# Patient Record
Sex: Male | Born: 1960 | Race: White | Hispanic: No | Marital: Married | Smoking: Former smoker
Health system: Southern US, Community
[De-identification: ages and names within clinical notes are randomized; demographics above are authoritative.]

## PROBLEM LIST (undated history)

## (undated) DIAGNOSIS — E781 Pure hyperglyceridemia: Secondary | ICD-10-CM

## (undated) DIAGNOSIS — N2889 Other specified disorders of kidney and ureter: Secondary | ICD-10-CM

## (undated) DIAGNOSIS — K219 Gastro-esophageal reflux disease without esophagitis: Secondary | ICD-10-CM

## (undated) DIAGNOSIS — R7303 Prediabetes: Secondary | ICD-10-CM

## (undated) DIAGNOSIS — R002 Palpitations: Secondary | ICD-10-CM

## (undated) DIAGNOSIS — I10 Essential (primary) hypertension: Secondary | ICD-10-CM

## (undated) DIAGNOSIS — I70213 Atherosclerosis of native arteries of extremities with intermittent claudication, bilateral legs: Secondary | ICD-10-CM

## (undated) DIAGNOSIS — E118 Type 2 diabetes mellitus with unspecified complications: Secondary | ICD-10-CM

## (undated) DIAGNOSIS — N200 Calculus of kidney: Secondary | ICD-10-CM

## (undated) DIAGNOSIS — E041 Nontoxic single thyroid nodule: Secondary | ICD-10-CM

## (undated) DIAGNOSIS — D649 Anemia, unspecified: Secondary | ICD-10-CM

## (undated) DIAGNOSIS — N189 Chronic kidney disease, unspecified: Secondary | ICD-10-CM

## (undated) DIAGNOSIS — C801 Malignant (primary) neoplasm, unspecified: Secondary | ICD-10-CM

## (undated) DIAGNOSIS — I739 Peripheral vascular disease, unspecified: Secondary | ICD-10-CM

## (undated) DIAGNOSIS — Z9889 Other specified postprocedural states: Secondary | ICD-10-CM

## (undated) DIAGNOSIS — N183 Chronic kidney disease, stage 3 unspecified: Secondary | ICD-10-CM

## (undated) DIAGNOSIS — J439 Emphysema, unspecified: Secondary | ICD-10-CM

## (undated) DIAGNOSIS — Z87442 Personal history of urinary calculi: Secondary | ICD-10-CM

## (undated) DIAGNOSIS — J449 Chronic obstructive pulmonary disease, unspecified: Secondary | ICD-10-CM

## (undated) DIAGNOSIS — N289 Disorder of kidney and ureter, unspecified: Secondary | ICD-10-CM

## (undated) DIAGNOSIS — I6523 Occlusion and stenosis of bilateral carotid arteries: Secondary | ICD-10-CM

## (undated) DIAGNOSIS — I499 Cardiac arrhythmia, unspecified: Secondary | ICD-10-CM

## (undated) DIAGNOSIS — I2510 Atherosclerotic heart disease of native coronary artery without angina pectoris: Secondary | ICD-10-CM

## (undated) HISTORY — PX: OTHER SURGICAL HISTORY: SHX169

## (undated) HISTORY — PX: HERNIA REPAIR: SHX51

## (undated) HISTORY — DX: Pure hyperglyceridemia: E78.1

## (undated) HISTORY — DX: Palpitations: R00.2

---

## 2006-10-14 ENCOUNTER — Emergency Department: Payer: Self-pay

## 2006-10-14 ENCOUNTER — Other Ambulatory Visit: Payer: Self-pay

## 2006-10-21 ENCOUNTER — Ambulatory Visit: Payer: Self-pay | Admitting: Cardiology

## 2006-10-27 ENCOUNTER — Ambulatory Visit: Payer: Self-pay

## 2006-11-09 ENCOUNTER — Ambulatory Visit: Payer: Self-pay

## 2006-11-26 ENCOUNTER — Ambulatory Visit: Payer: Self-pay | Admitting: Cardiology

## 2008-09-13 DIAGNOSIS — R079 Chest pain, unspecified: Secondary | ICD-10-CM | POA: Insufficient documentation

## 2008-09-13 DIAGNOSIS — E781 Pure hyperglyceridemia: Secondary | ICD-10-CM | POA: Insufficient documentation

## 2010-07-24 ENCOUNTER — Ambulatory Visit: Payer: Self-pay | Admitting: Gastroenterology

## 2010-07-27 LAB — PATHOLOGY REPORT

## 2010-08-19 NOTE — Assessment & Plan Note (Signed)
[facility] OFFICE NOTE   [name], [name]                    MRN:          [id number]  DATE:10/21/2006                            DOB:          01/29/1961    REFERRING PHYSICIAN:  Noelle McLaurin   REASON FOR CONSULTATION:  Evaluate a patient with chest discomfort and  multiple cardiovascular risk factors.   HISTORY OF PRESENT ILLNESS:  The patient is a pleasant 50 year old  gentleman with no prior cardiac history.  He said he had chest pain some  years ago and had a negative exercise treadmill test.  However, he has  been getting chest discomfort now over the last 2-3 weeks.  He describes  this as a discomfort that can be in his left chest, left shoulder or  left arm.  It happens frequently throughout the day.  It is about every  half hour or sometimes less frequently.  He describes it as a mild to  moderate discomfort.  It is somewhat of a heaviness.  It may last for a  few to several seconds.  It seems to be worse with physical activity  such as yard work.  He does have it at rest.  He does not have any  associated symptoms such as nausea, vomiting, or diaphoresis.  He has  not noticed any palpitations.  He did present to the [facility] Emergency  Room where cardiac enzymes were negative.  An EKG was nonacute.  Chest x-  ray was unremarkable.  He was referred here for further evaluation.  The  patient does not exercise.  He has been a long-standing smoker.  He does  do some yard work and has the symptoms as above.  He has not noticed any  change in this.  He has no new shortness of breath, though he has some  chronic dyspnea with moderate exertion.  He has no PND or orthopnea.   PAST MEDICAL HISTORY:  Borderline hypertension.  He has not been told he  has diabetes and he has no known dyslipidemia.   PAST SURGICAL HISTORY:  None.   ALLERGIES:  None.   MEDICATIONS:  Aspirin 325 mg daily.   SOCIAL  HISTORY:  He is from [place] former [employer] (medical).  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  He has been a cigarette smoker, a pack per day  for 30 years.  He does drink 3-4 alcoholic beverages per day and 5-6  caffeinated drinks.   FAMILY HISTORY:  Noncontributory for early coronary disease.  His mother  possibly had heart failure dying at age 68.  He does not know what his  father died from.  He has no brothers or sisters.   REVIEW OF SYSTEMS:  As stated in the HPI and otherwise negative for all  other systems.   PHYSICAL EXAMINATION:  GENERAL:  The patient is in no distress.  VITAL SIGNS:  Blood pressure 138/84, heart rate 68 and regular, weight  181 pounds.  HEENT:  Eyelids unremarkable.  Pupils are equal,  round, and reactive to  light.  Fundi not visualized.  Oral mucosa unremarkable.  NECK:  No jugular venous distention.  Wave form within normal limits.  Carotid upstroke brisk and symmetric.  No bruits.  No thyromegaly.  LYMPHATICS:  No cervical, axillary, or inguinal adenopathy.  LUNGS:  Clear to auscultation bilaterally.  BACK:  No costovertebral angle tenderness.  CHEST:  Unremarkable.  HEART:  PMI not displaced or sustained.  S1 and S2 within normal limits.  No S3, no S4, no clicks, no rubs, no murmurs.  ABDOMEN:  Flat.  Positive bowel sounds, normal in frequency and pitch.  No bruits.  No rebound.  No guarding.  No midline pulsatile mass.  No  hepatomegaly.  No splenomegaly.  SKIN:  No rashes.  No nodules.  EXTREMITIES:  Pulses 2+ throughout.  No edema.  No cyanosis.  No  clubbing.  NEUROLOGIC:  Oriented to person, place and time.  Cranial nerves II-XII  grossly intact.  Motor grossly intact.   EKG (done in the emergency room) sinus rhythm, rate 74, axis within  normal limits, intervals within normal limits, minimal voltage criteria  for left ventricular hypertrophy, no acute ST-T wave changes.    ASSESSMENT/PLAN:  1. Chest discomfort.  The patient's chest discomfort is somewhat      atypical.  However, it does get worse with exertion.  He has      significant cardiovascular risk factor with his smoking history and      sedentary lifestyle.  Given this, the pretest probability of      obstructive coronary artery disease is moderate at least.  We will      go ahead and do an exercise Cardiolite.  I had a long discussion      with him about the fact that he almost definitely has      nonobstructive plaque and would be at increased risk from the      Framingham score.  We are going to address this as below.  2. Risk reduction.  I will check a lipid profile, C reactive protein,      and a homocystine level.  We will treat this according to      guidelines and perhaps more aggressively based on a mutual      agreement.  3. Tobacco.  I discussed with him therapies to quit smoking.  He is      going to try nicotine gum.  We will give him the telephone number      for [facility].  4. Sedentary lifestyle.  I will be able to prescribe an exercise      regimen for him at future appointments and based on his results on      the stress perfusion study.  5. Followup.  I would like to see him back in about 6 months to see if      he has made any progress with risk reduction.    Rollene Rotunda, MD, [facility]  Electronically Signed   JH/MedQ  DD: 10/21/2006  DT: 10/21/2006  Job #: [id number]   cc:   Maurilio Lovely, Dr.

## 2010-08-19 NOTE — Assessment & Plan Note (Signed)
[facility]                            CARDIOLOGY OFFICE NOTE   [name] [name], [name] [name]                    MRN:          [id number]  DATE:11/26/2006                            DOB:          1960-10-01    PRIMARY CARE PHYSICIAN:  None.   REASON FOR PRESENTATION:  Evaluate patient with chest discomfort.   HISTORY OF PRESENT ILLNESS:  The patient is a 50 year old gentleman who  came in with chest discomfort as described in the July 17 note.  Following that, he did have a stress perfusion study which demonstrated  no evidence of ischemia or infarct.  However, his ejection fraction was  32%.  Subsequently we ordered an echocardiogram which suggested his EF  was 55% with no wall motion abnormalities and no significant valvular  abnormalities.  Since that time, the patient has had a resolution of his  chest discomfort.  He may get some fleeting episodes now and again, but  it is not as severe as it was at the time of his ER presentation.  He  has been mowing the lawn and doing yard work and not getting any of  these symptoms.  He did have his labs drawn and has significant  hypertriglyceridemia with an HDL of 45 and triglycerides of 431.  Total  cholesterol was 238.  His homocysteine level was upper limits of normal.  C-reactive protein was not elevated.   PAST MEDICAL HISTORY:  Borderline hypertension.   ALLERGIES:  None.   MEDICATIONS:  Aspirin.   REVIEW OF SYSTEMS:  As stated in the HPI, otherwise negative for other  systems.   PHYSICAL EXAMINATION:  GENERAL:  The patient is in no distress.  VITAL SIGNS:  Blood pressure 138/68, heart rate 68 and regular.  Weight  178 pounds.  NECK:  No jugular venous distention at 45 degrees. Carotid upstroke  brisk and symmetric.  No bruits, no thyromegaly.  LYMPHATICS:  No nodules.  LUNGS: Clear to auscultation bilaterally.  BACK:  No costovertebral angle tenderness.  CHEST:  Unremarkable.  HEART:  PMI not  displaced or sustained.  S1 and S2 within normal limits.  No S3, no S4, click, rubs, murmurs.  ABDOMEN:  Obese.  Positive bowel sounds, normal in frequency and pitch.  No bruits, rebound, guarding, midline pulsatile mass, or organomegaly.  SKIN:  No rashes.  EXTREMITIES:  2+ pulses, no edema.   ASSESSMENT AND PLAN:  1. Chest pain.  The patient had a negative stress perfusion study.      The symptoms are gone.  At this point, no further cardiovascular      testing is warranted.  He will continue with primary risk      reduction.  2. Tobacco.  We again talked about the need to stop smoking, and      hopefully he can comply with this.  3. Hypertriglyceridemia.  He is going to try diet first.  I have      advised him to get this checked in 6 more months if he can diet and      exercise.  4. Followup.  He can  come back to this clinic as needed.  He is      advised to get a primary care doctor.     Rollene Rotunda, MD, [facility]  Electronically Signed    JH/MedQ  DD: 11/26/2006  DT: 11/27/2006  Job #: [id number]

## 2010-09-22 ENCOUNTER — Encounter: Payer: Self-pay | Admitting: Cardiovascular Disease

## 2011-07-07 ENCOUNTER — Ambulatory Visit (INDEPENDENT_AMBULATORY_CARE_PROVIDER_SITE_OTHER): Payer: 59 | Admitting: Cardiovascular Disease

## 2011-07-07 ENCOUNTER — Encounter: Payer: Self-pay | Admitting: Cardiovascular Disease

## 2011-07-07 VITALS — BP 152/80 | HR 84 | Ht 72.0 in | Wt 185.4 lb

## 2011-07-07 DIAGNOSIS — Z79899 Other long term (current) drug therapy: Secondary | ICD-10-CM

## 2011-07-07 DIAGNOSIS — E781 Pure hyperglyceridemia: Secondary | ICD-10-CM

## 2011-07-07 DIAGNOSIS — R002 Palpitations: Secondary | ICD-10-CM

## 2011-07-07 DIAGNOSIS — I499 Cardiac arrhythmia, unspecified: Secondary | ICD-10-CM

## 2011-07-07 DIAGNOSIS — R0600 Dyspnea, unspecified: Secondary | ICD-10-CM

## 2011-07-07 DIAGNOSIS — R079 Chest pain, unspecified: Secondary | ICD-10-CM

## 2011-07-07 NOTE — Patient Instructions (Addendum)
Need to wear a 48 hour holter monitor today. Need to have CBC, CMP,TSH, HGA1C, Fasting Lipid profile. He will have drawn at [facility].  Treadmill stress test. Do not eat or drink anything 4 hours prior to your treadmill test.  Echo for dyspnea.  Follow up with Dr. Kirke Corin after above test.

## 2011-07-13 ENCOUNTER — Telehealth: Payer: Self-pay | Admitting: Cardiovascular Disease

## 2011-07-13 NOTE — Telephone Encounter (Signed)
Pt wants to know if he needs to have a BNP done. Also calling for monitor results.

## 2011-07-13 NOTE — Telephone Encounter (Signed)
Patient called no answer.LMTC. 

## 2011-07-14 ENCOUNTER — Encounter: Payer: Self-pay | Admitting: Cardiovascular Disease

## 2011-07-14 DIAGNOSIS — R002 Palpitations: Secondary | ICD-10-CM | POA: Insufficient documentation

## 2011-07-14 NOTE — Assessment & Plan Note (Signed)
A followup fasting lipid profile will be requested. I will also request a hemoglobin A1c level due to his history of high triglycerides.

## 2011-07-14 NOTE — Assessment & Plan Note (Signed)
The patient is having palpitations and mild tachycardia. His symptoms are suggestive of premature beats. That's also noted in his resting ECG. I discussed with him the importance of cutting down on caffeine and alcohol intake. I also advised him to quit smoking. I will obtain a 48-hour Holter monitor to evaluate for other arrhythmia.  I would also request routine labs that include CBC, CMP, and TSH. His blood pressure has been running high. I will consider treatment with a calcium channel blocker or beta blocker based on the results of the monitor.

## 2011-07-14 NOTE — Telephone Encounter (Signed)
Patient called no answer.LMTC. 

## 2011-07-14 NOTE — Progress Notes (Signed)
HPI  This is a 51 year old male who is here for evaluation of palpitations and chest pain. He actually holds an M.D. degree from [place] and currently works as a Technical sales engineer at [employer] he was evaluated in 2008 by Dr. wall for atypical chest pain. He underwent a nuclear stress test which showed no evidence of ischemia but his ejection fraction was noted to be 32%. He subsequently underwent an echocardiogram which showed normal ejection fraction. He was noted to have significant hyperlipidemia with mostly elevated triglyceride. He was treated with Lovaza. He is here today for evaluation of palpitations. He feels that his heart is skipping frequently with occasional mild tachycardia. He gets slightly dizzy with this but he has not had any syncope or presyncope. He gets soreness in his chest when his heart is skipping which last for a few minutes. He did notice increased exertional dyspnea. He consumes large amount of coffee on a daily basis as well as alcohol and continues to smoke.  No Known Allergies   Current Outpatient Prescriptions on File Prior to Visit  Medication Sig Dispense Refill  . lisinopril (PRINIVIL,ZESTRIL) 40 MG tablet Take 40 mg by mouth daily.      Marland Kitchen omega-3 acid ethyl esters (LOVAZA) 1 G capsule Take 2 g by mouth 2 (two) times daily.      Marland Kitchen omeprazole (PRILOSEC) 20 MG capsule Take 20 mg by mouth daily.         Past Medical History  Diagnosis Date  . Hypertriglyceridemia   . Chest pain     Unspecified     History reviewed. No pertinent past surgical history.   Family History  Problem Relation Age of Onset  . Heart failure       History   Social History  . Marital Status: Married    Spouse Name: N/A    Number of Children: N/A  . Years of Education: N/A   Occupational History  . Full Time    Social History Main Topics  . Smoking status: Smoker, Current Status Unknown  . Smokeless tobacco: Not on file  . Alcohol Use: Yes  . Drug Use: No  .  Sexually Active: Not on file   Other Topics Concern  . Not on file   Social History Narrative   Regular exercise: No     ROS Constitutional: Negative for fever, chills, diaphoresis, activity change, appetite change and fatigue.  HENT: Negative for hearing loss, nosebleeds, congestion, sore throat, facial swelling, drooling, trouble swallowing, neck pain, voice change, sinus pressure and tinnitus.  Eyes: Negative for photophobia, pain, discharge and visual disturbance.  Respiratory: Negative for apnea, cough  and wheezing.  Cardiovascular: Negative for  leg swelling.  Gastrointestinal: Negative for nausea, vomiting, abdominal pain, diarrhea, constipation, blood in stool and abdominal distention.  Genitourinary: Negative for dysuria, urgency, frequency, hematuria and decreased urine volume.  Musculoskeletal: Negative for myalgias, back pain, joint swelling, arthralgias and gait problem.  Skin: Negative for color change, pallor, rash and wound.  Neurological: Negative for dizziness, tremors, seizures, syncope, speech difficulty, weakness, , numbness and headaches.  Psychiatric/Behavioral: Negative for suicidal ideas, hallucinations, behavioral problems and agitation. The patient is not nervous/anxious.     PHYSICAL EXAM   BP 152/80  Pulse 84  Ht 6' (1.829 m)  Wt 185 lb 6.4 oz (84.097 kg)  BMI 25.14 kg/m2  Constitutional: He is oriented to person, place, and time. He appears well-developed and well-nourished. No distress.  HENT: No nasal discharge.  Head: Normocephalic and atraumatic.  Eyes: Pupils are equal and round. Right eye exhibits no discharge. Left eye exhibits no discharge.  Neck: Normal range of motion. Neck supple. No JVD present. No thyromegaly present.  Cardiovascular: Normal rate, regular rhythm, normal heart sounds. Exam reveals no gallop and no friction rub. No murmur heard.  Pulmonary/Chest: Effort normal and breath sounds normal. No stridor. No respiratory  distress. He has no wheezes. He has no rales. He exhibits no tenderness.  Abdominal: Soft. Bowel sounds are normal. He exhibits no distension. There is no tenderness. There is no rebound and no guarding.  Musculoskeletal: Normal range of motion. He exhibits no edema and no tenderness.  Neurological: He is alert and oriented to person, place, and time. Coordination normal.  Skin: Skin is warm and dry. No rash noted. He is not diaphoretic. No erythema. No pallor.  Psychiatric: He has a normal mood and affect. His behavior is normal. Judgment and thought content normal.      EKG: Normal sinus rhythm with PACs. No significant ST or T wave changes.   ASSESSMENT AND PLAN

## 2011-07-14 NOTE — Telephone Encounter (Signed)
Patient called back was told monitor results not ready.Patient stated always call him at his work # [phone].

## 2011-07-14 NOTE — Telephone Encounter (Signed)
Pt rtn call to cheryl, [phone] until 500p, and after 530p [phone]

## 2011-07-14 NOTE — Assessment & Plan Note (Signed)
The patient's chest pain is overall atypical and mostly associated with his palpitations. I recommend evaluation with a treadmill stress test. Also given his symptoms of dyspnea and palpitations, I recommend an echocardiogram.

## 2011-07-15 ENCOUNTER — Telehealth: Payer: Self-pay

## 2011-07-15 MED ORDER — METOPROLOL TARTRATE 25 MG PO TABS: 25.0000 mg | ORAL_TABLET | Freq: Two times a day (BID) | ORAL | Status: DC

## 2011-07-15 NOTE — Telephone Encounter (Signed)
Notified patient of holter monitor results. Told the patient per Dr. Kirke Corin monitor showed normal sinus rhythm, sinus tachycardia with very frequent PVC's. Told the patient we will send in a new Rx for metoprolol tart 25 mg take one tablet bid to help control this rhythm.

## 2011-07-22 ENCOUNTER — Encounter: Payer: Self-pay | Admitting: *Deleted

## 2011-07-22 ENCOUNTER — Other Ambulatory Visit: Payer: Self-pay | Admitting: Cardiovascular Disease

## 2011-07-22 LAB — CBC WITH DIFFERENTIAL/PLATELET
Basos: 0 % (ref 0–3)
Eos: 2 % (ref 0–7)
HCT: 39.6 % (ref 37.5–51.0)
Hemoglobin: 13.4 g/dL (ref 12.6–17.7)
Immature Granulocytes: 0 % (ref 0–2)
Lymphocytes Absolute: 2.4 10*3/uL (ref 0.7–4.5)
Monocytes: 11 % (ref 4–13)
Neutrophils Absolute: 4.2 10*3/uL (ref 1.8–7.8)
RBC: 3.96 x10E6/uL — ABNORMAL LOW (ref 4.14–5.80)
RDW: 13 % (ref 12.3–15.4)
WBC: 7.6 10*3/uL (ref 4.0–10.5)

## 2011-07-22 LAB — LIPID PANEL
Chol/HDL Ratio: 5.7 ratio units — ABNORMAL HIGH (ref 0.0–5.0)
HDL: 42 mg/dL (ref >39)
Triglycerides: 761 mg/dL (ref 0–149)

## 2011-07-22 LAB — COMPREHENSIVE METABOLIC PANEL
Albumin: 4.3 g/dL (ref 3.5–5.5)
Alkaline Phosphatase: 67 IU/L (ref 25–150)
BUN/Creatinine Ratio: 25 — ABNORMAL HIGH (ref 9–20)
BUN: 18 mg/dL (ref 6–24)
CO2: 22 mmol/L (ref 20–32)
Creatinine, Ser: 0.73 mg/dL — ABNORMAL LOW (ref 0.76–1.27)
GFR calc non Af Amer: 107 mL/min/{1.73_m2} (ref >59)
Globulin, Total: 2.5 g/dL (ref 1.5–4.5)

## 2011-07-23 ENCOUNTER — Ambulatory Visit (INDEPENDENT_AMBULATORY_CARE_PROVIDER_SITE_OTHER): Payer: 59 | Admitting: Cardiovascular Disease

## 2011-07-23 VITALS — Ht 72.0 in | Wt 170.0 lb

## 2011-07-23 DIAGNOSIS — I499 Cardiac arrhythmia, unspecified: Secondary | ICD-10-CM

## 2011-07-23 DIAGNOSIS — R0600 Dyspnea, unspecified: Secondary | ICD-10-CM

## 2011-07-23 NOTE — Progress Notes (Signed)
    Treadmill Stress test  Indication: Atypical chest pain.  Baseline Data:  Resting EKG shows NSR with rate of 75 bpm, no significant ST or T wave changes. Resting blood pressure of 120/60 mm Hg Stand bruce protocal was used.  Exercise Data:  Patient exercised for 7 min 0 sec,  Peak heart rate of 139 bpm.  This was a 83 % of the maximum predicted heart rate. No symptoms of chest pain or lightheadedness were reported at peak stress or in recovery. The patient complained of dyspnea. Peak Blood pressure recorded was 146/80 Maximal work level: 10.1 METs.  Heart rate at 3 minutes in recovery was 80 bpm. BP response: Normal HR response: Slightly blunted due to being on a beta blocker.  EKG with Exercise: Sinus tachycardia with no significant ST changes. Few PACs with exercise.  FINAL IMPRESSION: Normal exercise stress test. No significant EKG changes concerning for ischemia. Good exercise tolerance. Duke treadmill score of 7 which is low risk.

## 2011-08-04 ENCOUNTER — Other Ambulatory Visit (INDEPENDENT_AMBULATORY_CARE_PROVIDER_SITE_OTHER): Payer: 59

## 2011-08-04 DIAGNOSIS — R0600 Dyspnea, unspecified: Secondary | ICD-10-CM

## 2011-08-04 DIAGNOSIS — R0602 Shortness of breath: Secondary | ICD-10-CM

## 2011-08-06 ENCOUNTER — Encounter: Payer: Self-pay | Admitting: Cardiovascular Disease

## 2011-08-06 ENCOUNTER — Ambulatory Visit (INDEPENDENT_AMBULATORY_CARE_PROVIDER_SITE_OTHER): Payer: 59 | Admitting: Cardiovascular Disease

## 2011-08-06 VITALS — BP 125/85 | HR 99 | Ht 72.0 in | Wt 183.0 lb

## 2011-08-06 DIAGNOSIS — R002 Palpitations: Secondary | ICD-10-CM

## 2011-08-06 DIAGNOSIS — E785 Hyperlipidemia, unspecified: Secondary | ICD-10-CM

## 2011-08-06 DIAGNOSIS — E781 Pure hyperglyceridemia: Secondary | ICD-10-CM

## 2011-08-06 DIAGNOSIS — R079 Chest pain, unspecified: Secondary | ICD-10-CM

## 2011-08-06 MED ORDER — METOPROLOL SUCCINATE ER 100 MG PO TB24: 100.0000 mg | ORAL_TABLET | Freq: Every day | ORAL | Status: DC

## 2011-08-06 NOTE — Patient Instructions (Addendum)
Stop Metoprolol Tartrate Start Toprol XL 100 mg once daily.  Follow up in 3 months. Labs in 3 months.

## 2011-08-06 NOTE — Assessment & Plan Note (Signed)
His chest pain happens only when he is having palpitations. Treadmill stress test showed no evidence of ischemia. Duke treadmill score was low ( 7) .

## 2011-08-06 NOTE — Assessment & Plan Note (Signed)
His lipid profile showed a triglyceride of 750 with a total cholesterol of 240. I strongly advised him to start a statin. However, he is very hesitant about possible side effects. He did not tolerate Fibrates in the past due to abdominal discomfort. He wants to try lifestyle changes and exercise. His labs also showed a fasting glucose of 107 with a hemoglobin A1c of 5.8. He likely has glucose intolerance and metabolic syndrome. I will request a followup fasting lipid profile in 3 months from now.

## 2011-08-06 NOTE — Progress Notes (Signed)
HPI  This is a 51 year old male who is here for a followup visit regarding palpitations and chest pain. He was evaluated in 2008 by Dr. wall for atypical chest pain. He underwent a nuclear stress test which showed no evidence of ischemia but his ejection fraction was noted to be 32%. He subsequently underwent an echocardiogram which showed normal ejection fraction. He was noted to have significant hyperlipidemia with mostly elevated triglyceride. He was treated with Lovaza. He is here today for evaluation of palpitations. He feels that his heart is skipping frequently with occasional mild tachycardia. He gets slightly dizzy with this but he has not had any syncope or presyncope. He gets soreness in his chest when his heart is skipping which last for a few minutes. He did notice increased exertional dyspnea. He consumes large amount of coffee on a daily basis as well as alcohol and continues to smoke. During his last visit, I requested a 48-hour Holter monitor which showed very frequent PACs and sinus tachycardia without other significant arrhythmia. He was started on metoprolol 25 mg twice daily. His symptoms have improved although he still feels that his heart is going fast. He underwent a treadmill stress test which showed no evidence of ischemia. Echocardiogram showed normal LV systolic function with mild left ventricular hypertrophy.   No Known Allergies   Current Outpatient Prescriptions on File Prior to Visit  Medication Sig Dispense Refill  . lisinopril (PRINIVIL,ZESTRIL) 40 MG tablet Take 40 mg by mouth daily.      Marland Kitchen omega-3 acid ethyl esters (LOVAZA) 1 G capsule Take 4 g by mouth daily.       Marland Kitchen omeprazole (PRILOSEC) 20 MG capsule Take 20 mg by mouth daily.      . metoprolol succinate (TOPROL XL) 100 MG 24 hr tablet Take 1 tablet (100 mg total) by mouth daily. Take with or immediately following a meal.  30 tablet  6     Past Medical History  Diagnosis Date  . Hypertriglyceridemia     . Chest pain     Unspecified  . Palpitations      History reviewed. No pertinent past surgical history.   Family History  Problem Relation Age of Onset  . Heart failure       History   Social History  . Marital Status: Married    Spouse Name: N/A    Number of Children: N/A  . Years of Education: N/A   Occupational History  . Full Time    Social History Main Topics  . Smoking status: Current Everyday Smoker  . Smokeless tobacco: Not on file  . Alcohol Use: Yes  . Drug Use: No  . Sexually Active: Not on file   Other Topics Concern  . Not on file   Social History Narrative   Regular exercise: No      PHYSICAL EXAM   BP 125/85  Pulse 99  Ht 6' (1.829 m)  Wt 183 lb (83.008 kg)  BMI 24.82 kg/m2 Constitutional: He is oriented to person, place, and time. He appears well-developed and well-nourished. No distress.  HENT: No nasal discharge.  Head: Normocephalic and atraumatic.  Eyes: Pupils are equal and round. Right eye exhibits no discharge. Left eye exhibits no discharge.  Neck: Normal range of motion. Neck supple. No JVD present. No thyromegaly present.  Cardiovascular: Normal rate, regular rhythm, normal heart sounds and. Exam reveals no gallop and no friction rub. No murmur heard.  Pulmonary/Chest: Effort normal and breath sounds  normal. No stridor. No respiratory distress. He has no wheezes. He has no rales. He exhibits no tenderness.  Abdominal: Soft. Bowel sounds are normal. He exhibits no distension. There is no tenderness. There is no rebound and no guarding.  Musculoskeletal: Normal range of motion. He exhibits no edema and no tenderness.  Neurological: He is alert and oriented to person, place, and time. Coordination normal.  Skin: Skin is warm and dry. No rash noted. He is not diaphoretic. No erythema. No pallor.  Psychiatric: He has a normal mood and affect. His behavior is normal. Judgment and thought content normal.      ASSESSMENT AND  PLAN

## 2011-08-06 NOTE — Assessment & Plan Note (Signed)
Echo showed normal LV systolic function and was overall unremarkable. We'll to monitor showed very frequent PACs. His symptoms improved with metoprolol but still has not resolved.  He wants to change to once a day medication and thus we'll switch him to Toprol-XL 100 mg once daily. I again advised him to cut down on alcohol and caffeine intake. Smoking cessation is also advised.

## 2011-09-22 ENCOUNTER — Other Ambulatory Visit: Payer: Self-pay | Admitting: *Deleted

## 2011-09-22 MED ORDER — METOPROLOL SUCCINATE ER 100 MG PO TB24: 100.0000 mg | ORAL_TABLET | Freq: Every day | ORAL | Status: DC

## 2011-11-06 ENCOUNTER — Ambulatory Visit: Payer: 59 | Admitting: Cardiovascular Disease

## 2012-08-04 ENCOUNTER — Emergency Department: Payer: Self-pay | Admitting: Emergency Medicine

## 2012-08-07 ENCOUNTER — Emergency Department: Payer: Self-pay | Admitting: Emergency Medicine

## 2012-08-11 ENCOUNTER — Emergency Department: Payer: Self-pay | Admitting: Unknown Physician Specialty

## 2012-08-18 ENCOUNTER — Emergency Department: Payer: Self-pay | Admitting: Emergency Medicine

## 2013-03-16 ENCOUNTER — Ambulatory Visit: Payer: Self-pay | Admitting: Vascular Surgery

## 2013-03-16 LAB — CREATININE, SERUM
Creatinine: 0.99 mg/dL (ref 0.60–1.30)
EGFR (African American): >60

## 2013-09-03 DIAGNOSIS — N183 Chronic kidney disease, stage 3 unspecified: Secondary | ICD-10-CM | POA: Insufficient documentation

## 2013-09-03 DIAGNOSIS — I739 Peripheral vascular disease, unspecified: Secondary | ICD-10-CM | POA: Insufficient documentation

## 2013-09-03 DIAGNOSIS — I10 Essential (primary) hypertension: Secondary | ICD-10-CM | POA: Insufficient documentation

## 2013-09-03 DIAGNOSIS — I872 Venous insufficiency (chronic) (peripheral): Secondary | ICD-10-CM | POA: Insufficient documentation

## 2013-10-12 ENCOUNTER — Ambulatory Visit: Payer: Self-pay | Admitting: Vascular Surgery

## 2013-10-12 LAB — CREATININE, SERUM
CREATININE: 1.3 mg/dL (ref 0.60–1.30)
EGFR (African American): >60

## 2013-10-12 LAB — BUN: BUN: 26 mg/dL — AB (ref 7–18)

## 2014-01-13 DIAGNOSIS — Z87891 Personal history of nicotine dependence: Secondary | ICD-10-CM | POA: Insufficient documentation

## 2014-02-22 ENCOUNTER — Ambulatory Visit: Payer: Self-pay | Admitting: Gastroenterology

## 2014-04-06 HISTORY — PX: EXTRACORPOREAL SHOCK WAVE LITHOTRIPSY: SHX1557

## 2014-04-23 ENCOUNTER — Emergency Department: Payer: Self-pay | Admitting: Emergency Medicine

## 2014-04-23 LAB — URINALYSIS, COMPLETE
BILIRUBIN, UR: NEGATIVE
Bacteria: NONE SEEN
Glucose,UR: NEGATIVE mg/dL (ref 0–75)
Ketone: NEGATIVE
LEUKOCYTE ESTERASE: NEGATIVE
Nitrite: NEGATIVE
Ph: 5 (ref 4.5–8.0)
Protein: NEGATIVE
RBC,UR: 1725 /HPF (ref 0–5)
Specific Gravity: 1.014 (ref 1.003–1.030)
WBC UR: 5 /HPF (ref 0–5)

## 2014-04-23 LAB — COMPREHENSIVE METABOLIC PANEL
ALT: 67 U/L — AB
AST: 59 U/L — AB (ref 15–37)
Albumin: 3.5 g/dL (ref 3.4–5.0)
Alkaline Phosphatase: 88 U/L
Anion Gap: 9 (ref 7–16)
BUN: 30 mg/dL — AB (ref 7–18)
Bilirubin,Total: 0.4 mg/dL (ref 0.2–1.0)
Calcium, Total: 7.9 mg/dL — ABNORMAL LOW (ref 8.5–10.1)
Chloride: 106 mmol/L (ref 98–107)
Co2: 24 mmol/L (ref 21–32)
Creatinine: 1.41 mg/dL — ABNORMAL HIGH (ref 0.60–1.30)
EGFR (African American): >60
EGFR (Non-African Amer.): 55 — ABNORMAL LOW
Glucose: 138 mg/dL — ABNORMAL HIGH (ref 65–99)
Osmolality: 286 (ref 275–301)
POTASSIUM: 4.3 mmol/L (ref 3.5–5.1)
Sodium: 139 mmol/L (ref 136–145)
TOTAL PROTEIN: 7.3 g/dL (ref 6.4–8.2)

## 2014-04-23 LAB — CBC
HCT: 38.3 % — AB (ref 40.0–52.0)
HGB: 12.7 g/dL — ABNORMAL LOW (ref 13.0–18.0)
MCH: 34.9 pg — ABNORMAL HIGH (ref 26.0–34.0)
MCHC: 33.2 g/dL (ref 32.0–36.0)
MCV: 105 fL — ABNORMAL HIGH (ref 80–100)
Platelet: 167 10*3/uL (ref 150–440)
RBC: 3.64 10*6/uL — ABNORMAL LOW (ref 4.40–5.90)
RDW: 14.4 % (ref 11.5–14.5)
WBC: 6.3 10*3/uL (ref 3.8–10.6)

## 2014-04-23 LAB — CK: CK, Total: 381 U/L — ABNORMAL HIGH (ref 39–308)

## 2014-04-24 LAB — URINE CULTURE

## 2014-05-30 ENCOUNTER — Ambulatory Visit: Payer: Self-pay | Admitting: Urology

## 2014-07-27 NOTE — Op Note (Signed)
PATIENT NAME:  [name] [name], [name] [name] MR#:  [id number] DATE OF BIRTH:  1960-12-21  DATE OF PROCEDURE:  03/16/2013  PREOPERATIVE DIAGNOSES:  1.  Peripheral arterial disease with claudication, right lower extremity.  2.  Hypertension.   POSTOPERATIVE DIAGNOSES: 1.  Peripheral arterial disease with claudication, right lower extremity.  2.  Hypertension.   PROCEDURE: 1.  Ultrasound guidance for vascular access, left femoral artery.  2.  Catheter placement to right popliteal artery from left femoral approach.  3.  Aortogram and selective right lower extremity angiogram.  4.  Percutaneous transluminal angioplasty of mid to distal superficial femoral artery and above-knee popliteal artery with 5 mm diameter angioplasty balloon.  5.  Self-expanding stent placement for greater than 50% residual stenosis to distal left superficial femoral artery and above-knee popliteal artery with 6 mm diameter self-expanding stent.  6.  StarClose closure device, left femoral artery.   SURGEON: Algernon Huxley, M.D.   ANESTHESIA: Local with moderate conscious sedation.   ESTIMATED BLOOD LOSS: 25 mL.   INDICATION FOR PROCEDURE: This is a gentleman, who was referred to the office for claudication symptoms predominantly in the right lower extremity. His noninvasive study showed significant reduction in ABIs bilaterally, worse on the right with a right SFA occlusion on duplex. He is brought in for angiography for further evaluation and potential treatment. Risks and benefits were discussed. Informed consent was obtained.   DESCRIPTION OF PROCEDURE: The patient is brought to the vascular interventional radiology suite. Groins were shaved and prepped and a sterile surgical field was created. The left femoral head was localized with fluoroscopy. The left femoral artery was visualized with ultrasound and found to be patent. It was then accessed under direct ultrasound guidance without difficulty with a Seldinger needle and a J-wire  and 5-French sheath were then placed. Pigtail catheter was placed in the aorta at the L1 level and AP aortogram was performed. This showed 2 left renal arteries and a single right renal artery with no significant renal artery stenosis in any vessel. The aorta and iliac segments were widely patent. I crossed the aortic bifurcation and advanced to the right femoral head. Selective right lower extremity angiogram was then performed. This demonstrated an occlusion of the mid superficial femoral artery with reconstitution of the artery and above-knee popliteal artery. The patient was then systemically heparinized. A 6-French Ansell sheath was placed over a Terumo advantage wire. With the Terumo advantage wire and a Kumpe catheter, I was able to cross the occlusion without difficulty and confirm intraluminal flow in the popliteal artery below the knee.  The wire was then replaced. A 5 mm diameter angioplasty balloon was inflated from just above the knee in the popliteal artery to the mid superficial femoral artery, encompassing the occlusion. Waist was taken in multiple areas, which resolved with angioplasty. Completion angiogram following this showed dissection and greater than 50% residual stenosis, particularly in the superficial femoral portion of the lesion. A 6 mm diameter x 20 cm in length self-expanding stent was then placed encompassing the residual disease and post dilated with a 5 mm balloon. Completion angiogram following this showed the area to now be widely patent without significant residual stenosis and I elected to terminate the procedure. The sheath was pulled back to the ipsilateral external iliac artery and oblique arteriogram was performed. StarClose closure device was deployed in the usual fashion with excellent hemostatic result. The patient tolerated the procedure well and was taken to the recovery room in stable condition.   ____________________________  Algernon Huxley, MD jsd:aw D: 03/16/2013  12:40:45 ET T: 03/16/2013 13:31:41 ET JOB#: [id number]  cc: Algernon Huxley, MD, <Dictator> Algernon Huxley MD ELECTRONICALLY SIGNED 03/27/2013 10:13

## 2014-07-28 NOTE — Op Note (Signed)
PATIENT NAME:  [name] [name], MENDEZ MR#:  [id number] DATE OF BIRTH:  04-13-1960  DATE OF PROCEDURE:  10/12/2013  PREOPERATIVE DIAGNOSES:  1.  Peripheral arterial disease with claudication, which is lifestyle-limiting, right lower extremity.  2.  Previous right lower extremity intervention.  3.  Diabetes. 4.  Hypertension.   POSTOPERATIVE DIAGNOSES:  1.  Peripheral arterial disease with claudication, which is lifestyle-limiting, right lower extremity.  2.  Previous right lower extremity intervention.  3.  Diabetes. 4.  Hypertension.   PROCEDURES: 1.  Ultrasound guidance for vascular access, left femoral artery.  2.  Catheter placement to right popliteal artery from left femoral approach.  3.  Aortogram and selective right lower extremity angiogram.  4.  Percutaneous transluminal angioplasty with Lutonix drug-coated angioplasty balloon for in-stent stenosis of the right proximal above-knee popliteal artery and distal to mid SFA using 5-mm diameter x 10-cm Lutonix drug-coated angioplasty balloon.  5.  Percutaneous transluminal angioplasty of proximal to mid SFA with Lutonix drug-coated angioplasty balloon for stenosis above the previously-placed stent.  6.  Percutaneous transluminal angioplasty with Lutonix 6-mm diameter drug-coated angioplasty balloon to the proximal SFA and common femoral artery for origin stenosis of superficial femoral artery.  7.  Self-expanding stent placement using a 6-mm diameter x 10-cm length self-expanding stent in the proximal to mid SFA.  8.  StarClose closure device, left femoral artery.   SURGEON: Algernon Huxley, M.D.   ANESTHESIA: Local with moderate conscious sedation.   ESTIMATED BLOOD LOSS: 25 mL   CONTRAST USED: 75 mL Visipaque.   FLUOROSCOPY TIME: Approximately 5 minutes.   INDICATION FOR PROCEDURE: This is a gentleman who we have previously treated for peripheral vascular disease. He has recurrent claudication symptoms of the right lower extremity  with a drop in his ABI.  His noninvasive study showed multiple areas of stenosis both above and within his previously-placed stent and he is brought back for intervention treatment of these areas. Risks, benefits were discussed. Informed consent was obtained.   DESCRIPTION OF PROCEDURE: The patient is brought to the vascular suite. Groins were shaved and prepped and a sterile surgical field was created. The left femoral head was localized with fluoroscopy. Ultrasound was used to visualize a patent left femoral artery, it was accessed under direct ultrasound guidance without difficulty with a Seldinger needle. A J-wire and a 5 French sheath were placed. A pigtail catheter was placed in the aorta at the L1-L2 level and AP aortogram was performed. This showed no flow-limiting stenosis within the aortoiliac segments and the renal arteries were widely patent. I then crossed the aortic bifurcation and advanced to the right femoral head and selective right lower extremity angiogram was then performed. This showed about an 80% stenosis at the origin of the SFA, as well as the profunda femoris artery.   The vessel normalized over several centimeters and then there was a tapered stenosis in the proximal to mid SFA above the previously-placed stents that was in the 70-75% range. Within the stent there was diffuse intimal hyperplasia with near-occlusive stenosis in several areas. Below the stent there was about a 30-40% popliteal artery stenosis. There was then 2-vessel runoff distally.   The patient was systemically heparinized with 4000 units of intravenous heparin and a 6 [place] Ansell sheath was placed over a Terumo Advantage wire. Using the Advantage wire and Kumpe catheter I was able to easily cross all the lesions in the SFA and popliteal artery.  I then took a 5-mm diameter Lutonix drug-coated angioplasty  balloon from the above-knee popliteal artery and distal SFA stent in its distal portion. A second 5-mm  diameter x 10-cm length Lutonix drug-coated angioplasty balloon was inflated in the proximal portion was previously-placed stent up into the native proximal to mid SFA where there were areas of stenosis.   A 6-mm diameter Lutonix drug-coated angioplasty balloon was inflated for the separate and distinct stenosis at the origin of the SFA and a tight narrowing was seen there that resolved at about 8-10 atmospheres.  Completion angiogram showed markedly improved flow at the origin of the SFA with only about a 20% residual stenosis. The proximal to mid SFA above the previously-placed stent had a dissection and a stenosis in the 60% range but still appear flow-limiting. The in-stent stenosis that was treated with the Lutonix drug-coated angioplasty balloons appeared markedly improved and less than 20% residual stenosis was seen.   To treat the dissection and stenosis above the previously-placed an additional 6-mm diameter x  10-cm in length LifeStent was placed, post dilated with a 6-mm balloon with an excellent angiographic completion result and no significant residual stenosis in this location.   At this point, I elected to terminate the procedure. The sheath was pulled back to the ipsilateral external iliac artery and oblique arteriogram was performed. StarClose closure device was deployed in the usual fashion with excellent hemostatic result.   The patient tolerated the procedure well and was taken to the recovery room in stable condition   ____________________________ Algernon Huxley, MD jsd:lt D: 10/12/2013 10:30:10 ET T: 10/12/2013 11:08:07 ET JOB#: [id number]  cc: Algernon Huxley, MD, <Dictator> Algernon Huxley MD ELECTRONICALLY SIGNED 11/09/2013 12:39

## 2014-08-20 ENCOUNTER — Ambulatory Visit
Admission: RE | Admit: 2014-08-20 | Discharge: 2014-08-20 | Disposition: A | Discharge status: Home / Self Care | Payer: 59 | Source: Ambulatory Visit | Attending: Vascular Surgery | Admitting: Vascular Surgery

## 2014-08-20 ENCOUNTER — Encounter
Admission: RE | Disposition: A | Discharge status: Home / Self Care | Payer: 59 | Source: Ambulatory Visit | Attending: Vascular Surgery

## 2014-08-20 ENCOUNTER — Encounter: Payer: Self-pay | Admitting: *Deleted

## 2014-08-20 DIAGNOSIS — N189 Chronic kidney disease, unspecified: Secondary | ICD-10-CM | POA: Diagnosis not present

## 2014-08-20 DIAGNOSIS — E785 Hyperlipidemia, unspecified: Secondary | ICD-10-CM | POA: Diagnosis not present

## 2014-08-20 DIAGNOSIS — I6529 Occlusion and stenosis of unspecified carotid artery: Secondary | ICD-10-CM | POA: Insufficient documentation

## 2014-08-20 DIAGNOSIS — Z7982 Long term (current) use of aspirin: Secondary | ICD-10-CM | POA: Insufficient documentation

## 2014-08-20 DIAGNOSIS — I70213 Atherosclerosis of native arteries of extremities with intermittent claudication, bilateral legs: Secondary | ICD-10-CM | POA: Diagnosis not present

## 2014-08-20 DIAGNOSIS — R7309 Other abnormal glucose: Secondary | ICD-10-CM | POA: Insufficient documentation

## 2014-08-20 DIAGNOSIS — F17210 Nicotine dependence, cigarettes, uncomplicated: Secondary | ICD-10-CM | POA: Diagnosis not present

## 2014-08-20 DIAGNOSIS — K219 Gastro-esophageal reflux disease without esophagitis: Secondary | ICD-10-CM | POA: Diagnosis not present

## 2014-08-20 DIAGNOSIS — E119 Type 2 diabetes mellitus without complications: Secondary | ICD-10-CM | POA: Insufficient documentation

## 2014-08-20 DIAGNOSIS — I10 Essential (primary) hypertension: Secondary | ICD-10-CM | POA: Diagnosis not present

## 2014-08-20 DIAGNOSIS — Z79899 Other long term (current) drug therapy: Secondary | ICD-10-CM | POA: Insufficient documentation

## 2014-08-20 HISTORY — PX: PERIPHERAL VASCULAR CATHETERIZATION: SHX172C

## 2014-08-20 HISTORY — DX: Calculus of kidney: N20.0

## 2014-08-20 HISTORY — DX: Peripheral vascular disease, unspecified: I73.9

## 2014-08-20 HISTORY — DX: Chronic kidney disease, unspecified: N18.9

## 2014-08-20 HISTORY — DX: Essential (primary) hypertension: I10

## 2014-08-20 LAB — BUN: BUN: 17 mg/dL (ref 6–20)

## 2014-08-20 LAB — CREATININE, SERUM
Creatinine, Ser: 1.14 mg/dL (ref 0.61–1.24)
GFR calc Af Amer: >60 mL/min (ref >60)

## 2014-08-20 SURGERY — LOWER EXTREMITY ANGIOGRAPHY
Anesthesia: Moderate Sedation | Laterality: Right

## 2014-08-20 MED ORDER — MIDAZOLAM HCL 2 MG/2ML IJ SOLN
INTRAMUSCULAR | Status: DC | PRN
  Administered 2014-08-20: 3 mg via INTRAVENOUS
  Administered 2014-08-20 (×2): 2 mg via INTRAVENOUS

## 2014-08-20 MED ORDER — SODIUM CHLORIDE 0.9 % IV SOLN: INTRAVENOUS | Status: DC

## 2014-08-20 MED ORDER — IOHEXOL 300 MG/ML  SOLN
INTRAMUSCULAR | Status: DC | PRN
  Administered 2014-08-20: 100 mL via INTRAVENOUS

## 2014-08-20 MED ORDER — FENTANYL CITRATE (PF) 100 MCG/2ML IJ SOLN
INTRAMUSCULAR | Status: DC | PRN
  Administered 2014-08-20 (×3): 100 ug via INTRAVENOUS

## 2014-08-20 MED ORDER — CEFAZOLIN SODIUM 1-5 GM-% IV SOLN
1.0000 g | Freq: Once | INTRAVENOUS | Status: AC
  Administered 2014-08-20: 1 g via INTRAVENOUS

## 2014-08-20 MED ORDER — HEPARIN (PORCINE) IN NACL 2-0.9 UNIT/ML-% IJ SOLN
INTRAMUSCULAR | Status: AC
  Filled 2014-08-20: qty 1000

## 2014-08-20 MED ORDER — LIDOCAINE-EPINEPHRINE (PF) 1 %-1:200000 IJ SOLN
INTRAMUSCULAR | Status: AC
  Filled 2014-08-20: qty 30

## 2014-08-20 MED ORDER — HEPARIN SODIUM (PORCINE) 1000 UNIT/ML IJ SOLN
INTRAMUSCULAR | Status: DC | PRN
  Administered 2014-08-20: 4000 [IU] via INTRAVENOUS

## 2014-08-20 SURGICAL SUPPLY — 14 items
BALLN LUTONIX 6X150X130 (BALLOONS) ×6
BALLN LUTONIX DCB 5X60X130 (BALLOONS) ×3
BALLN LUTONIX DCB 6X100X130 (BALLOONS) ×3
CATH ROYAL FLUSH PIG 5F 70CM (CATHETERS) ×3
DEVICE PRESTO INFLATION (MISCELLANEOUS) ×3
DEVICE STARCLOSE SE CLOSURE (Vascular Products) ×3 IMPLANT
GLIDEWIRE ADV .035X260CM (WIRE) ×3
PACK ANGIOGRAPHY (CUSTOM PROCEDURE TRAY) ×3
SHEATH ANL2 6FRX45 HC (SHEATH) ×3
SHEATH BRITE TIP 5FRX11 (SHEATH) ×3
SHEATH HIGHFLEX ANSEL 6FRX55 (SHEATH) ×3
SYR MEDRAD MARK V 150ML (SYRINGE) ×3
TUBING CONTRAST HIGH PRESS 72 (TUBING) ×3
WIRE J 3MM .035X145CM (WIRE) ×3

## 2014-08-20 NOTE — H&P (Signed)
[facility] History & Physical Update  The patient was interviewed and re-examined.  The patient's previous History and Physical has been reviewed and is unchanged.  There is no change in the plan of care.  Jheri Mitter, MD  08/20/2014, 9:54 AM

## 2014-08-20 NOTE — Discharge Instructions (Signed)

## 2014-08-20 NOTE — Op Note (Signed)
[facility]  Percutaneous Study/Intervention Procedural Note   Date of Surgery: 08/20/2014 Surgeon(s):DEW,JASON  Assistants:none Pre-operative Diagnosis: PAD with claudication bilateral lower extremities right greater than left Post-operative diagnosis:  Same  Procedure(s) Performed:  1.  Ultrasound guidance for vascular access left femoral artery  2.  Catheter placement into the right SFA and right profunda femoris artery from left femoral approach  3.  Aortogram and selective right lower extremity angiogram  4.  Percutaneous transluminal angioplasty of right profunda femoris artery with 5 mm diameter Lutonix drug-coated angioplasty balloon  5.  Percutaneous transluminal angioplasty of right SFA both above and within the stent for recurrent stenosis with three 6 mm diameter Lutonix drug-coated angioplasty balloons  6.  StarClose closure device left femoral artery    Indications:  Patient is a 54 year old male with severe claudication symptoms which are become disabling on the right. He has undergone multiple previous interventions on the right. He has noninvasive study showing a significant drop in his perfusion and nearly occlusive stenosis within his stents. He is brought in for angiography for further evaluation and potential treatment.  Procedure:  The patient was identified and appropriate procedural time out was performed.  The patient was then placed supine on the table and prepped and draped in the usual sterile fashion.  Ultrasound was used to evaluate the left common femoral artery.  It was patent .  A digital ultrasound image was acquired.  A Seldinger needle was used to access the left common femoral artery under direct ultrasound guidance and a permanent image was performed.  A 0.035 J wire was advanced without resistance and a 5Fr sheath was placed.  Pigtail catheter was placed into the aorta and an AP aortogram was performed. This demonstrated normal renal  arteries and normal aorta and iliac segments without significant stenosis. I then cross the aortic bifurcation and advanced to the right femoral head. Selective right lower extremity angiogram was then performed. This demonstrated an occlusion of the right profunda femoris artery at its origin. The pigtail catheter was advanced into the proximal right SFA, and the right SFA was imaged and had multiple areas of stenosis of greater than 60% as well as a short segment occlusion within his previously placed stent in the distal SFA. He then had two-vessel runoff distally. The patient was systemically heparinized and a 6 [place] Ansell sheath was then placed. I then used a Kumpe catheter and the advantage wire to cross the profunda femoris artery occlusion without difficulty and confirm intraluminal flow. The wire was then replaced in the profunda femoris artery was treated with a 5 mm diameter by 6 cm length Lutonix drug-coated angioplasty balloon. There was about a 40% residual stenosis which I elected not to treat after angioplasty. I then turned my attention and recannulated the right superficial femoral artery with a Kumpe catheter.  I was able to cross the stenoses and occlusion within the right superficial femoral and popliteal artery and confirm intraluminal flow in the popliteal artery below the knee. I then replaced the wire. A 6 mm diameter by 15 cm length Lutonix drug-coated angioplasty balloon was inflated in the distal stent in the above-knee popliteal artery and distal SFA. A second 6 mm diameter by 15 cm length Lutonix drug-coated angioplasty balloon was inflated in the mid proximal SFA encompassing the stent.  For the stenosis proximal to the previously placed stent a third inflation with a 6 mm diameter by 10 cm length Lutonix drug-coated angioplasty balloon was  performed. There was a nonflow limiting dissection in the proximal SFA with only about a 30% residual stenosis. The high-grade stenosis in the  mid stent had about a 10% residual stenosis. The occlusion in the distal SFA and above-knee popliteal artery stent had about a 20% residual stenosis. None of these appeared flow limiting and the flow was quite brisk so I elected to terminate the procedure. The sheath was removed and StarClose closure device was deployed in the left femoral artery with excellent hemostatic result. The patient was taken to the recovery room in stable condition having tolerated the procedure well.  Findings:   Aortogram:  Normal aorta and iliac segments. The renal arteries.  Right Lower Extremity:  Occlusion of the profunda femoris artery. Diffuse stenosis of greater than 60% in the proximal SFA, 3 areas of stenosis within the previously placed stent at about 65-70% proximally, 90% mid, and short segment occlusion in the distal stent in the right SFA and popliteal artery. Two-vessel runoff distally.     Disposition: Patient was taken to the recovery room in stable condition having tolerated the procedure well.  DEW,JASON, MD 08/20/2014 12:24 PM

## 2014-08-22 ENCOUNTER — Encounter: Payer: Self-pay | Admitting: Vascular Surgery

## 2014-08-29 ENCOUNTER — Other Ambulatory Visit: Payer: Self-pay | Admitting: *Deleted

## 2014-08-29 ENCOUNTER — Encounter: Payer: Self-pay | Admitting: *Deleted

## 2014-08-29 DIAGNOSIS — K219 Gastro-esophageal reflux disease without esophagitis: Secondary | ICD-10-CM

## 2014-08-29 DIAGNOSIS — K759 Inflammatory liver disease, unspecified: Secondary | ICD-10-CM

## 2014-08-29 DIAGNOSIS — R310 Gross hematuria: Secondary | ICD-10-CM | POA: Insufficient documentation

## 2014-08-29 DIAGNOSIS — R972 Elevated prostate specific antigen [PSA]: Secondary | ICD-10-CM | POA: Insufficient documentation

## 2014-08-30 ENCOUNTER — Ambulatory Visit
Admission: RE | Admit: 2014-08-30 | Discharge: 2014-08-30 | Disposition: A | Discharge status: Home / Self Care | Payer: 59 | Source: Ambulatory Visit | Attending: Urology | Admitting: Urology

## 2014-08-30 ENCOUNTER — Other Ambulatory Visit: Payer: Self-pay | Admitting: Urology

## 2014-08-30 DIAGNOSIS — N200 Calculus of kidney: Secondary | ICD-10-CM

## 2014-09-07 ENCOUNTER — Encounter
Admission: RE | Admit: 2014-09-07 | Discharge: 2014-09-07 | Disposition: A | Discharge status: Home / Self Care | Payer: 59 | Source: Ambulatory Visit | Attending: Urology | Admitting: Urology

## 2014-09-07 DIAGNOSIS — Z01812 Encounter for preprocedural laboratory examination: Secondary | ICD-10-CM | POA: Insufficient documentation

## 2014-09-07 DIAGNOSIS — N200 Calculus of kidney: Secondary | ICD-10-CM | POA: Diagnosis not present

## 2014-09-13 ENCOUNTER — Encounter
Admission: RE | Disposition: A | Discharge status: Home / Self Care | Payer: Self-pay | Source: Ambulatory Visit | Attending: Urology

## 2014-09-13 ENCOUNTER — Ambulatory Visit
Admission: RE | Admit: 2014-09-13 | Discharge: 2014-09-13 | Disposition: A | Discharge status: Home / Self Care | Payer: 59 | Source: Ambulatory Visit | Attending: Urology | Admitting: Urology

## 2014-09-13 ENCOUNTER — Encounter: Payer: Self-pay | Admitting: *Deleted

## 2014-09-13 DIAGNOSIS — I739 Peripheral vascular disease, unspecified: Secondary | ICD-10-CM | POA: Diagnosis not present

## 2014-09-13 DIAGNOSIS — Z79899 Other long term (current) drug therapy: Secondary | ICD-10-CM | POA: Diagnosis not present

## 2014-09-13 DIAGNOSIS — I499 Cardiac arrhythmia, unspecified: Secondary | ICD-10-CM | POA: Insufficient documentation

## 2014-09-13 DIAGNOSIS — K219 Gastro-esophageal reflux disease without esophagitis: Secondary | ICD-10-CM | POA: Insufficient documentation

## 2014-09-13 DIAGNOSIS — N200 Calculus of kidney: Secondary | ICD-10-CM | POA: Diagnosis not present

## 2014-09-13 DIAGNOSIS — I10 Essential (primary) hypertension: Secondary | ICD-10-CM | POA: Diagnosis not present

## 2014-09-13 DIAGNOSIS — K759 Inflammatory liver disease, unspecified: Secondary | ICD-10-CM | POA: Diagnosis not present

## 2014-09-13 HISTORY — PX: EXTRACORPOREAL SHOCK WAVE LITHOTRIPSY: SHX1557

## 2014-09-13 SURGERY — LITHOTRIPSY, ESWL
Anesthesia: Moderate Sedation | Laterality: Left

## 2014-09-13 MED ORDER — ONDANSETRON HCL 4 MG/2ML IJ SOLN
4.0000 mg | Freq: Once | INTRAMUSCULAR | Status: AC
  Administered 2014-09-13: 4 mg via INTRAVENOUS

## 2014-09-13 MED ORDER — DIAZEPAM 5 MG PO TABS
10.0000 mg | ORAL_TABLET | ORAL | Status: AC
  Administered 2014-09-13: 10 mg via ORAL
  Filled 2014-09-13: qty 1

## 2014-09-13 MED ORDER — CIPROFLOXACIN HCL 500 MG PO TABS
500.0000 mg | ORAL_TABLET | ORAL | Status: AC
  Administered 2014-09-13: 500 mg via ORAL

## 2014-09-13 MED ORDER — ONDANSETRON HCL 4 MG/2ML IJ SOLN
INTRAMUSCULAR | Status: AC
  Administered 2014-09-13: 4 mg via INTRAVENOUS
  Filled 2014-09-13: qty 2

## 2014-09-13 MED ORDER — DIPHENHYDRAMINE HCL 25 MG PO CAPS
25.0000 mg | ORAL_CAPSULE | ORAL | Status: AC
  Administered 2014-09-13: 25 mg via ORAL

## 2014-09-13 MED ORDER — DIPHENHYDRAMINE HCL 25 MG PO CAPS
ORAL_CAPSULE | ORAL | Status: AC
  Administered 2014-09-13: 25 mg via ORAL
  Filled 2014-09-13: qty 1

## 2014-09-13 MED ORDER — DIAZEPAM 5 MG PO TABS
ORAL_TABLET | ORAL | Status: AC
  Administered 2014-09-13: 10 mg via ORAL
  Filled 2014-09-13: qty 1

## 2014-09-13 MED ORDER — HYDROCODONE-ACETAMINOPHEN 5-325 MG PO TABS: 1.0000 | ORAL_TABLET | Freq: Four times a day (QID) | ORAL | Status: DC | PRN

## 2014-09-13 MED ORDER — DEXTROSE-NACL 5-0.45 % IV SOLN
INTRAVENOUS | Status: DC
  Administered 2014-09-13: 08:00:00 via INTRAVENOUS

## 2014-09-13 MED ORDER — CIPROFLOXACIN HCL 500 MG PO TABS
ORAL_TABLET | ORAL | Status: AC
  Administered 2014-09-13: 500 mg via ORAL
  Filled 2014-09-13: qty 1

## 2014-09-13 MED ORDER — TAMSULOSIN HCL 0.4 MG PO CAPS: 0.4000 mg | ORAL_CAPSULE | Freq: Every day | ORAL | Status: DC

## 2014-09-13 NOTE — H&P (Signed)
See paper H&P 

## 2014-09-13 NOTE — Discharge Instructions (Signed)
See [facility] discharge instructions in chart.  You may restart aspirin and plavix tomorrow.

## 2014-10-12 ENCOUNTER — Ambulatory Visit (INDEPENDENT_AMBULATORY_CARE_PROVIDER_SITE_OTHER): Payer: 59 | Admitting: Urology

## 2014-10-12 ENCOUNTER — Ambulatory Visit
Admission: RE | Admit: 2014-10-12 | Discharge: 2014-10-12 | Disposition: A | Discharge status: Home / Self Care | Payer: 59 | Source: Ambulatory Visit | Attending: Urology | Admitting: Urology

## 2014-10-12 ENCOUNTER — Encounter: Payer: Self-pay | Admitting: Urology

## 2014-10-12 VITALS — BP 122/76 | HR 74 | Ht 72.0 in | Wt 202.5 lb

## 2014-10-12 DIAGNOSIS — Z9889 Other specified postprocedural states: Secondary | ICD-10-CM | POA: Diagnosis not present

## 2014-10-12 DIAGNOSIS — N200 Calculus of kidney: Secondary | ICD-10-CM | POA: Diagnosis not present

## 2014-10-12 DIAGNOSIS — R310 Gross hematuria: Secondary | ICD-10-CM | POA: Insufficient documentation

## 2014-10-12 NOTE — Progress Notes (Signed)
10/12/2014 3:06 PM   [name] [name] 16-Jan-1961 [id number]  Referring provider: Kirk Ruths, MD [place], [place] [id number]  Chief Complaint  Patient presents with  . Routine Post Op    lithotripsy x q mth ago, KUB results     HPI: Patient here for post-shockwave checked. No calculi seen on KUB. Pleased with this result. Follow-up is post shock wave litho-link urinalysis.     PMH: Past Medical History  Diagnosis Date  . Hypertriglyceridemia   . Chest pain     Unspecified  . Palpitations   . Hypertension   . Peripheral vascular disease   . Chronic kidney disease   . Kidney stone on left side     Surgical History: Past Surgical History  Procedure Laterality Date  . Lower extremity interventions x2 right leg Right   . Peripheral vascular catheterization Right 08/20/2014    Procedure: Lower Extremity Angiography;  Surgeon: Algernon Huxley, MD;  Location: [facility];  Service: Cardiovascular;  Laterality: Right;  . Peripheral vascular catheterization Right 08/20/2014    Procedure: Lower Extremity Intervention;  Surgeon: Algernon Huxley, MD;  Location: [facility];  Service: Cardiovascular;  Laterality: Right;  . Extracorporeal shock wave lithotripsy      Home Medications:    Medication List       This list is accurate as of: 10/12/14  3:06 PM.  Always use your most recent med list.               aspirin EC 81 MG tablet  Take 81 mg by mouth daily.     atorvastatin 40 MG tablet  Commonly known as:  LIPITOR  Take 40 mg by mouth daily.     clopidogrel 75 MG tablet  Commonly known as:  PLAVIX  Take 75 mg by mouth daily.     diltiazem 120 MG 24 hr capsule  Commonly known as:  CARDIZEM CD     HYDROcodone-acetaminophen 5-325 MG per tablet  Commonly known as:  NORCO/VICODIN  Take 1-2 tablets by mouth every 6 (six) hours as needed for moderate pain.     lisinopril-hydrochlorothiazide 20-12.5 MG per tablet  Commonly known  as:  PRINZIDE,ZESTORETIC  Take 1 tablet by mouth daily.     metoprolol 200 MG 24 hr tablet  Commonly known as:  TOPROL-XL  Take by mouth.     omega-3 acid ethyl esters 1 G capsule  Commonly known as:  LOVAZA  Take 4 g by mouth daily.     pantoprazole 40 MG tablet  Commonly known as:  PROTONIX  Take 40 mg by mouth daily.     tamsulosin 0.4 MG Caps capsule  Commonly known as:  FLOMAX  Take 1 capsule (0.4 mg total) by mouth daily.        Allergies:  Allergies  Allergen Reactions  . Fenofibrate Other (See Comments)    Family History: Family History  Problem Relation Age of Onset  . Heart failure      Social History:  reports that he quit smoking about 17 months ago. He does not have any smokeless tobacco history on file. He reports that he drinks about 8.4 oz of alcohol per week. He reports that he does not use illicit drugs.  ROS: Urological Symptom Review      Review of Systems UROLOGY Frequent Urination?: No Hard to postpone urination?: No Burning/pain with urination?: No Get up at night to urinate?: No Leakage of  urine?: No Urine stream starts and stops?: No Trouble starting stream?: No Do you have to strain to urinate?: No Blood in urine?: Yes Urinary tract infection?: No Sexually transmitted disease?: No Injury to kidneys or bladder?: No Painful intercourse?: No Weak stream?: No Erection problems?: No Penile pain?: No Gastrointestinal Nausea?: No Vomiting?: No Indigestion/heartburn?: No Diarrhea?: No Constipation?: No Constitutional Fever: No Night sweats?: No Weight loss?: No Fatigue?: No Skin Skin rash/lesions?: Yes Itching?: No Eyes Blurred vision?: No Double vision?: No Ears/Nose/Throat Sore throat?: No Sinus problems?: No Hematologic/Lymphatic Swollen glands?: No Easy bruising?: No Cardiovascular Leg swelling?: No Chest pain?: No Respiratory Cough?: No Shortness of breath?: No Endocrine Excessive thirst?:  No Musculoskeletal Back pain?: No Joint pain?: No Neurological Headaches?: No Dizziness?: No Psychologic Depression?: No Anxiety?: No   Physical Exam: BP 122/76 mmHg  Pulse 74  Ht 6' (1.829 m)  Wt 202 lb 8 oz (91.853 kg)  BMI 27.46 kg/m2  Constitutional:  Alert and oriented, No acute distress. HEENT: [place] AT, moist mucus membranes.  Trachea midline, no masses. Cardiovascular: No clubbing, cyanosis, or edema. Respiratory: Normal respiratory effort, no increased work of breathing. GI: Abdomen is soft, nontender, nondistended, no abdominal masses GU: No CVA tenderness.  Skin: No rashes, bruises or suspicious lesions. Lymph: No cervical or inguinal adenopathy. Neurologic: Grossly intact, no focal deficits, moving all 4 extremities. Psychiatric: Normal mood and affect.  Laboratory Data: Lab Results  Component Value Date   WBC 6.3 04/23/2014   HGB 12.7* 04/23/2014   HCT 38.3* 04/23/2014   MCV 105* 04/23/2014   PLT 167 04/23/2014    Lab Results  Component Value Date   CREATININE 1.14 08/20/2014    No results found for: PSA  No results found for: TESTOSTERONE  Lab Results  Component Value Date   HGBA1C 5.8* 07/22/2011    Urinalysis No results found for: COLORURINE, APPEARANCEUR, LABSPEC, [place], GLUCOSEU, HGBUR, BILIRUBINUR, KETONESUR, PROTEINUR, UROBILINOGEN, NITRITE, LEUKOCYTESUR  Pertinent Imaging: KUB was reviewed with the patient  Assessment & Plan:  Patient has no pain so I'm going to assume past most of his calculi and there are no obstructive calculi present. KUB was reviewed with the patient is negative for calculi. The patient will get a 24-hour litho-link calculus analysis. Follow-up is one month after 24-hour litho-link is done.  1. Renal calculi  - Abdomen 1 view (KUB); Future - Stone analysis   No Follow-up on file.  Collier Flowers, [facility] [address] [place], [place] [address] [phone]

## 2014-11-05 ENCOUNTER — Other Ambulatory Visit: Payer: Self-pay | Admitting: Urology

## 2014-12-14 ENCOUNTER — Encounter: Payer: Self-pay | Admitting: Urology

## 2015-01-10 DIAGNOSIS — E042 Nontoxic multinodular goiter: Secondary | ICD-10-CM | POA: Insufficient documentation

## 2015-05-23 DIAGNOSIS — D510 Vitamin B12 deficiency anemia due to intrinsic factor deficiency: Secondary | ICD-10-CM | POA: Insufficient documentation

## 2015-05-23 DIAGNOSIS — Z0000 Encounter for general adult medical examination without abnormal findings: Secondary | ICD-10-CM | POA: Insufficient documentation

## 2015-10-19 ENCOUNTER — Encounter: Payer: Self-pay | Admitting: Emergency Medicine

## 2015-10-19 ENCOUNTER — Inpatient Hospital Stay
Admission: EM | Admit: 2015-10-19 | Discharge: 2015-10-30 | DRG: 682 | Disposition: A | Discharge status: Home / Self Care | Payer: 59 | Attending: Internal Medicine | Admitting: Internal Medicine

## 2015-10-19 ENCOUNTER — Emergency Department: Payer: 59

## 2015-10-19 DIAGNOSIS — K3189 Other diseases of stomach and duodenum: Secondary | ICD-10-CM | POA: Diagnosis present

## 2015-10-19 DIAGNOSIS — E872 Acidosis: Secondary | ICD-10-CM | POA: Diagnosis present

## 2015-10-19 DIAGNOSIS — M25421 Effusion, right elbow: Secondary | ICD-10-CM

## 2015-10-19 DIAGNOSIS — R34 Anuria and oliguria: Secondary | ICD-10-CM | POA: Diagnosis present

## 2015-10-19 DIAGNOSIS — M79671 Pain in right foot: Secondary | ICD-10-CM

## 2015-10-19 DIAGNOSIS — M10021 Idiopathic gout, right elbow: Secondary | ICD-10-CM | POA: Diagnosis present

## 2015-10-19 DIAGNOSIS — I129 Hypertensive chronic kidney disease with stage 1 through stage 4 chronic kidney disease, or unspecified chronic kidney disease: Secondary | ICD-10-CM | POA: Diagnosis present

## 2015-10-19 DIAGNOSIS — E7800 Pure hypercholesterolemia, unspecified: Secondary | ICD-10-CM | POA: Diagnosis present

## 2015-10-19 DIAGNOSIS — Z87891 Personal history of nicotine dependence: Secondary | ICD-10-CM

## 2015-10-19 DIAGNOSIS — K209 Esophagitis, unspecified without bleeding: Secondary | ICD-10-CM | POA: Insufficient documentation

## 2015-10-19 DIAGNOSIS — N170 Acute kidney failure with tubular necrosis: Principal | ICD-10-CM | POA: Diagnosis present

## 2015-10-19 DIAGNOSIS — N179 Acute kidney failure, unspecified: Secondary | ICD-10-CM | POA: Diagnosis present

## 2015-10-19 DIAGNOSIS — L02415 Cutaneous abscess of right lower limb: Secondary | ICD-10-CM

## 2015-10-19 DIAGNOSIS — I959 Hypotension, unspecified: Secondary | ICD-10-CM | POA: Diagnosis present

## 2015-10-19 DIAGNOSIS — L03115 Cellulitis of right lower limb: Secondary | ICD-10-CM | POA: Diagnosis present

## 2015-10-19 DIAGNOSIS — N200 Calculus of kidney: Secondary | ICD-10-CM | POA: Diagnosis present

## 2015-10-19 DIAGNOSIS — K858 Other acute pancreatitis without necrosis or infection: Secondary | ICD-10-CM | POA: Insufficient documentation

## 2015-10-19 DIAGNOSIS — R0602 Shortness of breath: Secondary | ICD-10-CM

## 2015-10-19 DIAGNOSIS — E781 Pure hyperglyceridemia: Secondary | ICD-10-CM | POA: Diagnosis present

## 2015-10-19 DIAGNOSIS — R52 Pain, unspecified: Secondary | ICD-10-CM

## 2015-10-19 DIAGNOSIS — E785 Hyperlipidemia, unspecified: Secondary | ICD-10-CM | POA: Diagnosis present

## 2015-10-19 DIAGNOSIS — D649 Anemia, unspecified: Secondary | ICD-10-CM | POA: Diagnosis not present

## 2015-10-19 DIAGNOSIS — K859 Acute pancreatitis without necrosis or infection, unspecified: Secondary | ICD-10-CM | POA: Diagnosis present

## 2015-10-19 DIAGNOSIS — E876 Hypokalemia: Secondary | ICD-10-CM | POA: Diagnosis not present

## 2015-10-19 DIAGNOSIS — N400 Enlarged prostate without lower urinary tract symptoms: Secondary | ICD-10-CM | POA: Diagnosis present

## 2015-10-19 DIAGNOSIS — E875 Hyperkalemia: Secondary | ICD-10-CM | POA: Diagnosis present

## 2015-10-19 DIAGNOSIS — N189 Chronic kidney disease, unspecified: Secondary | ICD-10-CM | POA: Diagnosis present

## 2015-10-19 DIAGNOSIS — F1010 Alcohol abuse, uncomplicated: Secondary | ICD-10-CM | POA: Diagnosis present

## 2015-10-19 DIAGNOSIS — K766 Portal hypertension: Secondary | ICD-10-CM | POA: Insufficient documentation

## 2015-10-19 DIAGNOSIS — I739 Peripheral vascular disease, unspecified: Secondary | ICD-10-CM | POA: Diagnosis present

## 2015-10-19 DIAGNOSIS — D62 Acute posthemorrhagic anemia: Secondary | ICD-10-CM | POA: Diagnosis present

## 2015-10-19 DIAGNOSIS — M109 Gout, unspecified: Secondary | ICD-10-CM | POA: Diagnosis present

## 2015-10-19 DIAGNOSIS — K922 Gastrointestinal hemorrhage, unspecified: Secondary | ICD-10-CM | POA: Diagnosis present

## 2015-10-19 DIAGNOSIS — Z7902 Long term (current) use of antithrombotics/antiplatelets: Secondary | ICD-10-CM

## 2015-10-19 DIAGNOSIS — Z8249 Family history of ischemic heart disease and other diseases of the circulatory system: Secondary | ICD-10-CM

## 2015-10-19 DIAGNOSIS — Z7982 Long term (current) use of aspirin: Secondary | ICD-10-CM

## 2015-10-19 LAB — TROPONIN I: Troponin I: <0.03 ng/mL (ref <0.03)

## 2015-10-19 LAB — URINALYSIS COMPLETE WITH MICROSCOPIC (ARMC ONLY)
Bilirubin Urine: NEGATIVE
GLUCOSE, UA: 50 mg/dL — AB
Ketones, ur: NEGATIVE mg/dL
LEUKOCYTES UA: NEGATIVE
Nitrite: NEGATIVE
PH: 5 (ref 5.0–8.0)
Protein, ur: 30 mg/dL — AB
Specific Gravity, Urine: 1.008 (ref 1.005–1.030)

## 2015-10-19 LAB — CK: Total CK: 728 U/L — ABNORMAL HIGH (ref 49–397)

## 2015-10-19 LAB — COMPREHENSIVE METABOLIC PANEL
ALT: 51 U/L (ref 17–63)
ANION GAP: 20 — AB (ref 5–15)
AST: 13 U/L — AB (ref 15–41)
Albumin: 3.5 g/dL (ref 3.5–5.0)
Alkaline Phosphatase: 102 U/L (ref 38–126)
BILIRUBIN TOTAL: 0.7 mg/dL (ref 0.3–1.2)
BUN: 135 mg/dL — AB (ref 6–20)
CO2: 12 mmol/L — AB (ref 22–32)
Calcium: 7.5 mg/dL — ABNORMAL LOW (ref 8.9–10.3)
Chloride: 105 mmol/L (ref 101–111)
Creatinine, Ser: 18.38 mg/dL — ABNORMAL HIGH (ref 0.61–1.24)
GFR calc Af Amer: 3 mL/min — ABNORMAL LOW (ref >60)
GFR calc non Af Amer: 2 mL/min — ABNORMAL LOW (ref >60)
GLUCOSE: 96 mg/dL (ref 65–99)
POTASSIUM: 5.9 mmol/L — AB (ref 3.5–5.1)
SODIUM: 137 mmol/L (ref 135–145)
TOTAL PROTEIN: 7 g/dL (ref 6.5–8.1)

## 2015-10-19 LAB — BASIC METABOLIC PANEL
ANION GAP: 19 — AB (ref 5–15)
BUN: 132 mg/dL — ABNORMAL HIGH (ref 6–20)
CHLORIDE: 107 mmol/L (ref 101–111)
CO2: 12 mmol/L — ABNORMAL LOW (ref 22–32)
Calcium: 7.3 mg/dL — ABNORMAL LOW (ref 8.9–10.3)
Creatinine, Ser: 18.79 mg/dL — ABNORMAL HIGH (ref 0.61–1.24)
GFR, EST AFRICAN AMERICAN: 3 mL/min — AB (ref >60)
GFR, EST NON AFRICAN AMERICAN: 2 mL/min — AB (ref >60)
Glucose, Bld: 88 mg/dL (ref 65–99)
POTASSIUM: 6.8 mmol/L — AB (ref 3.5–5.1)
SODIUM: 138 mmol/L (ref 135–145)

## 2015-10-19 LAB — CBC WITH DIFFERENTIAL/PLATELET
BASOS ABS: 0 10*3/uL (ref 0–0.1)
Basophils Relative: 0 %
EOS ABS: 0.2 10*3/uL (ref 0–0.7)
Eosinophils Relative: 3 %
HEMATOCRIT: 22.3 % — AB (ref 40.0–52.0)
HEMOGLOBIN: 7.5 g/dL — AB (ref 13.0–18.0)
Lymphocytes Relative: 15 %
Lymphs Abs: 1 10*3/uL (ref 1.0–3.6)
MCH: 34.1 pg — ABNORMAL HIGH (ref 26.0–34.0)
MCHC: 33.7 g/dL (ref 32.0–36.0)
MCV: 101.2 fL — ABNORMAL HIGH (ref 80.0–100.0)
Monocytes Absolute: 0.8 10*3/uL (ref 0.2–1.0)
Monocytes Relative: 12 %
NEUTROS PCT: 70 %
Neutro Abs: 4.6 10*3/uL (ref 1.4–6.5)
Platelets: 181 10*3/uL (ref 150–440)
RBC: 2.21 MIL/uL — ABNORMAL LOW (ref 4.40–5.90)
RDW: 14.6 % — AB (ref 11.5–14.5)
WBC: 6.6 10*3/uL (ref 3.8–10.6)

## 2015-10-19 LAB — ABO/RH: ABO/RH(D): O POS

## 2015-10-19 LAB — PROTIME-INR
INR: 1.14
Prothrombin Time: 14.8 seconds (ref 11.4–15.0)

## 2015-10-19 LAB — PREPARE RBC (CROSSMATCH)

## 2015-10-19 LAB — HEMOGLOBIN A1C: Hgb A1c MFr Bld: 5.8 % (ref 4.0–6.0)

## 2015-10-19 LAB — LIPASE, BLOOD: LIPASE: 395 U/L — AB (ref 11–51)

## 2015-10-19 LAB — TSH: TSH: 1.51 u[IU]/mL (ref 0.350–4.500)

## 2015-10-19 MED ORDER — ACETAMINOPHEN 325 MG PO TABS
650.0000 mg | ORAL_TABLET | Freq: Four times a day (QID) | ORAL | Status: DC | PRN
Start: 2015-10-19 — End: 2015-10-30
  Administered 2015-10-25 (×2): 650 mg via ORAL
  Administered 2015-10-26: 325 mg via ORAL
  Administered 2015-10-28 – 2015-10-29 (×2): 650 mg via ORAL
  Filled 2015-10-19 (×5): qty 2
  Filled 2015-10-19: qty 1

## 2015-10-19 MED ORDER — SODIUM CHLORIDE 0.9 % IV SOLN
1.0000 g | Freq: Once | INTRAVENOUS | Status: AC
  Administered 2015-10-19: 1 g via INTRAVENOUS
  Filled 2015-10-19: qty 10

## 2015-10-19 MED ORDER — HYDROCHLOROTHIAZIDE 12.5 MG PO CAPS: 12.5000 mg | ORAL_CAPSULE | Freq: Every day | ORAL | Status: DC

## 2015-10-19 MED ORDER — OMEGA-3-ACID ETHYL ESTERS 1 G PO CAPS
4.0000 g | ORAL_CAPSULE | Freq: Every day | ORAL | Status: DC
  Administered 2015-10-19 – 2015-10-30 (×11): 4 g via ORAL
  Filled 2015-10-19 (×13): qty 4

## 2015-10-19 MED ORDER — ROSUVASTATIN CALCIUM 10 MG PO TABS
40.0000 mg | ORAL_TABLET | Freq: Every evening | ORAL | Status: DC
  Administered 2015-10-19 – 2015-10-29 (×11): 40 mg via ORAL
  Filled 2015-10-19 (×11): qty 4

## 2015-10-19 MED ORDER — OXYCODONE HCL 5 MG PO TABS: 5.0000 mg | ORAL_TABLET | ORAL | Status: DC | PRN

## 2015-10-19 MED ORDER — MORPHINE SULFATE (PF) 2 MG/ML IV SOLN
2.0000 mg | INTRAVENOUS | Status: DC | PRN
  Filled 2015-10-19: qty 1

## 2015-10-19 MED ORDER — ADULT MULTIVITAMIN W/MINERALS CH
1.0000 | ORAL_TABLET | Freq: Every day | ORAL | Status: DC
  Administered 2015-10-19 – 2015-10-30 (×10): 1 via ORAL
  Filled 2015-10-19 (×13): qty 1

## 2015-10-19 MED ORDER — DILTIAZEM HCL ER COATED BEADS 120 MG PO CP24
120.0000 mg | ORAL_CAPSULE | Freq: Every day | ORAL | Status: DC
  Administered 2015-10-19 – 2015-10-30 (×9): 120 mg via ORAL
  Filled 2015-10-19 (×11): qty 1

## 2015-10-19 MED ORDER — SODIUM POLYSTYRENE SULFONATE 15 GM/60ML PO SUSP
30.0000 g | Freq: Once | ORAL | Status: AC
  Administered 2015-10-19: 30 g via ORAL
  Filled 2015-10-19: qty 120

## 2015-10-19 MED ORDER — SODIUM BICARBONATE 650 MG PO TABS
1300.0000 mg | ORAL_TABLET | Freq: Two times a day (BID) | ORAL | Status: DC
  Administered 2015-10-19 – 2015-10-30 (×22): 1300 mg via ORAL
  Filled 2015-10-19 (×23): qty 2

## 2015-10-19 MED ORDER — VITAMIN B-1 100 MG PO TABS
100.0000 mg | ORAL_TABLET | Freq: Every day | ORAL | Status: DC
  Administered 2015-10-19 – 2015-10-30 (×10): 100 mg via ORAL
  Filled 2015-10-19 (×12): qty 1

## 2015-10-19 MED ORDER — VITAMIN B-12 1000 MCG PO TABS
2000.0000 ug | ORAL_TABLET | Freq: Every day | ORAL | Status: DC
  Administered 2015-10-19 – 2015-10-30 (×11): 2000 ug via ORAL
  Filled 2015-10-19 (×12): qty 2

## 2015-10-19 MED ORDER — DOCUSATE SODIUM 100 MG PO CAPS
100.0000 mg | ORAL_CAPSULE | Freq: Two times a day (BID) | ORAL | Status: DC
  Administered 2015-10-19 – 2015-10-28 (×9): 100 mg via ORAL
  Filled 2015-10-19 (×18): qty 1

## 2015-10-19 MED ORDER — FOLIC ACID 1 MG PO TABS
1.0000 mg | ORAL_TABLET | Freq: Every day | ORAL | Status: DC
  Administered 2015-10-19 – 2015-10-30 (×10): 1 mg via ORAL
  Filled 2015-10-19 (×11): qty 1

## 2015-10-19 MED ORDER — LISINOPRIL 20 MG PO TABS: 20.0000 mg | ORAL_TABLET | Freq: Every day | ORAL | Status: DC

## 2015-10-19 MED ORDER — PANTOPRAZOLE SODIUM 40 MG IV SOLR
40.0000 mg | Freq: Two times a day (BID) | INTRAVENOUS | Status: DC
  Administered 2015-10-19 – 2015-10-30 (×21): 40 mg via INTRAVENOUS
  Filled 2015-10-19 (×22): qty 40

## 2015-10-19 MED ORDER — ONDANSETRON HCL 4 MG/2ML IJ SOLN: 4.0000 mg | Freq: Four times a day (QID) | INTRAMUSCULAR | Status: DC | PRN

## 2015-10-19 MED ORDER — LORAZEPAM 1 MG PO TABS: 1.0000 mg | ORAL_TABLET | Freq: Four times a day (QID) | ORAL | Status: AC | PRN

## 2015-10-19 MED ORDER — METOPROLOL SUCCINATE ER 50 MG PO TB24
200.0000 mg | ORAL_TABLET | Freq: Every day | ORAL | Status: DC
  Administered 2015-10-19 – 2015-10-29 (×9): 200 mg via ORAL
  Filled 2015-10-19 (×11): qty 4

## 2015-10-19 MED ORDER — LISINOPRIL-HYDROCHLOROTHIAZIDE 20-12.5 MG PO TABS: 1.0000 | ORAL_TABLET | Freq: Every day | ORAL | Status: DC

## 2015-10-19 MED ORDER — LORAZEPAM 2 MG/ML IJ SOLN: 1.0000 mg | Freq: Four times a day (QID) | INTRAMUSCULAR | Status: AC | PRN

## 2015-10-19 MED ORDER — FAMOTIDINE IN NACL 20-0.9 MG/50ML-% IV SOLN: 20.0000 mg | Freq: Two times a day (BID) | INTRAVENOUS | Status: DC

## 2015-10-19 MED ORDER — TAMSULOSIN HCL 0.4 MG PO CAPS
0.4000 mg | ORAL_CAPSULE | Freq: Every day | ORAL | Status: DC
  Filled 2015-10-19 (×5): qty 1

## 2015-10-19 MED ORDER — TUBERCULIN PPD 5 UNIT/0.1ML ID SOLN
5.0000 [IU] | Freq: Once | INTRADERMAL | Status: AC
  Administered 2015-10-19: 5 [IU] via INTRADERMAL
  Filled 2015-10-19: qty 0.1

## 2015-10-19 MED ORDER — SODIUM CHLORIDE 0.9 % IV BOLUS (SEPSIS)
1000.0000 mL | Freq: Once | INTRAVENOUS | Status: AC
  Administered 2015-10-19: 1000 mL via INTRAVENOUS

## 2015-10-19 MED ORDER — ACETAMINOPHEN 650 MG RE SUPP
650.0000 mg | Freq: Four times a day (QID) | RECTAL | Status: DC | PRN
  Administered 2015-10-28: 650 mg via RECTAL
  Filled 2015-10-19: qty 1

## 2015-10-19 MED ORDER — CLOPIDOGREL BISULFATE 75 MG PO TABS
75.0000 mg | ORAL_TABLET | Freq: Every day | ORAL | Status: DC
  Administered 2015-10-19: 75 mg via ORAL
  Filled 2015-10-19: qty 1

## 2015-10-19 MED ORDER — SODIUM CHLORIDE 0.9 % IV SOLN
INTRAVENOUS | Status: DC
  Administered 2015-10-19 – 2015-10-26 (×12): via INTRAVENOUS

## 2015-10-19 MED ORDER — ONDANSETRON HCL 4 MG PO TABS: 4.0000 mg | ORAL_TABLET | Freq: Four times a day (QID) | ORAL | Status: DC | PRN

## 2015-10-19 MED ORDER — SODIUM CHLORIDE 0.9% FLUSH
3.0000 mL | Freq: Two times a day (BID) | INTRAVENOUS | Status: DC
  Administered 2015-10-19 – 2015-10-25 (×12): 3 mL via INTRAVENOUS
  Administered 2015-10-26: 10 mL via INTRAVENOUS
  Administered 2015-10-27 – 2015-10-30 (×6): 3 mL via INTRAVENOUS

## 2015-10-19 MED ORDER — THIAMINE HCL 100 MG/ML IJ SOLN: 100.0000 mg | Freq: Every day | INTRAMUSCULAR | Status: DC

## 2015-10-19 MED ORDER — ASPIRIN EC 81 MG PO TBEC
81.0000 mg | DELAYED_RELEASE_TABLET | Freq: Every day | ORAL | Status: DC
  Administered 2015-10-19: 81 mg via ORAL
  Filled 2015-10-19: qty 1

## 2015-10-19 MED ORDER — HEPARIN 1000 UNIT/ML FOR PERITONEAL DIALYSIS
5000.0000 [IU] | Freq: Once | INTRAMUSCULAR | Status: DC
  Filled 2015-10-19: qty 5

## 2015-10-19 MED ORDER — SODIUM CHLORIDE 0.9 % IV SOLN: 10.0000 mL/h | Freq: Once | INTRAVENOUS | Status: DC

## 2015-10-19 NOTE — Procedures (Signed)
Temporary Hemodialysis Catheter Insertion Procedure Note [name] [name] [id number] 1961-03-26  Procedure: Insertion of Temporary hemodialysis Catheter Indications: Acute renal failure  Procedure Details Consent: Risks of procedure as well as the alternatives and risks of each were explained to the (patient/caregiver).  Consent for procedure obtained. Time Out: Verified patient identification, verified procedure, site/side was marked, verified correct patient position, special equipment/implants available, medications/allergies/relevent history reviewed, required imaging and test results available.  Performed  Maximum sterile technique was used including antiseptics, cap, gloves, gown, hand hygiene, mask and sheet. Skin prep: Chlorhexidine; local anesthetic administered A antimicrobial bonded/coated double lumen 30cm hemodialysis catheter was placed in the left femoral vein using the Seldinger technique.  Evaluation Blood flow good Complications: No apparent complications Patient did tolerate procedure well. Lorenso Courier, Mie[place]ia A 10/19/2015, 8:15 PM

## 2015-10-19 NOTE — Consult Note (Signed)
[facility]  CONSULT NOTE    Date: 10/19/2015                  Patient Name:  [name] [name]  MRN: [id number]  DOB: 04-Mar-1961  Age / Sex: 55 y.o., male         PCP: Kirk Ruths., MD                 Service Requesting Consult: Dr. Manuella Ghazi                 Reason for Consult: Acute Renal Failure            History of Present Illness: [name] [name] is a 55 y.o. white male with EtOH abuse, pancreatitis, peripheral vascular disease, hypertension, hyperlipidemia, BPH, who was admitted to [facility] on 10/19/2015 for Pure hyperglyceridemia [E78.1] Hyperkalemia [E87.5] Peripheral blood vessel disorder ([place]) [I73.9] Acute renal failure, unspecified acute renal failure type ([place]) [N17.9] Anemia, unspecified anemia type [D64.9]  Patient's creatinine was 1 on 06/07/15. He was admitted with mailase, fatigue and weakness. He has been short of breath and having nausea. Creatinine on admission of 18 with hyperkalemia, metabolic acidosis and hypocalcemia.   Patient states that he thinks he has vasculitis.   Medications: Outpatient medications: Prescriptions prior to admission  Medication Sig Dispense Refill Last Dose  . aspirin EC 81 MG tablet Take 81 mg by mouth daily.   10/18/2015 at Unknown time  . clopidogrel (PLAVIX) 75 MG tablet Take 75 mg by mouth daily.   10/18/2015 at Unknown time  . diltiazem (CARDIZEM CD) 120 MG 24 hr capsule Take 120 mg by mouth daily.    10/18/2015 at Unknown time  . lisinopril-hydrochlorothiazide (PRINZIDE,ZESTORETIC) 20-12.5 MG per tablet Take 1 tablet by mouth daily.   10/18/2015 at Unknown time  . metoprolol (TOPROL-XL) 200 MG 24 hr tablet Take 200 mg by mouth daily.    10/18/2015 at Unknown time  . omega-3 acid ethyl esters (LOVAZA) 1 G capsule Take 4 g by mouth daily.    10/18/2015 at Unknown time  . pantoprazole (PROTONIX) 40 MG tablet Take 40 mg by mouth daily.   10/18/2015 at Unknown time  . rosuvastatin (CRESTOR) 40 MG tablet  Take 40 mg by mouth every evening.     . vitamin B-12 (CYANOCOBALAMIN) 1000 MCG tablet Take 2,000 mcg by mouth daily.   10/18/2015 at Unknown time  . HYDROcodone-acetaminophen (NORCO/VICODIN) 5-325 MG per tablet Take 1-2 tablets by mouth every 6 (six) hours as needed for moderate pain. (Patient not taking: Reported on 10/12/2014) 10 tablet 0 Not Taking at Unknown time  . tamsulosin (FLOMAX) 0.4 MG CAPS capsule Take 1 capsule (0.4 mg total) by mouth daily. (Patient not taking: Reported on 10/19/2015) 30 capsule 0 Not Taking at Unknown time    Current medications: Current Facility-Administered Medications  Medication Dose Route Frequency Provider Last Rate Last Dose  . 0.9 %  sodium chloride infusion  10 mL/hr Intravenous Once Paulette Blanch, MD      . 0.9 %  sodium chloride infusion   Intravenous Continuous Harrie Foreman, MD 125 mL/hr at 10/19/15 [phone]    . acetaminophen (TYLENOL) tablet 650 mg  650 mg Oral Q6H PRN Harrie Foreman, MD       Or  . acetaminophen (TYLENOL) suppository 650 mg  650 mg Rectal Q6H PRN Harrie Foreman, MD      . aspirin EC tablet 81 mg  81 mg  Oral Daily Harrie Foreman, MD   81 mg at 10/19/15 1052  . calcium gluconate 1 g in sodium chloride 0.9 % 100 mL IVPB  1 g Intravenous Once Harrie Foreman, MD   1 g at 10/19/15 1033  . clopidogrel (PLAVIX) tablet 75 mg  75 mg Oral Daily Harrie Foreman, MD   75 mg at 10/19/15 1053  . diltiazem (CARDIZEM CD) 24 hr capsule 120 mg  120 mg Oral Daily Harrie Foreman, MD   120 mg at 10/19/15 1051  . docusate sodium (COLACE) capsule 100 mg  100 mg Oral BID Harrie Foreman, MD   100 mg at 10/19/15 1000  . folic acid (FOLVITE) tablet 1 mg  1 mg Oral Daily Harrie Foreman, MD   1 mg at 10/19/15 1052  . LORazepam (ATIVAN) tablet 1 mg  1 mg Oral Q6H PRN Harrie Foreman, MD       Or  . LORazepam (ATIVAN) injection 1 mg  1 mg Intravenous Q6H PRN Harrie Foreman, MD      . metoprolol succinate (TOPROL-XL) 24 hr tablet 200 mg   200 mg Oral Daily Harrie Foreman, MD   200 mg at 10/19/15 1051  . morphine 2 MG/ML injection 2 mg  2 mg Intravenous Q3H PRN Harrie Foreman, MD      . multivitamin with minerals tablet 1 tablet  1 tablet Oral Daily Harrie Foreman, MD   1 tablet at 10/19/15 1051  . omega-3 acid ethyl esters (LOVAZA) capsule 4 g  4 g Oral Daily Harrie Foreman, MD   4 g at 10/19/15 1058  . ondansetron (ZOFRAN) tablet 4 mg  4 mg Oral Q6H PRN Harrie Foreman, MD       Or  . ondansetron [facility]) injection 4 mg  4 mg Intravenous Q6H PRN Harrie Foreman, MD      . oxyCODONE (Oxy IR/ROXICODONE) immediate release tablet 5 mg  5 mg Oral Q4H PRN Harrie Foreman, MD      . pantoprazole (PROTONIX) injection 40 mg  40 mg Intravenous Q12H Gladstone Lighter, MD   40 mg at 10/19/15 1055  . rosuvastatin (CRESTOR) tablet 40 mg  40 mg Oral QPM Harrie Foreman, MD      . sodium chloride flush (NS) 0.9 % injection 3 mL  3 mL Intravenous Q12H Harrie Foreman, MD   3 mL at 10/19/15 1054  . tamsulosin (FLOMAX) capsule 0.4 mg  0.4 mg Oral Daily Harrie Foreman, MD   0.4 mg at 10/19/15 1000  . thiamine (VITAMIN B-1) tablet 100 mg  100 mg Oral Daily Harrie Foreman, MD   100 mg at 10/19/15 1054   Or  . thiamine (B-1) injection 100 mg  100 mg Intravenous Daily Harrie Foreman, MD      . vitamin B-12 (CYANOCOBALAMIN) tablet 2,000 mcg  2,000 mcg Oral Daily Harrie Foreman, MD   2,000 mcg at 10/19/15 1050      Allergies: Allergies  Allergen Reactions  . Fenofibrate Other (See Comments)      Past Medical History: Past Medical History  Diagnosis Date  . Hypertriglyceridemia   . Chest pain     Unspecified  . Palpitations   . Hypertension   . Peripheral vascular disease ([place])   . Chronic kidney disease   . Kidney stone on left side      Past Surgical History: Past Surgical History  Procedure  Laterality Date  . Lower extremity interventions x2 right leg Right   . Peripheral vascular catheterization  Right 08/20/2014    Procedure: Lower Extremity Angiography;  Surgeon: Algernon Huxley, MD;  Location: [facility];  Service: Cardiovascular;  Laterality: Right;  . Peripheral vascular catheterization Right 08/20/2014    Procedure: Lower Extremity Intervention;  Surgeon: Algernon Huxley, MD;  Location: [facility];  Service: Cardiovascular;  Laterality: Right;  . Extracorporeal shock wave lithotripsy    . Extracorporeal shock wave lithotripsy Left 09/13/2014    Procedure: EXTRACORPOREAL SHOCK WAVE LITHOTRIPSY (ESWL);  Surgeon: Hollice Espy, MD;  Location: [facility];  Service: Urology;  Laterality: Left;     Family History: Family History  Problem Relation Age of Onset  . Heart failure       Social History: Social History   Social History  . Marital Status: Married    Spouse Name: N/A  . Number of Children: N/A  . Years of Education: N/A   Occupational History  . Full Time    Social History Main Topics  . Smoking status: Former Smoker -- 1.00 packs/day for 37 years    Quit date: 04/21/2013  . Smokeless tobacco: Not on file  . Alcohol Use: 8.4 oz/week    7 Glasses of wine, 7 Cans of beer per week  . Drug Use: No  . Sexual Activity: Not on file   Other Topics Concern  . Not on file   Social History Narrative   Regular exercise: No     Review of Systems: Review of Systems  Constitutional: Positive for weight loss and malaise/fatigue. Negative for fever, chills and diaphoresis.  HENT: Negative.  Negative for congestion, ear discharge, ear pain, hearing loss, nosebleeds, sore throat and tinnitus.   Eyes: Negative.  Negative for blurred vision, double vision, photophobia, pain, discharge and redness.  Respiratory: Positive for shortness of breath and wheezing. Negative for cough, hemoptysis, sputum production and stridor.   Cardiovascular: Negative.  Negative for chest pain, palpitations, orthopnea, claudication, leg swelling and PND.  Gastrointestinal: Positive  for abdominal pain. Negative for heartburn, nausea, vomiting, diarrhea, constipation, blood in stool and melena.  Genitourinary: Negative.  Negative for dysuria, urgency, frequency, hematuria and flank pain.  Musculoskeletal: Negative.  Negative for myalgias, back pain, joint pain, falls and neck pain.  Skin: Negative.  Negative for itching and rash.  Neurological: Positive for weakness. Negative for dizziness, tingling, tremors, sensory change, speech change, focal weakness, seizures, loss of consciousness and headaches.  Endo/Heme/Allergies: Negative for environmental allergies and polydipsia. Does not bruise/bleed easily.  Psychiatric/Behavioral: Negative.  Negative for depression, suicidal ideas, hallucinations, memory loss and substance abuse. The patient is not nervous/anxious and does not have insomnia.     Vital Signs: Blood pressure 133/73, pulse 81, temperature 97.5 F (36.4 C), temperature source Oral, resp. rate 30, height 6' (1.829 m), weight 85.503 kg (188 lb 8 oz), SpO2 100 %.  Weight trends: Filed Weights   10/19/15 0346 10/19/15 0758  Weight: 86.183 kg (190 lb) 85.503 kg (188 lb 8 oz)    Physical Exam: General: NAD, laying in bed.   Head: Normocephalic, atraumatic. Moist oral mucosal membranes  Eyes: Anicteric, PERRL  Neck: Supple, trachea midline  Lungs:  Clear to auscultation  Heart: Regular rate and rhythm  Abdomen:  Soft, nontender,   Extremities: No peripheral edema.  Neurologic: Nonfocal, moving all four extremities  Skin: No lesions        Lab results: Basic  Metabolic Panel:  Recent Labs Lab 10/19/15 0428  NA 137  K 5.9*  CL 105  CO2 12*  GLUCOSE 96  BUN 135*  CREATININE 18.38*  CALCIUM 7.5*    Liver Function Tests:  Recent Labs Lab 10/19/15 0428  AST 13*  ALT 51  ALKPHOS 102  BILITOT 0.7  PROT 7.0  ALBUMIN 3.5    Recent Labs Lab 10/19/15 0428  LIPASE 395*   No results for input(s): AMMONIA in the last 168  hours.  CBC:  Recent Labs Lab 10/19/15 0428  WBC 6.6  NEUTROABS 4.6  HGB 7.5*  HCT 22.3*  MCV 101.2*  PLT 181    Cardiac Enzymes:  Recent Labs Lab 10/19/15 0428  CKTOTAL 728*  TROPONINI <0.03    BNP: Invalid input(s): POCBNP  CBG: No results for input(s): GLUCAP in the last 168 hours.  Microbiology: Results for orders placed or performed in visit on 04/23/14  Urine culture     Status: None   Collection Time: 04/23/14  9:35 AM  Result Value Ref Range Status   Micro Text Report   Final       SOURCE: CLEAN CATCH    COMMENT                   NO GROWTH IN 18-24 HOURS   ANTIBIOTIC                                                        Coagulation Studies:  Recent Labs  10/19/15 0428  LABPROT 14.8  INR 1.14    Urinalysis:  Recent Labs  10/19/15 0735  COLORURINE STRAW*  LABSPEC 1.008  PHURINE 5.0  GLUCOSEU 50*  HGBUR 1+*  BILIRUBINUR NEGATIVE  KETONESUR NEGATIVE  PROTEINUR 30*  NITRITE NEGATIVE  LEUKOCYTESUR NEGATIVE      Imaging: Ct Renal Stone Study  10/19/2015  CLINICAL DATA:  Elevated creatinine and potassium. Decreased urine output last Friday. History of kidney stones. Bilateral flank pain. EXAM: CT ABDOMEN AND PELVIS WITHOUT CONTRAST TECHNIQUE: Multidetector CT imaging of the abdomen and pelvis was performed following the standard protocol without IV contrast. COMPARISON:  05/30/2014 FINDINGS: Lung bases are clear. Multiple stones demonstrated throughout the left kidney. Mild dilatation of left renal calices without significant renal pelvic dilatation. Stranding around the left kidney and renal sinus. No hydroureter or ureteral stone. Changes may represent sequela of recently passed stone or inflammatory process such as pyelonephritis. Mild stranding of the right kidney may also be inflammatory. No hydronephrosis or stone on the right. Exophytic lesions off of the right kidney probably represent cysts. Unenhanced appearance of the liver,  spleen, gallbladder, pancreas, adrenal glands, inferior vena cava, and retroperitoneal lymph nodes is unremarkable. Stomach, small bowel, and colon are not abnormally distended. No free air or free fluid in the abdomen. Pelvis: Mild bladder wall thickening may indicate cystitis. Prostate gland is enlarged. No free or loculated pelvic fluid collections. No pelvic mass or lymphadenopathy. Stranding along the left pericolic gutter extending down from the kidney. Diverticulosis of sigmoid colon without evidence of diverticulitis. Appendix is normal. Degenerative changes in the spine and hips. No destructive bone lesions. IMPRESSION: Multiple stones in the left kidney with mild Kayla seal dilatation. No ureteral stone and no hydroureter. Stranding around the kidneys and extending along the pericolic gutters bilaterally  but greater on the left. This may indicate pyelonephritis or sequela of recently passed stones. Mild bladder wall thickening suggesting cystitis. Prostate gland is enlarged. Electronically Signed   By: Lucienne Capers M.D.   On: 10/19/2015 04:51      Assessment & Plan: [name] [name] is a 55 y.o. white male with EtOH abuse, pancreatitis, peripheral vascular disease, hypertension, hyperlipidemia, BPH, who was admitted to [facility] on 10/19/2015   1. Acute Renal Failure: baseline creatinine of 1 on 06/07/15 2. Hyperkalemia 3. Metabolic acidosis 4. Anemia with renal failure: 2 units PRBC ordered  5. Hypocalcemia  Plan Acute renal failure with no apparent obstructive uropathy. Patient thinks he has vasculitis. Urine studies do show hematuria in 04/2014 however history of renal stones.  - Start sodium bicarbonate for metabolic acidosis - Continue IV fluids: NS - Check serologic work up: ANA, ANCA, anti-GBM, serum complements, hepatitis panel.  - Strict I&os.  - Discussed dialysis with patient in detail.    LOS: 0 Nyra Anspaugh 7/15/201711:23 AM

## 2015-10-19 NOTE — Progress Notes (Signed)
PRE DIALYSIS ASSESSMENT 

## 2015-10-19 NOTE — H&P (Signed)
[name] [name] is an 55 y.o. male.   Chief Complaint: Abnormal labs HPI: The patient with past medical history of peripheral artery disease and hepatitis presents emergency department on the request of his primary care doctor after reviewing labs drawn as an outpatient. The patient was noted to have a creatinine of 18 and advised to come to the emergency department for evaluation. He states that he has been feeling generalized malaise for at least 2 weeks. In the emergency Department laboratory evaluation also revealed significant anemia with a hemoglobin of 7. The patient denies chest pain or shortness of breath but admits to abdominal pain and occasional nausea. 2 units of packed red blood cells were ordered for transfusion in the hospitalist service was called for admission.  Past Medical History  Diagnosis Date  . Hypertriglyceridemia   . Chest pain     Unspecified  . Palpitations   . Hypertension   . Peripheral vascular disease ([place])   . Chronic kidney disease   . Kidney stone on left side     Past Surgical History  Procedure Laterality Date  . Lower extremity interventions x2 right leg Right   . Peripheral vascular catheterization Right 08/20/2014    Procedure: Lower Extremity Angiography;  Surgeon: Algernon Huxley, MD;  Location: [place];  Service: Cardiovascular;  Laterality: Right;  . Peripheral vascular catheterization Right 08/20/2014    Procedure: Lower Extremity Intervention;  Surgeon: Algernon Huxley, MD;  Location: [place] [facility];  Service: Cardiovascular;  Laterality: Right;  . Extracorporeal shock wave lithotripsy    . Extracorporeal shock wave lithotripsy Left 09/13/2014    Procedure: EXTRACORPOREAL SHOCK WAVE LITHOTRIPSY (ESWL);  Surgeon: Hollice Espy, MD;  Location: [facility];  Service: Urology;  Laterality: Left;    Family History  Problem Relation Age of Onset  . Heart failure     Social History:  reports that he quit smoking about 2 years ago. He  does not have any smokeless tobacco history on file. He reports that he drinks about 8.4 oz of alcohol per week. He reports that he does not use illicit drugs.  Allergies:  Allergies  Allergen Reactions  . Fenofibrate Other (See Comments)     (Not in a hospital admission)  Results for orders placed or performed during the hospital encounter of 10/19/15 (from the past 48 hour(s))  CBC with Differential     Status: Abnormal   Collection Time: 10/19/15  4:28 AM  Result Value Ref Range   WBC 6.6 3.8 - 10.6 K/uL   RBC 2.21 (L) 4.40 - 5.90 MIL/uL   Hemoglobin 7.5 (L) 13.0 - 18.0 g/dL   HCT 22.3 (L) 40.0 - 52.0 %   MCV 101.2 (H) 80.0 - 100.0 fL   MCH 34.1 (H) 26.0 - 34.0 pg   MCHC 33.7 32.0 - 36.0 g/dL   RDW 14.6 (H) 11.5 - 14.5 %   Platelets 181 150 - 440 K/uL   Neutrophils Relative % 70 %   Neutro Abs 4.6 1.4 - 6.5 K/uL   Lymphocytes Relative 15 %   Lymphs Abs 1.0 1.0 - 3.6 K/uL   Monocytes Relative 12 %   Monocytes Absolute 0.8 0.2 - 1.0 K/uL   Eosinophils Relative 3 %   Eosinophils Absolute 0.2 0 - 0.7 K/uL   Basophils Relative 0 %   Basophils Absolute 0.0 0 - 0.1 K/uL  Comprehensive metabolic panel     Status: Abnormal   Collection Time: 10/19/15  4:28  AM  Result Value Ref Range   Sodium 137 135 - 145 mmol/L   Potassium 5.9 (H) 3.5 - 5.1 mmol/L   Chloride 105 101 - 111 mmol/L   CO2 12 (L) 22 - 32 mmol/L   Glucose, Bld 96 65 - 99 mg/dL   BUN 135 (H) 6 - 20 mg/dL    Comment: RESULT CONFIRMED BY MANUAL DILUTION   Creatinine, Ser 18.38 (H) 0.61 - 1.24 mg/dL   Calcium 7.5 (L) 8.9 - 10.3 mg/dL   Total Protein 7.0 6.5 - 8.1 g/dL   Albumin 3.5 3.5 - 5.0 g/dL   AST 13 (L) 15 - 41 U/L   ALT 51 17 - 63 U/L   Alkaline Phosphatase 102 38 - 126 U/L   Total Bilirubin 0.7 0.3 - 1.2 mg/dL   GFR calc non Af Amer 2 (L) >60 mL/min   GFR calc Af Amer 3 (L) >60 mL/min    Comment: (NOTE) The eGFR has been calculated using the CKD EPI equation. This calculation has not been validated in  all clinical situations. eGFR's persistently <60 mL/min signify possible Chronic Kidney Disease.    Anion gap 20 (H) 5 - 15  Troponin I     Status: None   Collection Time: 10/19/15  4:28 AM  Result Value Ref Range   Troponin I <0.03 <0.03 ng/mL  Lipase, blood     Status: Abnormal   Collection Time: 10/19/15  4:28 AM  Result Value Ref Range   Lipase 395 (H) 11 - 51 U/L    Comment: RESULT CONFIRMED BY MANUAL DILUTION  CK     Status: Abnormal   Collection Time: 10/19/15  4:28 AM  Result Value Ref Range   Total CK 728 (H) 49 - 397 U/L  Protime-INR     Status: None   Collection Time: 10/19/15  4:28 AM  Result Value Ref Range   Prothrombin Time 14.8 11.4 - 15.0 seconds   INR 1.14   Type and screen [facility]     Status: None (Preliminary result)   Collection Time: 10/19/15  5:03 AM  Result Value Ref Range   ABO/RH(D) O POS    Antibody Screen NEG    Sample Expiration 10/22/2015    Unit Number [id number]    Blood Component Type RBC LR PHER1    Unit division 00    Status of Unit ALLOCATED    Transfusion Status OK TO TRANSFUSE    Crossmatch Result Compatible    Unit Number [id number]    Blood Component Type RBC LR PHER1    Unit division 00    Status of Unit ALLOCATED    Transfusion Status OK TO TRANSFUSE    Crossmatch Result Compatible   Prepare RBC     Status: None   Collection Time: 10/19/15  5:03 AM  Result Value Ref Range   Order Confirmation ORDER PROCESSED BY BLOOD BANK   ABO/Rh     Status: None   Collection Time: 10/19/15  5:40 AM  Result Value Ref Range   ABO/RH(D) O POS    Ct Renal Stone Study  10/19/2015  CLINICAL DATA:  Elevated creatinine and potassium. Decreased urine output last Friday. History of kidney stones. Bilateral flank pain. EXAM: CT ABDOMEN AND PELVIS WITHOUT CONTRAST TECHNIQUE: Multidetector CT imaging of the abdomen and pelvis was performed following the standard protocol without IV contrast. COMPARISON:  05/30/2014  FINDINGS: Lung bases are clear. Multiple stones demonstrated throughout the left kidney. Mild dilatation  of left renal calices without significant renal pelvic dilatation. Stranding around the left kidney and renal sinus. No hydroureter or ureteral stone. Changes may represent sequela of recently passed stone or inflammatory process such as pyelonephritis. Mild stranding of the right kidney may also be inflammatory. No hydronephrosis or stone on the right. Exophytic lesions off of the right kidney probably represent cysts. Unenhanced appearance of the liver, spleen, gallbladder, pancreas, adrenal glands, inferior vena cava, and retroperitoneal lymph nodes is unremarkable. Stomach, small bowel, and colon are not abnormally distended. No free air or free fluid in the abdomen. Pelvis: Mild bladder wall thickening may indicate cystitis. Prostate gland is enlarged. No free or loculated pelvic fluid collections. No pelvic mass or lymphadenopathy. Stranding along the left pericolic gutter extending down from the kidney. Diverticulosis of sigmoid colon without evidence of diverticulitis. Appendix is normal. Degenerative changes in the spine and hips. No destructive bone lesions. IMPRESSION: Multiple stones in the left kidney with mild Kayla seal dilatation. No ureteral stone and no hydroureter. Stranding around the kidneys and extending along the pericolic gutters bilaterally but greater on the left. This may indicate pyelonephritis or sequela of recently passed stones. Mild bladder wall thickening suggesting cystitis. Prostate gland is enlarged. Electronically Signed   By: Lucienne Capers M.D.   On: 10/19/2015 04:51    Review of Systems  Constitutional: Negative for fever and chills.  HENT: Negative for sore throat and tinnitus.   Eyes: Negative for blurred vision and redness.  Respiratory: Negative for cough and shortness of breath.   Cardiovascular: Negative for chest pain, palpitations, orthopnea and PND.   Gastrointestinal: Positive for abdominal pain. Negative for nausea, vomiting and diarrhea.  Genitourinary: Negative for dysuria, urgency and frequency.  Musculoskeletal: Negative for myalgias and joint pain.  Skin: Negative for rash.       No lesions  Neurological: Negative for speech change, focal weakness and weakness.  Endo/Heme/Allergies: Does not bruise/bleed easily.       No temperature intolerance  Psychiatric/Behavioral: Negative for depression and suicidal ideas.    Blood pressure 129/79, pulse 88, temperature 98.2 F (36.8 C), temperature source Oral, resp. rate 30, height 6' (1.829 m), weight 86.183 kg (190 lb), SpO2 96 %. Physical Exam  Constitutional: He is oriented to person, place, and time. He appears well-developed and well-nourished. No distress.  HENT:  Head: Normocephalic and atraumatic.  Mouth/Throat: Oropharynx is clear and moist.  Eyes: Conjunctivae and EOM are normal. Pupils are equal, round, and reactive to light.  Neck: Normal range of motion. Neck supple. No JVD present. No tracheal deviation present. No thyromegaly present.  Cardiovascular: Normal rate, regular rhythm and normal heart sounds.  Exam reveals no gallop and no friction rub.   No murmur heard. Respiratory: Effort normal and breath sounds normal. No respiratory distress.  GI: Soft. Bowel sounds are normal. He exhibits no distension and no abdominal bruit. There is no hepatosplenomegaly. There is no tenderness.  Genitourinary:  Deferred  Musculoskeletal: Normal range of motion. He exhibits no edema.  Lymphadenopathy:    He has no cervical adenopathy.  Neurological: He is alert and oriented to person, place, and time. No cranial nerve deficit.  Skin: Skin is warm and dry. No rash noted. No erythema.  Psychiatric: He has a normal mood and affect. His behavior is normal. Judgment and thought content normal.     Assessment/Plan This is a 55 year old male admitted for acute renal failure and  pancreatitis. 1. Acute renal failure: Prerenal; secondary to anemia  due to chronic GI losses and likely poor bone marrow response secondary to alcohol. The patient admits that he barely produce any urine 2 days ago but is now making small amounts of completely clear urine. Potassium is mildly elevated but the patient has no EKG changes. I started the patient on aggressive IV hydration and consult nephrology. No parenchymal kidney disease seen on CT of the abdomen. May need dialysis. 2. Pancreatitis: Likely secondary to chronic alcohol abuse. The patient is nothing by mouth for now. We'll manage his pain and continue to hydrate with intravenous fluid. BUN is very high but calcium is low. White blood cell count normal. The patient is afebrile. 3. GI bleed: Chronic; Hemoccult weakly positive. I have consulted gastroenterology for possible upper endoscopy. The patient reports normal colonoscopies which are up-to-date. IV pantoprazole. 4. Peripheral artery disease: Status post stent placement in right femoral artery. Continue aspirin and Plavix. 5. Essential hypertension: Continue lisinopril with hydrochlorothiazide as well as metoprolol and diltiazem 6. Hypercholesterolemia: Continue rest or 7. BPH: Continue Flomax 8. DVT prophylaxis: None as the patient has an active GI bleed 9. GI prophylaxis: As above The patient is a full code. Time spent on admission orders and patient care approximately 45 minutes  Harrie Foreman, MD 10/19/2015, 7:10 AM

## 2015-10-19 NOTE — Progress Notes (Signed)
POST DIALYSIS ASSESSMENT 

## 2015-10-19 NOTE — ED Notes (Signed)
Patient transported to CT 

## 2015-10-19 NOTE — Progress Notes (Signed)
Lab called and reported critical potassium of 6.8.  Notified Dr. Abigail Butts. No new orders recevied.

## 2015-10-19 NOTE — Consult Note (Signed)
Reason for Consult:Acute renal failure Referring Physician: Dr. Marva Panda is an 55 y.o. male.  HPI:  Patient with history of hepatitis, PVD presented with acute renal failure- Cr 18. The patient was found to have a K of 6.8 and required emergent HD and placement of a temporary hemodialysis catheter.  Past Medical History  Diagnosis Date  . Hypertriglyceridemia   . Chest pain     Unspecified  . Palpitations   . Hypertension   . Peripheral vascular disease ([place])   . Chronic kidney disease   . Kidney stone on left side     Past Surgical History  Procedure Laterality Date  . Lower extremity interventions x2 right leg Right   . Peripheral vascular catheterization Right 08/20/2014    Procedure: Lower Extremity Angiography;  Surgeon: Algernon Huxley, MD;  Location: [place] [facility];  Service: Cardiovascular;  Laterality: Right;  . Peripheral vascular catheterization Right 08/20/2014    Procedure: Lower Extremity Intervention;  Surgeon: Algernon Huxley, MD;  Location: [place] [facility];  Service: Cardiovascular;  Laterality: Right;  . Extracorporeal shock wave lithotripsy    . Extracorporeal shock wave lithotripsy Left 09/13/2014    Procedure: EXTRACORPOREAL SHOCK WAVE LITHOTRIPSY (ESWL);  Surgeon: Hollice Espy, MD;  Location: [facility];  Service: Urology;  Laterality: Left;    Family History  Problem Relation Age of Onset  . Heart failure      Social History:  reports that he quit smoking about 2 years ago. He does not have any smokeless tobacco history on file. He reports that he drinks about 8.4 oz of alcohol per week. He reports that he does not use illicit drugs.  Allergies:  Allergies  Allergen Reactions  . Fenofibrate Other (See Comments)    Medications: I have reviewed the patient's current medications.  Results for orders placed or performed during the hospital encounter of 10/19/15 (from the past 48 hour(s))  CBC with Differential     Status: Abnormal   Collection Time: 10/19/15  4:28 AM  Result Value Ref Range   WBC 6.6 3.8 - 10.6 K/uL   RBC 2.21 (L) 4.40 - 5.90 MIL/uL   Hemoglobin 7.5 (L) 13.0 - 18.0 g/dL   HCT 22.3 (L) 40.0 - 52.0 %   MCV 101.2 (H) 80.0 - 100.0 fL   MCH 34.1 (H) 26.0 - 34.0 pg   MCHC 33.7 32.0 - 36.0 g/dL   RDW 14.6 (H) 11.5 - 14.5 %   Platelets 181 150 - 440 K/uL   Neutrophils Relative % 70 %   Neutro Abs 4.6 1.4 - 6.5 K/uL   Lymphocytes Relative 15 %   Lymphs Abs 1.0 1.0 - 3.6 K/uL   Monocytes Relative 12 %   Monocytes Absolute 0.8 0.2 - 1.0 K/uL   Eosinophils Relative 3 %   Eosinophils Absolute 0.2 0 - 0.7 K/uL   Basophils Relative 0 %   Basophils Absolute 0.0 0 - 0.1 K/uL  Comprehensive metabolic panel     Status: Abnormal   Collection Time: 10/19/15  4:28 AM  Result Value Ref Range   Sodium 137 135 - 145 mmol/L   Potassium 5.9 (H) 3.5 - 5.1 mmol/L   Chloride 105 101 - 111 mmol/L   CO2 12 (L) 22 - 32 mmol/L   Glucose, Bld 96 65 - 99 mg/dL   BUN 135 (H) 6 - 20 mg/dL    Comment: RESULT CONFIRMED BY MANUAL DILUTION   Creatinine, Ser 18.38 (H)  0.61 - 1.24 mg/dL   Calcium 7.5 (L) 8.9 - 10.3 mg/dL   Total Protein 7.0 6.5 - 8.1 g/dL   Albumin 3.5 3.5 - 5.0 g/dL   AST 13 (L) 15 - 41 U/L   ALT 51 17 - 63 U/L   Alkaline Phosphatase 102 38 - 126 U/L   Total Bilirubin 0.7 0.3 - 1.2 mg/dL   GFR calc non Af Amer 2 (L) >60 mL/min   GFR calc Af Amer 3 (L) >60 mL/min    Comment: (NOTE) The eGFR has been calculated using the CKD EPI equation. This calculation has not been validated in all clinical situations. eGFR's persistently <60 mL/min signify possible Chronic Kidney Disease.    Anion gap 20 (H) 5 - 15  Troponin I     Status: None   Collection Time: 10/19/15  4:28 AM  Result Value Ref Range   Troponin I <0.03 <0.03 ng/mL  Lipase, blood     Status: Abnormal   Collection Time: 10/19/15  4:28 AM  Result Value Ref Range   Lipase 395 (H) 11 - 51 U/L    Comment: RESULT CONFIRMED BY MANUAL DILUTION  CK      Status: Abnormal   Collection Time: 10/19/15  4:28 AM  Result Value Ref Range   Total CK 728 (H) 49 - 397 U/L  Protime-INR     Status: None   Collection Time: 10/19/15  4:28 AM  Result Value Ref Range   Prothrombin Time 14.8 11.4 - 15.0 seconds   INR 1.14   TSH     Status: None   Collection Time: 10/19/15  4:28 AM  Result Value Ref Range   TSH 1.510 0.350 - 4.500 uIU/mL  Hemoglobin A1c     Status: None   Collection Time: 10/19/15  4:28 AM  Result Value Ref Range   Hgb A1c MFr Bld 5.8 4.0 - 6.0 %  Type and screen [facility]     Status: None (Preliminary result)   Collection Time: 10/19/15  5:03 AM  Result Value Ref Range   ABO/RH(D) O POS    Antibody Screen NEG    Sample Expiration 10/22/2015    Unit Number [id number]    Blood Component Type RBC LR PHER1    Unit division 00    Status of Unit ISSUED    Transfusion Status OK TO TRANSFUSE    Crossmatch Result Compatible    Unit Number [id number]    Blood Component Type RBC LR PHER1    Unit division 00    Status of Unit ISSUED    Transfusion Status OK TO TRANSFUSE    Crossmatch Result Compatible   Prepare RBC     Status: None   Collection Time: 10/19/15  5:03 AM  Result Value Ref Range   Order Confirmation ORDER PROCESSED BY BLOOD BANK   ABO/Rh     Status: None   Collection Time: 10/19/15  5:40 AM  Result Value Ref Range   ABO/RH(D) O POS   Urinalysis complete, with microscopic ([facility] only)     Status: Abnormal   Collection Time: 10/19/15  7:35 AM  Result Value Ref Range   Color, Urine STRAW (A) YELLOW   APPearance CLEAR (A) CLEAR   Glucose, UA 50 (A) NEGATIVE mg/dL   Bilirubin Urine NEGATIVE NEGATIVE   Ketones, ur NEGATIVE NEGATIVE mg/dL   Specific Gravity, Urine 1.008 1.005 - 1.030   Hgb urine dipstick 1+ (A) NEGATIVE   pH 5.0 5.0 -  8.0   Protein, ur 30 (A) NEGATIVE mg/dL   Nitrite NEGATIVE NEGATIVE   Leukocytes, UA NEGATIVE NEGATIVE   RBC / HPF 0-5 0 - 5 RBC/hpf   WBC, UA 0-5 0 - 5  WBC/hpf   Bacteria, UA RARE (A) NONE SEEN   Squamous Epithelial / LPF 0-5 (A) NONE SEEN   Amorphous Crystal PRESENT   Basic metabolic panel     Status: Abnormal   Collection Time: 10/19/15  3:34 PM  Result Value Ref Range   Sodium 138 135 - 145 mmol/L   Potassium 6.8 (HH) 3.5 - 5.1 mmol/L    Comment: CRITICAL RESULT CALLED TO, READ BACK BY AND VERIFIED WITH VERNE BOOKER ON 10/19/15 AT 1605 BY [facility]    Chloride 107 101 - 111 mmol/L   CO2 12 (L) 22 - 32 mmol/L   Glucose, Bld 88 65 - 99 mg/dL   BUN 132 (H) 6 - 20 mg/dL    Comment: RESULT CONFIRMED BY MANUAL DILUTION   Creatinine, Ser 18.79 (H) 0.61 - 1.24 mg/dL   Calcium 7.3 (L) 8.9 - 10.3 mg/dL   GFR calc non Af Amer 2 (L) >60 mL/min   GFR calc Af Amer 3 (L) >60 mL/min    Comment: (NOTE) The eGFR has been calculated using the CKD EPI equation. This calculation has not been validated in all clinical situations. eGFR's persistently <60 mL/min signify possible Chronic Kidney Disease.    Anion gap 19 (H) 5 - 15    Ct Renal Stone Study  10/19/2015  CLINICAL DATA:  Elevated creatinine and potassium. Decreased urine output last Friday. History of kidney stones. Bilateral flank pain. EXAM: CT ABDOMEN AND PELVIS WITHOUT CONTRAST TECHNIQUE: Multidetector CT imaging of the abdomen and pelvis was performed following the standard protocol without IV contrast. COMPARISON:  05/30/2014 FINDINGS: Lung bases are clear. Multiple stones demonstrated throughout the left kidney. Mild dilatation of left renal calices without significant renal pelvic dilatation. Stranding around the left kidney and renal sinus. No hydroureter or ureteral stone. Changes may represent sequela of recently passed stone or inflammatory process such as pyelonephritis. Mild stranding of the right kidney may also be inflammatory. No hydronephrosis or stone on the right. Exophytic lesions off of the right kidney probably represent cysts. Unenhanced appearance of the liver, spleen,  gallbladder, pancreas, adrenal glands, inferior vena cava, and retroperitoneal lymph nodes is unremarkable. Stomach, small bowel, and colon are not abnormally distended. No free air or free fluid in the abdomen. Pelvis: Mild bladder wall thickening may indicate cystitis. Prostate gland is enlarged. No free or loculated pelvic fluid collections. No pelvic mass or lymphadenopathy. Stranding along the left pericolic gutter extending down from the kidney. Diverticulosis of sigmoid colon without evidence of diverticulitis. Appendix is normal. Degenerative changes in the spine and hips. No destructive bone lesions. IMPRESSION: Multiple stones in the left kidney with mild Kayla seal dilatation. No ureteral stone and no hydroureter. Stranding around the kidneys and extending along the pericolic gutters bilaterally but greater on the left. This may indicate pyelonephritis or sequela of recently passed stones. Mild bladder wall thickening suggesting cystitis. Prostate gland is enlarged. Electronically Signed   By: Lucienne Capers M.D.   On: 10/19/2015 04:51    Review of Systems  Constitutional: Positive for malaise/fatigue. Negative for fever, chills and diaphoresis.  HENT: Negative.   Eyes: Negative.   Respiratory: Negative.   Cardiovascular: Negative for chest pain and orthopnea.  Gastrointestinal: Negative.   Genitourinary:  Decreased urine output  Musculoskeletal: Negative.   Skin: Negative.   Neurological: Positive for weakness.  Endo/Heme/Allergies: Negative.   Psychiatric/Behavioral: Negative.    Blood pressure 121/69, pulse 71, temperature 97.8 F (36.6 C), temperature source Oral, resp. rate 20, height 6' (1.829 m), weight 85.503 kg (188 lb 8 oz), SpO2 100 %. Physical Exam  Constitutional: He is oriented to person, place, and time. He appears well-developed and well-nourished.  HENT:  Head: Normocephalic.  Eyes: Pupils are equal, round, and reactive to light.  Neck: Normal range of  motion. Neck supple.  Cardiovascular: Normal rate and regular rhythm.   Respiratory: Effort normal.  GI: Soft. He exhibits no distension. There is no tenderness. There is no guarding.  Musculoskeletal: Normal range of motion.  Neurological: He is alert and oriented to person, place, and time.  Skin: Skin is warm.    Assessment/Plan:  Acute Renal failure  Will place a femoral temporary hemodialysis catheter.  Risk/benefits explained. Consent obtained.  Esco, Miechia A 10/19/2015, 8:04 PM      [place]

## 2015-10-19 NOTE — ED Provider Notes (Signed)
[facility] Emergency Department Provider Note   ____________________________________________  Time seen: Approximately 4:11 AM  I have reviewed the triage vital signs and the nursing notes.   HISTORY  Chief Complaint Abnormal Lab    HPI [name] [name] is a 55 y.o. male who presents to the ED from home with a chief complaint of abnormal labs. 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 Thinks he had an episode of pancreatitis approximately 2 weeks ago surrounding the July 4 holiday. States he had a several day history of epigastric discomfort associated with nausea/vomiting. Those symptoms have resolved but he was having generalized weakness and malaise and therefore pursued lab work. He was called approximately 30 minutes prior to arrival by his PCP who informed him that his creatinine is 17 and potassium 6.9 on his outpatient labs. Patient does note he had decreased urine output last Friday which has since improved. Also notes "muscle pain" to bilateral flanks which is different from kidney stone pain. Denies associated fever, chills, chest pain, shortness of breath, abdominal pain, diarrhea.Denies recent travel or trauma. Nothing makes his symptoms better or worse.   Past Medical History  Diagnosis Date  . Hypertriglyceridemia   . Chest pain     Unspecified  . Palpitations   . Hypertension   . Peripheral vascular disease ([place])   . Chronic kidney disease   . Kidney stone on left side     Patient Active Problem List   Diagnosis Date Noted  . Hepatitis 08/29/2014  . Elevated PSA 08/29/2014  . GERD (gastroesophageal reflux disease) 08/29/2014  . Gross hematuria 08/29/2014  . History of tobacco use 01/13/2014  . Benign essential HTN 09/03/2013  . Peripheral blood vessel disorder ([place]) 09/03/2013  . Palpitations 07/14/2011  . HYPERTRIGLYCERIDEMIA 09/13/2008  . CHEST PAIN-UNSPECIFIED 09/13/2008    Past  Surgical History  Procedure Laterality Date  . Lower extremity interventions x2 right leg Right   . Peripheral vascular catheterization Right 08/20/2014    Procedure: Lower Extremity Angiography;  Surgeon: Algernon Huxley, MD;  Location: [facility];  Service: Cardiovascular;  Laterality: Right;  . Peripheral vascular catheterization Right 08/20/2014    Procedure: Lower Extremity Intervention;  Surgeon: Algernon Huxley, MD;  Location: [facility];  Service: Cardiovascular;  Laterality: Right;  . Extracorporeal shock wave lithotripsy    . Extracorporeal shock wave lithotripsy Left 09/13/2014    Procedure: EXTRACORPOREAL SHOCK WAVE LITHOTRIPSY (ESWL);  Surgeon: Hollice Espy, MD;  Location: [facility];  Service: Urology;  Laterality: Left;    Current Outpatient Rx  Name  Route  Sig  Dispense  Refill  . aspirin EC 81 MG tablet   Oral   Take 81 mg by mouth daily.         . clopidogrel (PLAVIX) 75 MG tablet   Oral   Take 75 mg by mouth daily.         Marland Kitchen diltiazem (CARDIZEM CD) 120 MG 24 hr capsule   Oral   Take 120 mg by mouth daily.          Marland Kitchen lisinopril-hydrochlorothiazide (PRINZIDE,ZESTORETIC) 20-12.5 MG per tablet   Oral   Take 1 tablet by mouth daily.         . metoprolol (TOPROL-XL) 200 MG 24 hr tablet   Oral   Take 200 mg by mouth daily.          Marland Kitchen omega-3 acid ethyl esters (  LOVAZA) 1 G capsule   Oral   Take 4 g by mouth daily.          . pantoprazole (PROTONIX) 40 MG tablet   Oral   Take 40 mg by mouth daily.         . rosuvastatin (CRESTOR) 40 MG tablet   Oral   Take 40 mg by mouth every evening.         . vitamin B-12 (CYANOCOBALAMIN) 1000 MCG tablet   Oral   Take 2,000 mcg by mouth daily.         Marland Kitchen HYDROcodone-acetaminophen (NORCO/VICODIN) 5-325 MG per tablet   Oral   Take 1-2 tablets by mouth every 6 (six) hours as needed for moderate pain. Patient not taking: Reported on 10/12/2014   10 tablet   0   . tamsulosin (FLOMAX) 0.4 MG CAPS  capsule   Oral   Take 1 capsule (0.4 mg total) by mouth daily. Patient not taking: Reported on 10/19/2015   30 capsule   0     Allergies Fenofibrate  Family History  Problem Relation Age of Onset  . Heart failure      Social History Social History  Substance Use Topics  . Smoking status: Former Smoker -- 1.00 packs/day for 37 years    Quit date: 04/21/2013  . Smokeless tobacco: None  . Alcohol Use: 8.4 oz/week    7 Glasses of wine, 7 Cans of beer per week    Review of Systems  Constitutional: Positive for generalized malaise. No fever/chills. Eyes: No visual changes. ENT: No sore throat. Cardiovascular: Denies chest pain. Respiratory: Denies shortness of breath. Gastrointestinal: No abdominal pain.  No nausea, no vomiting.  No diarrhea.  No constipation. Genitourinary: Positive for decreased urine output last week. Negative for dysuria. Musculoskeletal: Negative for back pain. Skin: Negative for rash. Neurological: Negative for headaches, focal weakness or numbness.  10-point ROS otherwise negative.  ____________________________________________   PHYSICAL EXAM:  VITAL SIGNS: ED Triage Vitals  Enc Vitals Group     BP 10/19/15 0346 143/70 mmHg     Pulse Rate 10/19/15 0346 95     Resp 10/19/15 0346 18     Temp 10/19/15 0346 98.2 F (36.8 C)     Temp Source 10/19/15 0346 Oral     SpO2 10/19/15 0346 99 %     Weight 10/19/15 0346 190 lb (86.183 kg)     Height 10/19/15 0346 6' (1.829 m)     Head Cir --      Peak Flow --      Pain Score 10/19/15 0347 1     Pain Loc --      Pain Edu? --      Excl. in [place]? --     Constitutional: Alert and oriented. Well appearing and in no acute distress. Eyes: Conjunctivae are normal. PERRL. EOMI. Head: Atraumatic. Nose: No congestion/rhinnorhea. Mouth/Throat: Mucous membranes are moist.  Oropharynx non-erythematous. Neck: No stridor.   Cardiovascular: Normal rate, regular rhythm. Grossly normal heart sounds.  Good  peripheral circulation. Respiratory: Normal respiratory effort.  No retractions. Lungs CTAB. Gastrointestinal: Soft and nontender. No distention. No abdominal bruits. No CVA tenderness. Musculoskeletal: No lower extremity tenderness nor edema.  No joint effusions. Neurologic:  Normal speech and language. No gross focal neurologic deficits are appreciated. No gait instability. Skin:  Skin is warm, dry and intact. No rash noted. No vesicles noted to trunk. Psychiatric: Mood and affect are normal. Speech and behavior are normal.  ____________________________________________   LABS (all labs ordered are listed, but only abnormal results are displayed)  Labs Reviewed  CBC WITH DIFFERENTIAL/PLATELET - Abnormal; Notable for the following:    RBC 2.21 (*)    Hemoglobin 7.5 (*)    HCT 22.3 (*)    MCV 101.2 (*)    MCH 34.1 (*)    RDW 14.6 (*)    All other components within normal limits  COMPREHENSIVE METABOLIC PANEL - Abnormal; Notable for the following:    Potassium 5.9 (*)    CO2 12 (*)    BUN 135 (*)    Creatinine, Ser 18.38 (*)    Calcium 7.5 (*)    AST 13 (*)    GFR calc non Af Amer 2 (*)    GFR calc Af Amer 3 (*)    Anion gap 20 (*)    All other components within normal limits  CK - Abnormal; Notable for the following:    Total CK 728 (*)    All other components within normal limits  TROPONIN I  URINALYSIS COMPLETEWITH MICROSCOPIC ([facility] ONLY)  LIPASE, BLOOD  PROTIME-INR  TYPE AND SCREEN  PREPARE RBC (CROSSMATCH)   ____________________________________________  EKG  ED ECG REPORT I, SUNG,JADE J, the attending physician, personally viewed and interpreted this ECG.   Date: 10/19/2015  EKG Time: 0427  Rate: 88  Rhythm: normal EKG, normal sinus rhythm  Axis: Normal  Intervals:none  ST&T Change: Nonspecific  ____________________________________________  RADIOLOGY  CT renal colic study interpreted per Dr. Gerilyn Nestle: Multiple stones in the left kidney with mild Kayla  seal dilatation. No ureteral stone and no hydroureter. Stranding around the kidneys and extending along the pericolic gutters bilaterally but greater on the left. This may indicate pyelonephritis or sequela of recently passed stones. Mild bladder wall thickening suggesting cystitis. Prostate gland is enlarged. ____________________________________________   PROCEDURES  Procedure(s) performed:   Rectal exam: 2 small external nonthrombosed hemorrhoids. DRE reveals tan stool which is slowly heme positive.  Procedures  Critical Care performed:   CRITICAL CARE Performed by: Paulette Blanch   Total critical care time: 30 minutes  Critical care time was exclusive of separately billable procedures and treating other patients.  Critical care was necessary to treat or prevent imminent or life-threatening deterioration.  Critical care was time spent personally by me on the following activities: development of treatment plan with patient and/or surrogate as well as nursing, discussions with consultants, evaluation of patient's response to treatment, examination of patient, obtaining history from patient or surrogate, ordering and performing treatments and interventions, ordering and review of laboratory studies, ordering and review of radiographic studies, pulse oximetry and re-evaluation of patient's condition. ____________________________________________   INITIAL IMPRESSION / ASSESSMENT AND PLAN / ED COURSE  Pertinent labs & imaging results that were available during my care of the patient were reviewed by me and considered in my medical decision making (see chart for details).  55 year old pleasant male with hypertriglyceridemia and hypertension who was sent to the emergency department by his PCP for abnormal lab work. Will obtain screening lab work including lipase and CK; initiate IV fluid resuscitation. Although it is unlikely for bilateral obstructing stones to cause acute renal failure,  will obtain CT renal colic study to evaluate.  ----------------------------------------- 5:41 AM on 10/19/2015 -----------------------------------------  Laboratory results notable for low hemoglobin and hematocrit, extremely elevated BUN and creatinine as well as mildly elevated CK. Elevated anion gap likely secondary to metabolic acidosis. Will initiate hyperkalemia treatment, transfused 2 units of  PRBCs. Discussed with hospitalist evaluate patient in the emergency department for admission. ____________________________________________   FINAL CLINICAL IMPRESSION(S) / ED DIAGNOSES  Final diagnoses:  Acute renal failure, unspecified acute renal failure type (HCC)  Anemia, unspecified anemia type  Hyperkalemia  Peripheral blood vessel disorder (HCC)  HYPERTRIGLYCERIDEMIA      NEW MEDICATIONS STARTED DURING THIS VISIT:  New Prescriptions   No medications on file     Note:  This document was prepared using Dragon voice recognition software and may include unintentional dictation errors.    Paulette Blanch, MD 10/19/15 [phone]

## 2015-10-19 NOTE — Progress Notes (Signed)
TB skin test placed left forearm on 10/19/15 at [place], RN 10/18/16 at [phone]

## 2015-10-19 NOTE — Progress Notes (Signed)
[facility] [place] NAME: Imran Bralley    MR#:  [id number]  DATE OF BIRTH:  [birth year]  SUBJECTIVE:  CHIEF COMPLAINT:   Chief Complaint  Patient presents with  . Abnormal Lab  feels ok. Denies any symptoms at this time other than feeling weak.  Wife at bedside  REVIEW OF SYSTEMS:  Review of Systems  Constitutional: Positive for malaise/fatigue. Negative for fever, weight loss and diaphoresis.  HENT: Negative for ear discharge, ear pain, hearing loss, nosebleeds, sore throat and tinnitus.   Eyes: Negative for blurred vision and pain.  Respiratory: Negative for cough, hemoptysis, shortness of breath and wheezing.   Cardiovascular: Negative for chest pain, palpitations, orthopnea and leg swelling.  Gastrointestinal: Negative for heartburn, nausea, vomiting, abdominal pain, diarrhea, constipation and blood in stool.  Genitourinary: Negative for dysuria, urgency and frequency.  Musculoskeletal: Negative for myalgias and back pain.  Skin: Negative for itching and rash.  Neurological: Positive for weakness. Negative for dizziness, tingling, tremors, focal weakness, seizures and headaches.  Psychiatric/Behavioral: Negative for depression. The patient is not nervous/anxious.     DRUG ALLERGIES:   Allergies  Allergen Reactions  . Fenofibrate Other (See Comments)   VITALS:  Blood pressure 120/84, pulse 71, temperature 97.8 F (36.6 C), temperature source Oral, resp. rate 16, height 6' (1.829 m), weight 85.503 kg (188 lb 8 oz), SpO2 100 %. PHYSICAL EXAMINATION:  Physical Exam  Constitutional: He is oriented to person, place, and time and well-developed, well-nourished, and in no distress.  HENT:  Head: Normocephalic and atraumatic.  Eyes: Conjunctivae and EOM are normal. Pupils are equal, round, and reactive to light.  Neck: Normal range of motion. Neck supple. No tracheal deviation present. No thyromegaly present.  Cardiovascular:  Normal rate, regular rhythm and normal heart sounds.   Pulmonary/Chest: Effort normal and breath sounds normal. No respiratory distress. He has no wheezes. He exhibits no tenderness.  Abdominal: Soft. Bowel sounds are normal. He exhibits no distension. There is no tenderness.  Musculoskeletal: Normal range of motion.  Neurological: He is alert and oriented to person, place, and time. No cranial nerve deficit.  Skin: Skin is warm and dry. No rash noted.  Psychiatric: Mood and affect normal.   LABORATORY PANEL:   CBC  Recent Labs Lab 10/19/15 0428  WBC 6.6  HGB 7.5*  HCT 22.3*  PLT 181   ------------------------------------------------------------------------------------------------------------------ Chemistries   Recent Labs Lab 10/19/15 0428  NA 137  K 5.9*  CL 105  CO2 12*  GLUCOSE 96  BUN 135*  CREATININE 18.38*  CALCIUM 7.5*  AST 13*  ALT 51  ALKPHOS 102  BILITOT 0.7   RADIOLOGY:  Ct Renal Stone Study  10/19/2015  CLINICAL DATA:  Elevated creatinine and potassium. Decreased urine output last Friday. History of kidney stones. Bilateral flank pain. EXAM: CT ABDOMEN AND PELVIS WITHOUT CONTRAST TECHNIQUE: Multidetector CT imaging of the abdomen and pelvis was performed following the standard protocol without IV contrast. COMPARISON:  05/30/2014 FINDINGS: Lung bases are clear. Multiple stones demonstrated throughout the left kidney. Mild dilatation of left renal calices without significant renal pelvic dilatation. Stranding around the left kidney and renal sinus. No hydroureter or ureteral stone. Changes may represent sequela of recently passed stone or inflammatory process such as pyelonephritis. Mild stranding of the right kidney may also be inflammatory. No hydronephrosis or stone on the right. Exophytic lesions off of the right kidney probably represent cysts. Unenhanced appearance of the liver, spleen, gallbladder,  pancreas, adrenal glands, inferior vena cava, and  retroperitoneal lymph nodes is unremarkable. Stomach, small bowel, and colon are not abnormally distended. No free air or free fluid in the abdomen. Pelvis: Mild bladder wall thickening may indicate cystitis. Prostate gland is enlarged. No free or loculated pelvic fluid collections. No pelvic mass or lymphadenopathy. Stranding along the left pericolic gutter extending down from the kidney. Diverticulosis of sigmoid colon without evidence of diverticulitis. Appendix is normal. Degenerative changes in the spine and hips. No destructive bone lesions. IMPRESSION: Multiple stones in the left kidney with mild Kayla seal dilatation. No ureteral stone and no hydroureter. Stranding around the kidneys and extending along the pericolic gutters bilaterally but greater on the left. This may indicate pyelonephritis or sequela of recently passed stones. Mild bladder wall thickening suggesting cystitis. Prostate gland is enlarged. Electronically Signed   By: Lucienne Capers M.D.   On: 10/19/2015 04:51   ASSESSMENT AND PLAN:  This is a 55 year old male admitted for acute renal failure and pancreatitis.  1. Acute renal failure: Prerenal; secondary to anemia due to chronic GI losses and likely poor bone marrow response secondary to alcohol. The patient admits that he barely produce any urine 2 days ago but is now making small amounts of completely clear urine. Potassium is mildly elevated but the patient has no EKG changes. continue aggressive IV hydration and consult nephrology. No parenchymal kidney disease seen on CT of the abdomen. May need dialysis if no better - can't r/o vasculitis (pt worried about it) - Nephro ordered further w/up  2. Pancreatitis: Likely secondary to chronic alcohol abuse. continue pain meds and continue to hydrate with intravenous fluid. BUN is very high but calcium is low. White blood cell count normal. The patient is afebrile. Will start CLD and monitor  3. GI bleed: Chronic; Hemoccult weakly  positive. Gastroenterology c/s for possible upper endoscopy. The patient reports normal colonoscopies in past. continue IV pantoprazole.   4. Anemia: possibility of slow GI bleed, hold Asa, Plavix. 2 PRBC ordered. GI c/s  5. Essential hypertension: Stop lisinopril - hydrochlorothiazide. Continue metoprolol and diltiazem  6. Hypercholesterolemia: Continue crestor 7. BPH: Continue Flomax 8. DVT prophylaxis: None as the patient has an active GI bleed 9. Hyperkalemia: recheck, should resolve with IVF GI prophylaxis: As above     All the records are reviewed and case discussed with Care Management/Social Worker. Management plans discussed with the patient, family and they are in agreement.  CODE STATUS: FULL CODE  TOTAL TIME TAKING CARE OF THIS PATIENT: 35 minutes.   More than 50% of the time was spent in counseling/coordination of care: YES  POSSIBLE D/C IN 4-5 DAYS, DEPENDING ON CLINICAL CONDITION. AND KIDNEY WORK [name], Burrell Hodapp M.D on 10/19/2015 at 12:10 PM  Between 7am to 6pm - Pager - [phone]  After 6pm go to [email or web address] - Proofreader  [facility]  Office  [phone]  CC: Primary care physician; Kirk Ruths., MD  Note: This dictation was prepared with Dragon dictation along with smaller phrase technology. Any transcriptional errors that result from this process are unintentional.

## 2015-10-19 NOTE — ED Notes (Signed)
Pt placed on O2 2L/min/[place]

## 2015-10-19 NOTE — ED Notes (Signed)
Patient states that his dr called him about 30 minutes ago and told him to come to the emergency room that his creatine is 17 and potassium 6.9. Patient states that he has decreased urine output last Friday.

## 2015-10-19 NOTE — Progress Notes (Signed)
Patient's potassium returned at 6.8. He is getting his second transfusion of PRBC. He is not producing much urine (~200 since beginning of the day).  Will consult vascular surgery for dialysis catheter. Patient is willing to have emergent hemodialysis. Orders prepared. Risks and benefits discussed.   Juleen China, Demani Mcbrien 5:45 PM 10/19/2015

## 2015-10-19 NOTE — Progress Notes (Signed)
HD COMPLETED  

## 2015-10-19 NOTE — Progress Notes (Signed)
HD STARTED  

## 2015-10-20 LAB — HEPATITIS C ANTIBODY

## 2015-10-20 LAB — C4 COMPLEMENT: Complement C4, Body Fluid: 38 mg/dL (ref 14–44)

## 2015-10-20 LAB — MPO/PR-3 (ANCA) ANTIBODIES: ANCA Proteinase 3: <3.5 U/mL (ref 0.0–3.5)

## 2015-10-20 LAB — TYPE AND SCREEN
ABO/RH(D): O POS
ANTIBODY SCREEN: NEGATIVE
UNIT DIVISION: 0
UNIT DIVISION: 0

## 2015-10-20 LAB — PROTIME-INR
INR: 1.21
PROTHROMBIN TIME: 15.5 s — AB (ref 11.4–15.0)

## 2015-10-20 LAB — CBC
HEMATOCRIT: 24 % — AB (ref 40.0–52.0)
Hemoglobin: 8.3 g/dL — ABNORMAL LOW (ref 13.0–18.0)
MCH: 33 pg (ref 26.0–34.0)
MCHC: 34.6 g/dL (ref 32.0–36.0)
MCV: 95.1 fL (ref 80.0–100.0)
Platelets: 151 10*3/uL (ref 150–440)
RBC: 2.52 MIL/uL — ABNORMAL LOW (ref 4.40–5.90)
RDW: 16.4 % — AB (ref 11.5–14.5)
WBC: 5 10*3/uL (ref 3.8–10.6)

## 2015-10-20 LAB — HEPATITIS B SURFACE ANTIBODY,QUALITATIVE: HEP B S AB: REACTIVE

## 2015-10-20 LAB — RENAL FUNCTION PANEL
ALBUMIN: 2.8 g/dL — AB (ref 3.5–5.0)
ANION GAP: 15 (ref 5–15)
BUN: 89 mg/dL — ABNORMAL HIGH (ref 6–20)
CALCIUM: 7.1 mg/dL — AB (ref 8.9–10.3)
CO2: 20 mmol/L — ABNORMAL LOW (ref 22–32)
Chloride: 105 mmol/L (ref 101–111)
Creatinine, Ser: 13.1 mg/dL — ABNORMAL HIGH (ref 0.61–1.24)
GFR, EST AFRICAN AMERICAN: 4 mL/min — AB (ref >60)
GFR, EST NON AFRICAN AMERICAN: 4 mL/min — AB (ref >60)
Glucose, Bld: 88 mg/dL (ref 65–99)
PHOSPHORUS: 8.9 mg/dL — AB (ref 2.5–4.6)
POTASSIUM: 4.2 mmol/L (ref 3.5–5.1)
Sodium: 140 mmol/L (ref 135–145)

## 2015-10-20 LAB — APTT: aPTT: 33 seconds (ref 24–36)

## 2015-10-20 LAB — HEPATITIS B SURFACE ANTIGEN: Hepatitis B Surface Ag: NEGATIVE

## 2015-10-20 LAB — HEPATITIS B CORE ANTIBODY, IGM: Hep B C IgM: NEGATIVE

## 2015-10-20 LAB — AMYLASE: Amylase: 208 U/L — ABNORMAL HIGH (ref 28–100)

## 2015-10-20 LAB — LIPASE, BLOOD: LIPASE: 156 U/L — AB (ref 11–51)

## 2015-10-20 LAB — C3 COMPLEMENT: C3 Complement: 159 mg/dL (ref 82–167)

## 2015-10-20 NOTE — Progress Notes (Addendum)
[facility]  ROUNDING NOTE   Subjective:   Emergent dialysis yesterday due to hyperkalemia and acute renal failure. Potassium today at goal.  Hemodialysis treatment complicated by clotting. Heparin administered.   Objective:  Vital signs in last 24 hours:  Temp:  [97.4 F (36.3 C)-98.2 F (36.8 C)] 97.9 F (36.6 C) (07/16 0615) Pulse Rate:  [71-101] 84 (07/16 1007) Resp:  [16-20] 18 (07/16 0615) BP: (96-133)/(53-84) 129/76 mmHg (07/16 1007) SpO2:  [97 %-100 %] 100 % (07/16 1007) Weight:  [89.2 kg (196 lb 10.4 oz)-91.5 kg (201 lb 11.5 oz)] 89.2 kg (196 lb 10.4 oz) (07/16 0500)  Weight change: -0.68 kg (-1 lb 8 oz) Filed Weights   10/19/15 2015 10/19/15 2220 10/20/15 0500  Weight: 91.5 kg (201 lb 11.5 oz) 91.4 kg (201 lb 8 oz) 89.2 kg (196 lb 10.4 oz)    Intake/Output: I/O last 3 completed shifts: In: 1166.9 [I.V.:497; Blood:669.9] Out: 425 [Urine:425]   Intake/Output this shift:     Physical Exam: General: NAD, laying in bed  Head: Normocephalic, atraumatic. Moist oral mucosal membranes  Eyes: Anicteric, PERRL  Neck: Supple, trachea midline  Lungs:  Clear to auscultation  Heart: Regular rate and rhythm  Abdomen:  Soft, nontender,   Extremities: no peripheral edema.  Neurologic: Nonfocal, moving all four extremities  Skin: No lesions  Access: Left femoral temp HD catheter 7/15 Dr. Lorenso Courier    Basic Metabolic Panel:  Recent Labs Lab 10/19/15 0428 10/19/15 1534 10/20/15 0544  NA 137 138 140  K 5.9* 6.8* 4.2  CL 105 107 105  CO2 12* 12* 20*  GLUCOSE 96 88 88  BUN 135* 132* 89*  CREATININE 18.38* 18.79* 13.10*  CALCIUM 7.5* 7.3* 7.1*  PHOS  --   --  8.9*    Liver Function Tests:  Recent Labs Lab 10/19/15 0428 10/20/15 0544  AST 13*  --   ALT 51  --   ALKPHOS 102  --   BILITOT 0.7  --   PROT 7.0  --   ALBUMIN 3.5 2.8*    Recent Labs Lab 10/19/15 0428  LIPASE 395*   No results for input(s): AMMONIA in the last 168  hours.  CBC:  Recent Labs Lab 10/19/15 0428 10/20/15 0544  WBC 6.6 5.0  NEUTROABS 4.6  --   HGB 7.5* 8.3*  HCT 22.3* 24.0*  MCV 101.2* 95.1  PLT 181 151    Cardiac Enzymes:  Recent Labs Lab 10/19/15 0428  CKTOTAL 728*  TROPONINI <0.03    BNP: Invalid input(s): POCBNP  CBG: No results for input(s): GLUCAP in the last 168 hours.  Microbiology: Results for orders placed or performed in visit on 04/23/14  Urine culture     Status: None   Collection Time: 04/23/14  9:35 AM  Result Value Ref Range Status   Micro Text Report   Final       SOURCE: CLEAN CATCH    COMMENT                   NO GROWTH IN 18-24 HOURS   ANTIBIOTIC                                                        Coagulation Studies:  Recent Labs  10/19/15 0428  LABPROT 14.8  INR 1.14  Urinalysis:  Recent Labs  10/19/15 0735  COLORURINE STRAW*  LABSPEC 1.008  PHURINE 5.0  GLUCOSEU 50*  HGBUR 1+*  BILIRUBINUR NEGATIVE  KETONESUR NEGATIVE  PROTEINUR 30*  NITRITE NEGATIVE  LEUKOCYTESUR NEGATIVE      Imaging: Ct Renal Stone Study  10/19/2015  CLINICAL DATA:  Elevated creatinine and potassium. Decreased urine output last Friday. History of kidney stones. Bilateral flank pain. EXAM: CT ABDOMEN AND PELVIS WITHOUT CONTRAST TECHNIQUE: Multidetector CT imaging of the abdomen and pelvis was performed following the standard protocol without IV contrast. COMPARISON:  05/30/2014 FINDINGS: Lung bases are clear. Multiple stones demonstrated throughout the left kidney. Mild dilatation of left renal calices without significant renal pelvic dilatation. Stranding around the left kidney and renal sinus. No hydroureter or ureteral stone. Changes may represent sequela of recently passed stone or inflammatory process such as pyelonephritis. Mild stranding of the right kidney may also be inflammatory. No hydronephrosis or stone on the right. Exophytic lesions off of the right kidney probably represent  cysts. Unenhanced appearance of the liver, spleen, gallbladder, pancreas, adrenal glands, inferior vena cava, and retroperitoneal lymph nodes is unremarkable. Stomach, small bowel, and colon are not abnormally distended. No free air or free fluid in the abdomen. Pelvis: Mild bladder wall thickening may indicate cystitis. Prostate gland is enlarged. No free or loculated pelvic fluid collections. No pelvic mass or lymphadenopathy. Stranding along the left pericolic gutter extending down from the kidney. Diverticulosis of sigmoid colon without evidence of diverticulitis. Appendix is normal. Degenerative changes in the spine and hips. No destructive bone lesions. IMPRESSION: Multiple stones in the left kidney with mild [name] [name] dilatation. No ureteral stone and no hydroureter. Stranding around the kidneys and extending along the pericolic gutters bilaterally but greater on the left. This may indicate pyelonephritis or sequela of recently passed stones. Mild bladder wall thickening suggesting cystitis. Prostate gland is enlarged. Electronically Signed   By: Lucienne Capers M.D.   On: 10/19/2015 04:51     Medications:   . sodium chloride 125 mL/hr at 10/20/15 0525   . sodium chloride  10 mL/hr Intravenous Once  . diltiazem  120 mg Oral Daily  . docusate sodium  100 mg Oral BID  . folic acid  1 mg Oral Daily  . heparin  5,000 Units Intravenous Once  . metoprolol  200 mg Oral Daily  . multivitamin with minerals  1 tablet Oral Daily  . omega-3 acid ethyl esters  4 g Oral Daily  . pantoprazole (PROTONIX) IV  40 mg Intravenous Q12H  . rosuvastatin  40 mg Oral QPM  . sodium bicarbonate  1,300 mg Oral BID  . sodium chloride flush  3 mL Intravenous Q12H  . tamsulosin  0.4 mg Oral Daily  . thiamine  100 mg Oral Daily   Or  . thiamine  100 mg Intravenous Daily  . tuberculin  5 Units Intradermal Once  . vitamin B-12  2,000 mcg Oral Daily   acetaminophen **OR** acetaminophen, LORazepam **OR** LORazepam,  morphine injection, ondansetron **OR** ondansetron (ZOFRAN) IV, oxyCODONE  Assessment/ Plan:  [name] [name] is a 55 y.o. European-American ([place]) male with EtOH abuse, pancreatitis, peripheral vascular disease, hypertension, hyperlipidemia, BPH, who was admitted to [facility] on 10/19/2015 for acute renal failure  1. Acute Renal Failure: baseline creatinine of 1 on 06/07/15 2. Hyperkalemia 3. Metabolic acidosis 4. Anemia with renal failure: 2 units PRBC on 7/15  5. Hypocalcemia: corrected calcium of 8.1  Plan Acute renal failure with no apparent  obstructive uropathy.  Urine microscopy performed today showing many muddy brown casts.  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 Urine studies do show hematuria in 04/2014 however history of renal stones.  Emergent hemodialysis on 7/15. Continue to monitor daily for dialysis need.  PPD placed 7/15 - Check PT/INR and aPTT due to increased clotting.  - Will do a urinalysis and urine microscopy today looking for red cells/casts. - Sodium bicarbonate for metabolic acidosis - Continue IV fluids: NS - Check serologic work up: ANA, ANCA, anti-GBM, serum complements, SPEP/UPEP. Viral Hepatis screen negative. Discussed renal biopsy with patient.   - Strict I&os.    LOS: [place], [place] 7/16/201710:23 AM

## 2015-10-20 NOTE — Progress Notes (Signed)
[place] at [place] NAME: [name] [name]    MR#:  [id number]  DATE OF BIRTH:  Mar 27, 1961  SUBJECTIVE:  CHIEF COMPLAINT:   Chief Complaint  Patient presents with  . Abnormal Lab  Require dialysis emergently last night due to persistent hyperkalemia and oliguria, post dialysis now.  Potassium normalized.  He is feeling somewhat better, asking wanting to go back to work, Also questioning Aspirin and Plavix to be resumed REVIEW OF SYSTEMS:  Review of Systems  Constitutional: Positive for malaise/fatigue. Negative for fever, weight loss and diaphoresis.  HENT: Negative for ear discharge, ear pain, hearing loss, nosebleeds, sore throat and tinnitus.   Eyes: Negative for blurred vision and pain.  Respiratory: Negative for cough, hemoptysis, shortness of breath and wheezing.   Cardiovascular: Negative for chest pain, palpitations, orthopnea and leg swelling.  Gastrointestinal: Negative for heartburn, nausea, vomiting, abdominal pain, diarrhea, constipation and blood in stool.  Genitourinary: Negative for dysuria, urgency and frequency.  Musculoskeletal: Negative for myalgias and back pain.  Skin: Negative for itching and rash.  Neurological: Positive for weakness. Negative for dizziness, tingling, tremors, focal weakness, seizures and headaches.  Psychiatric/Behavioral: Negative for depression. The patient is not nervous/anxious.    DRUG ALLERGIES:   Allergies  Allergen Reactions  . Fenofibrate Other (See Comments)   VITALS:  Blood pressure 129/76, pulse 84, temperature 97.9 F (36.6 C), temperature source Oral, resp. rate 18, height 6' (1.829 m), weight 89.2 kg (196 lb 10.4 oz), SpO2 100 %. PHYSICAL EXAMINATION:  Physical Exam  Constitutional: He is oriented to person, place, and time and well-developed, well-nourished, and in no distress.  HENT:  Head: Normocephalic and atraumatic.  Eyes: Conjunctivae and EOM are normal. Pupils are  equal, round, and reactive to light.  Neck: Normal range of motion. Neck supple. No tracheal deviation present. No thyromegaly present.  Cardiovascular: Normal rate, regular rhythm and normal heart sounds.   Pulmonary/Chest: Effort normal and breath sounds normal. No respiratory distress. He has no wheezes. He exhibits no tenderness.  Abdominal: Soft. Bowel sounds are normal. He exhibits no distension. There is no tenderness.  Musculoskeletal: Normal range of motion.  Neurological: He is alert and oriented to person, place, and time. No cranial nerve deficit.  Skin: Skin is warm and dry. No rash noted.  Psychiatric: Mood and affect normal.   LABORATORY PANEL:   CBC  Recent Labs Lab 10/20/15 0544  WBC 5.0  HGB 8.3*  HCT 24.0*  PLT 151   ------------------------------------------------------------------------------------------------------------------ Chemistries   Recent Labs Lab 10/19/15 0428  10/20/15 0544  NA 137  < > 140  K 5.9*  < > 4.2  CL 105  < > 105  CO2 12*  < > 20*  GLUCOSE 96  < > 88  BUN 135*  < > 89*  CREATININE 18.38*  < > 13.10*  CALCIUM 7.5*  < > 7.1*  AST 13*  --   --   ALT 51  --   --   ALKPHOS 102  --   --   BILITOT 0.7  --   --   < > = values in this interval not displayed. RADIOLOGY:  No results found. ASSESSMENT AND PLAN:  This is a 55 year old male admitted for acute renal failure and pancreatitis.  1. Acute renal failure: Of unknown etiology at this time - Requiring emergent hemodialysis last evening due to persistent hyperkalemia and metabolic acidosis  - Hyperkalemia, resolved.  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  If positive, may need transfer to tertiary care center for vasculitis and  need for plasmapheresis   2. Pancreatitis: Likely secondary to chronic alcohol abuse.  Slowly improving, amylase and lipase coming down,   3. GI bleed: Chronic; Hemoccult weakly positive. Gastroenterology c/s for possible upper endoscopy (no GI coverage over the weekend). The patient reports normal colonoscopies in past. continue IV pantoprazole.  4. Anemia: possibility of slow GI bleed, hold Asa, Plavix. S/p 2 PRBC transfusion - hemoglobin did not respond appropriately - up to 8.3 (7.5)  5. Essential hypertension: Stop lisinopril - hydrochlorothiazide. Continue metoprolol and diltiazem  6. Hypercholesterolemia: Continue crestor 7. BPH: Continue Flomax 8. DVT prophylaxis: None as the patient has an active GI bleed 9. Hyperkalemia: recheck, should resolve with IVF GI prophylaxis: As above     All the records are reviewed and case discussed with Care Management/Social Worker. Management plans discussed with the patient, family (wife at bedside) and they are in agreement. Discussed with nephrology - checking his urine analysis for any RBC cast.  If positive, may need transfer to tertiary care center for vasculitis and need for plasmapheresis   CODE STATUS: FULL CODE  TOTAL TIME TAKING CARE OF THIS PATIENT: 35 minutes.   More than 50% of the time was spent in counseling/coordination of care: YES  POSSIBLE D/C IN 4-5 DAYS, DEPENDING ON CLINICAL CONDITION. AND KIDNEY WORK [name], Evona Westra M.D on 10/20/2015 at 12:58 PM  Between 7am to 6pm - Pager - [phone]  After 6pm go to [email or web address] - Proofreader  [facility]  Office  [phone]  CC: Primary care physician; Kirk Ruths., MD  Note: This dictation was prepared with Dragon dictation along with smaller phrase technology. Any transcriptional errors that result from this process are unintentional.

## 2015-10-21 DIAGNOSIS — D62 Acute posthemorrhagic anemia: Secondary | ICD-10-CM | POA: Insufficient documentation

## 2015-10-21 DIAGNOSIS — K858 Other acute pancreatitis without necrosis or infection: Secondary | ICD-10-CM

## 2015-10-21 DIAGNOSIS — D649 Anemia, unspecified: Secondary | ICD-10-CM

## 2015-10-21 LAB — KAPPA/LAMBDA LIGHT CHAINS
KAPPA, LAMDA LIGHT CHAIN RATIO: 1.51 (ref 0.26–1.65)
Kappa free light chain: 135.2 mg/L — ABNORMAL HIGH (ref 3.3–19.4)
LAMDA FREE LIGHT CHAINS: 89.8 mg/L — AB (ref 5.7–26.3)

## 2015-10-21 LAB — RENAL FUNCTION PANEL
Albumin: 2.9 g/dL — ABNORMAL LOW (ref 3.5–5.0)
Anion gap: 15 (ref 5–15)
BUN: 88 mg/dL — AB (ref 6–20)
CHLORIDE: 104 mmol/L (ref 101–111)
CO2: 19 mmol/L — ABNORMAL LOW (ref 22–32)
CREATININE: 12.82 mg/dL — AB (ref 0.61–1.24)
Calcium: 6.3 mg/dL — CL (ref 8.9–10.3)
GFR calc Af Amer: 4 mL/min — ABNORMAL LOW (ref >60)
GFR, EST NON AFRICAN AMERICAN: 4 mL/min — AB (ref >60)
Glucose, Bld: 120 mg/dL — ABNORMAL HIGH (ref 65–99)
Phosphorus: 8.4 mg/dL — ABNORMAL HIGH (ref 2.5–4.6)
Potassium: 3.8 mmol/L (ref 3.5–5.1)
Sodium: 138 mmol/L (ref 135–145)

## 2015-10-21 LAB — CBC
HEMATOCRIT: 23.9 % — AB (ref 40.0–52.0)
Hemoglobin: 8.2 g/dL — ABNORMAL LOW (ref 13.0–18.0)
MCH: 32.3 pg (ref 26.0–34.0)
MCHC: 34.4 g/dL (ref 32.0–36.0)
MCV: 93.9 fL (ref 80.0–100.0)
Platelets: 154 10*3/uL (ref 150–440)
RBC: 2.54 MIL/uL — ABNORMAL LOW (ref 4.40–5.90)
RDW: 16.2 % — ABNORMAL HIGH (ref 11.5–14.5)
WBC: 6.5 10*3/uL (ref 3.8–10.6)

## 2015-10-21 LAB — GLOMERULAR BASEMENT MEMBRANE ANTIBODIES: GBM AB: 3 U (ref 0–20)

## 2015-10-21 LAB — PARATHYROID HORMONE, INTACT (NO CA): PTH: 74 pg/mL — ABNORMAL HIGH (ref 15–65)

## 2015-10-21 LAB — ANA W/REFLEX: ANA: NEGATIVE

## 2015-10-21 NOTE — Progress Notes (Signed)
Pre Dialysis 

## 2015-10-21 NOTE — Progress Notes (Signed)
Post dialysis 

## 2015-10-21 NOTE — Care Management (Signed)
Acute HD.  Notified dialysis liaison Iran Sizer

## 2015-10-21 NOTE — Progress Notes (Signed)
Tx complete  

## 2015-10-21 NOTE — Progress Notes (Signed)
Dr. Manuella Ghazi and Dr. Candiss Norse notified via text message of a critical value for Ca+ at 6.3

## 2015-10-21 NOTE — Progress Notes (Signed)
[place] at [place] NAME: [name] [name]    MR#:  [id number]  DATE OF BIRTH:  1961/03/02  SUBJECTIVE:  CHIEF COMPLAINT:   Chief Complaint  Patient presents with  . Abnormal Lab  Feeling better, wanted to see CT scan results which were reviewed at bedside with him REVIEW OF SYSTEMS:  Review of Systems  Constitutional: Positive for malaise/fatigue. Negative for fever, weight loss and diaphoresis.  HENT: Negative for ear discharge, ear pain, hearing loss, nosebleeds, sore throat and tinnitus.   Eyes: Negative for blurred vision and pain.  Respiratory: Negative for cough, hemoptysis, shortness of breath and wheezing.   Cardiovascular: Negative for chest pain, palpitations, orthopnea and leg swelling.  Gastrointestinal: Negative for heartburn, nausea, vomiting, abdominal pain, diarrhea, constipation and blood in stool.  Genitourinary: Negative for dysuria, urgency and frequency.  Musculoskeletal: Negative for myalgias and back pain.  Skin: Negative for itching and rash.  Neurological: Positive for weakness. Negative for dizziness, tingling, tremors, focal weakness, seizures and headaches.  Psychiatric/Behavioral: Negative for depression. The patient is not nervous/anxious.    DRUG ALLERGIES:   Allergies  Allergen Reactions  . Fenofibrate Other (See Comments)   VITALS:  Blood pressure 156/71, pulse 90, temperature 98.3 F (36.8 C), temperature source Oral, resp. rate 18, height 6' (1.829 m), weight 87.6 kg (193 lb 2 oz), SpO2 97 %. PHYSICAL EXAMINATION:  Physical Exam  Constitutional: He is oriented to person, place, and time and well-developed, well-nourished, and in no distress.  HENT:  Head: Normocephalic and atraumatic.  Eyes: Conjunctivae and EOM are normal. Pupils are equal, round, and reactive to light.  Neck: Normal range of motion. Neck supple. No tracheal deviation present. No thyromegaly present.  Cardiovascular: Normal  rate, regular rhythm and normal heart sounds.   Pulmonary/Chest: Effort normal and breath sounds normal. No respiratory distress. He has no wheezes. He exhibits no tenderness.  Abdominal: Soft. Bowel sounds are normal. He exhibits no distension. There is no tenderness.  Musculoskeletal: Normal range of motion.  Neurological: He is alert and oriented to person, place, and time. No cranial nerve deficit.  Skin: Skin is warm and dry. No rash noted.  Psychiatric: Mood and affect normal.   LABORATORY PANEL:   CBC  Recent Labs Lab 10/21/15 0636  WBC 6.5  HGB 8.2*  HCT 23.9*  PLT 154   ------------------------------------------------------------------------------------------------------------------ Chemistries   Recent Labs Lab 10/19/15 0428  10/21/15 0636  NA 137  < > 138  K 5.9*  < > 3.8  CL 105  < > 104  CO2 12*  < > 19*  GLUCOSE 96  < > 120*  BUN 135*  < > 88*  CREATININE 18.38*  < > 12.82*  CALCIUM 7.5*  < > 6.3*  AST 13*  --   --   ALT 51  --   --   ALKPHOS 102  --   --   BILITOT 0.7  --   --   < > = values in this interval not displayed. RADIOLOGY:  No results found. ASSESSMENT AND PLAN:  This is a 55 year old male admitted for acute renal failure and pancreatitis.  1. Acute renal failure: Of unknown etiology at this time - Most of the lab work so far has been negative - Urine microscopic exam showed muddy brown casts suggestive of ATN - Dialysis per nephrology  2. Pancreatitis: amylase and lipase coming down, now resolved.  Could be due to hypertriglyceridemia  3.  GI bleed: Chronic; Hemoccult weakly positive. Gastroenterology c/s for possible upper endoscopy (no GI coverage over the weekend). The patient reports normal colonoscopies in past. continue IV pantoprazole.  4. Anemia: possibility of slow GI bleed, hold Asa, Plavix. S/p 2 PRBC transfusion - hemoglobin did not respond appropriately - up to 8.3 (7.5)  5. Essential hypertension: Stop lisinopril -  hydrochlorothiazide. Continue metoprolol and diltiazem  6. Hypercholesterolemia: Continue crestor 7. BPH: Continue Flomax 8.  Multiple kidney stones: Nonobstructive, can follow up with urology as an outpatient 9. Hyperkalemia: Resolved with dialysis GI prophylaxis: As above DVT prophylaxis: None as the patient has an active GI bleed     All the records are reviewed and case discussed with Care Management/Social Worker. Management plans discussed with the patient and nursing  CODE STATUS: FULL CODE  TOTAL TIME TAKING CARE OF THIS PATIENT: 35 minutes.   More than 50% of the time was spent in counseling/coordination of care: YES  POSSIBLE D/C IN 4-5 DAYS, DEPENDING ON CLINICAL CONDITION. AND KIDNEY WORK [name], [name] M.D on 10/21/2015 at 4:37 PM  Between 7am to 6pm - Pager - [phone]  After 6pm go to [email or web address] - Proofreader  [facility]  Office  [phone]  CC: Primary care physician; Kirk Ruths., MD  Note: This dictation was prepared with Dragon dictation along with smaller phrase technology. Any transcriptional errors that result from this process are unintentional.

## 2015-10-21 NOTE — Progress Notes (Signed)
Dialysis started 

## 2015-10-21 NOTE — Progress Notes (Signed)
Subjective:  Patient feels well today No acute complaints No shortness of breath or lower extremity edema Patient reports that he is voiding more frequently in larger amounts Urine output reported at 800 cc last 24 hours  Objective:  Vital signs in last 24 hours:  Temp:  [98.2 F (36.8 C)-99.2 F (37.3 C)] 99.2 F (37.3 C) (07/17 1213) Pulse Rate:  [64-109] 74 (07/17 1430) Resp:  [17-22] 20 (07/17 1430) BP: (130-163)/(64-101) 153/76 mmHg (07/17 1430) SpO2:  [91 %-99 %] 96 % (07/17 1430) Weight:  [87.6 kg (193 lb 2 oz)] 87.6 kg (193 lb 2 oz) (07/17 1213)  Weight change: 2.097 kg (4 lb 10 oz) Filed Weights   10/20/15 0500 10/21/15 0500 10/21/15 1213  Weight: 89.2 kg (196 lb 10.4 oz) 87.6 kg (193 lb 2 oz) 87.6 kg (193 lb 2 oz)    Intake/Output:    Intake/Output Summary (Last 24 hours) at 10/21/15 1442 Last data filed at 10/21/15 0900  Gross per 24 hour  Intake   1341 ml  Output   1100 ml  Net    241 ml     Physical Exam: General: No acute distress  HEENT anicteric, moist mucous membranes  Neck supple  Pulm/lungs Normal breathing effort, clear to auscultation bilaterally  CVS/Heart Regular, no rub or gallop  Abdomen:  Soft, nontender  Extremities:  no peripheral edema  Neurologic: Alert, oriented  Skin: no acute rashes  Access: leftfemoral temporary cancer       Basic Metabolic Panel:   Recent Labs Lab 10/19/15 0428 10/19/15 1534 10/20/15 0544 10/21/15 0636  NA 137 138 140 138  K 5.9* 6.8* 4.2 3.8  CL 105 107 105 104  CO2 12* 12* 20* 19*  GLUCOSE 96 88 88 120*  BUN 135* 132* 89* 88*  CREATININE 18.38* 18.79* 13.10* 12.82*  CALCIUM 7.5* 7.3* 7.1* 6.3*  PHOS  --   --  8.9* 8.4*     CBC:  Recent Labs Lab 10/19/15 0428 10/20/15 0544 10/21/15 0636  WBC 6.6 5.0 6.5  NEUTROABS 4.6  --   --   HGB 7.5* 8.3* 8.2*  HCT 22.3* 24.0* 23.9*  MCV 101.2* 95.1 93.9  PLT 181 151 154      Microbiology:  No results found for this or any previous  visit (from the past 720 hour(s)).  Coagulation Studies:  Recent Labs  10/19/15 0428 10/20/15 1204  LABPROT 14.8 15.5*  INR 1.14 1.21    Urinalysis:  Recent Labs  10/19/15 0735  COLORURINE STRAW*  LABSPEC 1.008  PHURINE 5.0  GLUCOSEU 50*  HGBUR 1+*  BILIRUBINUR NEGATIVE  KETONESUR NEGATIVE  PROTEINUR 30*  NITRITE NEGATIVE  LEUKOCYTESUR NEGATIVE      Imaging: No results found.   Medications:   . sodium chloride 75 mL/hr at 10/21/15 0242   . diltiazem  120 mg Oral Daily  . docusate sodium  100 mg Oral BID  . folic acid  1 mg Oral Daily  . heparin  5,000 Units Intravenous Once  . metoprolol  200 mg Oral Daily  . multivitamin with minerals  1 tablet Oral Daily  . omega-3 acid ethyl esters  4 g Oral Daily  . pantoprazole (PROTONIX) IV  40 mg Intravenous Q12H  . rosuvastatin  40 mg Oral QPM  . sodium bicarbonate  1,300 mg Oral BID  . sodium chloride flush  3 mL Intravenous Q12H  . tamsulosin  0.4 mg Oral Daily  . thiamine  100 mg Oral Daily  .  tuberculin  5 Units Intradermal Once  . vitamin B-12  2,000 mcg Oral Daily   acetaminophen **OR** acetaminophen, LORazepam **OR** LORazepam, morphine injection, ondansetron **OR** ondansetron (ZOFRAN) IV, oxyCODONE  Assessment/ Plan:  55 y.o. male originally from [place], history of alcohol abuse, hepatitis, peripheral vascular disease, hypertension, hyperlipidemia, BPH, admitted to [facility] on July 15 for acute renal failure   1. Acute Renal Failure: baseline creatinine of 1 on 06/07/15 2. Hyperkalemia 3. Metabolic acidosis 4. Anemia with renal failure: 2 units PRBC on 7/15  5. Hypocalcemia: corrected calcium of 8.1 6.  Acute hepatitis 7.  Hyperlipidemia, severe  Serological studies have been negative so far.  Urine microscopic exam performed by Dr. Juleen China showed muddy brown cast suggesting ATN. Urine output is improving. Will continue to monitor renal function on a daily basis.  Suggest another  dialysis treatment today to correct uremia.  Continue to monitor urine output closely. avoid oral calcium supplements due to acute pancreatitis   LOS: 2 Dessirae Scarola 7/17/20172:42 PM

## 2015-10-21 NOTE — Consult Note (Signed)
Lucilla Lame, MD [address] [place], [place] [address] Phone: [phone] Fax : [phone]  Consultation  Referring Provider:     No ref. provider found Primary Care Physician:  Kirk Ruths., MD Primary Gastroenterologist:  Dr. Allen Norris         Reason for Consultation:     Chronic GI bleed  Date of Admission:  10/19/2015 Date of Consultation:  10/21/2015         HPI:   [name] [name] is a 55 y.o. male who had a colonoscopy by me back in November 2015. The patient now comes in with weakly positive Hemoccult stools. The patient was also found to have acute renal failure requiring dialysis because of hyperkalemia. The patient was found to have a hemoglobin of 7.5 on admission with a MCV of 101. His hemoglobin in back on January 2016 was 12.7. The patient received blood transfusion of 2 units yesterday and is now up to 8.2. The patient reports that he has had diarrhea with fatty and greasy stools. There is no report of any black stools. The patient was started on IV pantoprazole. The patient's lipase on admission was 395 and it has come down to 156 as of yesterday. He reports that he suffers from hypertriglyceridemia. Patient does report drinking proximally 4 bottles of wine a week and also finishes off a case of beer per week.  Past Medical History  Diagnosis Date  . Hypertriglyceridemia   . Chest pain     Unspecified  . Palpitations   . Hypertension   . Peripheral vascular disease ([place])   . Chronic kidney disease   . Kidney stone on left side     Past Surgical History  Procedure Laterality Date  . Lower extremity interventions x2 right leg Right   . Peripheral vascular catheterization Right 08/20/2014    Procedure: Lower Extremity Angiography;  Surgeon: Algernon Huxley, MD;  Location: [facility];  Service: Cardiovascular;  Laterality: Right;  . Peripheral vascular catheterization Right 08/20/2014    Procedure: Lower Extremity Intervention;  Surgeon:  Algernon Huxley, MD;  Location: [facility];  Service: Cardiovascular;  Laterality: Right;  . Extracorporeal shock wave lithotripsy    . Extracorporeal shock wave lithotripsy Left 09/13/2014    Procedure: EXTRACORPOREAL SHOCK WAVE LITHOTRIPSY (ESWL);  Surgeon: Hollice Espy, MD;  Location: [facility];  Service: Urology;  Laterality: Left;    Prior to Admission medications   Medication Sig Start Date End Date Taking? Authorizing Provider  aspirin EC 81 MG tablet Take 81 mg by mouth daily.   Yes Historical Provider, MD  clopidogrel (PLAVIX) 75 MG tablet Take 75 mg by mouth daily.   Yes Historical Provider, MD  diltiazem (CARDIZEM CD) 120 MG 24 hr capsule Take 120 mg by mouth daily.    Yes Historical Provider, MD  lisinopril-hydrochlorothiazide (PRINZIDE,ZESTORETIC) 20-12.5 MG per tablet Take 1 tablet by mouth daily.   Yes Historical Provider, MD  metoprolol (TOPROL-XL) 200 MG 24 hr tablet Take 200 mg by mouth daily.    Yes Historical Provider, MD  omega-3 acid ethyl esters (LOVAZA) 1 G capsule Take 4 g by mouth daily.    Yes Historical Provider, MD  pantoprazole (PROTONIX) 40 MG tablet Take 40 mg by mouth daily.   Yes Historical Provider, MD  rosuvastatin (CRESTOR) 40 MG tablet Take 40 mg by mouth every evening.   Yes Historical Provider, MD  vitamin B-12 (CYANOCOBALAMIN) 1000 MCG tablet Take 2,000 mcg by  mouth daily.   Yes Historical Provider, MD  HYDROcodone-acetaminophen (NORCO/VICODIN) 5-325 MG per tablet Take 1-2 tablets by mouth every 6 (six) hours as needed for moderate pain. Patient not taking: Reported on 10/12/2014 09/13/14   Hollice Espy, MD  tamsulosin (FLOMAX) 0.4 MG CAPS capsule Take 1 capsule (0.4 mg total) by mouth daily. Patient not taking: Reported on 10/19/2015 09/13/14   Hollice Espy, MD    Family History  Problem Relation Age of Onset  . Heart failure       Social History  Substance Use Topics  . Smoking status: Former Smoker -- 1.00 packs/day for 37 years    Quit  date: 04/21/2013  . Smokeless tobacco: None  . Alcohol Use: 8.4 oz/week    7 Glasses of wine, 7 Cans of beer per week    Allergies as of 10/19/2015 - Review Complete 10/19/2015  Allergen Reaction Noted  . Fenofibrate Other (See Comments) 08/29/2014    Review of Systems:    All systems reviewed and negative except where noted in HPI.   Physical Exam:  Vital signs in last 24 hours: Temp:  [98.2 F (36.8 C)-99.2 F (37.3 C)] 98.3 F (36.8 C) (07/17 1529) Pulse Rate:  [64-109] 90 (07/17 1529) Resp:  [17-22] 18 (07/17 1529) BP: (130-166)/(64-101) 156/71 mmHg (07/17 1529) SpO2:  [91 %-99 %] 97 % (07/17 1529) Weight:  [193 lb 2 oz (87.6 kg)] 193 lb 2 oz (87.6 kg) (07/17 1451) Last BM Date: 10/19/15 General:   Pleasant, cooperative in NAD Head:  Normocephalic and atraumatic. Eyes:   No icterus.   Conjunctiva pink. PERRLA. Ears:  Normal auditory acuity. Neck:  Supple; no masses or thyroidomegaly Lungs: Respirations even and unlabored. Lungs clear to auscultation bilaterally.   No wheezes, crackles, or rhonchi.  Heart:  Regular rate and rhythm;  Without murmur, clicks, rubs or gallops Abdomen:  Soft, nondistended, nontender. Normal bowel sounds. No appreciable masses or hepatomegaly.  No rebound or guarding.  Rectal:  Not performed. Msk:  Symmetrical without gross deformities.    Extremities:  Without edema, cyanosis or clubbing. Neurologic:  Alert and oriented x3;  grossly normal neurologically. Skin:  Intact without significant lesions or rashes. Cervical Nodes:  No significant cervical adenopathy. Psych:  Alert and cooperative. Normal affect.  LAB RESULTS:  Recent Labs  10/19/15 0428 10/20/15 0544 10/21/15 0636  WBC 6.6 5.0 6.5  HGB 7.5* 8.3* 8.2*  HCT 22.3* 24.0* 23.9*  PLT 181 151 154   BMET  Recent Labs  10/19/15 1534 10/20/15 0544 10/21/15 0636  NA 138 140 138  K 6.8* 4.2 3.8  CL 107 105 104  CO2 12* 20* 19*  GLUCOSE 88 88 120*  BUN 132* 89* 88*    CREATININE 18.79* 13.10* 12.82*  CALCIUM 7.3* 7.1* 6.3*   LFT  Recent Labs  10/19/15 0428  10/21/15 0636  PROT 7.0  --   --   ALBUMIN 3.5  < > 2.9*  AST 13*  --   --   ALT 51  --   --   ALKPHOS 102  --   --   BILITOT 0.7  --   --   < > = values in this interval not displayed. PT/INR  Recent Labs  10/19/15 0428 10/20/15 1204  LABPROT 14.8 15.5*  INR 1.14 1.21    STUDIES: No results found.    Impression / Plan:   [name] [name] is a 55 y.o. y/o male with mild pancreatitis that has improved since  admission. The patient also has had anemia with transfusion of blood. The patient has had normal colonoscopies in the past and will be set up for an EGD for tomorrow. The patient also has been encouraged to stop his alcohol use. His stools have been ordered for fecal leukocytes and fecal fat. He has also had pathogen sent off in his stool because of the report of diarrhea. The patient has been explained the plan and agrees with it.  Thank you for involving me in the care of this patient.      LOS: 2 days   Lucilla Lame, MD  10/21/2015, 4:45 PM   Note: This dictation was prepared with Dragon dictation along with smaller phrase technology. Any transcriptional errors that result from this process are unintentional.

## 2015-10-21 NOTE — Progress Notes (Signed)
PPD Skin Test read on LEFT Forearm with a result of NEGATIVE.

## 2015-10-22 ENCOUNTER — Encounter
Admission: EM | Disposition: A | Discharge status: Home / Self Care | Payer: Self-pay | Source: Home / Self Care | Attending: Internal Medicine

## 2015-10-22 ENCOUNTER — Inpatient Hospital Stay: Payer: 59 | Admitting: Certified Registered Nurse Anesthetist

## 2015-10-22 DIAGNOSIS — K766 Portal hypertension: Secondary | ICD-10-CM

## 2015-10-22 DIAGNOSIS — K209 Esophagitis, unspecified without bleeding: Secondary | ICD-10-CM | POA: Insufficient documentation

## 2015-10-22 HISTORY — PX: ESOPHAGOGASTRODUODENOSCOPY (EGD) WITH PROPOFOL: SHX5813

## 2015-10-22 LAB — CBC
HEMATOCRIT: 22.3 % — AB (ref 40.0–52.0)
HEMOGLOBIN: 7.8 g/dL — AB (ref 13.0–18.0)
MCH: 32.8 pg (ref 26.0–34.0)
MCHC: 34.9 g/dL (ref 32.0–36.0)
MCV: 94 fL (ref 80.0–100.0)
PLATELETS: 143 10*3/uL — AB (ref 150–440)
RBC: 2.37 MIL/uL — AB (ref 4.40–5.90)
RDW: 15.1 % — ABNORMAL HIGH (ref 11.5–14.5)
WBC: 6.1 10*3/uL (ref 3.8–10.6)

## 2015-10-22 LAB — BASIC METABOLIC PANEL
ANION GAP: 10 (ref 5–15)
BUN: 42 mg/dL — AB (ref 6–20)
CHLORIDE: 104 mmol/L (ref 101–111)
CO2: 27 mmol/L (ref 22–32)
Calcium: 7.5 mg/dL — ABNORMAL LOW (ref 8.9–10.3)
Creatinine, Ser: 8.39 mg/dL — ABNORMAL HIGH (ref 0.61–1.24)
GFR calc Af Amer: 7 mL/min — ABNORMAL LOW (ref >60)
GFR, EST NON AFRICAN AMERICAN: 6 mL/min — AB (ref >60)
GLUCOSE: 107 mg/dL — AB (ref 65–99)
POTASSIUM: 3.5 mmol/L (ref 3.5–5.1)
Sodium: 141 mmol/L (ref 135–145)

## 2015-10-22 LAB — PROTEIN ELECTROPHORESIS, SERUM
A/G RATIO SPE: 0.9 (ref 0.7–1.7)
Albumin ELP: 3.1 g/dL (ref 2.9–4.4)
Alpha-1-Globulin: 0.3 g/dL (ref 0.0–0.4)
Alpha-2-Globulin: 1.2 g/dL — ABNORMAL HIGH (ref 0.4–1.0)
Beta Globulin: 1 g/dL (ref 0.7–1.3)
GLOBULIN, TOTAL: 3.3 g/dL (ref 2.2–3.9)
Gamma Globulin: 0.9 g/dL (ref 0.4–1.8)
TOTAL PROTEIN ELP: 6.4 g/dL (ref 6.0–8.5)

## 2015-10-22 SURGERY — ESOPHAGOGASTRODUODENOSCOPY (EGD) WITH PROPOFOL
Anesthesia: General

## 2015-10-22 MED ORDER — PROPOFOL 500 MG/50ML IV EMUL
INTRAVENOUS | Status: DC | PRN
  Administered 2015-10-22: 150 ug/kg/min via INTRAVENOUS

## 2015-10-22 MED ORDER — FENTANYL CITRATE (PF) 100 MCG/2ML IJ SOLN
INTRAMUSCULAR | Status: DC | PRN
  Administered 2015-10-22: 50 ug via INTRAVENOUS

## 2015-10-22 MED ORDER — PROPOFOL 10 MG/ML IV BOLUS
INTRAVENOUS | Status: DC | PRN
  Administered 2015-10-22: 60 mg via INTRAVENOUS

## 2015-10-22 MED ORDER — METOPROLOL TARTRATE 25 MG PO TABS
25.0000 mg | ORAL_TABLET | Freq: Once | ORAL | Status: AC
  Administered 2015-10-22: 25 mg via ORAL
  Filled 2015-10-22: qty 1

## 2015-10-22 MED ORDER — LIDOCAINE HCL (CARDIAC) 20 MG/ML IV SOLN
INTRAVENOUS | Status: DC | PRN
  Administered 2015-10-22: 100 mg via INTRAVENOUS

## 2015-10-22 NOTE — Transfer of Care (Signed)
Immediate Anesthesia Transfer of Care Note  Patient: [name] [name]  Procedure(s) Performed: Procedure(s): ESOPHAGOGASTRODUODENOSCOPY (EGD) WITH PROPOFOL (N/A)  Patient Location: PACU  Anesthesia Type:General  Level of Consciousness: awake, alert  and oriented  Airway & Oxygen Therapy: Patient Spontanous Breathing and Patient connected to nasal cannula oxygen  Post-op Assessment: Report given to RN and Post -op Vital signs reviewed and stable  Post vital signs: Reviewed and stable  Last Vitals:  Filed Vitals:   10/22/15 0449 10/22/15 0952  BP: 136/75 130/75  Pulse: 76 75  Temp: 36.8 C 37.5 C  Resp: 20 20    Last Pain:  Filed Vitals:   10/22/15 0953  PainSc: 0-No pain      Patients Stated Pain Goal: 0 [phone]  Complications: No apparent anesthesia complications

## 2015-10-22 NOTE — Anesthesia Preprocedure Evaluation (Signed)
Anesthesia Evaluation  Patient identified by MRN, date of birth, ID band Patient awake    Reviewed: Allergy & Precautions, H&P , NPO status , Patient's Chart, lab work & pertinent test results, reviewed documented beta blocker date and time   Airway Mallampati: II   Neck ROM: full    Dental  (+) Teeth Intact   Pulmonary neg pulmonary ROS, former smoker,    Pulmonary exam normal        Cardiovascular hypertension, + Peripheral Vascular Disease  negative cardio ROS Normal cardiovascular exam Rhythm:regular Rate:Normal     Neuro/Psych negative neurological ROS  negative psych ROS   GI/Hepatic negative GI ROS, Neg liver ROS, GERD  Medicated,(+) Hepatitis -  Endo/Other  negative endocrine ROS  Renal/GU Dialysis and ESRFRenal diseasenegative Renal ROS  negative genitourinary   Musculoskeletal   Abdominal   Peds  Hematology negative hematology ROS (+) anemia ,   Anesthesia Other Findings Past Medical History:   Hypertriglyceridemia                                         Chest pain                                                     Comment:Unspecified   Palpitations                                                 Hypertension                                                 Peripheral vascular disease ([facility])                            Chronic kidney disease                                       Kidney stone on left side                                  Past Surgical History:   lower extremity interventions x2 right leg      Right              PERIPHERAL VASCULAR CATHETERIZATION             Right 08/20/2014      Comment:Procedure: Lower Extremity Angiography;                Surgeon: Algernon Huxley, MD;  Location: [facility];  Service: Cardiovascular;                Laterality: Right;   PERIPHERAL VASCULAR CATHETERIZATION  Right 08/20/2014      Comment:Procedure: Lower Extremity  Intervention;                Surgeon: Algernon Huxley, MD;  Location: [facility];  Service: Cardiovascular;                Laterality: Right;   EXTRACORPOREAL SHOCK WAVE LITHOTRIPSY                         EXTRACORPOREAL SHOCK WAVE LITHOTRIPSY           Left 09/13/2014       Comment:Procedure: EXTRACORPOREAL SHOCK WAVE               LITHOTRIPSY (ESWL);  Surgeon: Hollice Espy,               MD;  Location: [facility];  Service: Urology;                Laterality: Left; BMI    Body Mass Index   26.02 kg/m 2     Reproductive/Obstetrics                             Anesthesia Physical Anesthesia Plan  ASA: III  Anesthesia Plan: General   Post-op Pain Management:    Induction:   Airway Management Planned:   Additional Equipment:   Intra-op Plan:   Post-operative Plan:   Informed Consent: I have reviewed the patients History and Physical, chart, labs and discussed the procedure including the risks, benefits and alternatives for the proposed anesthesia with the patient or authorized representative who has indicated his/her understanding and acceptance.   Dental Advisory Given  Plan Discussed with: CRNA  Anesthesia Plan Comments:         Anesthesia Quick Evaluation

## 2015-10-22 NOTE — Progress Notes (Signed)
Dr. Marcille Blanco notified of hgb of 7.8 this am; Barbaraann Faster, RN; [phone]; 10/22/2015

## 2015-10-22 NOTE — Progress Notes (Signed)
[facility] NAME: [name] [name]    MR#:  [id number]  DATE OF BIRTH:  10/31/1960  SUBJECTIVE:  CHIEF COMPLAINT:   Chief Complaint  Patient presents with  . Abnormal Lab  Had EGD earlier which showed portal hypertensive gastropathy and some gastritis, hemoglobin 7.8, has good urine output so dialysis on hold REVIEW OF SYSTEMS:  Review of Systems  Constitutional: Positive for malaise/fatigue. Negative for fever, weight loss and diaphoresis.  HENT: Negative for ear discharge, ear pain, hearing loss, nosebleeds, sore throat and tinnitus.   Eyes: Negative for blurred vision and pain.  Respiratory: Negative for cough, hemoptysis, shortness of breath and wheezing.   Cardiovascular: Negative for chest pain, palpitations, orthopnea and leg swelling.  Gastrointestinal: Negative for heartburn, nausea, vomiting, abdominal pain, diarrhea, constipation and blood in stool.  Genitourinary: Negative for dysuria, urgency and frequency.  Musculoskeletal: Negative for myalgias and back pain.  Skin: Negative for itching and rash.  Neurological: Positive for weakness. Negative for dizziness, tingling, tremors, focal weakness, seizures and headaches.  Psychiatric/Behavioral: Negative for depression. The patient is not nervous/anxious.    DRUG ALLERGIES:   Allergies  Allergen Reactions  . Fenofibrate Other (See Comments)   VITALS:  Blood pressure 115/79, pulse 92, temperature 99.3 F (37.4 C), temperature source Oral, resp. rate 17, height 6' (1.829 m), weight 87.045 kg (191 lb 14.4 oz), SpO2 95 %. PHYSICAL EXAMINATION:  Physical Exam  Constitutional: He is oriented to person, place, and time and well-developed, well-nourished, and in no distress.  HENT:  Head: Normocephalic and atraumatic.  Eyes: Conjunctivae and EOM are normal. Pupils are equal, round, and reactive to light.  Neck: Normal range of motion. Neck supple. No tracheal deviation  present. No thyromegaly present.  Cardiovascular: Normal rate, regular rhythm and normal heart sounds.   Pulmonary/Chest: Effort normal and breath sounds normal. No respiratory distress. He has no wheezes. He exhibits no tenderness.  Abdominal: Soft. Bowel sounds are normal. He exhibits no distension. There is no tenderness.  Musculoskeletal: Normal range of motion.  Neurological: He is alert and oriented to person, place, and time. No cranial nerve deficit.  Skin: Skin is warm and dry. No rash noted.  Psychiatric: Mood and affect normal.   LABORATORY PANEL:   CBC  Recent Labs Lab 10/22/15 0607  WBC 6.1  HGB 7.8*  HCT 22.3*  PLT 143*   ------------------------------------------------------------------------------------------------------------------ Chemistries   Recent Labs Lab 10/19/15 0428  10/22/15 0607  NA 137  < > 141  K 5.9*  < > 3.5  CL 105  < > 104  CO2 12*  < > 27  GLUCOSE 96  < > 107*  BUN 135*  < > 42*  CREATININE 18.38*  < > 8.39*  CALCIUM 7.5*  < > 7.5*  AST 13*  --   --   ALT 51  --   --   ALKPHOS 102  --   --   BILITOT 0.7  --   --   < > = values in this interval not displayed. RADIOLOGY:  No results found. ASSESSMENT AND PLAN:  This is a 55 year old male admitted for acute renal failure and pancreatitis.  1. Acute renal failure: Of unknown etiology at this time - Most of the lab work so far has been negative - Urine microscopic exam showed muddy brown casts suggestive of ATN - Dialysis per nephrology, has good urine output so dialysis on hold for now.  -  May not need kidney biopsy either.  If creatinine continues to improve  2. Pancreatitis: amylase and lipase coming down, now resolved.  Could be due to hypertriglyceridemia  3. GI bleed: EGD today showed gastritis and mild portal hypertensive gastropathy   4. Anemia: possibility of slow GI bleed, hold Asa, Plavix. S/p 2 PRBC transfusion - hemoglobin did not respond appropriately - up to  7.8  5. Essential hypertension: Stop lisinopril - hydrochlorothiazide. Continue metoprolol and diltiazem  6. Hypercholesterolemia: Continue crestor 7. BPH: Continue Flomax 8.  Multiple kidney stones: Nonobstructive, can follow up with urology as an outpatient 9. Hyperkalemia: Resolved with dialysis GI prophylaxis: As above DVT prophylaxis: None as the patient has an active GI bleed   Discontinue telemetry   All the records are reviewed and case discussed with Care Management/Social Worker. Management plans discussed with the patient and nursing.  Discussed with Dr. Candiss Norse  CODE STATUS: FULL [place] THIS PATIENT: 35 minutes.   More than 50% of the time was spent in counseling/coordination of care: YES  POSSIBLE D/C IN AM, DEPENDING ON CLINICAL CONDITION. AND KIDNEY FUNCTION   Manuella Ghazi, Harsh Trulock M.D on 10/22/2015 at 4:39 PM  Between 7am to 6pm - Pager - [phone]  After 6pm go to [email or web address] - Proofreader  [facility]  Office  [phone]  CC: Primary care physician; Kirk Ruths., MD  Note: This dictation was prepared with Dragon dictation along with smaller phrase technology. Any transcriptional errors that result from this process are unintentional.

## 2015-10-22 NOTE — Op Note (Signed)
[facility] Gastroenterology Patient Name: [name] [name] Procedure Date: 10/22/2015 10:42 AM MRN: [id number] Account #: [id number] Date of Birth: [birth year] Admit Type: Outpatient Age: 55 Room: [facility] ENDO ROOM 4 Gender: Male Note Status: Finalized Procedure:            Upper GI endoscopy Indications:          Acute post hemorrhagic anemia Providers:            Lucilla Lame MD, MD Referring MD:         Ocie Cornfield. Ouida Sills MD, MD (Referring MD) Medicines:            Propofol per Anesthesia Complications:        No immediate complications. Procedure:            Pre-Anesthesia Assessment:                       - Prior to the procedure, a History and Physical was                        performed, and patient medications and allergies were                        reviewed. The patient's tolerance of previous                        anesthesia was also reviewed. The risks and benefits of                        the procedure and the sedation options and risks were                        discussed with the patient. All questions were                        answered, and informed consent was obtained. Prior                        Anticoagulants: The patient has taken no previous                        anticoagulant or antiplatelet agents. ASA Grade                        Assessment: II - A patient with mild systemic disease.                        After reviewing the risks and benefits, the patient was                        deemed in satisfactory condition to undergo the                        procedure.                       After obtaining informed consent, the endoscope was                        passed under direct vision. Throughout the procedure,  the patient's blood pressure, pulse, and oxygen                        saturations were monitored continuously. The Endoscope                        was introduced through the mouth, and advanced to  the                        second part of duodenum. The upper GI endoscopy was                        accomplished without difficulty. The patient tolerated                        the procedure well. Findings:      LA Grade A (one or more mucosal breaks less than 5 mm, not extending       between tops of 2 mucosal folds) esophagitis with no bleeding was found       in the distal esophagus.      Mild portal hypertensive gastropathy was found in the entire examined       stomach.      The examined duodenum was normal. Impression:           - LA Grade A esophagitis.                       - Portal hypertensive gastropathy.                       - Normal examined duodenum.                       - No specimens collected. Recommendation:       - Return patient to hospital ward for ongoing care. Procedure Code(s):    --- Professional ---                       [phone], Esophagogastroduodenoscopy, flexible, transoral;                        diagnostic, including collection of specimen(s) by                        brushing or washing, when performed (separate procedure) Diagnosis Code(s):    --- Professional ---                       D62, Acute posthemorrhagic anemia                       K20.9, Esophagitis, unspecified                       K76.6, Portal hypertension                       K31.89, Other diseases of stomach and duodenum CPT copyright 2016 American Medical Association. All rights reserved. The codes documented in this report are preliminary and upon coder review may  be revised to meet current compliance requirements. Lucilla Lame MD, MD 10/22/2015 10:53:23 AM This report has been signed electronically. Number of Addenda: 0 Note  Initiated On: 10/22/2015 10:42 AM      [facility]

## 2015-10-22 NOTE — Progress Notes (Signed)
Subjective:  Patient feels well today No acute complaints No shortness of breath or lower extremity edema Urine output reported  1950cc last 24 hours Underwent dialysis successfully yesterday. No adverse events.  Upper endoscopy completed today. It shows portal hypertensive gastropathy   Objective:  Vital signs in last 24 hours:  Temp:  [97.8 F (36.6 C)-99.5 F (37.5 C)] 98.8 F (37.1 C) (07/18 1155) Pulse Rate:  [64-92] 82 (07/18 1155) Resp:  [14-22] 16 (07/18 1155) BP: (119-166)/(70-97) 139/74 mmHg (07/18 1155) SpO2:  [91 %-98 %] 96 % (07/18 1155) Weight:  [87.045 kg (191 lb 14.4 oz)-87.6 kg (193 lb 2 oz)] 87.045 kg (191 lb 14.4 oz) (07/18 0448)  Weight change: 0 kg (0 lb) Filed Weights   10/21/15 1213 10/21/15 1451 10/22/15 0448  Weight: 87.6 kg (193 lb 2 oz) 87.6 kg (193 lb 2 oz) 87.045 kg (191 lb 14.4 oz)    Intake/Output:    Intake/Output Summary (Last 24 hours) at 10/22/15 1231 Last data filed at 10/22/15 1053  Gross per 24 hour  Intake 2292.25 ml  Output   1550 ml  Net 742.25 ml     Physical Exam: General: No acute distress  HEENT anicteric, moist mucous membranes  Neck supple  Pulm/lungs Normal breathing effort, clear to auscultation bilaterally  CVS/Heart Regular, no rub or gallop  Abdomen:  Soft, nontender  Extremities:  no peripheral edema  Neurologic: Alert, oriented  Skin: no acute rashes  Access: leftfemoral temporary catheter       Basic Metabolic Panel:   Recent Labs Lab 10/19/15 0428 10/19/15 1534 10/20/15 0544 10/21/15 0636 10/22/15 0607  NA 137 138 140 138 141  K 5.9* 6.8* 4.2 3.8 3.5  CL 105 107 105 104 104  CO2 12* 12* 20* 19* 27  GLUCOSE 96 88 88 120* 107*  BUN 135* 132* 89* 88* 42*  CREATININE 18.38* 18.79* 13.10* 12.82* 8.39*  CALCIUM 7.5* 7.3* 7.1* 6.3* 7.5*  PHOS  --   --  8.9* 8.4*  --      CBC:  Recent Labs Lab 10/19/15 0428 10/20/15 0544 10/21/15 0636 10/22/15 0607  WBC 6.6 5.0 6.5 6.1  NEUTROABS 4.6   --   --   --   HGB 7.5* 8.3* 8.2* 7.8*  HCT 22.3* 24.0* 23.9* 22.3*  MCV 101.2* 95.1 93.9 94.0  PLT 181 151 154 143*      Microbiology:  No results found for this or any previous visit (from the past 720 hour(s)).  Coagulation Studies:  Recent Labs  10/20/15 1204  LABPROT 15.5*  INR 1.21    Urinalysis: No results for input(s): COLORURINE, LABSPEC, PHURINE, GLUCOSEU, HGBUR, BILIRUBINUR, KETONESUR, PROTEINUR, UROBILINOGEN, NITRITE, LEUKOCYTESUR in the last 72 hours.  Invalid input(s): APPERANCEUR    Imaging: No results found.   Medications:   . sodium chloride 75 mL/hr at 10/22/15 0448   . diltiazem  120 mg Oral Daily  . docusate sodium  100 mg Oral BID  . folic acid  1 mg Oral Daily  . heparin  5,000 Units Intravenous Once  . metoprolol  200 mg Oral Daily  . multivitamin with minerals  1 tablet Oral Daily  . omega-3 acid ethyl esters  4 g Oral Daily  . pantoprazole (PROTONIX) IV  40 mg Intravenous Q12H  . rosuvastatin  40 mg Oral QPM  . sodium bicarbonate  1,300 mg Oral BID  . sodium chloride flush  3 mL Intravenous Q12H  . tamsulosin  0.4 mg Oral Daily  .  thiamine  100 mg Oral Daily  . vitamin B-12  2,000 mcg Oral Daily   acetaminophen **OR** acetaminophen, morphine injection, ondansetron **OR** ondansetron (ZOFRAN) IV, oxyCODONE  Assessment/ Plan:  55 y.o. male originally from [place], history of alcohol abuse, hepatitis, peripheral vascular disease, hypertension, hyperlipidemia, BPH, admitted to [facility] on July 15 for acute renal failure   1. Acute Renal Failure: baseline creatinine of 1 on 06/07/15 2. Hyperkalemia 3. Metabolic acidosis 4. Anemia with renal failure: 2 units PRBC on 7/15  5. Hypocalcemia: corrected calcium of 7.5 6.  Acute pancreatitis 7.  Hyperlipidemia, severe  Serological studies have been negative so far.  Urine microscopic exam performed by Dr. Juleen China showed muddy brown cast suggesting ATN. Urine output is improving.  Will continue to monitor renal function on a daily basis.   - hold further HD  - Continue to monitor urine output and electrolytes closely. avoid oral calcium supplements due to acute pancreatitis   LOS: 3 [name] [name] 7/18/201712:31 PM

## 2015-10-23 ENCOUNTER — Encounter: Payer: Self-pay | Admitting: Gastroenterology

## 2015-10-23 LAB — RENAL FUNCTION PANEL
ANION GAP: 8 (ref 5–15)
Albumin: 2.7 g/dL — ABNORMAL LOW (ref 3.5–5.0)
BUN: 47 mg/dL — ABNORMAL HIGH (ref 6–20)
CHLORIDE: 107 mmol/L (ref 101–111)
CO2: 28 mmol/L (ref 22–32)
Calcium: 6.8 mg/dL — ABNORMAL LOW (ref 8.9–10.3)
Creatinine, Ser: 9.15 mg/dL — ABNORMAL HIGH (ref 0.61–1.24)
GFR, EST AFRICAN AMERICAN: 7 mL/min — AB (ref >60)
GFR, EST NON AFRICAN AMERICAN: 6 mL/min — AB (ref >60)
Glucose, Bld: 117 mg/dL — ABNORMAL HIGH (ref 65–99)
POTASSIUM: 3 mmol/L — AB (ref 3.5–5.1)
Phosphorus: 5.3 mg/dL — ABNORMAL HIGH (ref 2.5–4.6)
Sodium: 143 mmol/L (ref 135–145)

## 2015-10-23 LAB — BASIC METABOLIC PANEL
ANION GAP: 12 (ref 5–15)
BUN: 48 mg/dL — ABNORMAL HIGH (ref 6–20)
CALCIUM: 6.8 mg/dL — AB (ref 8.9–10.3)
CO2: 26 mmol/L (ref 22–32)
Chloride: 105 mmol/L (ref 101–111)
Creatinine, Ser: 9.09 mg/dL — ABNORMAL HIGH (ref 0.61–1.24)
GFR calc Af Amer: 7 mL/min — ABNORMAL LOW (ref >60)
GFR, EST NON AFRICAN AMERICAN: 6 mL/min — AB (ref >60)
GLUCOSE: 137 mg/dL — AB (ref 65–99)
POTASSIUM: 3 mmol/L — AB (ref 3.5–5.1)
SODIUM: 143 mmol/L (ref 135–145)

## 2015-10-23 LAB — IRON AND TIBC
IRON: 43 ug/dL — AB (ref 45–182)
SATURATION RATIOS: 18 % (ref 17.9–39.5)
TIBC: 237 ug/dL — ABNORMAL LOW (ref 250–450)
UIBC: 194 ug/dL

## 2015-10-23 LAB — FECAL FAT, QUALITATIVE
Fat Qual Neutral, Stl: INCREASED
Fat Qual Total, Stl: INCREASED

## 2015-10-23 LAB — PROTEIN / CREATININE RATIO, URINE
CREATININE, URINE: 43 mg/dL
Protein Creatinine Ratio: 0.63 mg/mg{Cre} — ABNORMAL HIGH (ref 0.00–0.15)
TOTAL PROTEIN, URINE: 27 mg/dL

## 2015-10-23 LAB — CBC
HCT: 21.9 % — ABNORMAL LOW (ref 40.0–52.0)
Hemoglobin: 7.5 g/dL — ABNORMAL LOW (ref 13.0–18.0)
MCH: 33 pg (ref 26.0–34.0)
MCHC: 34.4 g/dL (ref 32.0–36.0)
MCV: 95.9 fL (ref 80.0–100.0)
PLATELETS: 150 10*3/uL (ref 150–440)
RBC: 2.28 MIL/uL — AB (ref 4.40–5.90)
RDW: 15.2 % — AB (ref 11.5–14.5)
WBC: 8.7 10*3/uL (ref 3.8–10.6)

## 2015-10-23 LAB — TRANSFERRIN: Transferrin: 175 mg/dL — ABNORMAL LOW (ref 180–329)

## 2015-10-23 LAB — FERRITIN: Ferritin: 1194 ng/mL — ABNORMAL HIGH (ref 24–336)

## 2015-10-23 MED ORDER — AMLODIPINE BESYLATE 5 MG PO TABS
5.0000 mg | ORAL_TABLET | Freq: Every day | ORAL | Status: DC
  Administered 2015-10-23 – 2015-10-24 (×2): 5 mg via ORAL
  Filled 2015-10-23 (×2): qty 1

## 2015-10-23 MED ORDER — POTASSIUM CHLORIDE CRYS ER 20 MEQ PO TBCR
40.0000 meq | EXTENDED_RELEASE_TABLET | Freq: Once | ORAL | Status: AC
  Administered 2015-10-23: 40 meq via ORAL
  Filled 2015-10-23: qty 2

## 2015-10-23 NOTE — Progress Notes (Signed)
[facility] [place] NAME: [name] [name]    MR#:  [id number]  DATE OF BIRTH:  20-Apr-1960  SUBJECTIVE:  CHIEF COMPLAINT:   Chief Complaint  Patient presents with  . Abnormal Lab  Creatinine went up this morning, potassium low, he is feeling better REVIEW OF SYSTEMS:  Review of Systems  Constitutional: Negative for fever, weight loss and diaphoresis.  HENT: Negative for ear discharge, ear pain, hearing loss, nosebleeds, sore throat and tinnitus.   Eyes: Negative for blurred vision and pain.  Respiratory: Negative for cough, hemoptysis, shortness of breath and wheezing.   Cardiovascular: Negative for chest pain, palpitations, orthopnea and leg swelling.  Gastrointestinal: Negative for heartburn, nausea, vomiting, abdominal pain, diarrhea, constipation and blood in stool.  Genitourinary: Negative for dysuria, urgency and frequency.  Musculoskeletal: Negative for myalgias and back pain.  Skin: Negative for itching and rash.  Neurological: Negative for dizziness, tingling, tremors, focal weakness, seizures and headaches.  Psychiatric/Behavioral: Negative for depression. The patient is not nervous/anxious.    DRUG ALLERGIES:   Allergies  Allergen Reactions  . Fenofibrate Other (See Comments)   VITALS:  Blood pressure 143/73, pulse 86, temperature 99.4 F (37.4 C), temperature source Oral, resp. rate 18, height 6' (1.829 m), weight 85.821 kg (189 lb 3.2 oz), SpO2 99 %. PHYSICAL EXAMINATION:  Physical Exam  Constitutional: He is oriented to person, place, and time and well-developed, well-nourished, and in no distress.  HENT:  Head: Normocephalic and atraumatic.  Eyes: Conjunctivae and EOM are normal. Pupils are equal, round, and reactive to light.  Neck: Normal range of motion. Neck supple. No tracheal deviation present. No thyromegaly present.  Cardiovascular: Normal rate, regular rhythm and normal heart sounds.   Pulmonary/Chest:  Effort normal and breath sounds normal. No respiratory distress. He has no wheezes. He exhibits no tenderness.  Abdominal: Soft. Bowel sounds are normal. He exhibits no distension. There is no tenderness.  Musculoskeletal: Normal range of motion.  Neurological: He is alert and oriented to person, place, and time. No cranial nerve deficit.  Skin: Skin is warm and dry. No rash noted.  Psychiatric: Mood and affect normal.   LABORATORY PANEL:   CBC  Recent Labs Lab 10/23/15 0420  WBC 8.7  HGB 7.5*  HCT 21.9*  PLT 150   ------------------------------------------------------------------------------------------------------------------ Chemistries   Recent Labs Lab 10/19/15 0428  10/23/15 0420  NA 137  < > 143  K 5.9*  < > 3.0*  CL 105  < > 107  CO2 12*  < > 28  GLUCOSE 96  < > 117*  BUN 135*  < > 47*  CREATININE 18.38*  < > 9.15*  CALCIUM 7.5*  < > 6.8*  AST 13*  --   --   ALT 51  --   --   ALKPHOS 102  --   --   BILITOT 0.7  --   --   < > = values in this interval not displayed. RADIOLOGY:  No results found. ASSESSMENT AND PLAN:  This is a 55 year old male admitted for acute renal failure and pancreatitis.  1. Acute renal failure: Of unknown etiology at this time, Most of the lab work so far has been negative - Urine microscopic exam showed muddy brown casts suggestive of ATN - Dialysis per nephrology, has good urine output so dialysis on hold for now.  - May not need kidney biopsy either. - Creatinine went up this morning, so hold off discharge and  monitor his kidney function  2. Pancreatitis: now resolved.  Could be due to hypertriglyceridemia  3. GI bleed: EGD 7/18 showed gastritis and mild portal hypertensive gastropathy   4. Anemia: possibility of slow GI bleed, hold Asa, Plavix. S/p 2 PRBC transfusion - hemoglobin did not respond appropriately - up to 7.5  5. Essential hypertension: Stop lisinopril - hydrochlorothiazide. Continue metoprolol and diltiazem  6.  Hypercholesterolemia: Continue crestor 7. BPH: Continue Flomax 8.  Multiple kidney stones: Nonobstructive, can follow up with urology as an outpatient 9. Hyperkalemia: Resolved with dialysis GI prophylaxis: As above DVT prophylaxis: None as the patient has an active GI bleed    All the records are reviewed and case discussed with Care Management/Social Worker. Management plans discussed with the patient and nursing.  Discussed with Dr. Candiss Norse  CODE STATUS: FULL [place] THIS PATIENT: 35 minutes.   More than 50% of the time was spent in counseling/coordination of care: YES  POSSIBLE D/C IN 1-2 days, DEPENDING ON CLINICAL CONDITION. AND KIDNEY FUNCTION   Manuella Ghazi, Masaru Chamberlin M.D on 10/23/2015 at 2:52 PM  Between 7am to 6pm - Pager - [phone]  After 6pm go to [email or web address] - Proofreader  [facility]  Office  [phone]  CC: Primary care physician; Kirk Ruths., MD  Note: This dictation was prepared with Dragon dictation along with smaller phrase technology. Any transcriptional errors that result from this process are unintentional.

## 2015-10-23 NOTE — Progress Notes (Signed)
Subjective:  Patient feels well today. Urine output 1600cc.  Unfortunately creatinine is higher today. Potassium is low. No nausea or vomiting. No shortness of breath or lower extremity edema.     Objective:  Vital signs in last 24 hours:  Temp:  [97.8 F (36.6 C)-99.3 F (37.4 C)] 98.4 F (36.9 C) (07/19 0434) Pulse Rate:  [82-94] 94 (07/19 0935) Resp:  [14-20] 18 (07/19 0935) BP: (115-146)/(74-97) 141/77 mmHg (07/19 0935) SpO2:  [94 %-96 %] 96 % (07/19 0935) Weight:  [85.821 kg (189 lb 3.2 oz)] 85.821 kg (189 lb 3.2 oz) (07/19 0500)  Weight change: -1.78 kg (-3 lb 14.8 oz) Filed Weights   10/21/15 1451 10/22/15 0448 10/23/15 0500  Weight: 87.6 kg (193 lb 2 oz) 87.045 kg (191 lb 14.4 oz) 85.821 kg (189 lb 3.2 oz)    Intake/Output:    Intake/Output Summary (Last 24 hours) at 10/23/15 1008 Last data filed at 10/23/15 0800  Gross per 24 hour  Intake 2354.6 ml  Output   1600 ml  Net  754.6 ml     Physical Exam: General: No acute distress  HEENT anicteric, moist mucous membranes  Neck supple  Pulm/lungs Normal breathing effort, clear to auscultation bilaterally  CVS/Heart Regular, no rub or gallop  Abdomen:  Soft, nontender  Extremities:  no peripheral edema  Neurologic: Alert, oriented  Skin: no acute rashes  Access: leftfemoral temporary catheter       Basic Metabolic Panel:   Recent Labs Lab 10/19/15 1534 10/20/15 0544 10/21/15 0636 10/22/15 0607 10/23/15 0420  NA 138 140 138 141 143  K 6.8* 4.2 3.8 3.5 3.0*  CL 107 105 104 104 107  CO2 12* 20* 19* 27 28  GLUCOSE 88 88 120* 107* 117*  BUN 132* 89* 88* 42* 47*  CREATININE 18.79* 13.10* 12.82* 8.39* 9.15*  CALCIUM 7.3* 7.1* 6.3* 7.5* 6.8*  PHOS  --  8.9* 8.4*  --  5.3*     CBC:  Recent Labs Lab 10/19/15 0428 10/20/15 0544 10/21/15 0636 10/22/15 0607 10/23/15 0420  WBC 6.6 5.0 6.5 6.1 8.7  NEUTROABS 4.6  --   --   --   --   HGB 7.5* 8.3* 8.2* 7.8* 7.5*  HCT 22.3* 24.0* 23.9* 22.3*  21.9*  MCV 101.2* 95.1 93.9 94.0 95.9  PLT 181 151 154 143* 150      Microbiology:  No results found for this or any previous visit (from the past 720 hour(s)).  Coagulation Studies:  Recent Labs  10/20/15 1204  LABPROT 15.5*  INR 1.21    Urinalysis: No results for input(s): COLORURINE, LABSPEC, PHURINE, GLUCOSEU, HGBUR, BILIRUBINUR, KETONESUR, PROTEINUR, UROBILINOGEN, NITRITE, LEUKOCYTESUR in the last 72 hours.  Invalid input(s): APPERANCEUR    Imaging: No results found.   Medications:   . sodium chloride 75 mL/hr at 10/23/15 0633   . diltiazem  120 mg Oral Daily  . docusate sodium  100 mg Oral BID  . folic acid  1 mg Oral Daily  . heparin  5,000 Units Intravenous Once  . metoprolol  200 mg Oral Daily  . multivitamin with minerals  1 tablet Oral Daily  . omega-3 acid ethyl esters  4 g Oral Daily  . pantoprazole (PROTONIX) IV  40 mg Intravenous Q12H  . rosuvastatin  40 mg Oral QPM  . sodium bicarbonate  1,300 mg Oral BID  . sodium chloride flush  3 mL Intravenous Q12H  . tamsulosin  0.4 mg Oral Daily  . thiamine  100 mg Oral Daily  . vitamin B-12  2,000 mcg Oral Daily   acetaminophen **OR** acetaminophen, ondansetron **OR** ondansetron (ZOFRAN) IV  Assessment/ Plan:  55 y.o. male originally from [place], history of alcohol abuse, hepatitis, peripheral vascular disease, hypertension, hyperlipidemia, BPH, admitted to [facility] on July 15 for acute renal failure   1. Acute Renal Failure: baseline creatinine of 1 on 06/07/15 2. Hyperkalemia, now hypokalemia 3. Metabolic acidosis 4. Anemia with renal failure: 2 units PRBC on 7/15  5. Hypocalcemia: calcium of 6.8 6.  Acute pancreatitis 7.  Hyperlipidemia, severe  Serological studies have been negative so far.  Urine microscopic exam performed by Dr. Juleen China showed muddy brown cast suggesting ATN. Urine output is improving. Will continue to monitor renal function on a daily basis.   - hold further HD   - Continue to monitor urine output and electrolytes closely. avoid oral calcium supplements due to acute pancreatitis - Replace potassium as needed.  - Check iron studies. - Check creatinine later today.   LOS: 4 Yurem Viner 7/19/201710:08 AM

## 2015-10-23 NOTE — Care Management (Signed)
Barrier to discharge.  Cr elevated. Hgb decreased.

## 2015-10-23 NOTE — Anesthesia Postprocedure Evaluation (Signed)
Anesthesia Post Note  Patient: ZADE NORDHOFF  Procedure(s) Performed: Procedure(s) (LRB): ESOPHAGOGASTRODUODENOSCOPY (EGD) WITH PROPOFOL (N/A)  Patient location during evaluation: PACU Anesthesia Type: General Level of consciousness: awake and alert Pain management: pain level controlled Vital Signs Assessment: post-procedure vital signs reviewed and stable Respiratory status: spontaneous breathing, nonlabored ventilation, respiratory function stable and patient connected to nasal cannula oxygen Cardiovascular status: blood pressure returned to baseline and stable Postop Assessment: no signs of nausea or vomiting Anesthetic complications: no    Last Vitals:  Filed Vitals:   10/23/15 1315 10/23/15 1500  BP: 143/73 140/74  Pulse: 86 90  Temp: 37.4 C   Resp: 18 18    Last Pain:  Filed Vitals:   10/23/15 1526  PainSc: 1                  Molli Barrows

## 2015-10-24 LAB — RENAL FUNCTION PANEL
ALBUMIN: 3 g/dL — AB (ref 3.5–5.0)
Anion gap: 11 (ref 5–15)
BUN: 43 mg/dL — AB (ref 6–20)
CO2: 24 mmol/L (ref 22–32)
CREATININE: 8.35 mg/dL — AB (ref 0.61–1.24)
Calcium: 6.4 mg/dL — CL (ref 8.9–10.3)
Chloride: 106 mmol/L (ref 101–111)
GFR calc Af Amer: 7 mL/min — ABNORMAL LOW (ref >60)
GFR, EST NON AFRICAN AMERICAN: 6 mL/min — AB (ref >60)
Glucose, Bld: 116 mg/dL — ABNORMAL HIGH (ref 65–99)
PHOSPHORUS: 3.1 mg/dL (ref 2.5–4.6)
POTASSIUM: 2.9 mmol/L — AB (ref 3.5–5.1)
Sodium: 141 mmol/L (ref 135–145)

## 2015-10-24 LAB — CBC
HCT: 23 % — ABNORMAL LOW (ref 40.0–52.0)
Hemoglobin: 7.7 g/dL — ABNORMAL LOW (ref 13.0–18.0)
MCH: 31.6 pg (ref 26.0–34.0)
MCHC: 33.5 g/dL (ref 32.0–36.0)
MCV: 94.1 fL (ref 80.0–100.0)
PLATELETS: 172 10*3/uL (ref 150–440)
RBC: 2.44 MIL/uL — ABNORMAL LOW (ref 4.40–5.90)
RDW: 15.2 % — AB (ref 11.5–14.5)
WBC: 12 10*3/uL — AB (ref 3.8–10.6)

## 2015-10-24 LAB — BASIC METABOLIC PANEL
ANION GAP: 11 (ref 5–15)
BUN: 43 mg/dL — ABNORMAL HIGH (ref 6–20)
CALCIUM: 6.4 mg/dL — AB (ref 8.9–10.3)
CO2: 24 mmol/L (ref 22–32)
CREATININE: 8.14 mg/dL — AB (ref 0.61–1.24)
Chloride: 106 mmol/L (ref 101–111)
GFR calc non Af Amer: 7 mL/min — ABNORMAL LOW (ref >60)
GFR, EST AFRICAN AMERICAN: 8 mL/min — AB (ref >60)
Glucose, Bld: 116 mg/dL — ABNORMAL HIGH (ref 65–99)
Potassium: 3 mmol/L — ABNORMAL LOW (ref 3.5–5.1)
Sodium: 141 mmol/L (ref 135–145)

## 2015-10-24 MED ORDER — CLONIDINE HCL 0.1 MG PO TABS
0.2000 mg | ORAL_TABLET | Freq: Two times a day (BID) | ORAL | Status: DC
  Administered 2015-10-24 – 2015-10-25 (×3): 0.2 mg via ORAL
  Filled 2015-10-24 (×3): qty 2

## 2015-10-24 MED ORDER — POTASSIUM CHLORIDE 20 MEQ PO PACK
40.0000 meq | PACK | Freq: Once | ORAL | Status: AC
  Administered 2015-10-24: 40 meq via ORAL
  Filled 2015-10-24: qty 2

## 2015-10-24 MED ORDER — AMLODIPINE BESYLATE 10 MG PO TABS: 10.0000 mg | ORAL_TABLET | Freq: Every day | ORAL | Status: DC

## 2015-10-24 MED ORDER — CLONIDINE HCL 0.1 MG PO TABS
0.1000 mg | ORAL_TABLET | Freq: Two times a day (BID) | ORAL | Status: DC
  Administered 2015-10-24: 0.1 mg via ORAL
  Filled 2015-10-24: qty 1

## 2015-10-24 MED ORDER — BUTALBITAL-APAP-CAFFEINE 50-325-40 MG PO TABS
1.0000 | ORAL_TABLET | Freq: Once | ORAL | Status: AC
  Administered 2015-10-24: 1 via ORAL
  Filled 2015-10-24: qty 1

## 2015-10-24 NOTE — Progress Notes (Signed)
[facility] at [place] NAME: [name] [name]    MR#:  [id number]  DATE OF BIRTH:  June 30, 1960  SUBJECTIVE:  CHIEF COMPLAINT:   Chief Complaint  Patient presents with  . Abnormal Lab  Had EGD earlier which showed portal hypertensive gastropathy and some gastritis, hemoglobin 7.8, has good urine output so dialysis on hold Today during my examination patient is resting comfortably his dialysis catheter was removed for low-grade fevers patient reports that his baseline blood pressure is at 120/80.Reports he is getting headaches as blood pressure is elevated at systolic [id number] , requesting to increase his blood pressure medication  REVIEW OF SYSTEMS:  Review of Systems  Constitutional: Positive for malaise/fatigue. Negative for fever, weight loss and diaphoresis.  HENT: Negative for ear discharge, ear pain, hearing loss, nosebleeds, sore throat and tinnitus.   Eyes: Negative for blurred vision and pain.  Respiratory: Negative for cough, hemoptysis, shortness of breath and wheezing.   Cardiovascular: Negative for chest pain, palpitations, orthopnea and leg swelling.  Gastrointestinal: Negative for heartburn, nausea, vomiting, abdominal pain, diarrhea, constipation and blood in stool.  Genitourinary: Negative for dysuria, urgency and frequency.  Musculoskeletal: Negative for myalgias and back pain.  Skin: Negative for itching and rash.  Neurological: Positive for weakness. Negative for dizziness, tingling, tremors, focal weakness, seizures and headaches.  Psychiatric/Behavioral: Negative for depression. The patient is not nervous/anxious.    DRUG ALLERGIES:   Allergies  Allergen Reactions  . Fenofibrate Other (See Comments)   VITALS:  Blood pressure 145/76, pulse 95, temperature 98.7 F (37.1 C), temperature source Oral, resp. rate 18, height 6' (1.829 m), weight 85.911 kg (189 lb 6.4 oz), SpO2 99 %. PHYSICAL EXAMINATION:  Physical Exam    Constitutional: He is oriented to person, place, and time and well-developed, well-nourished, and in no distress.  HENT:  Head: Normocephalic and atraumatic.  Eyes: Conjunctivae and EOM are normal. Pupils are equal, round, and reactive to light.  Neck: Normal range of motion. Neck supple. No tracheal deviation present. No thyromegaly present.  Cardiovascular: Normal rate, regular rhythm and normal heart sounds.   Pulmonary/Chest: Effort normal and breath sounds normal. No respiratory distress. He has no wheezes. He exhibits no tenderness.  Abdominal: Soft. Bowel sounds are normal. He exhibits no distension. There is no tenderness.  Musculoskeletal: Normal range of motion.  Neurological: He is alert and oriented to person, place, and time. No cranial nerve deficit.  Skin: Skin is warm and dry. No rash noted.  Psychiatric: Mood and affect normal.   LABORATORY PANEL:   CBC  Recent Labs Lab 10/24/15 0359  WBC 12.0*  HGB 7.7*  HCT 23.0*  PLT 172   ------------------------------------------------------------------------------------------------------------------ Chemistries   Recent Labs Lab 10/19/15 0428  10/24/15 0359  NA 137  < > 141  141  K 5.9*  < > 3.0*  2.9*  CL 105  < > 106  106  CO2 12*  < > 24  24  GLUCOSE 96  < > 116*  116*  BUN 135*  < > 43*  43*  CREATININE 18.38*  < > 8.14*  8.35*  CALCIUM 7.5*  < > 6.4*  6.4*  AST 13*  --   --   ALT 51  --   --   ALKPHOS 102  --   --   BILITOT 0.7  --   --   < > = values in this interval not displayed. RADIOLOGY:  No results found. ASSESSMENT  AND PLAN:  This is a 55 year old male admitted for acute renal failure and pancreatitis.  1. Acute renal failure: 2/2 ATN-patient's baseline creatinine is 1 on 06/07/2015 -Creatinine 9.09-8.14 -Status post removal of the hemodialysis catheter as patient developed low-grade temperature - Most of the lab work so far has been negative - Urine microscopic exam showed muddy  brown casts suggestive of ATN - Dialysis per nephrology, has good urine output so dialysis on hold for now.  - May not need kidney biopsy either.  If creatinine continues to improve  2. Pancreatitis: amylase and lipase coming down, now resolved.  Could be due to hypertriglyceridemia. Avoid oral calcium supplements  3. GI bleed: EGD 10/23/2015  showed gastritis and mild portal hypertensive gastropathy   4. Anemia: possibility of slow GI bleed, hold Asa, Plavix. S/p 2 PRBC transfusion - hemoglobin did not respond appropriately - up to 7.8  5. Essential hypertension: Stop lisinopril - hydrochlorothiazide. Continue metoprolol and diltiazem. Clonidine dose increased to 0.2 mg by mouth twice a day  6. Hypercholesterolemia: Continue crestor 7. BPH: Continue Flomax 8.  Multiple kidney stones: Nonobstructive, can follow up with urology as an outpatient 9. Hyperkalemia: Resolved with dialysisNow patient is hypokalemic and we will provide potassium supplements and check BMP in a.m. , GI prophylaxis: As above DVT prophylaxis: SCD  as the patient has an active GI bleed   Discontinue telemetry   All the records are reviewed and case discussed with Care Management/Social Worker. Management plans discussed with the patient and nursing.  Discussed with Dr. Candiss Norse  CODE STATUS: FULL [place] THIS PATIENT: 35 minutes.   More than 50% of the time was spent in counseling/coordination of care: YES  POSSIBLE D/C IN AM, DEPENDING ON CLINICAL CONDITION. AND KIDNEY FUNCTION   Nicholes Mango M.D on 10/24/2015 at 3:56 PM  Between 7am to 6pm - Pager - [phone]  After 6pm go to [email or web address] - Proofreader  [facility]  Office  [phone]  CC: Primary care physician; Kirk Ruths., MD  Note: This dictation was prepared with Dragon dictation along with smaller phrase technology. Any transcriptional errors that result from this process are  unintentional.

## 2015-10-24 NOTE — Progress Notes (Signed)
The patient's hemoglobin has remained stable. No sign of bleeding.  His main concern right now is his renal function.  Nothing to do from a GI point of view except I will send off a hemachromatosis test because his iron is elevated.  This may be due to his liver disease or elevated as an acute phase reactant.  I will sign off.  Please call if any further GI concerns or questions.  We would like to thank you for the opportunity to participate in the care of [name] [name].

## 2015-10-24 NOTE — Evaluation (Signed)
Physical Therapy Evaluation Patient Details Name: [name] [name] MRN: [id number] DOB: 1961/03/26 Today's Date: 10/24/2015   History of Present Illness  presented to ER from outpatient office secondary to generalized malaise, elevated creatinine and anemia; admitted with acute renal failure requiring emergent, temporary dialysis (via R temp fem cath; removed 10/24/15).  EGD completed on 7/18 significant for portal hypertensive gastropathy and gastritis.  Clinical Impression  Upon evaluation, patient sleeping but easily arousable.  Demonstrates strength and ROM grossly WFL throughout all extremities, except R elbow ROM limited approx 15-90 degrees with localized swelling around elbow joint (feels he "slept on it wrong").  Able to complete bed mobility with mod indep; sit/stand, basic transfers and gait (220') without assist device, cga/close sup; decreased balance reactions evident, but no overt buckling or LOB.  5x sit/stand time (18 seconds) decreased compared to age-matched norms, indicative of decreased power/increased fall risk with mobility. L groin site clean, dry and intact throughout session. Would benefit from skilled PT to address above deficits and promote optimal return to PLOF; would benefit from outpatient PT follow up to address higher-level balance deficits and continued assessment of R UE if symptoms persist.     Follow Up Recommendations Outpatient PT    Equipment Recommendations       Recommendations for Other Services       Precautions / Restrictions Precautions Precautions: Fall Restrictions Weight Bearing Restrictions: No      Mobility  Bed Mobility Overal bed mobility: Modified Independent                Transfers Overall transfer level: Needs assistance   Transfers: Sit to/from Stand Sit to Stand: Supervision            Ambulation/Gait Ambulation/Gait assistance: Supervision;Min guard Ambulation Distance (Feet): 220 Feet Assistive  device: None       General Gait Details: broad BOS with limited/no heel strike/toe off.  Decreased ankle strategy, relying primarily on hip/LE step strategy for any balance recovery.  Decreased weight shift/stance time to R LE ("I have three stents in that leg, so that's probably why")  Stairs            Wheelchair Mobility    Modified Rankin (Stroke Patients Only)       Balance Overall balance assessment: Needs assistance Sitting-balance support: No upper extremity supported;Feet supported Sitting balance-Leahy Scale: Good     Standing balance support: No upper extremity supported Standing balance-Leahy Scale: Fair                               Pertinent Vitals/Pain Pain Assessment: 0-10 Pain Score: 3  Pain Location: R elbow Pain Descriptors / Indicators: Aching;Sore Pain Intervention(s): Limited activity within patient's tolerance;Monitored during session;Repositioned    Home Living                        Prior Function                 Hand Dominance        Extremity/Trunk Assessment   Upper Extremity Assessment:  (R elbow limited approx 15-90 degrees due to pain, swelling localized to elbow region; patient feels he "slept on it wrong")           Lower Extremity Assessment: Overall WFL for tasks assessed         Communication      Cognition Arousal/Alertness: Awake/alert Behavior During Therapy:  WFL for tasks assessed/performed Overall Cognitive Status: Within Functional Limits for tasks assessed                      General Comments      Exercises Other Exercises Other Exercises: Additional sit/stand without assist device for LE strength/power, close sup; minimal use of UEs to assist with movement transition.  5x sit/stand, 18 seconds; below age-matched norms      Assessment/Plan    PT Assessment Patient needs continued PT services  PT Diagnosis Difficulty walking;Generalized weakness   PT Problem  List Decreased strength;Decreased range of motion;Decreased activity tolerance;Decreased balance;Decreased mobility;Decreased coordination;Pain  PT Treatment Interventions DME instruction;Gait training;Stair training;Therapeutic activities;Functional mobility training;Therapeutic exercise;Balance training;Patient/family education   PT Goals (Current goals can be found in the Care Plan section) Acute Rehab PT Goals Patient Stated Goal: for my arm to feel better PT Goal Formulation: With patient Time For Goal Achievement: 11/07/15 Potential to Achieve Goals: Good    Frequency Min 2X/week   Barriers to discharge Decreased caregiver support      Co-evaluation               End of Session Equipment Utilized During Treatment: Gait belt Activity Tolerance: Patient tolerated treatment well Patient left: in bed;with call bell/phone within reach;with bed alarm set Nurse Communication: Mobility status         Time: [id number] PT Time Calculation (min) (ACUTE ONLY): 13 min   Charges:   PT Evaluation $PT Eval Low Complexity: 1 Procedure     PT G Codes:        Kao Conry H. Owens Shark, PT, DPT, NCS 10/24/2015, 4:59 PM [phone]

## 2015-10-24 NOTE — Progress Notes (Signed)
Notified Dr.pyreddy of pt calcium of 6.4. Acknowledged and no new orders.

## 2015-10-24 NOTE — Progress Notes (Signed)
Notified Dr. Margaretmary Eddy about patient's complaints of h/a. Patient states he has a h/a when his BP is high. BP recheck - pt bp is 132/71. MD aware. MD states that is a normal blood pressure for patient and no new orders for BP meds right now. Patient refuses tylenol due to liver function concerns. Pt does drink caffeine drinks at home. New order for 1 tab of fioricet once to see if relieves patient's h/a.

## 2015-10-24 NOTE — Progress Notes (Signed)
Subjective:  Patient reports some HA at night. He thinks this is related to high BP. He also report low grade fever. T max was 100.9. No reported SOB. S Cr improved to 8.1 without dialysis. Left shoulder discomfort and stiffness reported after sleeping.     Objective:  Vital signs in last 24 hours:  Temp:  [99.4 F (37.4 C)-100.9 F (38.3 C)] 100.9 F (38.3 C) (07/20 0447) Pulse Rate:  [86-103] 103 (07/20 0447) Resp:  [18-20] 20 (07/20 0447) BP: (140-143)/(67-74) 143/67 mmHg (07/20 0447) SpO2:  [95 %-99 %] 97 % (07/20 0447) Weight:  [85.911 kg (189 lb 6.4 oz)] 85.911 kg (189 lb 6.4 oz) (07/20 0500)  Weight change: 0.091 kg (3.2 oz) Filed Weights   10/22/15 0448 10/23/15 0500 10/24/15 0500  Weight: 87.045 kg (191 lb 14.4 oz) 85.821 kg (189 lb 3.2 oz) 85.911 kg (189 lb 6.4 oz)    Intake/Output:    Intake/Output Summary (Last 24 hours) at 10/24/15 1020 Last data filed at 10/24/15 0908  Gross per 24 hour  Intake 2621.5 ml  Output      0 ml  Net 2621.5 ml     Physical Exam: General: No acute distress  HEENT anicteric, moist mucous membranes  Neck supple  Pulm/lungs Normal breathing effort, clear to auscultation bilaterally  CVS/Heart Regular, no rub or gallop  Abdomen:  Soft, nontender  Extremities:  no peripheral edema  Neurologic: Alert, oriented  Skin: no acute rashes  Access: leftfemoral temporary catheter       Basic Metabolic Panel:   Recent Labs Lab 10/20/15 0544 10/21/15 0636 10/22/15 0607 10/23/15 0420 10/23/15 1505 10/24/15 0359  NA 140 138 141 143 143 141  141  K 4.2 3.8 3.5 3.0* 3.0* 3.0*  2.9*  CL 105 104 104 107 105 106  106  CO2 20* 19* 27 28 26 24  24   GLUCOSE 88 120* 107* 117* 137* 116*  116*  BUN 89* 88* 42* 47* 48* 43*  43*  CREATININE 13.10* 12.82* 8.39* 9.15* 9.09* 8.14*  8.35*  CALCIUM 7.1* 6.3* 7.5* 6.8* 6.8* 6.4*  6.4*  PHOS 8.9* 8.4*  --  5.3*  --  3.1     CBC:  Recent Labs Lab 10/19/15 0428 10/20/15 0544  10/21/15 0636 10/22/15 0607 10/23/15 0420 10/24/15 0359  WBC 6.6 5.0 6.5 6.1 8.7 12.0*  NEUTROABS 4.6  --   --   --   --   --   HGB 7.5* 8.3* 8.2* 7.8* 7.5* 7.7*  HCT 22.3* 24.0* 23.9* 22.3* 21.9* 23.0*  MCV 101.2* 95.1 93.9 94.0 95.9 94.1  PLT 181 151 154 143* 150 172      Microbiology:  No results found for this or any previous visit (from the past 720 hour(s)).  Coagulation Studies: No results for input(s): LABPROT, INR in the last 72 hours.  Urinalysis: No results for input(s): COLORURINE, LABSPEC, PHURINE, GLUCOSEU, HGBUR, BILIRUBINUR, KETONESUR, PROTEINUR, UROBILINOGEN, NITRITE, LEUKOCYTESUR in the last 72 hours.  Invalid input(s): APPERANCEUR    Imaging: No results found.   Medications:   . sodium chloride 75 mL/hr at 10/24/15 0907   . amLODipine  5 mg Oral Daily  . cloNIDine  0.1 mg Oral BID  . diltiazem  120 mg Oral Daily  . docusate sodium  100 mg Oral BID  . folic acid  1 mg Oral Daily  . heparin  5,000 Units Intravenous Once  . metoprolol  200 mg Oral Daily  . multivitamin with minerals  1 tablet Oral Daily  . omega-3 acid ethyl esters  4 g Oral Daily  . pantoprazole (PROTONIX) IV  40 mg Intravenous Q12H  . rosuvastatin  40 mg Oral QPM  . sodium bicarbonate  1,300 mg Oral BID  . sodium chloride flush  3 mL Intravenous Q12H  . tamsulosin  0.4 mg Oral Daily  . thiamine  100 mg Oral Daily  . vitamin B-12  2,000 mcg Oral Daily   acetaminophen **OR** acetaminophen, ondansetron **OR** ondansetron (ZOFRAN) IV  Assessment/ Plan:  55 y.o. male originally from [place], history of alcohol abuse, hepatitis, peripheral vascular disease, hypertension, hyperlipidemia, BPH, admitted to [facility] on July 15 for acute renal failure   1. Acute Renal Failure: baseline creatinine of 1 on 06/07/15 2. Hyperkalemia, now hypokalemia 3. Metabolic acidosis 4. Anemia with renal failure: 2 units PRBC on 7/15  5. Hypocalcemia: calcium of 6.4 6.  Acute  pancreatitis 7.  Hyperlipidemia, severe 8. Shoulder discomfort and stiffness  Serological studies have been negative so far.  Urine protein/creatine ratio is 0.6. Urine microscopic exam performed by Dr. Juleen China showed muddy brown cast suggesting ATN. Urine output is improving. Will continue to monitor renal function on a daily basis.   - hold further HD. Remove HD catheter in light of low grade fever. - Continue to monitor urine output and electrolytes closely. - avoid oral calcium supplements due to acute pancreatitis - Replace potassium as needed.    LOS: 5 Shawanda Sievert 7/20/201710:20 AM

## 2015-10-25 ENCOUNTER — Inpatient Hospital Stay: Payer: 59

## 2015-10-25 LAB — BASIC METABOLIC PANEL
Anion gap: 10 (ref 5–15)
BUN: 45 mg/dL — AB (ref 6–20)
CALCIUM: 5.8 mg/dL — AB (ref 8.9–10.3)
CO2: 23 mmol/L (ref 22–32)
Chloride: 105 mmol/L (ref 101–111)
Creatinine, Ser: 7.31 mg/dL — ABNORMAL HIGH (ref 0.61–1.24)
GFR calc Af Amer: 9 mL/min — ABNORMAL LOW (ref >60)
GFR, EST NON AFRICAN AMERICAN: 7 mL/min — AB (ref >60)
Glucose, Bld: 128 mg/dL — ABNORMAL HIGH (ref 65–99)
POTASSIUM: 2.9 mmol/L — AB (ref 3.5–5.1)
SODIUM: 138 mmol/L (ref 135–145)

## 2015-10-25 LAB — URINALYSIS COMPLETE WITH MICROSCOPIC (ARMC ONLY)
BILIRUBIN URINE: NEGATIVE
Bacteria, UA: NONE SEEN
GLUCOSE, UA: 50 mg/dL — AB
KETONES UR: NEGATIVE mg/dL
Leukocytes, UA: NEGATIVE
Nitrite: NEGATIVE
PROTEIN: 30 mg/dL — AB
SPECIFIC GRAVITY, URINE: 1.008 (ref 1.005–1.030)
pH: 7 (ref 5.0–8.0)

## 2015-10-25 LAB — GIARDIA/CRYPTOSPORIDIUM EIA
Cryptosporidium EIA: NEGATIVE
GIARDIA AG STL: NEGATIVE

## 2015-10-25 MED ORDER — PIPERACILLIN-TAZOBACTAM 3.375 G IVPB 30 MIN
3.3750 g | Freq: Once | INTRAVENOUS | Status: AC
Start: 2015-10-25 — End: 2015-10-25
  Administered 2015-10-25: 3.375 g via INTRAVENOUS
  Filled 2015-10-25: qty 50

## 2015-10-25 MED ORDER — CALCIUM CARBONATE ANTACID 500 MG PO CHEW
500.0000 mg | CHEWABLE_TABLET | Freq: Two times a day (BID) | ORAL | Status: DC
  Administered 2015-10-26 – 2015-10-30 (×6): 500 mg via ORAL
  Filled 2015-10-25: qty 3
  Filled 2015-10-25: qty 1
  Filled 2015-10-25: qty 2
  Filled 2015-10-25: qty 1
  Filled 2015-10-25: qty 3
  Filled 2015-10-25: qty 2
  Filled 2015-10-25: qty 1
  Filled 2015-10-25 (×2): qty 3

## 2015-10-25 MED ORDER — PIPERACILLIN-TAZOBACTAM 3.375 G IVPB
3.3750 g | Freq: Two times a day (BID) | INTRAVENOUS | Status: DC
  Administered 2015-10-25 – 2015-10-27 (×5): 3.375 g via INTRAVENOUS
  Filled 2015-10-25 (×7): qty 50

## 2015-10-25 MED ORDER — LEVOFLOXACIN IN D5W 750 MG/150ML IV SOLN
750.0000 mg | Freq: Once | INTRAVENOUS | Status: AC
  Administered 2015-10-25: 750 mg via INTRAVENOUS
  Filled 2015-10-25: qty 150

## 2015-10-25 MED ORDER — LEVOFLOXACIN IN D5W 500 MG/100ML IV SOLN
500.0000 mg | INTRAVENOUS | Status: DC
Start: 2015-10-27 — End: 2015-10-26

## 2015-10-25 MED ORDER — POTASSIUM CHLORIDE 20 MEQ PO PACK
40.0000 meq | PACK | Freq: Once | ORAL | Status: AC
  Administered 2015-10-25: 40 meq via ORAL
  Filled 2015-10-25: qty 2

## 2015-10-25 MED ORDER — PIPERACILLIN-TAZOBACTAM 3.375 G IVPB 30 MIN: 3.3750 g | Freq: Three times a day (TID) | INTRAVENOUS | Status: DC

## 2015-10-25 NOTE — Progress Notes (Signed)
Notified Dr.Pyreddy of pt calcium level at 5.8. Acknowledged and no new orders.

## 2015-10-25 NOTE — Progress Notes (Addendum)
[facility] NAME: [name] [name]    MR#:  [id number]  DATE OF BIRTH:  1960-08-20  SUBJECTIVE:  CHIEF COMPLAINT:   Chief Complaint  Patient presents with  . Abnormal Lab  Had EGD earlier which showed portal hypertensive gastropathy and some gastritis, hemoglobin 7.8, has good urine output so dialysis on hold Today -Patient had spiked fever ,his dialysis catheter and central line was removed 10/24/2015 for low-grade fevers. Denies any cough feeling okay REVIEW OF SYSTEMS:  Review of Systems  Constitutional: Positive for malaise/fatigue. Negative for fever, weight loss and diaphoresis.  HENT: Negative for ear discharge, ear pain, hearing loss, nosebleeds, sore throat and tinnitus.   Eyes: Negative for blurred vision and pain.  Respiratory: Negative for cough, hemoptysis, shortness of breath and wheezing.   Cardiovascular: Negative for chest pain, palpitations, orthopnea and leg swelling.  Gastrointestinal: Negative for heartburn, nausea, vomiting, abdominal pain, diarrhea, constipation and blood in stool.  Genitourinary: Negative for dysuria, urgency and frequency.  Musculoskeletal: Negative for myalgias and back pain.  Skin: Negative for itching and rash.  Neurological: Positive for weakness. Negative for dizziness, tingling, tremors, focal weakness, seizures and headaches.  Psychiatric/Behavioral: Negative for depression. The patient is not nervous/anxious.    DRUG ALLERGIES:   Allergies  Allergen Reactions  . Fenofibrate Other (See Comments)   VITALS:  Blood pressure 130/76, pulse 97, temperature 99.1 F (37.3 C), temperature source Oral, resp. rate 17, height 6' (1.829 m), weight 86.365 kg (190 lb 6.4 oz), SpO2 97 %. PHYSICAL EXAMINATION:  Physical Exam  Constitutional: He is oriented to person, place, and time and well-developed, well-nourished, and in no distress.  HENT:  Head: Normocephalic and atraumatic.  Eyes:  Conjunctivae and EOM are normal. Pupils are equal, round, and reactive to light.  Neck: Normal range of motion. Neck supple. No tracheal deviation present. No thyromegaly present.  Cardiovascular: Normal rate, regular rhythm and normal heart sounds.   Pulmonary/Chest: Effort normal and breath sounds normal. No respiratory distress. He has no wheezes. He exhibits no tenderness.  Abdominal: Soft. Bowel sounds are normal. He exhibits no distension. There is no tenderness.  Musculoskeletal: Normal range of motion.  Neurological: He is alert and oriented to person, place, and time. No cranial nerve deficit.  Skin: Skin is warm and dry. No rash noted.  Psychiatric: Mood and affect normal.   LABORATORY PANEL:   CBC  Recent Labs Lab 10/24/15 0359  WBC 12.0*  HGB 7.7*  HCT 23.0*  PLT 172   ------------------------------------------------------------------------------------------------------------------ Chemistries   Recent Labs Lab 10/19/15 0428  10/25/15 0512  NA 137  < > 138  K 5.9*  < > 2.9*  CL 105  < > 105  CO2 12*  < > 23  GLUCOSE 96  < > 128*  BUN 135*  < > 45*  CREATININE 18.38*  < > 7.31*  CALCIUM 7.5*  < > 5.8*  AST 13*  --   --   ALT 51  --   --   ALKPHOS 102  --   --   BILITOT 0.7  --   --   < > = values in this interval not displayed. RADIOLOGY:  Dg Chest 2 View  10/25/2015  CLINICAL DATA:  Shortness of Breath EXAM: CHEST  2 VIEW COMPARISON:  October 14, 2006 FINDINGS: Lungs are hyperexpanded. There is minimal scarring in the left base. There is no edema or consolidation. Heart size and pulmonary vascularity  are normal. No adenopathy. There is atherosclerotic calcification in the aortic arch. There is degenerative change in the thoracic spine. IMPRESSION: Lungs hyperexpanded. Minimal scarring left base. No edema or consolidation. Aortic atherosclerosis. Electronically Signed   By: Lowella Grip III M.D.   On: 10/25/2015 10:26   ASSESSMENT AND PLAN:  This is a  55 year old male admitted for acute renal failure and pancreatitis.  #Sepsis Pancultures were obtained today Chest x-rays negative  dialysis catheter from the groin and central line were removed on July 20 Started patient on empiric IV antibiotics  #. Acute renal failure: 2/2 ATN-patient's baseline creatinine is 1 on 06/07/2015 -Creatinine 9.09-8.14-7.31 -Status post removal of the hemodialysis catheter as patient developed low-grade temperature - Most of the lab work so far has been negative - Urine microscopic exam showed muddy brown casts suggestive of ATN - Dialysis per nephrology, has good urine output so dialysis on hold for now.   #. Pancreatitis: amylase and lipase coming down, now resolved.  Could be due to hypertriglyceridemia. Avoid oral calcium supplements   #hypokalemia replete potassium and recheck in a.m. including magnesium    #. GI bleed: EGD 10/23/2015  showed gastritis and mild portal hypertensive gastropathy   # Anemia: possibility of slow GI bleed, hold Asa, Plavix. S/p 2 PRBC transfusion - hemoglobin 7.7 today  #. Essential hypertension: Stop lisinopril - hydrochlorothiazide. Continue metoprolol and diltiazem. Clonidine dose increased to 0.2 mg by mouth twice a day  #. Hypercholesterolemia: Continue crestor #. BPH: Continue Flomax #.  Multiple kidney stones: Nonobstructive, can follow up with urology as an outpatient  Physical therapy has recommended outpatient PT   GI prophylaxis: As above DVT prophylaxis: SCD  as the patient has an active GI bleed   Discontinue telemetry   All the records are reviewed and case discussed with Care Management/Social Worker. Management plans discussed with the patient and nursing.  Discussed with Dr. Candiss Norse  CODE STATUS: FULL [place] THIS PATIENT: 35 minutes.   More than 50% of the time was spent in counseling/coordination of care: YES  POSSIBLE D/C IN AM, DEPENDING ON CLINICAL CONDITION. AND  KIDNEY FUNCTION   Nicholes Mango M.D on 10/25/2015 at 2:51 PM  Between 7am to 6pm - Pager - [phone]  After 6pm go to [email or web address] - Proofreader  [facility]  Office  [phone]  CC: Primary care physician; Kirk Ruths., MD  Note: This dictation was prepared with Dragon dictation along with smaller phrase technology. Any transcriptional errors that result from this process are unintentional.

## 2015-10-25 NOTE — Progress Notes (Signed)
Subjective:  Patient has had a fever overnight. T max 102.2 Temporary dialysis catheter was removed. Patient S Cr is down to 7.3 from 8.5 yesterday. No SOB. No leg edema. Potassium is low at 2.9. Calcium also low at 5.8. He states that he is eager to go home.   Objective:  Vital signs in last 24 hours:  Temp:  [98.7 F (37.1 C)-102.2 F (39 C)] 100.3 F (37.9 C) (07/21 0605) Pulse Rate:  [95-99] 96 (07/21 0458) Resp:  [18-19] 19 (07/21 0458) BP: (114-145)/(62-82) 134/82 mmHg (07/21 0458) SpO2:  [93 %-99 %] 94 % (07/21 0458) Weight:  [86.365 kg (190 lb 6.4 oz)] 86.365 kg (190 lb 6.4 oz) (07/21 0500)  Weight change: 0.454 kg (1 lb) Filed Weights   10/23/15 0500 10/24/15 0500 10/25/15 0500  Weight: 85.821 kg (189 lb 3.2 oz) 85.911 kg (189 lb 6.4 oz) 86.365 kg (190 lb 6.4 oz)    Intake/Output:    Intake/Output Summary (Last 24 hours) at 10/25/15 0957 Last data filed at 10/25/15 0910  Gross per 24 hour  Intake 2787.5 ml  Output      0 ml  Net 2787.5 ml     Physical Exam: General: No acute distress  HEENT anicteric, moist mucous membranes  Neck supple  Pulm/lungs Normal breathing effort, clear to auscultation bilaterally  CVS/Heart Regular, no rub or gallop  Abdomen:  Soft, nontender  Extremities:  no peripheral edema  Neurologic: Alert, oriented  Skin: no acute rashes          Basic Metabolic Panel:   Recent Labs Lab 10/20/15 0544 10/21/15 0636 10/22/15 0607 10/23/15 0420 10/23/15 1505 10/24/15 0359 10/25/15 0512  NA 140 138 141 143 143 141  141 138  K 4.2 3.8 3.5 3.0* 3.0* 3.0*  2.9* 2.9*  CL 105 104 104 107 105 106  106 105  CO2 20* 19* 27 28 26 24  24 23   GLUCOSE 88 120* 107* 117* 137* 116*  116* 128*  BUN 89* 88* 42* 47* 48* 43*  43* 45*  CREATININE 13.10* 12.82* 8.39* 9.15* 9.09* 8.14*  8.35* 7.31*  CALCIUM 7.1* 6.3* 7.5* 6.8* 6.8* 6.4*  6.4* 5.8*  PHOS 8.9* 8.4*  --  5.3*  --  3.1  --      CBC:  Recent Labs Lab 10/19/15 0428  10/20/15 0544 10/21/15 0636 10/22/15 0607 10/23/15 0420 10/24/15 0359  WBC 6.6 5.0 6.5 6.1 8.7 12.0*  NEUTROABS 4.6  --   --   --   --   --   HGB 7.5* 8.3* 8.2* 7.8* 7.5* 7.7*  HCT 22.3* 24.0* 23.9* 22.3* 21.9* 23.0*  MCV 101.2* 95.1 93.9 94.0 95.9 94.1  PLT 181 151 154 143* 150 172      Microbiology:  Recent Results (from the past 720 hour(s))  Giardia/Cryptosporidium EIA     Status: None (Preliminary result)   Collection Time: 10/22/15  1:40 AM  Result Value Ref Range Status   Giardia Ag, Stl Negative Negative Final    Comment: (NOTE) Performed At: [facility] [place], [place] [id number] Lindon Romp MD [id number]    Cryptosporidium EIA PENDING  Incomplete    Coagulation Studies: No results for input(s): LABPROT, INR in the last 72 hours.  Urinalysis: No results for input(s): COLORURINE, LABSPEC, PHURINE, GLUCOSEU, HGBUR, BILIRUBINUR, KETONESUR, PROTEINUR, UROBILINOGEN, NITRITE, LEUKOCYTESUR in the last 72 hours.  Invalid input(s): APPERANCEUR    Imaging: No results found.   Medications:   .  sodium chloride 75 mL/hr at 10/24/15 2047   . calcium carbonate  500 mg of elemental calcium Oral BID WC  . cloNIDine  0.2 mg Oral BID  . diltiazem  120 mg Oral Daily  . docusate sodium  100 mg Oral BID  . folic acid  1 mg Oral Daily  . heparin  5,000 Units Intravenous Once  . [START ON 10/27/2015] levofloxacin (LEVAQUIN) IV  500 mg Intravenous Q48H  . levofloxacin (LEVAQUIN) IV  750 mg Intravenous Once  . metoprolol  200 mg Oral Daily  . multivitamin with minerals  1 tablet Oral Daily  . omega-3 acid ethyl esters  4 g Oral Daily  . pantoprazole (PROTONIX) IV  40 mg Intravenous Q12H  . piperacillin-tazobactam  3.375 g Intravenous Once  . piperacillin-tazobactam (ZOSYN)  IV  3.375 g Intravenous Q12H  . rosuvastatin  40 mg Oral QPM  . sodium bicarbonate  1,300 mg Oral BID  . sodium chloride flush  3 mL Intravenous Q12H  . tamsulosin   0.4 mg Oral Daily  . thiamine  100 mg Oral Daily  . vitamin B-12  2,000 mcg Oral Daily   acetaminophen **OR** acetaminophen, ondansetron **OR** ondansetron (ZOFRAN) IV  Assessment/ Plan:  55 y.o. male originally from [place], history of alcohol abuse, hepatitis, peripheral vascular disease, hypertension, hyperlipidemia, BPH, admitted to [facility] on July 15 for acute renal failure   1. Acute Renal Failure: baseline creatinine of 1 on 06/07/15 2. Hyperkalemia, now hypokalemia 3. Metabolic acidosis 4. Anemia with renal failure: 2 units PRBC on 7/15  5. Hypocalcemia: calcium of 5.8 6.  Acute pancreatitis 7.  Hyperlipidemia, severe 8. Shoulder discomfort and stiffness 9. Fever - T max 102.2  Serological studies have been negative so far.  Urine protein/creatine ratio is 0.6. Urine microscopic exam performed by Dr. Juleen China showed muddy brown cast suggesting ATN. Urine output is improving. S Cr improved to 7.3 today without dialysis. Will continue to monitor renal function on a daily basis.   - Continue to monitor urine output and electrolytes closely. - Replace potassium as needed.  - If temperature is spiked again will order sepsis work-up    LOS: 6 [name] [name] 7/21/20179:57 AM

## 2015-10-25 NOTE — Progress Notes (Signed)
ANTIBIOTIC CONSULT NOTE - INITIAL  Pharmacy Consult for Levofloxacin Indication: sepsis  Allergies  Allergen Reactions  . Fenofibrate Other (See Comments)    Patient Measurements: Height: 6' (182.9 cm) Weight: 190 lb 6.4 oz (86.365 kg) IBW/kg (Calculated) : 77.6 Adjusted Body Weight:   Vital Signs: Temp: 100.3 F (37.9 C) (07/21 0605) Temp Source: Oral (07/21 0458) BP: 134/82 mmHg (07/21 0458) Pulse Rate: 96 (07/21 0458) Intake/Output from previous day: 07/20 0701 - 07/21 0700 In: 2677.5 [P.O.:240; I.V.:2437.5] Out: 0  Intake/Output from this shift: Total I/O In: 1062.5 [P.O.:750; I.V.:312.5] Out: -   Labs:  Recent Labs  10/23/15 0420 10/23/15 1200 10/23/15 1505 10/24/15 0359 10/25/15 0512  WBC 8.7  --   --  12.0*  --   HGB 7.5*  --   --  7.7*  --   PLT 150  --   --  172  --   LABCREA  --  43  --   --   --   CREATININE 9.15*  --  9.09* 8.14*  8.35* 7.31*   Estimated Creatinine Clearance: 12.4 mL/min (by C-G formula based on Cr of 7.31). No results for input(s): VANCOTROUGH, VANCOPEAK, VANCORANDOM, GENTTROUGH, GENTPEAK, GENTRANDOM, TOBRATROUGH, TOBRAPEAK, TOBRARND, AMIKACINPEAK, AMIKACINTROU, AMIKACIN in the last 72 hours.   Microbiology: Recent Results (from the past 720 hour(s))  Giardia/Cryptosporidium EIA     Status: None (Preliminary result)   Collection Time: 10/22/15  1:40 AM  Result Value Ref Range Status   Giardia Ag, Stl Negative Negative Final    Comment: (NOTE) Performed At: [facility] [place], [place] [id number] Lindon Romp MD [id number]    Cryptosporidium EIA PENDING  Incomplete    Medical History: Past Medical History  Diagnosis Date  . Hypertriglyceridemia   . Chest pain     Unspecified  . Palpitations   . Hypertension   . Peripheral vascular disease ([place])   . Chronic kidney disease   . Kidney stone on left side     Medications:  Prescriptions prior to admission  Medication Sig Dispense  Refill Last Dose  . aspirin EC 81 MG tablet Take 81 mg by mouth daily.   10/18/2015 at Unknown time  . clopidogrel (PLAVIX) 75 MG tablet Take 75 mg by mouth daily.   10/18/2015 at Unknown time  . diltiazem (CARDIZEM CD) 120 MG 24 hr capsule Take 120 mg by mouth daily.    10/18/2015 at Unknown time  . lisinopril-hydrochlorothiazide (PRINZIDE,ZESTORETIC) 20-12.5 MG per tablet Take 1 tablet by mouth daily.   10/18/2015 at Unknown time  . metoprolol (TOPROL-XL) 200 MG 24 hr tablet Take 200 mg by mouth daily.    10/18/2015 at Unknown time  . omega-3 acid ethyl esters (LOVAZA) 1 G capsule Take 4 g by mouth daily.    10/18/2015 at Unknown time  . pantoprazole (PROTONIX) 40 MG tablet Take 40 mg by mouth daily.   10/18/2015 at Unknown time  . rosuvastatin (CRESTOR) 40 MG tablet Take 40 mg by mouth every evening.     . vitamin B-12 (CYANOCOBALAMIN) 1000 MCG tablet Take 2,000 mcg by mouth daily.   10/18/2015 at Unknown time  . HYDROcodone-acetaminophen (NORCO/VICODIN) 5-325 MG per tablet Take 1-2 tablets by mouth every 6 (six) hours as needed for moderate pain. (Patient not taking: Reported on 10/12/2014) 10 tablet 0 Not Taking at Unknown time  . tamsulosin (FLOMAX) 0.4 MG CAPS capsule Take 1 capsule (0.4 mg total) by mouth daily. (Patient not taking: Reported on  10/19/2015) 30 capsule 0 Not Taking at Unknown time   Scheduled:  . calcium carbonate  500 mg of elemental calcium Oral BID WC  . cloNIDine  0.2 mg Oral BID  . diltiazem  120 mg Oral Daily  . docusate sodium  100 mg Oral BID  . folic acid  1 mg Oral Daily  . heparin  5,000 Units Intravenous Once  . [START ON 10/27/2015] levofloxacin (LEVAQUIN) IV  500 mg Intravenous Q48H  . levofloxacin (LEVAQUIN) IV  750 mg Intravenous Once  . metoprolol  200 mg Oral Daily  . multivitamin with minerals  1 tablet Oral Daily  . omega-3 acid ethyl esters  4 g Oral Daily  . pantoprazole (PROTONIX) IV  40 mg Intravenous Q12H  . piperacillin-tazobactam  3.375 g Intravenous  Once  . piperacillin-tazobactam (ZOSYN)  IV  3.375 g Intravenous Q12H  . rosuvastatin  40 mg Oral QPM  . sodium bicarbonate  1,300 mg Oral BID  . sodium chloride flush  3 mL Intravenous Q12H  . tamsulosin  0.4 mg Oral Daily  . thiamine  100 mg Oral Daily  . vitamin B-12  2,000 mcg Oral Daily   Assessment: Pharmacy consulted to dose and monitor levofloxacin therapy in this 55 year old male. Patient is currently receiving Zosyn in addition to levofloxacin for sepsis.  Goal of Therapy:  Resolution of infectious process  Plan:  Will give levofloxacin 750 mg IV x 1 and 500 mg IV q48 hours thereafter. Pharmacy to follow per consult.    Latoi Giraldo D 10/25/2015,9:41 AM

## 2015-10-25 NOTE — Progress Notes (Signed)
Physical Therapy Treatment Patient Details Name: [name] [name] MRN: [id number] DOB: 12-07-1960 Today's Date: 10/25/2015    History of Present Illness presented to ER from outpatient office secondary to generalized malaise, elevated creatinine and anemia; admitted with acute renal failure requiring emergent, temporary dialysis (via R temp fem cath; removed 10/24/15).    PT Comments    Continued assessment of R UE--remains generally edematous over elbow region; point tender over lateral epicondyle; decreased active ROM in flex/ext/pronation/supination and wrist extension (limited by pain); mild paresthesia in R hand.  Question recent IV infiltrate vs. tendonitis vs. mild nerve impingement.  Educated patient in positioning (to promote edema management) and use of ice to decrease inflammation.  RN informed/aware--to obtain order for ice pack and issue as appropriate.  Will continue assessment as needed--may benefit from outpatient OT consult upon discharge if symptoms persist.   Follow Up Recommendations  Outpatient PT (outpatient OT if R UE symptoms persist)     Equipment Recommendations       Recommendations for Other Services       Precautions / Restrictions Precautions Precautions: Fall Restrictions Weight Bearing Restrictions: No    Mobility  Bed Mobility Overal bed mobility: Modified Independent                Transfers Overall transfer level: Needs assistance   Transfers: Sit to/from Stand Sit to Stand: Supervision            Ambulation/Gait Ambulation/Gait assistance: Supervision;Min guard Ambulation Distance (Feet): 220 Feet Assistive device: None       General Gait Details: broad BOS with limited/no heel strike/toe off.  Decreased ankle strategy, relying primarily on hip/LE step strategy for any balance recovery.  Decreased weight shift/stance time to R LE.  Increased force of contact to bilat LEs at heel strike/loading   Stairs Stairs:  Yes Stairs assistance: Min assist Stair Management: One rail Left Number of Stairs: 3 General stair comments: step to descending with noted weakness in eccentric muscle contraction; reciprocal ascending with fair/good quad control  Wheelchair Mobility    Modified Rankin (Stroke Patients Only)       Balance Overall balance assessment: Needs assistance Sitting-balance support: No upper extremity supported;Feet supported Sitting balance-Leahy Scale: Good     Standing balance support: No upper extremity supported Standing balance-Leahy Scale: Fair                      Cognition Arousal/Alertness: Awake/alert Behavior During Therapy: WFL for tasks assessed/performed Overall Cognitive Status: Within Functional Limits for tasks assessed                      Exercises Other Exercises Other Exercises: Additional gait x200' without assist device--encouraged trial of SPC for optimal safety; patient politely refuses at this time. Other Exercises: Continued assessment of R UE--remains generally edematous over elbow region; point tender over lateral epicondyle; decreased active ROM in flex/ext/pronation/supination and wrist extension (limited by pain); mild paresthesia in R hand.  Question recent IV infiltrate vs. tendonitis vs. mild nerve impingement.  Educated patient in positioning (to promote edema management) and use of ice to decrease inflammation.  RN informed/aware--to obtain order for ice pack and issue as appropriate.    General Comments        Pertinent Vitals/Pain Pain Score: 3  Pain Location: R elbow Pain Descriptors / Indicators: Sore Pain Intervention(s): Limited activity within patient's tolerance;Monitored during session;Repositioned (requested ice pack for R elbow per RN)  Home Living                      Prior Function            PT Goals (current goals can now be found in the care plan section) Acute Rehab PT Goals Patient Stated  Goal: for my arm to feel better PT Goal Formulation: With patient Time For Goal Achievement: 11/07/15 Potential to Achieve Goals: Good Progress towards PT goals: Progressing toward goals    Frequency  Min 2X/week    PT Plan Current plan remains appropriate    Co-evaluation             End of Session Equipment Utilized During Treatment: Gait belt Activity Tolerance: Patient tolerated treatment well Patient left: with call bell/phone within reach;with chair alarm set;in chair     Time: [id number] PT Time Calculation (min) (ACUTE ONLY): 24 min  Charges:  $Gait Training: 8-22 mins $Therapeutic Activity: 8-22 mins                    G Codes:      Branndon Tuite H. Owens Shark, PT, DPT, NCS 10/25/2015, 11:18 AM [phone]

## 2015-10-26 ENCOUNTER — Inpatient Hospital Stay: Payer: 59

## 2015-10-26 LAB — HEMOGLOBIN AND HEMATOCRIT, BLOOD
HCT: 24.5 % — ABNORMAL LOW (ref 40.0–52.0)
HEMOGLOBIN: 8.5 g/dL — AB (ref 13.0–18.0)

## 2015-10-26 LAB — CBC WITH DIFFERENTIAL/PLATELET
BASOS PCT: 0 %
Basophils Absolute: 0 10*3/uL (ref 0–0.1)
Eosinophils Absolute: 0 10*3/uL (ref 0–0.7)
Eosinophils Relative: 0 %
HEMATOCRIT: 20.1 % — AB (ref 40.0–52.0)
HEMOGLOBIN: 6.7 g/dL — AB (ref 13.0–18.0)
LYMPHS ABS: 1.3 10*3/uL (ref 1.0–3.6)
Lymphocytes Relative: 11 %
MCH: 32 pg (ref 26.0–34.0)
MCHC: 33.5 g/dL (ref 32.0–36.0)
MCV: 95.6 fL (ref 80.0–100.0)
MONO ABS: 1.3 10*3/uL — AB (ref 0.2–1.0)
MONOS PCT: 11 %
NEUTROS ABS: 9 10*3/uL — AB (ref 1.4–6.5)
Neutrophils Relative %: 78 %
Platelets: 138 10*3/uL — ABNORMAL LOW (ref 150–440)
RBC: 2.11 MIL/uL — ABNORMAL LOW (ref 4.40–5.90)
RDW: 15.3 % — AB (ref 11.5–14.5)
WBC: 11.7 10*3/uL — ABNORMAL HIGH (ref 3.8–10.6)

## 2015-10-26 LAB — BASIC METABOLIC PANEL
ANION GAP: 12 (ref 5–15)
BUN: 51 mg/dL — ABNORMAL HIGH (ref 6–20)
CALCIUM: 5.4 mg/dL — AB (ref 8.9–10.3)
CO2: 22 mmol/L (ref 22–32)
Chloride: 104 mmol/L (ref 101–111)
Creatinine, Ser: 6.72 mg/dL — ABNORMAL HIGH (ref 0.61–1.24)
GFR, EST AFRICAN AMERICAN: 10 mL/min — AB (ref >60)
GFR, EST NON AFRICAN AMERICAN: 8 mL/min — AB (ref >60)
GLUCOSE: 97 mg/dL (ref 65–99)
POTASSIUM: 2.9 mmol/L — AB (ref 3.5–5.1)
Sodium: 138 mmol/L (ref 135–145)

## 2015-10-26 LAB — PREPARE RBC (CROSSMATCH)

## 2015-10-26 LAB — URIC ACID: URIC ACID, SERUM: 4.5 mg/dL (ref 4.4–7.6)

## 2015-10-26 LAB — MAGNESIUM
MAGNESIUM: 1.5 mg/dL — AB (ref 1.7–2.4)
MAGNESIUM: 2 mg/dL (ref 1.7–2.4)
Magnesium: 0.9 mg/dL — CL (ref 1.7–2.4)
Magnesium: 1.9 mg/dL (ref 1.7–2.4)

## 2015-10-26 LAB — POTASSIUM
Potassium: 2.7 mmol/L — CL (ref 3.5–5.1)
Potassium: 2.8 mmol/L — ABNORMAL LOW (ref 3.5–5.1)

## 2015-10-26 LAB — OCCULT BLOOD X 1 CARD TO LAB, STOOL: Fecal Occult Bld: NEGATIVE

## 2015-10-26 MED ORDER — POTASSIUM CHLORIDE 20 MEQ PO PACK
40.0000 meq | PACK | Freq: Once | ORAL | Status: AC
  Administered 2015-10-26: 40 meq via ORAL

## 2015-10-26 MED ORDER — POTASSIUM CHLORIDE 20 MEQ PO PACK
40.0000 meq | PACK | Freq: Once | ORAL | Status: AC
  Administered 2015-10-26: 40 meq via ORAL
  Filled 2015-10-26 (×2): qty 2

## 2015-10-26 MED ORDER — MORPHINE SULFATE (PF) 2 MG/ML IV SOLN
2.0000 mg | Freq: Four times a day (QID) | INTRAVENOUS | Status: DC | PRN
Start: 2015-10-26 — End: 2015-10-30

## 2015-10-26 MED ORDER — MAGNESIUM SULFATE 2 GM/50ML IV SOLN
2.0000 g | Freq: Once | INTRAVENOUS | Status: AC
  Administered 2015-10-26: 2 g via INTRAVENOUS
  Filled 2015-10-26: qty 50

## 2015-10-26 MED ORDER — SODIUM CHLORIDE 0.9 % IV SOLN
Freq: Once | INTRAVENOUS | Status: AC
  Administered 2015-10-26: 11:00:00 via INTRAVENOUS

## 2015-10-26 MED ORDER — CLONIDINE HCL 0.1 MG PO TABS
0.1000 mg | ORAL_TABLET | Freq: Two times a day (BID) | ORAL | Status: DC
  Administered 2015-10-26 – 2015-10-29 (×5): 0.1 mg via ORAL
  Filled 2015-10-26 (×6): qty 1

## 2015-10-26 MED ORDER — KCL IN DEXTROSE-NACL 20-5-0.9 MEQ/L-%-% IV SOLN
INTRAVENOUS | Status: AC
  Administered 2015-10-26: 14:00:00 via INTRAVENOUS
  Filled 2015-10-26: qty 1000

## 2015-10-26 MED ORDER — OXYCODONE HCL 5 MG PO TABS
5.0000 mg | ORAL_TABLET | Freq: Four times a day (QID) | ORAL | Status: DC | PRN
  Administered 2015-10-26 – 2015-10-29 (×7): 5 mg via ORAL
  Filled 2015-10-26 (×7): qty 1

## 2015-10-26 MED ORDER — VANCOMYCIN HCL IN DEXTROSE 1-5 GM/200ML-% IV SOLN
1000.0000 mg | INTRAVENOUS | Status: DC
  Administered 2015-10-26: 1000 mg via INTRAVENOUS
  Filled 2015-10-26 (×2): qty 200

## 2015-10-26 NOTE — Progress Notes (Signed)
Dr. Margaretmary Eddy notified of 6.7 Hgb and 20.1 Hct. Order received to give 1 unit of PRBCs. Dr. Margaretmary Eddy also asked nurse to let Nephrology know and to make sure Nephrology is fine with blood transfusion. [name] [name]

## 2015-10-26 NOTE — Consult Note (Signed)
ORTHOPAEDIC CONSULTATION  REQUESTING PHYSICIAN: Nicholes Mango, MD  Chief Complaint: Right elbow pain  HPI: [name] [name] is a 55 y.o. male who complains of  right elbow pain 3 days. Patient was admitted to the hospital for acute renal failure and 10/19/2015. Patient was admitted from his primary care office after his creatinine was noted to be 18. Patient had 2 weeks of malaise prior to admission.  Patient states that he had vascular access put in his left lower extremity and had to lay on his right side for several days. He wonders whether this initiated the right elbow pain. Patient states he has noted pain and swelling in the right elbow. It is painful to move his right elbow as well. He denies any recent injuries. He has not had previous pain or other issues with his right elbow.  Patient has received hemodialysis during this hospitalization and had labs consistent with pancreatitis and also had an EGD during this hospitalization showing gastritis. Patient has had anemia as well.    Past Medical History  Diagnosis Date  . Hypertriglyceridemia   . Chest pain     Unspecified  . Palpitations   . Hypertension   . Peripheral vascular disease ([place])   . Chronic kidney disease   . Kidney stone on left side    Past Surgical History  Procedure Laterality Date  . Lower extremity interventions x2 right leg Right   . Peripheral vascular catheterization Right 08/20/2014    Procedure: Lower Extremity Angiography;  Surgeon: Algernon Huxley, MD;  Location: [facility];  Service: Cardiovascular;  Laterality: Right;  . Peripheral vascular catheterization Right 08/20/2014    Procedure: Lower Extremity Intervention;  Surgeon: Algernon Huxley, MD;  Location: [facility];  Service: Cardiovascular;  Laterality: Right;  . Extracorporeal shock wave lithotripsy    . Extracorporeal shock wave lithotripsy Left 09/13/2014    Procedure: EXTRACORPOREAL SHOCK WAVE LITHOTRIPSY (ESWL);  Surgeon: Hollice Espy, MD;  Location: [facility];  Service: Urology;  Laterality: Left;  . Esophagogastroduodenoscopy (egd) with propofol N/A 10/22/2015    Procedure: ESOPHAGOGASTRODUODENOSCOPY (EGD) WITH PROPOFOL;  Surgeon: Lucilla Lame, MD;  Location: [facility];  Service: Endoscopy;  Laterality: N/A;   Social History   Social History  . Marital Status: Married    Spouse Name: N/A  . Number of Children: N/A  . Years of Education: N/A   Occupational History  . Full Time    Social History Main Topics  . Smoking status: Former Smoker -- 1.00 packs/day for 37 years    Quit date: 04/21/2013  . Smokeless tobacco: None  . Alcohol Use: 8.4 oz/week    7 Glasses of wine, 7 Cans of beer per week  . Drug Use: No  . Sexual Activity: Not Asked   Other Topics Concern  . None   Social History Narrative   Regular exercise: No   Family History  Problem Relation Age of Onset  . Heart failure     Allergies  Allergen Reactions  . Fenofibrate Other (See Comments)   Prior to Admission medications   Medication Sig Start Date End Date Taking? Authorizing Provider  aspirin EC 81 MG tablet Take 81 mg by mouth daily.   Yes Historical Provider, MD  clopidogrel (PLAVIX) 75 MG tablet Take 75 mg by mouth daily.   Yes Historical Provider, MD  diltiazem (CARDIZEM CD) 120 MG 24 hr capsule Take 120 mg by mouth daily.    Yes Historical Provider, MD  lisinopril-hydrochlorothiazide (PRINZIDE,ZESTORETIC) 20-12.5 MG per tablet Take 1 tablet by mouth daily.   Yes Historical Provider, MD  metoprolol (TOPROL-XL) 200 MG 24 hr tablet Take 200 mg by mouth daily.    Yes Historical Provider, MD  omega-3 acid ethyl esters (LOVAZA) 1 G capsule Take 4 g by mouth daily.    Yes Historical Provider, MD  pantoprazole (PROTONIX) 40 MG tablet Take 40 mg by mouth daily.   Yes Historical Provider, MD  rosuvastatin (CRESTOR) 40 MG tablet Take 40 mg by mouth every evening.   Yes Historical Provider, MD  vitamin B-12 (CYANOCOBALAMIN) 1000 MCG  tablet Take 2,000 mcg by mouth daily.   Yes Historical Provider, MD  HYDROcodone-acetaminophen (NORCO/VICODIN) 5-325 MG per tablet Take 1-2 tablets by mouth every 6 (six) hours as needed for moderate pain. Patient not taking: Reported on 10/12/2014 09/13/14   Hollice Espy, MD  tamsulosin (FLOMAX) 0.4 MG CAPS capsule Take 1 capsule (0.4 mg total) by mouth daily. Patient not taking: Reported on 10/19/2015 09/13/14   Hollice Espy, MD   Dg Chest 2 View  10/25/2015  CLINICAL DATA:  Shortness of Breath EXAM: CHEST  2 VIEW COMPARISON:  October 14, 2006 FINDINGS: Lungs are hyperexpanded. There is minimal scarring in the left base. There is no edema or consolidation. Heart size and pulmonary vascularity are normal. No adenopathy. There is atherosclerotic calcification in the aortic arch. There is degenerative change in the thoracic spine. IMPRESSION: Lungs hyperexpanded. Minimal scarring left base. No edema or consolidation. Aortic atherosclerosis. Electronically Signed   By: Lowella Grip III M.D.   On: 10/25/2015 10:26   Dg Elbow Complete Right (3+view)  10/26/2015  CLINICAL DATA:  Right elbow pain, no injury, swelling. EXAM: RIGHT ELBOW - COMPLETE 3+ VIEW COMPARISON:  None. FINDINGS: No acute osseous abnormality. There may be a joint effusion. Soft tissue swelling is seen along the dorsal aspect of the elbow and forearm. IMPRESSION: 1. No acute osseous abnormality. 2. Suspected joint effusion. 3. Dorsal soft tissue swelling. Electronically Signed   By: Lorin Picket M.D.   On: 10/26/2015 14:41    Positive ROS: All other systems have been reviewed and were otherwise negative with the exception of those mentioned in the HPI and as above.  Physical Exam: General: Alert, no acute distress  MUSCULOSKELETAL: Right elbow: Patient's skin is intact. Patient has swelling in the right elbow with faint erythema in the area of the olecranon. There does not appear to be fluid in the olecranon bursa however. Patient is  keeping his right elbow and a resting position of 90 of flexion. He can flex to 110 and extend to approximately -70 of full extension. He has intact sensation throughout the right upper extremity, intact sensation light touch and a palpable radial pulse. He has tenderness to palpation of the right elbow. His forearm and arm compartments are soft and compressible. There is no ecchymosis seen.  Assessment: Right elbow swelling and pain, possible septic right elbow joint.  Plan: I explained to the patient that his right elbow pain and swelling may indicate a septic right elbow joint. I recommended a bedside elbow aspiration to help confirm this diagnosis. He agreed.  Procedure note:  Patient was prepped with Betadine over the posterolateral right elbow. He was pre-injected with 10 cc of 1% lidocaine plain over the posterior lateral right elbow.  After approximately 10 minutes, an 18-gauge spinal needle was introduced into the triangle formed by the lateral epicondyle of the humerus, radial head and tip  of the olecranon. A small amount of blood-tinged fluid was obtained. I personally deliver this to the lab for a stat Gram stain and culture.  A dry sterile and compressive dressing was applied to the right elbow.  I have also ordered an MRI of the patient's right elbow to evaluate for an intra-articular fluid collection. Following this test to determine whether the patient may need a surgical washout of the right elbow. Patient understood and agreed with this plan.    Thornton Park, MD    10/26/2015 5:13 PM

## 2015-10-26 NOTE — Progress Notes (Signed)
[facility] NAME: Weir Croff    MR#:  [id number]  DATE OF BIRTH:  09/09/1960  SUBJECTIVE:  CHIEF COMPLAINT:   Chief Complaint  Patient presents with  . Abnormal Lab  Had EGD earlier which showed portal hypertensive gastropathy and some gastritis, has good urine output so dialysis on hold Today -Patient is still febrile, c/o rt elbow pain and rt foot redness ,his dialysis catheter and central line was removed 10/24/2015 for low-grade fevers.  REVIEW OF SYSTEMS:  Review of Systems  Constitutional: Positive for malaise/fatigue. Negative for fever, weight loss and diaphoresis.  HENT: Negative for ear discharge, ear pain, hearing loss, nosebleeds, sore throat and tinnitus.   Eyes: Negative for blurred vision and pain.  Respiratory: Negative for cough, hemoptysis, shortness of breath and wheezing.   Cardiovascular: Negative for chest pain, palpitations, orthopnea and leg swelling.  Gastrointestinal: Negative for heartburn, nausea, vomiting, abdominal pain, diarrhea, constipation and blood in stool.  Genitourinary: Negative for dysuria, urgency and frequency.  Musculoskeletal: Negative for myalgias and back pain.  Skin: Negative for itching and rash.  Neurological: Positive for weakness. Negative for dizziness, tingling, tremors, focal weakness, seizures and headaches.  Psychiatric/Behavioral: Negative for depression. The patient is not nervous/anxious.    DRUG ALLERGIES:   Allergies  Allergen Reactions  . Fenofibrate Other (See Comments)   VITALS:  Blood pressure 131/67, pulse 99, temperature 100.5 F (38.1 C), temperature source Oral, resp. rate 20, height 6' (1.829 m), weight 86.274 kg (190 lb 3.2 oz), SpO2 94 %. PHYSICAL EXAMINATION:  Physical Exam  Constitutional: He is oriented to person, place, and time and well-developed, well-nourished, and in no distress.  HENT:  Head: Normocephalic and atraumatic.  Eyes:  Conjunctivae and EOM are normal. Pupils are equal, round, and reactive to light.  Neck: Normal range of motion. Neck supple. No tracheal deviation present. No thyromegaly present.  Cardiovascular: Normal rate, regular rhythm and normal heart sounds.   Pulmonary/Chest: Effort normal and breath sounds normal. No respiratory distress. He has no wheezes. He exhibits no tenderness.  Abdominal: Soft. Bowel sounds are normal. He exhibits no distension. There is no tenderness.  Musculoskeletal: Normal range of motion.  Neurological: He is alert and oriented to person, place, and time. No cranial nerve deficit.  Skin: Skin is warm and dry. No rash noted.  Psychiatric: Mood and affect normal.   LABORATORY PANEL:   CBC  Recent Labs Lab 10/26/15 0507 10/26/15 1507  WBC 11.7*  --   HGB 6.7* 8.5*  HCT 20.1* 24.5*  PLT 138*  --    ------------------------------------------------------------------------------------------------------------------ Chemistries   Recent Labs Lab 10/26/15 0507 10/26/15 0958 10/26/15 1507  NA 138  --   --   K 2.9* 2.7*  --   CL 104  --   --   CO2 22  --   --   GLUCOSE 97  --   --   BUN 51*  --   --   CREATININE 6.72*  --   --   CALCIUM 5.4*  --   --   MG 0.9* 1.5* 2.0   RADIOLOGY:  Dg Elbow Complete Right (3+view)  10/26/2015  CLINICAL DATA:  Right elbow pain, no injury, swelling. EXAM: RIGHT ELBOW - COMPLETE 3+ VIEW COMPARISON:  None. FINDINGS: No acute osseous abnormality. There may be a joint effusion. Soft tissue swelling is seen along the dorsal aspect of the elbow and forearm. IMPRESSION: 1. No acute osseous abnormality.  2. Suspected joint effusion. 3. Dorsal soft tissue swelling. Electronically Signed   By: Lorin Picket M.D.   On: 10/26/2015 14:41   ASSESSMENT AND PLAN:  This is a 55 year old male admitted for acute renal failure and pancreatitis.  #Sepsis Pancultures were obtained 7/21 Chest x-rays negative  dialysis catheter from the groin and  central line were removed on July 20 Started patient on empiric IV antibiotics zosyn and vanc  # Anemia -  EGD 10/23/2015  showed gastritis and mild portal hypertensive gastropathyportal gasropathy with renal component?  HB 6.9- PRBC 1 unit today FOBT check  Hold asa and plavix  #. Acute renal failure: 2/2 ATN-patient's baseline creatinine is 1 on 06/07/2015 -Creatinine 9.09-8.14-7.31 -Status post removal of the hemodialysis catheter as patient developed low-grade temperature - Most of the lab work so far has been negative - Urine microscopic exam showed muddy brown casts suggestive of ATN - Dialysis per nephrology, has good urine output so dialysis on hold for now.   #. Pancreatitis: amylase and lipase coming down, now resolved.  Could be due to hypertriglyceridemia. Avoid oral calcium supplements   #hypokalemia and hypomagnesemia  replete potassium and mag recheck in a.m. including magnesium  #. Essential hypertension: Stop lisinopril - hydrochlorothiazide. Continue metoprolol and diltiazem. Clonidine dose increased to 0.2 mg by mouth twice a day  #. Hypercholesterolemia: Continue crestor  #. BPH: Continue Flomax  #.  Multiple kidney stones: Nonobstructive, can follow up with urology as an outpatient  Physical therapy has recommended outpatient PT   GI prophylaxis: As above DVT prophylaxis: SCD  as the patient has an active GI bleed   Discontinue telemetry   All the records are reviewed and case discussed with Care Management/Social Worker. Management plans discussed with the patient and nursing.  Discussed with Dr. Candiss Norse  CODE STATUS: FULL [place] THIS PATIENT: 35 minutes.   More than 50% of the time was spent in counseling/coordination of care: YES  POSSIBLE D/C IN AM, DEPENDING ON CLINICAL CONDITION. AND KIDNEY FUNCTION   Nicholes Mango M.D on 10/26/2015 at 4:21 PM  Between 7am to 6pm - Pager - [phone]  After 6pm go to [email or web address] -  Proofreader  [facility]  Office  [phone]  CC: Primary care physician; Kirk Ruths., MD  Note: This dictation was prepared with Dragon dictation along with smaller phrase technology. Any transcriptional errors that result from this process are unintentional.

## 2015-10-26 NOTE — Progress Notes (Signed)
Calcium of 5.4 was called to Dr. Margaretmary Eddy.  No new orders given via telephone but states to also notify Dr. Candiss Norse.

## 2015-10-26 NOTE — Progress Notes (Signed)
Subjective:  Patient had a fever overnight. T max 101 Reports pain and swelling in the right elbow Temporary dialysis catheter was removed. Patient S Cr is down to 6.7  No SOB. Trace right leg edema. Potassium is low at 2.9. Calcium also low at 5.4 Appetite is ok. No N/V or SOB reported   Objective:  Vital signs in last 24 hours:  Temp:  [99.1 F (37.3 C)-101 F (38.3 C)] 99.7 F (37.6 C) (07/22 0507) Pulse Rate:  [84-97] 92 (07/22 0922) Resp:  [16-17] 16 (07/22 0507) BP: (95-130)/(49-76) 100/58 mmHg (07/22 0922) SpO2:  [93 %-97 %] 94 % (07/22 0507) Weight:  [86.274 kg (190 lb 3.2 oz)] 86.274 kg (190 lb 3.2 oz) (07/22 0507)  Weight change: -0.091 kg (-3.2 oz) Filed Weights   10/24/15 0500 10/25/15 0500 10/26/15 0507  Weight: 85.911 kg (189 lb 6.4 oz) 86.365 kg (190 lb 6.4 oz) 86.274 kg (190 lb 3.2 oz)    Intake/Output:    Intake/Output Summary (Last 24 hours) at 10/26/15 1012 Last data filed at 10/26/15 0500  Gross per 24 hour  Intake   2244 ml  Output      0 ml  Net   2244 ml     Physical Exam: General: No acute distress  HEENT anicteric, moist mucous membranes  Neck supple  Pulm/lungs Normal breathing effort, clear to auscultation bilaterally  CVS/Heart Regular, no rub or gallop  Abdomen:  Soft, nontender  Extremities:  no peripheral edema, right elbow warm, tender  Neurologic: Alert, oriented  Skin: no acute rashes          Basic Metabolic Panel:   Recent Labs Lab 10/20/15 0544 10/21/15 0636  10/23/15 0420 10/23/15 1505 10/24/15 0359 10/25/15 0512 10/26/15 0507  NA 140 138  < > 143 143 141  141 138 138  K 4.2 3.8  < > 3.0* 3.0* 3.0*  2.9* 2.9* 2.9*  CL 105 104  < > 107 105 106  106 105 104  CO2 20* 19*  < > 28 26 24  24 23 22   GLUCOSE 88 120*  < > 117* 137* 116*  116* 128* 97  BUN 89* 88*  < > 47* 48* 43*  43* 45* 51*  CREATININE 13.10* 12.82*  < > 9.15* 9.09* 8.14*  8.35* 7.31* 6.72*  CALCIUM 7.1* 6.3*  < > 6.8* 6.8* 6.4*  6.4*  5.8* 5.4*  MG  --   --   --   --   --   --   --  0.9*  PHOS 8.9* 8.4*  --  5.3*  --  3.1  --   --   < > = values in this interval not displayed.   CBC:  Recent Labs Lab 10/21/15 0636 10/22/15 0607 10/23/15 0420 10/24/15 0359 10/26/15 0507  WBC 6.5 6.1 8.7 12.0* 11.7*  NEUTROABS  --   --   --   --  9.0*  HGB 8.2* 7.8* 7.5* 7.7* 6.7*  HCT 23.9* 22.3* 21.9* 23.0* 20.1*  MCV 93.9 94.0 95.9 94.1 95.6  PLT 154 143* 150 172 138*      Microbiology:  Recent Results (from the past 720 hour(s))  Giardia/Cryptosporidium EIA     Status: None   Collection Time: 10/22/15  1:40 AM  Result Value Ref Range Status   Giardia Ag, Stl Negative Negative Final    Comment: (NOTE) Performed At: [facility] [place], [place] [id number] Lindon Romp MD [id number]  Cryptosporidium EIA Negative Negative Final    Comment: (NOTE) Performed At: [facility] [place], [place] [id number] Lindon Romp MD [id number]   CULTURE, BLOOD (ROUTINE X 2) w Reflex to ID Panel     Status: None (Preliminary result)   Collection Time: 10/25/15 10:15 AM  Result Value Ref Range Status   Specimen Description BLOOD LEFT HAND  Final   Special Requests   Final    BOTTLES DRAWN AEROBIC AND ANAEROBIC  AEROBIC 3CC, ANAEROBIC 1CC   Culture NO GROWTH < 24 HOURS  Final   Report Status PENDING  Incomplete  CULTURE, BLOOD (ROUTINE X 2) w Reflex to ID Panel     Status: None (Preliminary result)   Collection Time: 10/25/15 10:16 AM  Result Value Ref Range Status   Specimen Description BLOOD LEFT ASSIST CONTROL  Final   Special Requests BOTTLES DRAWN AEROBIC AND ANAEROBIC [place]  Final   Culture NO GROWTH < 24 HOURS  Final   Report Status PENDING  Incomplete    Coagulation Studies: No results for input(s): LABPROT, INR in the last 72 hours.  Urinalysis:  Recent Labs  10/25/15 1832  COLORURINE STRAW*  LABSPEC 1.008  PHURINE 7.0  GLUCOSEU 50*  HGBUR 1+*   BILIRUBINUR NEGATIVE  KETONESUR NEGATIVE  PROTEINUR 30*  NITRITE NEGATIVE  LEUKOCYTESUR NEGATIVE      Imaging: Dg Chest 2 View  10/25/2015  CLINICAL DATA:  Shortness of Breath EXAM: CHEST  2 VIEW COMPARISON:  October 14, 2006 FINDINGS: Lungs are hyperexpanded. There is minimal scarring in the left base. There is no edema or consolidation. Heart size and pulmonary vascularity are normal. No adenopathy. There is atherosclerotic calcification in the aortic arch. There is degenerative change in the thoracic spine. IMPRESSION: Lungs hyperexpanded. Minimal scarring left base. No edema or consolidation. Aortic atherosclerosis. Electronically Signed   By: Lowella Grip III M.D.   On: 10/25/2015 10:26     Medications:   . sodium chloride 75 mL/hr at 10/26/15 0053   . sodium chloride   Intravenous Once  . calcium carbonate  500 mg of elemental calcium Oral BID WC  . cloNIDine  0.1 mg Oral BID  . diltiazem  120 mg Oral Daily  . docusate sodium  100 mg Oral BID  . folic acid  1 mg Oral Daily  . heparin  5,000 Units Intravenous Once  . [START ON 10/27/2015] levofloxacin (LEVAQUIN) IV  500 mg Intravenous Q48H  . metoprolol  200 mg Oral Daily  . multivitamin with minerals  1 tablet Oral Daily  . omega-3 acid ethyl esters  4 g Oral Daily  . pantoprazole (PROTONIX) IV  40 mg Intravenous Q12H  . piperacillin-tazobactam (ZOSYN)  IV  3.375 g Intravenous Q12H  . rosuvastatin  40 mg Oral QPM  . sodium bicarbonate  1,300 mg Oral BID  . sodium chloride flush  3 mL Intravenous Q12H  . tamsulosin  0.4 mg Oral Daily  . thiamine  100 mg Oral Daily  . vitamin B-12  2,000 mcg Oral Daily   acetaminophen **OR** acetaminophen, ondansetron **OR** ondansetron (ZOFRAN) IV  Assessment/ Plan:  55 y.o. male originally from [place], history of alcohol abuse, hepatitis, peripheral vascular disease, hypertension, hyperlipidemia, BPH, admitted to [facility] on July 15 for acute renal failure   1. Acute  Renal Failure: baseline creatinine of 1 on 06/07/15 2. Hyperkalemia, now hypokalemia 3. Metabolic acidosis 4. Anemia with renal failure: 2 units PRBC on 7/15  5.  Hypocalcemia: calcium of 5.4 6. Acute pancreatitis 7.  Hyperlipidemia, severe 8. Fever - T max 101, right elbow pain and swelling  Serological studies have been negative so far.  Urine protein/creatine ratio is 0.6. Urine microscopic exam performed by Dr. Juleen China showed muddy brown cast suggesting ATN. Urine output is improving. S Cr improved to 6.7 today without dialysis. Will continue to monitor renal function on a daily basis.   - Continue to monitor urine output and electrolytes closely. - Replace potassium as needed.  - agree with broadening be antibiotic coverage by adding vancomycin - Agree with orthopedic evaluation    LOS: 7 Haley Fuerstenberg 7/22/201710:12 AM

## 2015-10-26 NOTE — Progress Notes (Signed)
Pharmacy Antibiotic Note  [name] [name] is a 55 y.o. male admitted on 10/19/2015 with sepsis.  Pharmacy has been consulted for vancomycin dosing.  Plan: Will plan for vancomycin 1000 mg IV q 48 hours. Goal trough 15-20 mcg/mL. Due to patients renal function and not on current dialysis, will get random vanc level in 24 hours and adjust dose accordingly. Will monitor for initiation dialysis and renal function.  Height: 6' (182.9 cm) Weight: 190 lb 3.2 oz (86.274 kg) IBW/kg (Calculated) : 77.6  Temp (24hrs), Avg:99.6 F (37.6 C), Min:99.1 F (37.3 C), Max:101 F (38.3 C)   Recent Labs Lab 10/21/15 0636 10/22/15 0607 10/23/15 0420 10/23/15 1505 10/24/15 0359 10/25/15 0512 10/26/15 0507  WBC 6.5 6.1 8.7  --  12.0*  --  11.7*  CREATININE 12.82* 8.39* 9.15* 9.09* 8.14*  8.35* 7.31* 6.72*    Estimated Creatinine Clearance: 13.5 mL/min (by C-G formula based on Cr of 6.72).    Allergies  Allergen Reactions  . Fenofibrate Other (See Comments)    Antimicrobials this admission: Levofloxacin 07/21 >> 07/22 Piperacillin-tazobactam 07/21 >> Vancomycin 07/22 >>  Microbiology results: 07/21 BCx: Pending 07/21 UCx: Pending  Thank you for allowing pharmacy to be a part of this patient's care.  Darrow Bussing, PharmD 10/26/2015 10:53 AM

## 2015-10-26 NOTE — Progress Notes (Signed)
Patient temp 101. Medicated with Tylenol per order.  [name] [name] [name] [name]

## 2015-10-26 NOTE — Progress Notes (Signed)
Dr. Candiss Norse has been notified that calcium is 5.4

## 2015-10-26 NOTE — Progress Notes (Addendum)
Temp. 102.8 axillary. Dr. Ara Kussmaul notified. Orders to get blood cultures if not done. Blood cultures done on 10/25/15.

## 2015-10-26 NOTE — Progress Notes (Signed)
Per Lab, RN is not allowed to collect stool on hemacult cards, they have to send stood specimen to lab in sterile cup.  Patient has not had a bowel movement this afternoon since order for stool hemacult placed. Lauris Poag, RN 10/26/15 at [phone]

## 2015-10-26 NOTE — Progress Notes (Signed)
Dr. Candiss Norse notified of 6.7 Hgb and Dr. Rinaldo Ratel request to transfuse 1 unit. Dr. Candiss Norse in agreement with the order. [name] [name] L Mozelle Remlinger

## 2015-10-26 NOTE — Progress Notes (Signed)
Dr. Estanislado Pandy notified of Critical Mag of 0.9. New order received for Mag Sulfate IV x 1.

## 2015-10-26 NOTE — Plan of Care (Signed)
Problem: Physical Regulation: Goal: Will remain free from infection Outcome: Progressing Patient experienced temp of 101 during the shift. After Tylenol and IV antibiotic, temp decreased to 99.1.

## 2015-10-27 ENCOUNTER — Inpatient Hospital Stay: Payer: 59

## 2015-10-27 LAB — BASIC METABOLIC PANEL
Anion gap: 12 (ref 5–15)
BUN: 40 mg/dL — ABNORMAL HIGH (ref 6–20)
CALCIUM: 5.6 mg/dL — AB (ref 8.9–10.3)
CHLORIDE: 105 mmol/L (ref 101–111)
CO2: 23 mmol/L (ref 22–32)
CREATININE: 5.23 mg/dL — AB (ref 0.61–1.24)
GFR calc non Af Amer: 11 mL/min — ABNORMAL LOW (ref >60)
GFR, EST AFRICAN AMERICAN: 13 mL/min — AB (ref >60)
Glucose, Bld: 105 mg/dL — ABNORMAL HIGH (ref 65–99)
Potassium: 2.9 mmol/L — ABNORMAL LOW (ref 3.5–5.1)
SODIUM: 140 mmol/L (ref 135–145)

## 2015-10-27 LAB — TYPE AND SCREEN
ABO/RH(D): O POS
ANTIBODY SCREEN: NEGATIVE
Unit division: 0

## 2015-10-27 LAB — MAGNESIUM: Magnesium: 1.8 mg/dL (ref 1.7–2.4)

## 2015-10-27 LAB — URINE CULTURE: SPECIAL REQUESTS: NORMAL

## 2015-10-27 LAB — POTASSIUM: POTASSIUM: 3.6 mmol/L (ref 3.5–5.1)

## 2015-10-27 LAB — OCCULT BLOOD X 1 CARD TO LAB, STOOL: Fecal Occult Bld: NEGATIVE

## 2015-10-27 LAB — VANCOMYCIN, RANDOM: Vancomycin Rm: 10

## 2015-10-27 MED ORDER — POTASSIUM CHLORIDE CRYS ER 20 MEQ PO TBCR
40.0000 meq | EXTENDED_RELEASE_TABLET | Freq: Once | ORAL | Status: AC
  Administered 2015-10-27: 40 meq via ORAL
  Filled 2015-10-27: qty 2

## 2015-10-27 MED ORDER — SODIUM CHLORIDE 0.9 % IV BOLUS (SEPSIS)
250.0000 mL | Freq: Once | INTRAVENOUS | Status: AC
  Administered 2015-10-27: 250 mL via INTRAVENOUS

## 2015-10-27 MED ORDER — POTASSIUM CHLORIDE CRYS ER 20 MEQ PO TBCR: 40.0000 meq | EXTENDED_RELEASE_TABLET | Freq: Every day | ORAL | Status: DC

## 2015-10-27 MED ORDER — POTASSIUM CHLORIDE 20 MEQ PO PACK
40.0000 meq | PACK | Freq: Every day | ORAL | Status: DC
Start: 2015-10-28 — End: 2015-10-30
  Administered 2015-10-28 – 2015-10-30 (×3): 40 meq via ORAL
  Filled 2015-10-27 (×3): qty 2

## 2015-10-27 MED ORDER — SODIUM CHLORIDE 0.9 % IV SOLN
INTRAVENOUS | Status: AC
  Administered 2015-10-27: 11:00:00 via INTRAVENOUS

## 2015-10-27 MED ORDER — VANCOMYCIN HCL IN DEXTROSE 1-5 GM/200ML-% IV SOLN
1000.0000 mg | INTRAVENOUS | Status: DC
  Administered 2015-10-27: 1000 mg via INTRAVENOUS
  Filled 2015-10-27: qty 200

## 2015-10-27 MED ORDER — POTASSIUM CHLORIDE 10 MEQ/100ML IV SOLN
10.0000 meq | INTRAVENOUS | Status: DC
  Administered 2015-10-27: 10 meq via INTRAVENOUS
  Filled 2015-10-27 (×4): qty 100

## 2015-10-27 MED ORDER — POTASSIUM CHLORIDE 20 MEQ PO PACK
40.0000 meq | PACK | Freq: Once | ORAL | Status: AC
  Administered 2015-10-27: 40 meq via ORAL
  Filled 2015-10-27: qty 2

## 2015-10-27 NOTE — Progress Notes (Signed)
Pharmacy Antibiotic Note  [name] [name] is a 55 y.o. male admitted on 10/19/2015 with sepsis.  Pharmacy has been consulted for vancomycin dosing.  Plan: Will plan for vancomycin 1000 mg IV q 48 hours. Goal trough 15-20 mcg/mL. Due to patients renal function and not on current dialysis, will get random vanc level in 24 hours and adjust dose accordingly. Will monitor for initiation dialysis and renal function.  7/23:  Vanc random level was 10 at 1050 (not reported until 1714 due to lab instrument being down). Will give another dose of Vancomycin 1gm IV x1 with a plan for Vancomycin 1gm IV every 48 hours.  Ordered another Vanc random for 7/24 at 1400.  Will continue to monitor renal function and labs and make adjustments as needed.   Height: 6' (182.9 cm) Weight: 185 lb 6.4 oz (84.1 kg) IBW/kg (Calculated) : 77.6  Temp (24hrs), Avg:100.5 F (38.1 C), Min:98.6 F (37 C), Max:102.8 F (39.3 C)   Recent Labs Lab 10/21/15 0636 10/22/15 0607 10/23/15 0420 10/23/15 1505 10/24/15 0359 10/25/15 0512 10/26/15 0507 10/27/15 0623 10/27/15 1050  WBC 6.5 6.1 8.7  --  12.0*  --  11.7*  --   --   CREATININE 12.82* 8.39* 9.15* 9.09* 8.14*  8.35* 7.31* 6.72* 5.23*  --   VANCORANDOM  --   --   --   --   --   --   --   --  10    Estimated Creatinine Clearance: 17.3 mL/min (by C-G formula based on SCr of 5.23 mg/dL).    Allergies  Allergen Reactions  . Fenofibrate Other (See Comments)    Antimicrobials this admission: Levofloxacin 07/21 >> 07/22 Piperacillin-tazobactam 07/21 >> Vancomycin 07/22 >>  Microbiology results: 07/21 BCx: Pending 07/21 UCx: Pending  Thank you for allowing pharmacy to be a part of this patient's care.  Nancy Fetter, PharmD Clinical Pharmacist 10/27/2015 5:39 PM

## 2015-10-27 NOTE — Progress Notes (Signed)
Dr. Candiss Norse was notified of critical calcium of 5.6 per Dr. Rinaldo Ratel request.  No new orders were given.

## 2015-10-27 NOTE — Progress Notes (Addendum)
[place] [facility] [place] NAME: Uriah Klabunde    MR#:  [id number]  DATE OF BIRTH:  05/07/1960  SUBJECTIVE:  CHIEF COMPLAINT:   Chief Complaint  Patient presents with  . Abnormal Lab  Had EGD earlier which showed portal hypertensive gastropathy and some gastritis, has good urine output so dialysis on hold Today -Patient is still febrile last night, c/o rt elbow pain and rt foot redness,Joint fluid extracted by orthopedics yesterday ,his dialysis catheter and central line was removed 10/24/2015 for low-grade fevers.  REVIEW OF SYSTEMS:  Review of Systems  Constitutional: Positive for malaise/fatigue. Negative for diaphoresis, fever and weight loss.  HENT: Negative for ear discharge, ear pain, hearing loss, nosebleeds, sore throat and tinnitus.   Eyes: Negative for blurred vision and pain.  Respiratory: Negative for cough, hemoptysis, shortness of breath and wheezing.   Cardiovascular: Negative for chest pain, palpitations, orthopnea and leg swelling.  Gastrointestinal: Negative for abdominal pain, blood in stool, constipation, diarrhea, heartburn, nausea and vomiting.  Genitourinary: Negative for dysuria, frequency and urgency.  Musculoskeletal: Positive for joint pain. Negative for back pain and myalgias.  Skin: Negative for itching and rash.  Neurological: Positive for weakness. Negative for dizziness, tingling, tremors, focal weakness, seizures and headaches.  Psychiatric/Behavioral: Negative for depression. The patient is not nervous/anxious.    DRUG ALLERGIES:   Allergies  Allergen Reactions  . Fenofibrate Other (See Comments)   VITALS:  Blood pressure 112/64, pulse 93, temperature 98.6 F (37 C), temperature source Oral, resp. rate 20, height 6' (1.829 m), weight 84.1 kg (185 lb 6.4 oz), SpO2 92 %. PHYSICAL EXAMINATION:  Physical Exam  Constitutional: He is oriented to person, place, and time and well-developed, well-nourished,  and in no distress.  HENT:  Head: Normocephalic and atraumatic.  Eyes: Conjunctivae and EOM are normal. Pupils are equal, round, and reactive to light.  Neck: Normal range of motion. Neck supple. No tracheal deviation present. No thyromegaly present.  Cardiovascular: Normal rate, regular rhythm and normal heart sounds.   Pulmonary/Chest: Effort normal and breath sounds normal. No respiratory distress. He has no wheezes. He exhibits no tenderness.  Abdominal: Soft. Bowel sounds are normal. He exhibits no distension. There is no tenderness.  Musculoskeletal: Normal range of motion.  Right elbow joint is tender and decreased range of motion status post arthrocentesis  Neurological: He is alert and oriented to person, place, and time. No cranial nerve deficit.  Skin: Skin is warm and dry. No rash noted.  Psychiatric: Mood and affect normal.   LABORATORY PANEL:   CBC  Recent Labs Lab 10/26/15 0507 10/26/15 1507  WBC 11.7*  --   HGB 6.7* 8.5*  HCT 20.1* 24.5*  PLT 138*  --    ------------------------------------------------------------------------------------------------------------------ Chemistries   Recent Labs Lab 10/27/15 0623  NA 140  K 2.9*  CL 105  CO2 23  GLUCOSE 105*  BUN 40*  CREATININE 5.23*  CALCIUM 5.6*  MG 1.8   RADIOLOGY:  Dg Elbow Complete Right (3+view)  Result Date: 10/26/2015 CLINICAL DATA:  Right elbow pain, no injury, swelling. EXAM: RIGHT ELBOW - COMPLETE 3+ VIEW COMPARISON:  None. FINDINGS: No acute osseous abnormality. There may be a joint effusion. Soft tissue swelling is seen along the dorsal aspect of the elbow and forearm. IMPRESSION: 1. No acute osseous abnormality. 2. Suspected joint effusion. 3. Dorsal soft tissue swelling. Electronically Signed   By: Lorin Picket M.D.   On: 10/26/2015 14:41   ASSESSMENT  AND PLAN:  This is a 55 year old male admitted for acute renal failure and pancreatitis.  #SepsisCould be from septic joint-right  elbow Pancultures were obtained 7/21 MRI of the right elbow is pending. Appreciate orthopedics recommendations Blood cultures are negative in the past 48 hours Body fluid cultures from the right elbow with no growth so far Urine culture is with multiple species which is contaminated Chest x-rays negative  dialysis catheter from the groin and central line were removed on July 20 patient on empiric IV antibiotics zosyn and vanc  #Right foot cellulitis Continue Zosyn and vancomycin. Uric acid level is normal given history of gout  # Anemia -  EGD 10/23/2015  showed gastritis and mild portal hypertensive gastropathyportal gasropathy with renal component?  HB 6.9- PRBC 1 unit , repeat hemoglobin 8.5 FOBT pending Hold asa and plavix  #. Acute renal failure: 2/2 ATN-patient's baseline creatinine is 1 on 06/07/2015 -Creatinine 9.09-8.14-7.31-5.23 -Status post removal of the hemodialysis catheter as patient developed low-grade temperature - Most of the lab work so far has been negative - Urine microscopic exam showed muddy brown casts suggestive of ATN - Dialysis per nephrology, has good urine output so dialysis on hold for now.   #. Pancreatitis: amylase and lipase coming down, now resolved.  Could be due to hypertriglyceridemia. Avoid oral calcium supplements   #hypokalemia and hypomagnesemia  replete potassium and mag recheck in a.m. including magnesium  #. Essential hypertension: Stop lisinopril - hydrochlorothiazide. Continue metoprolol and diltiazem. Clonidine dose increased to 0.2 mg by mouth twice a day  #. Hypercholesterolemia: Continue crestor  #. BPH: Continue Flomax  #.  Multiple kidney stones: Nonobstructive, can follow up with urology as an outpatient  Physical therapy has recommended outpatient PT   GI prophylaxis: As above DVT prophylaxis: SCD  as the patient has an active GI bleed   Discontinue telemetry   All the records are reviewed and case discussed with Care  Management/Social Worker. Management plans discussed with the patient and nursing.  Discussed with Dr. Candiss Norse  CODE STATUS: FULL [place] THIS PATIENT: 35 minutes.   More than 50% of the time was spent in counseling/coordination of care: YES  POSSIBLE D/C IN AM, DEPENDING ON CLINICAL CONDITION. AND KIDNEY FUNCTION   Nicholes Mango M.D on 10/27/2015 at 1:29 PM  Between 7am to 6pm - Pager - [phone]  After 6pm go to [email or web address] - Proofreader  [facility] [place] [facility]  Office  [phone]  CC: Primary care physician; Kirk Ruths., MD  Note: This dictation was prepared with Dragon dictation along with smaller phrase technology. Any transcriptional errors that result from this process are unintentional.

## 2015-10-27 NOTE — Progress Notes (Signed)
Subjective:  Patient seen in his hospital room. He states right elbow pain has improved on oxycodone. Patient reports pain as mild to moderate.  Complains of pain and swelling in right foot.  Objective:   VITALS:   Vitals:   10/27/15 0500 10/27/15 0540 10/27/15 1049 10/27/15 1426  BP:  (!) 104/57 112/64 (!) 101/59  Pulse:  86 93 85  Resp:  20  18  Temp:  98.6 F (37 C)  100 F (37.8 C)  TempSrc:  Oral  Oral  SpO2:  97% 92% 94%  Weight: 84.1 kg (185 lb 6.4 oz)     Height:        PHYSICAL EXAM:  Right elbow: Skin intact. No erythema or ecchymosis. Mild swelling. Range of motion is from -30 to 120 flexion. He has approximately 80 of pronation supination without significant pain. He has intact sensation light touch and full digital motion. He has palpable radial pulse and his fingers are well-perfused.  Right foot patient has edema in the right foot with overlying erythema extending from just proximal to the toes to the ankle. He dorsiflex to 20 and plantarflex to about 60 without significant pain. There is no streaking over the right leg.   LABS  Results for orders placed or performed during the hospital encounter of 10/19/15 (from the past 24 hour(s))  Potassium     Status: Abnormal   Collection Time: 10/26/15  5:47 PM  Result Value Ref Range   Potassium 2.8 (L) 3.5 - 5.1 mmol/L  Magnesium     Status: None   Collection Time: 10/26/15  5:47 PM  Result Value Ref Range   Magnesium 1.9 1.7 - 2.4 mg/dL  Occult blood card to lab, stool RN will collect     Status: None   Collection Time: 10/26/15 11:02 PM  Result Value Ref Range   Fecal Occult Bld NEGATIVE NEGATIVE  Basic metabolic panel     Status: Abnormal   Collection Time: 10/27/15  6:23 AM  Result Value Ref Range   Sodium 140 135 - 145 mmol/L   Potassium 2.9 (L) 3.5 - 5.1 mmol/L   Chloride 105 101 - 111 mmol/L   CO2 23 22 - 32 mmol/L   Glucose, Bld 105 (H) 65 - 99 mg/dL   BUN 40 (H) 6 - 20 mg/dL   Creatinine,  Ser 5.23 (H) 0.61 - 1.24 mg/dL   Calcium 5.6 (LL) 8.9 - 10.3 mg/dL   GFR calc non Af Amer 11 (L) >60 mL/min   GFR calc Af Amer 13 (L) >60 mL/min   Anion gap 12 5 - 15  Magnesium     Status: None   Collection Time: 10/27/15  6:23 AM  Result Value Ref Range   Magnesium 1.8 1.7 - 2.4 mg/dL  Occult blood card to lab, stool RN will collect     Status: None   Collection Time: 10/27/15 11:09 AM  Result Value Ref Range   Fecal Occult Bld NEGATIVE NEGATIVE  Potassium     Status: None   Collection Time: 10/27/15  2:52 PM  Result Value Ref Range   Potassium 3.6 3.5 - 5.1 mmol/L    Dg Elbow Complete Right (3+view)  Result Date: 10/26/2015 CLINICAL DATA:  Right elbow pain, no injury, swelling. EXAM: RIGHT ELBOW - COMPLETE 3+ VIEW COMPARISON:  None. FINDINGS: No acute osseous abnormality. There may be a joint effusion. Soft tissue swelling is seen along the dorsal aspect of the elbow and forearm. IMPRESSION: 1.  No acute osseous abnormality. 2. Suspected joint effusion. 3. Dorsal soft tissue swelling. Electronically Signed   By: Lorin Picket M.D.   On: 10/26/2015 14:41    Assessment/Plan: 5 Days Post-Op   Active Problems:   AKI (acute kidney injury) ([place])   Absolute anemia   Other acute pancreatitis   Acute blood loss anemia   Esophagitis, unspecified   Portal hypertension ([place])  Patient is scheduled for his MRI of the right elbow today. His Gram stain for the right elbow aspirate shows white cells but no organisms to date. He'll follow up on his cultures and MRI results. If there is evidence of septic joint he may require an open washout of the right elbow. Patient be made nothing by mouth after midnight in case surgery is required tomorrow.    Thornton Park , MD 10/27/2015, 4:44 PM

## 2015-10-27 NOTE — Progress Notes (Signed)
md notified of decreased  Bp, orders given

## 2015-10-27 NOTE — Progress Notes (Signed)
Subjective:  Patient continues to have a fever overnight. T max 102.8 Reports pain and swelling in the right elbow. Joint aspiration was performed by orthopedics. Small amount of blood-tinged fluid was obtained.  Lab analysis is pending.  Temporary dialysis catheter was removed. Patient S Cr is down to 5.2 nd is improving in a satisfactory rate  No SOB. Trace right leg edema. Potassium emains low at 2.9. Calcium also low at 5.6. patient is getting oral calcium supplements Appetite is ok. No N/V or SOB reported He has not able to tolerate IV potassium supplementation   Objective:  Vital signs in last 24 hours:  Temp:  [98.6 F (37 C)-102.8 F (39.3 C)] 98.6 F (37 C) (07/23 0540) Pulse Rate:  [86-105] 93 (07/23 1049) Resp:  [20] 20 (07/23 0540) BP: (104-112)/(56-64) 112/64 (07/23 1049) SpO2:  [92 %-97 %] 92 % (07/23 1049) Weight:  [84.1 kg (185 lb 6.4 oz)] 84.1 kg (185 lb 6.4 oz) (07/23 0500)  Weight change: -2.177 kg (-4 lb 12.8 oz) Filed Weights   10/25/15 0500 10/26/15 0507 10/27/15 0500  Weight: 86.4 kg (190 lb 6.4 oz) 86.3 kg (190 lb 3.2 oz) 84.1 kg (185 lb 6.4 oz)    Intake/Output:    Intake/Output Summary (Last 24 hours) at 10/27/15 1337 Last data filed at 10/27/15 1000  Gross per 24 hour  Intake              690 ml  Output             3125 ml  Net            -2435 ml     Physical Exam: General: No acute distress  HEENT anicteric, moist mucous membranes  Neck supple  Pulm/lungs Normal breathing effort, clear to auscultation bilaterally  CVS/Heart Regular, no rub or gallop  Abdomen:  Soft, nontender  Extremities:  no peripheral edema, right elbow bandage noted  Neurologic: Alert, oriented  Skin: no acute rashes          Basic Metabolic Panel:   Recent Labs Lab 10/21/15 0636  10/23/15 0420 10/23/15 1505 10/24/15 0359 10/25/15 0512 10/26/15 0507 10/26/15 0958 10/26/15 1507 10/26/15 1747 10/27/15 0623  NA 138  < > 143 143 141  141 138 138   --   --   --  140  K 3.8  < > 3.0* 3.0* 3.0*  2.9* 2.9* 2.9* 2.7*  --  2.8* 2.9*  CL 104  < > 107 105 106  106 105 104  --   --   --  105  CO2 19*  < > 28 26 24  24 23 22   --   --   --  23  GLUCOSE 120*  < > 117* 137* 116*  116* 128* 97  --   --   --  105*  BUN 88*  < > 47* 48* 43*  43* 45* 51*  --   --   --  40*  CREATININE 12.82*  < > 9.15* 9.09* 8.14*  8.35* 7.31* 6.72*  --   --   --  5.23*  CALCIUM 6.3*  < > 6.8* 6.8* 6.4*  6.4* 5.8* 5.4*  --   --   --  5.6*  MG  --   --   --   --   --   --  0.9* 1.5* 2.0 1.9 1.8  PHOS 8.4*  --  5.3*  --  3.1  --   --   --   --   --   --   < > =  values in this interval not displayed.   CBC:  Recent Labs Lab 10/21/15 0636 10/22/15 0607 10/23/15 0420 10/24/15 0359 10/26/15 0507 10/26/15 1507  WBC 6.5 6.1 8.7 12.0* 11.7*  --   NEUTROABS  --   --   --   --  9.0*  --   HGB 8.2* 7.8* 7.5* 7.7* 6.7* 8.5*  HCT 23.9* 22.3* 21.9* 23.0* 20.1* 24.5*  MCV 93.9 94.0 95.9 94.1 95.6  --   PLT 154 143* 150 172 138*  --       Microbiology:  Recent Results (from the past 720 hour(s))  Giardia/Cryptosporidium EIA     Status: None   Collection Time: 10/22/15  1:40 AM  Result Value Ref Range Status   Giardia Ag, Stl Negative Negative Final    Comment: (NOTE) Performed At: [facility] [place], [place] [id number] Lindon Romp MD [id number]    Cryptosporidium EIA Negative Negative Final    Comment: (NOTE) Performed At: [facility] [place], [place] [id number] Lindon Romp MD [id number]   CULTURE, BLOOD (ROUTINE X 2) w Reflex to ID Panel     Status: None (Preliminary result)   Collection Time: 10/25/15 10:15 AM  Result Value Ref Range Status   Specimen Description BLOOD LEFT HAND  Final   Special Requests   Final    BOTTLES DRAWN AEROBIC AND ANAEROBIC  AEROBIC 3CC, ANAEROBIC 1CC   Culture NO GROWTH 2 DAYS  Final   Report Status PENDING  Incomplete  CULTURE, BLOOD (ROUTINE X 2) w  Reflex to ID Panel     Status: None (Preliminary result)   Collection Time: 10/25/15 10:16 AM  Result Value Ref Range Status   Specimen Description BLOOD LEFT ASSIST CONTROL  Final   Special Requests BOTTLES DRAWN AEROBIC AND ANAEROBIC [place]  Final   Culture NO GROWTH 2 DAYS  Final   Report Status PENDING  Incomplete  Urine culture     Status: Abnormal   Collection Time: 10/25/15  6:32 PM  Result Value Ref Range Status   Specimen Description URINE, CLEAN CATCH  Final   Special Requests Normal  Final   Culture MULTIPLE SPECIES PRESENT, SUGGEST RECOLLECTION (A)  Final   Report Status 10/27/2015 FINAL  Final  Body fluid culture     Status: None (Preliminary result)   Collection Time: 10/26/15  4:32 PM  Result Value Ref Range Status   Specimen Description SYNOVIAL ELBOW  Final   Special Requests Normal  Final   Gram Stain   Final    ABUNDANT WBC PRESENT, PREDOMINANTLY PMN NO ORGANISMS SEEN    Culture   Final    NO GROWTH < 24 HOURS Performed at [facility]    Report Status PENDING  Incomplete    Coagulation Studies: No results for input(s): LABPROT, INR in the last 72 hours.  Urinalysis:  Recent Labs  10/25/15 1832  COLORURINE STRAW*  LABSPEC 1.008  PHURINE 7.0  GLUCOSEU 50*  HGBUR 1+*  BILIRUBINUR NEGATIVE  KETONESUR NEGATIVE  PROTEINUR 30*  NITRITE NEGATIVE  LEUKOCYTESUR NEGATIVE      Imaging: Dg Elbow Complete Right (3+view)  Result Date: 10/26/2015 CLINICAL DATA:  Right elbow pain, no injury, swelling. EXAM: RIGHT ELBOW - COMPLETE 3+ VIEW COMPARISON:  None. FINDINGS: No acute osseous abnormality. There may be a joint effusion. Soft tissue swelling is seen along the dorsal aspect of the elbow and forearm. IMPRESSION: 1. No acute osseous abnormality. 2. Suspected joint  effusion. 3. Dorsal soft tissue swelling. Electronically Signed   By: Lorin Picket M.D.   On: 10/26/2015 14:41     Medications:     . calcium carbonate  500 mg of elemental calcium  Oral BID WC  . cloNIDine  0.1 mg Oral BID  . diltiazem  120 mg Oral Daily  . docusate sodium  100 mg Oral BID  . folic acid  1 mg Oral Daily  . heparin  5,000 Units Intravenous Once  . metoprolol  200 mg Oral Daily  . multivitamin with minerals  1 tablet Oral Daily  . omega-3 acid ethyl esters  4 g Oral Daily  . pantoprazole (PROTONIX) IV  40 mg Intravenous Q12H  . piperacillin-tazobactam (ZOSYN)  IV  3.375 g Intravenous Q12H  . [START ON 10/28/2015] potassium chloride  40 mEq Oral Daily  . rosuvastatin  40 mg Oral QPM  . sodium bicarbonate  1,300 mg Oral BID  . sodium chloride flush  3 mL Intravenous Q12H  . tamsulosin  0.4 mg Oral Daily  . thiamine  100 mg Oral Daily  . vancomycin  1,000 mg Intravenous Q48H  . vitamin B-12  2,000 mcg Oral Daily   acetaminophen **OR** acetaminophen, morphine injection, ondansetron **OR** ondansetron (ZOFRAN) IV, oxyCODONE  Assessment/ Plan:  55 y.o. male originally from [place], history of alcohol abuse, hepatitis, peripheral vascular disease, hypertension, hyperlipidemia, BPH, admitted to [facility] on July 15 for acute renal failure   1. Acute Renal Failure: baseline creatinine of 1 on 06/07/15 2. Hyperkalemia, now hypokalemia 3. Metabolic acidosis 4. Anemia with renal failure: 2 units PRBC on 7/15  5. Hypocalcemia: calcium of 5.6 6. Acute pancreatitis 7.  Hyperlipidemia, severe 8. Fever - T max 102.8 right elbow septic joint  Serological studies have been negative so far.  Urine protein/creatine ratio is 0.6. Urine microscopic exam performed by Dr. Juleen China showed muddy brown cast suggesting ATN. Urine output is improving.  S Cr improved to 5.2 today without dialysis. Will continue to monitor renal function on a daily basis.   - Continue to monitor urine output and electrolytes closely. - Replace potassium as needed.  - agree with broadening be antibiotic coverage by adding vancomycin - Agree with orthopedic evaluation    LOS:  8 Alyanah Elliott 7/23/20171:37 PM

## 2015-10-28 ENCOUNTER — Other Ambulatory Visit: Payer: Self-pay | Admitting: Orthopedic Surgery

## 2015-10-28 ENCOUNTER — Inpatient Hospital Stay: Payer: 59

## 2015-10-28 LAB — CBC WITH DIFFERENTIAL/PLATELET
BASOS PCT: 1 %
Basophils Absolute: 0 10*3/uL (ref 0–0.1)
EOS ABS: 0.1 10*3/uL (ref 0–0.7)
EOS PCT: 2 %
HCT: 24.7 % — ABNORMAL LOW (ref 40.0–52.0)
Hemoglobin: 8.3 g/dL — ABNORMAL LOW (ref 13.0–18.0)
LYMPHS ABS: 0.9 10*3/uL — AB (ref 1.0–3.6)
Lymphocytes Relative: 12 %
MCH: 31.8 pg (ref 26.0–34.0)
MCHC: 33.7 g/dL (ref 32.0–36.0)
MCV: 94.6 fL (ref 80.0–100.0)
Monocytes Absolute: 1.1 10*3/uL — ABNORMAL HIGH (ref 0.2–1.0)
Monocytes Relative: 16 %
NEUTROS PCT: 69 %
Neutro Abs: 5 10*3/uL (ref 1.4–6.5)
PLATELETS: 159 10*3/uL (ref 150–440)
RBC: 2.61 MIL/uL — AB (ref 4.40–5.90)
RDW: 15.7 % — ABNORMAL HIGH (ref 11.5–14.5)
WBC: 7.2 10*3/uL (ref 3.8–10.6)

## 2015-10-28 LAB — BASIC METABOLIC PANEL
Anion gap: 12 (ref 5–15)
BUN: 33 mg/dL — AB (ref 6–20)
CALCIUM: 6.2 mg/dL — AB (ref 8.9–10.3)
CHLORIDE: 106 mmol/L (ref 101–111)
CO2: 20 mmol/L — ABNORMAL LOW (ref 22–32)
CREATININE: 4.16 mg/dL — AB (ref 0.61–1.24)
GFR calc non Af Amer: 15 mL/min — ABNORMAL LOW (ref >60)
GFR, EST AFRICAN AMERICAN: 17 mL/min — AB (ref >60)
Glucose, Bld: 102 mg/dL — ABNORMAL HIGH (ref 65–99)
Potassium: 3.3 mmol/L — ABNORMAL LOW (ref 3.5–5.1)
SODIUM: 138 mmol/L (ref 135–145)

## 2015-10-28 LAB — VANCOMYCIN, RANDOM: VANCOMYCIN RM: 12

## 2015-10-28 MED ORDER — VANCOMYCIN HCL IN DEXTROSE 1-5 GM/200ML-% IV SOLN
1000.0000 mg | Freq: Once | INTRAVENOUS | Status: DC
  Filled 2015-10-28: qty 200

## 2015-10-28 MED ORDER — VANCOMYCIN HCL 10 G IV SOLR
1250.0000 mg | Freq: Once | INTRAVENOUS | Status: AC
  Administered 2015-10-28: 1250 mg via INTRAVENOUS
  Filled 2015-10-28: qty 1250

## 2015-10-28 MED ORDER — DEXTROSE 5 % IV SOLN
1.0000 g | Freq: Two times a day (BID) | INTRAVENOUS | Status: DC
  Administered 2015-10-28 (×2): 1 g via INTRAVENOUS
  Filled 2015-10-28 (×4): qty 1

## 2015-10-28 NOTE — Progress Notes (Signed)
[facility] accepted pt but under wait- list

## 2015-10-28 NOTE — Progress Notes (Signed)
[facility] [place] NAME: [name] [name]    MR#:  [id number]  DATE OF BIRTH:  Sep 03, 1960  SUBJECTIVE:  CHIEF COMPLAINT:   Chief Complaint  Patient presents with  . Abnormal Lab  Had EGD earlier which showed portal hypertensive gastropathy and some gastritis, has good urine output so dialysis on hold Today -Patient is still febrile , c/o rt elbow pain and rt foot redness,Joint fluid extracted by orthopedics 7/22 ,his dialysis catheter and central line was removed 10/24/2015 for low-grade fevers.  REVIEW OF SYSTEMS:  Review of Systems  Constitutional: Positive for malaise/fatigue. Negative for diaphoresis, fever and weight loss.  HENT: Negative for ear discharge, ear pain, hearing loss, nosebleeds, sore throat and tinnitus.   Eyes: Negative for blurred vision and pain.  Respiratory: Negative for cough, hemoptysis, shortness of breath and wheezing.   Cardiovascular: Negative for chest pain, palpitations, orthopnea and leg swelling.  Gastrointestinal: Negative for abdominal pain, blood in stool, constipation, diarrhea, heartburn, nausea and vomiting.  Genitourinary: Negative for dysuria, frequency and urgency.  Musculoskeletal: Positive for joint pain. Negative for back pain and myalgias.  Skin: Negative for itching and rash.  Neurological: Positive for weakness. Negative for dizziness, tingling, tremors, focal weakness, seizures and headaches.  Psychiatric/Behavioral: Negative for depression. The patient is not nervous/anxious.    DRUG ALLERGIES:   Allergies  Allergen Reactions  . Fenofibrate Other (See Comments)   VITALS:  Blood pressure 114/65, pulse 95, temperature (!) 101.6 F (38.7 C), temperature source Oral, resp. rate 18, height 6' (1.829 m), weight 84.1 kg (185 lb 6.4 oz), SpO2 97 %. PHYSICAL EXAMINATION:  Physical Exam  Constitutional: He is oriented to person, place, and time and well-developed, well-nourished, and in no  distress.  HENT:  Head: Normocephalic and atraumatic.  Eyes: Conjunctivae and EOM are normal. Pupils are equal, round, and reactive to light.  Neck: Normal range of motion. Neck supple. No tracheal deviation present. No thyromegaly present.  Cardiovascular: Normal rate, regular rhythm and normal heart sounds.   Pulmonary/Chest: Effort normal and breath sounds normal. No respiratory distress. He has no wheezes. He exhibits no tenderness.  Abdominal: Soft. Bowel sounds are normal. He exhibits no distension. There is no tenderness.  Musculoskeletal: Normal range of motion.  Right elbow joint is tender and decreased range of motion status post arthrocentesis  Neurological: He is alert and oriented to person, place, and time. No cranial nerve deficit.  Skin: Skin is warm and dry. No rash noted.  Psychiatric: Mood and affect normal.   LABORATORY PANEL:   CBC  Recent Labs Lab 10/28/15 0916  WBC 7.2  HGB 8.3*  HCT 24.7*  PLT 159   ------------------------------------------------------------------------------------------------------------------ Chemistries   Recent Labs Lab 10/27/15 0623  10/28/15 0916  NA 140  --  138  K 2.9*  < > 3.3*  CL 105  --  106  CO2 23  --  20*  GLUCOSE 105*  --  102*  BUN 40*  --  33*  CREATININE 5.23*  --  4.16*  CALCIUM 5.6*  --  6.2*  MG 1.8  --   --   < > = values in this interval not displayed. RADIOLOGY:  Mr Elbow Right Wo Contrast  Result Date: 10/27/2015 CLINICAL DATA:  Right elbow pain for 3 days.  No known injury. EXAM: MRI OF THE RIGHT ELBOW WITHOUT CONTRAST TECHNIQUE: Multiplanar, multisequence MR imaging of the elbow was performed. No intravenous contrast was administered. COMPARISON:  Plain films right elbow 10/26/2015. FINDINGS: Patient motion degrades examination. Subcutaneous edema is seen about the elbow and is worse medially. Edema is also identified in the brachialis muscle eccentric worse toward the medial side. No tear is seen.  TENDONS Common forearm flexor origin: Mild edema is seen at the common flexor origin. No tendon tear. Common forearm extensor origin: Edema is seen in the common extensor origin. No frank tendon tear. Biceps: Intact. Triceps: Intact. LIGAMENTS Medial stabilizers: Intact. Lateral stabilizers:  Intact. Cartilage: Unremarkable. Joint: Large elbow joint effusion is seen. Cubital tunnel: Unremarkable. Bones: No fracture or focal bony lesion is identified. Marrow signal is normal. IMPRESSION: Large elbow joint effusion. Cause for the effusion is not identified. Edema in the brachialis muscle eccentrically worse on the medial side without tear is nonspecific but could be due to strain or infectious or inflammatory myositis. Findings consistent with medial and lateral epicondylitis without tendon tear. Changes appear worse on the lateral side. Electronically Signed   By: Inge Rise M.D.   On: 10/27/2015 18:19  Dg Foot Complete Right  Result Date: 10/28/2015 CLINICAL DATA:  Redness and swelling of the right foot. History of gout and peripheral vascular disease. EXAM: RIGHT FOOT COMPLETE - 3+ VIEW COMPARISON:  None. FINDINGS: There is no evidence of fracture or dislocation. There is no evidence of arthropathy or other focal bone abnormality. No bony erosions are seen. Soft tissues demonstrate mid from dorsal swelling. IMPRESSION: No acute fracture or dislocation identified about the right foot. No radiographic evidence of osteomyelitis or stigmata of gouty arthritis. Electronically Signed   By: Fidela Salisbury M.D.   On: 10/28/2015 13:49  ASSESSMENT AND PLAN:  This is a 55 year old male admitted for acute renal failure and pancreatitis.  #Sepsis from septic joint-right elbow Pancultures were obtained 7/21 MRI of the right elbow with large joint effusion. Appreciate orthopedics recommendations Blood cultures are negative  Body fluid cultures from the right elbow with no growth so far Urine culture is  with multiple species which is contaminated Chest x-rays negative  dialysis catheter from the groin and central line were removed on July 20 patient on empiric IV antibiotics zosyn and vanc. Zosyn was discontinued and cefepime was initiated according to the infectious disease Dr. Algis Downs [phone] recommendations Patient is currently nothing by mouth for possible surgery but unfortunately [facility] operating rooms are shut down for technical reasons and orthopedics Dr. Mack Guise is recommended to transfer the patient for joint wash as soon as possible. [facility] hand surgeon does not do elbow surgeries. Call placed to [facility] transfer center by me and call placed to [facility] by Dr. Mack Guise regarding transfer and awaiting call back  #Right foot cellulitis Continue vancomycin. Uric acid level is normal given history of gout X-ray of the right foot with no osteomyelitis are gouty arthritis Started patient on cefepime and continue vancomycin. Discontinue Zosyn as per infectious disease Dr. Algis Downs recommendations  # Anemia -  EGD 10/23/2015  showed gastritis and mild portal hypertensive gastropathyportal gasropathy with renal component?  HB 6.9- PRBC 1 unit , repeat hemoglobin 8.5-8.3 FOBT neg Hold asa and plavix  #. Acute renal failure: 2/2 ATN-patient's baseline creatinine is 1 on 06/07/2015 -Creatinine 9.09-8.14-7.31-5.23- 4.16  -Status post removal of the hemodialysis catheter as patient developed low-grade temperature - Most of the lab work so far has been negative - Urine microscopic exam showed muddy brown casts suggestive of ATN - Dialysis per nephrology, has good urine output so dialysis on hold for now.   #.  Pancreatitis: amylase and lipase coming down, now resolved.  Could be due to hypertriglyceridemia. Avoid oral calcium supplements   #hypokalemia and hypomagnesemia  replete potassium and mag recheck in a.m. including magnesium  #. Essential hypertension: Stop lisinopril -  hydrochlorothiazide. Continue metoprolol and diltiazem. Clonidine dose increased to 0.2 mg by mouth twice a day  #. Hypercholesterolemia: Continue crestor  #. BPH: Continue Flomax  #.  Multiple kidney stones: Nonobstructive, can follow up with urology as an outpatient  Physical therapy has recommended outpatient PT   GI prophylaxis: As above DVT prophylaxis: SCD  as the patient has an active GI bleed   Discontinue telemetry   All the records are reviewed and case discussed with Care Management/Social Worker. Management plans discussed with the patient and nursing.  Discussed with Dr. Candiss Norse  CODE STATUS: FULL [place] THIS PATIENT: 35 minutes.   More than 50% of the time was spent in counseling/coordination of care: YES  POSSIBLE D/C IN AM, DEPENDING ON CLINICAL CONDITION. AND KIDNEY FUNCTION   Nicholes Mango M.D on 10/28/2015 at 3:08 PM  Between 7am to 6pm - Pager - [phone]  After 6pm go to [email or web address] - Proofreader  [employer] [facility]  Office  [phone]  CC: Primary care physician; Kirk Ruths., MD  Note: This dictation was prepared with Dragon dictation along with smaller phrase technology. Any transcriptional errors that result from this process are unintentional.

## 2015-10-28 NOTE — Progress Notes (Signed)
Pharmacy Antibiotic Note  [name] [name] is a 55 y.o. male admitted on 10/19/2015 with sepsis/septic right elbow joint/ leg cellulitis .  Pharmacy has been consulted for vancomycin dosing. (Patient not on HD at home- had emergent HD here. Temp.HD cath was removed 10/24/15)  Plan: Will plan for vancomycin 1000 mg IV q 48 hours. Goal trough 15-20 mcg/mL. Due to patients renal function and not on current dialysis, will get random vanc level in 24 hours and adjust dose accordingly. Will monitor for initiation dialysis and renal function.  7/23:  Vanc random level was 10 at 1050 (not reported until 1714 due to lab instrument being down). Will give another dose of Vancomycin 1gm IV x1 with a plan for Vancomycin 1gm IV every 48 hours.  Ordered another Vanc random for 7/24 at 1800 (this is 24 hrs after last dose).  *?possible transfer to [facility] at [facility] closed.     Height: 6' (182.9 cm) Weight: 185 lb 6.4 oz (84.1 kg) IBW/kg (Calculated) : 77.6  Temp (24hrs), Avg:100 F (37.8 C), Min:99.4 F (37.4 C), Max:100.5 F (38.1 C)   Recent Labs Lab 10/22/15 0607 10/23/15 0420  10/24/15 0359 10/25/15 0512 10/26/15 0507 10/27/15 0623 10/27/15 1050 10/28/15 0916  WBC 6.1 8.7  --  12.0*  --  11.7*  --   --  7.2  CREATININE 8.39* 9.15*  < > 8.14*  8.35* 7.31* 6.72* 5.23*  --  4.16*  VANCORANDOM  --   --   --   --   --   --   --  10  --   < > = values in this interval not displayed.  Estimated Creatinine Clearance: 21.8 mL/min (by C-G formula based on SCr of 4.16 mg/dL).    Allergies  Allergen Reactions  . Fenofibrate Other (See Comments)    Antimicrobials this admission: Levofloxacin 07/21 >> 07/22 Piperacillin-tazobactam 07/21 >> 7/24 Cefepime 7/24 >> Vancomycin 07/22 >>  Microbiology results: 07/21 BCx: NGTD 07/21 UCx: multiple species  Thank you for allowing pharmacy to be a part of this patient's care.  Chinita Greenland PharmD Clinical  Pharmacist 10/28/2015 2:05 PM

## 2015-10-28 NOTE — Progress Notes (Addendum)
Pharmacy Antibiotic Note  [name] [name] is a 55 y.o. male admitted on 10/19/2015 with sepsis/septic right elbow joint/ leg cellulitis .  Pharmacy has been consulted for vancomycin dosing. (Patient not on HD at home- had emergent HD here. Temp.HD cath was removed 10/24/15)  Plan: Will plan for vancomycin 1000 mg IV q 48 hours. Goal trough 15-20 mcg/mL. Due to patients renal function and not on current dialysis, will get random vanc level in 24 hours and adjust dose accordingly. Will monitor for initiation dialysis and renal function.  7/23:  Vanc random level was 10 at 1050 (not reported until 1714 due to lab instrument being down). Will give another dose of Vancomycin 1gm IV x1 with a plan for Vancomycin 1gm IV every 48 hours.  Ordered another Vanc random for 7/24 at 1800 (this is 24 hrs after last dose).  *?possible transfer to [facility] at [facility] closed.  7/24: Random level resulted @ 12 mcg/ml. Will redose @ Vancomycin 1250 mg IV x 1. Will recheck random level 24 hours post dose if level within goal range again and renal function continues to improve, would initiate Vancomycin 1g IV q24 hours and follow patient clinically.  Random level ordered for 20:00.      Height: 6' (182.9 cm) Weight: 185 lb 6.4 oz (84.1 kg) IBW/kg (Calculated) : 77.6  Temp (24hrs), Avg:100.5 F (38.1 C), Min:99.4 F (37.4 C), Max:101.6 F (38.7 C)   Recent Labs Lab 10/22/15 0607 10/23/15 0420  10/24/15 0359 10/25/15 0512 10/26/15 0507 10/27/15 0623 10/27/15 1050 10/28/15 0916 10/28/15 1806  WBC 6.1 8.7  --  12.0*  --  11.7*  --   --  7.2  --   CREATININE 8.39* 9.15*  < > 8.14*  8.35* 7.31* 6.72* 5.23*  --  4.16*  --   VANCORANDOM  --   --   --   --   --   --   --  10  --  12  < > = values in this interval not displayed.  Estimated Creatinine Clearance: 21.8 mL/min (by C-G formula based on SCr of 4.16 mg/dL).    Allergies  Allergen Reactions  . Fenofibrate Other (See Comments)     Antimicrobials this admission: Levofloxacin 07/21 >> 07/22 Piperacillin-tazobactam 07/21 >> 7/24 Cefepime 7/24 >> Vancomycin 07/22 >>  Microbiology results: 07/21 BCx: NGTD 07/21 UCx: multiple species  Thank you for allowing pharmacy to be a part of this patient's care.  Larene Beach, PharmD  Clinical Pharmacist 10/28/2015 7:14 PM

## 2015-10-28 NOTE — Care Management Note (Signed)
Case Management Note  Patient Details  Name: ISLAM DELLACROCE MRN: [id number] Date of Birth: [birth year]  Subjective/Objective:      Per Dr Margaretmary Eddy and Dr Mack Guise, a transfer to [facility] is being initiated per no hand surgeon available at [facility]. Charge nurse is aware.               Action/Plan:   Expected Discharge Date:                  Expected Discharge Plan:     In-House Referral:     Discharge planning Services     Post Acute Care Choice:    Choice offered to:     DME Arranged:    DME Agency:     HH Arranged:    HH Agency:     Status of Service:     If discussed at [employer] of Stay Meetings, dates discussed:    Additional Comments:  Jazion Atteberry A, RN 10/28/2015, 11:00 AM

## 2015-10-28 NOTE — Progress Notes (Signed)
Dr. Margaretmary Eddy notified of temp of 101.6.  Tylenol given. Will continue to monitor.
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
## 2015-10-28 NOTE — Progress Notes (Signed)
Dr Margaretmary Eddy notified of critical Ca level of 6.2
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
## 2015-10-28 NOTE — Progress Notes (Signed)
Subjective:  Creatinine down to 4.16. Potassium still slightly low at 3.3. Urine output was 1.6 L over the preceding 24 hours.   Objective:  Vital signs in last 24 hours:  Temp:  [99.4 F (37.4 C)-100.5 F (38.1 C)] 99.4 F (37.4 C) (07/24 0509) Pulse Rate:  [85-98] 98 (07/24 1200) Resp:  [18-20] 20 (07/24 1200) BP: (96-123)/(59-66) 123/65 (07/24 1200) SpO2:  [94 %-97 %] 97 % (07/24 0509)  Weight change:  Filed Weights   10/25/15 0500 10/26/15 0507 10/27/15 0500  Weight: 86.4 kg (190 lb 6.4 oz) 86.3 kg (190 lb 3.2 oz) 84.1 kg (185 lb 6.4 oz)    Intake/Output:    Intake/Output Summary (Last 24 hours) at 10/28/15 1302 Last data filed at 10/28/15 0800  Gross per 24 hour  Intake              530 ml  Output             1800 ml  Net            -1270 ml     Physical Exam: General: No acute distress  HEENT anicteric, moist mucous membranes  Neck supple  Pulm/lungs Normal breathing effort, clear to auscultation bilaterally  CVS/Heart Regular, no rub or gallop  Abdomen:  Soft, nontender  Extremities:  no peripheral edema, right elbow swelling noted  Neurologic: Alert, oriented, follows commands  Skin: no acute rashes          Basic Metabolic Panel:   Recent Labs Lab 10/23/15 0420  10/24/15 0359 10/25/15 0512 10/26/15 0507 10/26/15 0958 10/26/15 1507 10/26/15 1747 10/27/15 0623 10/27/15 1452 10/28/15 0916  NA 143  < > 141  141 138 138  --   --   --  140  --  138  K 3.0*  < > 3.0*  2.9* 2.9* 2.9* 2.7*  --  2.8* 2.9* 3.6 3.3*  CL 107  < > 106  106 105 104  --   --   --  105  --  106  CO2 28  < > 24  24 23 22   --   --   --  23  --  20*  GLUCOSE 117*  < > 116*  116* 128* 97  --   --   --  105*  --  102*  BUN 47*  < > 43*  43* 45* 51*  --   --   --  40*  --  33*  CREATININE 9.15*  < > 8.14*  8.35* 7.31* 6.72*  --   --   --  5.23*  --  4.16*  CALCIUM 6.8*  < > 6.4*  6.4* 5.8* 5.4*  --   --   --  5.6*  --  6.2*  MG  --   --   --   --  0.9* 1.5* 2.0 1.9  1.8  --   --   PHOS 5.3*  --  3.1  --   --   --   --   --   --   --   --   < > = values in this interval not displayed.   CBC:  Recent Labs Lab 10/22/15 0607 10/23/15 0420 10/24/15 0359 10/26/15 0507 10/26/15 1507 10/28/15 0916  WBC 6.1 8.7 12.0* 11.7*  --  7.2  NEUTROABS  --   --   --  9.0*  --  5.0  HGB 7.8* 7.5* 7.7* 6.7* 8.5* 8.3*  HCT 22.3* 21.9*  23.0* 20.1* 24.5* 24.7*  MCV 94.0 95.9 94.1 95.6  --  94.6  PLT 143* 150 172 138*  --  159      Microbiology:  Recent Results (from the past 720 hour(s))  Giardia/Cryptosporidium EIA     Status: None   Collection Time: 10/22/15  1:40 AM  Result Value Ref Range Status   Giardia Ag, Stl Negative Negative Final    Comment: (NOTE) Performed At: [facility] [address] [place], [place] [id number] Lindon Romp MD [id number]    Cryptosporidium EIA Negative Negative Final    Comment: (NOTE) Performed At: [facility] [place], [place] [id number] Lindon Romp MD [id number]   CULTURE, BLOOD (ROUTINE X 2) w Reflex to ID Panel     Status: None (Preliminary result)   Collection Time: 10/25/15 10:15 AM  Result Value Ref Range Status   Specimen Description BLOOD LEFT HAND  Final   Special Requests   Final    BOTTLES DRAWN AEROBIC AND ANAEROBIC  AEROBIC 3CC, ANAEROBIC 1CC   Culture NO GROWTH 3 DAYS  Final   Report Status PENDING  Incomplete  CULTURE, BLOOD (ROUTINE X 2) w Reflex to ID Panel     Status: None (Preliminary result)   Collection Time: 10/25/15 10:16 AM  Result Value Ref Range Status   Specimen Description BLOOD LEFT ASSIST CONTROL  Final   Special Requests BOTTLES DRAWN AEROBIC AND ANAEROBIC [place]  Final   Culture NO GROWTH 3 DAYS  Final   Report Status PENDING  Incomplete  Urine culture     Status: Abnormal   Collection Time: 10/25/15  6:32 PM  Result Value Ref Range Status   Specimen Description URINE, CLEAN CATCH  Final   Special Requests Normal  Final   Culture  MULTIPLE SPECIES PRESENT, SUGGEST RECOLLECTION (A)  Final   Report Status 10/27/2015 FINAL  Final  Body fluid culture     Status: None (Preliminary result)   Collection Time: 10/26/15  4:32 PM  Result Value Ref Range Status   Specimen Description SYNOVIAL ELBOW  Final   Special Requests Normal  Final   Gram Stain   Final    ABUNDANT WBC PRESENT, PREDOMINANTLY PMN NO ORGANISMS SEEN    Culture   Final    NO GROWTH 2 DAYS Performed at [facility]    Report Status PENDING  Incomplete    Coagulation Studies: No results for input(s): LABPROT, INR in the last 72 hours.  Urinalysis:  Recent Labs  10/25/15 1832  COLORURINE STRAW*  LABSPEC 1.008  PHURINE 7.0  GLUCOSEU 50*  HGBUR 1+*  BILIRUBINUR NEGATIVE  KETONESUR NEGATIVE  PROTEINUR 30*  NITRITE NEGATIVE  LEUKOCYTESUR NEGATIVE      Imaging: Dg Elbow Complete Right (3+view)  Result Date: 10/26/2015 CLINICAL DATA:  Right elbow pain, no injury, swelling. EXAM: RIGHT ELBOW - COMPLETE 3+ VIEW COMPARISON:  None. FINDINGS: No acute osseous abnormality. There may be a joint effusion. Soft tissue swelling is seen along the dorsal aspect of the elbow and forearm. IMPRESSION: 1. No acute osseous abnormality. 2. Suspected joint effusion. 3. Dorsal soft tissue swelling. Electronically Signed   By: Lorin Picket M.D.   On: 10/26/2015 14:41   Mr Elbow Right Wo Contrast  Result Date: 10/27/2015 CLINICAL DATA:  Right elbow pain for 3 days.  No known injury. EXAM: MRI OF THE RIGHT ELBOW WITHOUT CONTRAST TECHNIQUE: Multiplanar, multisequence MR imaging of the elbow was performed. No intravenous contrast was  administered. COMPARISON:  Plain films right elbow 10/26/2015. FINDINGS: Patient motion degrades examination. Subcutaneous edema is seen about the elbow and is worse medially. Edema is also identified in the brachialis muscle eccentric worse toward the medial side. No tear is seen. TENDONS Common forearm flexor origin: Mild edema is  seen at the common flexor origin. No tendon tear. Common forearm extensor origin: Edema is seen in the common extensor origin. No frank tendon tear. Biceps: Intact. Triceps: Intact. LIGAMENTS Medial stabilizers: Intact. Lateral stabilizers:  Intact. Cartilage: Unremarkable. Joint: Large elbow joint effusion is seen. Cubital tunnel: Unremarkable. Bones: No fracture or focal bony lesion is identified. Marrow signal is normal. IMPRESSION: Large elbow joint effusion. Cause for the effusion is not identified. Edema in the brachialis muscle eccentrically worse on the medial side without tear is nonspecific but could be due to strain or infectious or inflammatory myositis. Findings consistent with medial and lateral epicondylitis without tendon tear. Changes appear worse on the lateral side. Electronically Signed   By: Inge Rise M.D.   On: 10/27/2015 18:19    Medications:     . calcium carbonate  500 mg of elemental calcium Oral BID WC  . ceFEPime (MAXIPIME) IV  1 g Intravenous Q12H  . cloNIDine  0.1 mg Oral BID  . diltiazem  120 mg Oral Daily  . docusate sodium  100 mg Oral BID  . folic acid  1 mg Oral Daily  . heparin  5,000 Units Intravenous Once  . metoprolol  200 mg Oral Daily  . multivitamin with minerals  1 tablet Oral Daily  . omega-3 acid ethyl esters  4 g Oral Daily  . pantoprazole (PROTONIX) IV  40 mg Intravenous Q12H  . potassium chloride  40 mEq Oral Daily  . rosuvastatin  40 mg Oral QPM  . sodium bicarbonate  1,300 mg Oral BID  . sodium chloride flush  3 mL Intravenous Q12H  . tamsulosin  0.4 mg Oral Daily  . thiamine  100 mg Oral Daily  . vancomycin  1,000 mg Intravenous Q48H  . vitamin B-12  2,000 mcg Oral Daily   acetaminophen **OR** acetaminophen, morphine injection, ondansetron **OR** ondansetron (ZOFRAN) IV, oxyCODONE  Assessment/ Plan:  55 y.o. male originally from [place], history of alcohol abuse, hepatitis, peripheral vascular disease,  hypertension, hyperlipidemia, BPH, admitted to [facility] on July 15 for acute renal failure   1. Acute Renal Failure: baseline creatinine of 1 on 06/07/15 2. Hyperkalemia, now hypokalemia 3. Metabolic acidosis 4. Anemia with renal failure: 2 units PRBC on 7/15  5. Hypocalcemia: calcium of 6.2 6. Acute pancreatitis 7.  Hyperlipidemia, severe 8. Fever - T max 102.8 right elbow septic joint  Serological studies have been negative so far.  Urine protein/creatine ratio is 0.6. Urine microscopic exam performed by Dr. Juleen China showed muddy brown cast suggesting ATN.   Plan:  Renal function continues to be improving slowly.  BUN is down to 33 and creatinine is currently 4.1.  Hypocalcemia also appears to be correcting and calcium is now up to 6.2.  Serum potassium still intermittently low and today potassium was 3.3.  Continue periodic potassium supplementation.  Management of right elbow septic arthritis per orthopedics.  Continue vancomycin.  We will continue to monitor along with you.   LOS: 9 Nilaya Bouie 7/24/20171:02 PM

## 2015-10-29 ENCOUNTER — Inpatient Hospital Stay: Payer: 59

## 2015-10-29 LAB — SYNOVIAL CELL COUNT + DIFF, W/ CRYSTALS
EOSINOPHILS-SYNOVIAL: 0 %
Lymphocytes-Synovial Fld: 10 %
MONOCYTE-MACROPHAGE-SYNOVIAL FLUID: 9 %
Neutrophil, Synovial: 81 %
Other Cells-SYN: 0
WBC, Synovial: 19204 /mm3 — ABNORMAL HIGH (ref 0–200)

## 2015-10-29 LAB — CBC
HEMATOCRIT: 25.9 % — AB (ref 40.0–52.0)
Hemoglobin: 8.9 g/dL — ABNORMAL LOW (ref 13.0–18.0)
MCH: 32.4 pg (ref 26.0–34.0)
MCHC: 34.2 g/dL (ref 32.0–36.0)
MCV: 94.9 fL (ref 80.0–100.0)
PLATELETS: 159 10*3/uL (ref 150–440)
RBC: 2.73 MIL/uL — ABNORMAL LOW (ref 4.40–5.90)
RDW: 15.9 % — AB (ref 11.5–14.5)
WBC: 6.6 10*3/uL (ref 3.8–10.6)

## 2015-10-29 LAB — BASIC METABOLIC PANEL
ANION GAP: 11 (ref 5–15)
BUN: 29 mg/dL — AB (ref 6–20)
CALCIUM: 6 mg/dL — AB (ref 8.9–10.3)
CO2: 22 mmol/L (ref 22–32)
Chloride: 105 mmol/L (ref 101–111)
Creatinine, Ser: 3.34 mg/dL — ABNORMAL HIGH (ref 0.61–1.24)
GFR calc Af Amer: 22 mL/min — ABNORMAL LOW (ref >60)
GFR calc non Af Amer: 19 mL/min — ABNORMAL LOW (ref >60)
GLUCOSE: 100 mg/dL — AB (ref 65–99)
POTASSIUM: 3.4 mmol/L — AB (ref 3.5–5.1)
Sodium: 138 mmol/L (ref 135–145)

## 2015-10-29 LAB — CREATININE, SERUM
Creatinine, Ser: 2.82 mg/dL — ABNORMAL HIGH (ref 0.61–1.24)
GFR, EST AFRICAN AMERICAN: 27 mL/min — AB (ref >60)
GFR, EST NON AFRICAN AMERICAN: 23 mL/min — AB (ref >60)

## 2015-10-29 LAB — BODY FLUID CULTURE
Culture: NO GROWTH
SPECIAL REQUESTS: NORMAL

## 2015-10-29 LAB — MAGNESIUM: Magnesium: 1.3 mg/dL — ABNORMAL LOW (ref 1.7–2.4)

## 2015-10-29 LAB — FECAL LACTOFERRIN, QUANT: LACTOFERRIN, FECAL, QUANT.: 7.79 ug/mL — AB (ref 0.00–7.24)

## 2015-10-29 LAB — VANCOMYCIN, TROUGH: Vancomycin Tr: 18 ug/mL (ref 15–20)

## 2015-10-29 MED ORDER — PREDNISONE 10 MG PO TABS
50.0000 mg | ORAL_TABLET | Freq: Every day | ORAL | Status: DC
  Administered 2015-10-29: 50 mg via ORAL
  Filled 2015-10-29: qty 2

## 2015-10-29 MED ORDER — DEXTROSE 5 % IV SOLN
1.0000 g | INTRAVENOUS | Status: DC
  Filled 2015-10-29: qty 1

## 2015-10-29 MED ORDER — MAGNESIUM SULFATE 2 GM/50ML IV SOLN
2.0000 g | Freq: Once | INTRAVENOUS | Status: AC
  Administered 2015-10-29: 2 g via INTRAVENOUS
  Filled 2015-10-29: qty 50

## 2015-10-29 MED ORDER — DEXTROSE 5 % IV SOLN
2.0000 g | INTRAVENOUS | Status: DC
  Administered 2015-10-29: 2 g via INTRAVENOUS
  Filled 2015-10-29 (×2): qty 2

## 2015-10-29 MED ORDER — VANCOMYCIN HCL IN DEXTROSE 1-5 GM/200ML-% IV SOLN
1000.0000 mg | INTRAVENOUS | Status: DC
  Administered 2015-10-29: 1000 mg via INTRAVENOUS
  Filled 2015-10-29 (×2): qty 200

## 2015-10-29 NOTE — Care Management Note (Deleted)
Case Management Note  Patient Details  Name: [name] [name] MRN: [id number] Date of Birth: 05/11/1960  Subjective/Objective:                    Action/Plan:   Expected Discharge Date:                  Expected Discharge Plan:     In-House Referral:     Discharge planning Services     Post Acute Care Choice:    Choice offered to:     DME Arranged:    DME Agency:     HH Arranged:    HH Agency:     Status of Service:     If discussed at [employer] of Stay Meetings, dates discussed:    Additional Comments:  Andron Marrazzo A, RN 10/29/2015, 4:25 PM

## 2015-10-29 NOTE — Progress Notes (Addendum)
[place] [facility] [place] NAME: Jahem Deleeuw    MR#:  [id number]  DATE OF BIRTH:  Aug 31, 1960  SUBJECTIVE:  CHIEF COMPLAINT:   Chief Complaint  Patient presents with  . Abnormal Lab  Had EGD earlier which showed portal hypertensive gastropathy and some gastritis, has good urine output so dialysis on hold Today - still febrile , awaiting  rt elbow surgery and still has pain and rt foot redness  Is bette; rJoint fluid extracted by orthopedics 7/22 ,his dialysis catheter and central line was removed 10/24/2015 for low-grade fevers.  REVIEW OF SYSTEMS:  Review of Systems  Constitutional: Positive for malaise/fatigue. Negative for diaphoresis, fever and weight loss.  HENT: Negative for ear discharge, ear pain, hearing loss, nosebleeds, sore throat and tinnitus.   Eyes: Negative for blurred vision and pain.  Respiratory: Negative for cough, hemoptysis, shortness of breath and wheezing.   Cardiovascular: Negative for chest pain, palpitations, orthopnea and leg swelling.  Gastrointestinal: Negative for abdominal pain, blood in stool, constipation, diarrhea, heartburn, nausea and vomiting.  Genitourinary: Negative for dysuria, frequency and urgency.  Musculoskeletal: Positive for joint pain. Negative for back pain and myalgias.  Skin: Negative for itching and rash.  Neurological: Positive for weakness. Negative for dizziness, tingling, tremors, focal weakness, seizures and headaches.  Psychiatric/Behavioral: Negative for depression. The patient is not nervous/anxious.    DRUG ALLERGIES:   Allergies  Allergen Reactions  . Fenofibrate Other (See Comments)   VITALS:  Blood pressure 128/73, pulse (!) 55, temperature (!) 101.3 F (38.5 C), temperature source Oral, resp. rate 17, height 6' (1.829 m), weight 83.4 kg (183 lb 13 oz), SpO2 91 %. PHYSICAL EXAMINATION:  Physical Exam  Constitutional: He is oriented to person, place, and time and  well-developed, well-nourished, and in no distress.  HENT:  Head: Normocephalic and atraumatic.  Eyes: Conjunctivae and EOM are normal. Pupils are equal, round, and reactive to light.  Neck: Normal range of motion. Neck supple. No tracheal deviation present. No thyromegaly present.  Cardiovascular: Normal rate, regular rhythm and normal heart sounds.   Pulmonary/Chest: Effort normal and breath sounds normal. No respiratory distress. He has no wheezes. He exhibits no tenderness.  Abdominal: Soft. Bowel sounds are normal. He exhibits no distension. There is no tenderness.  Musculoskeletal: Normal range of motion.  Right elbow joint is tender and decreased range of motion status post arthrocentesis  Neurological: He is alert and oriented to person, place, and time. No cranial nerve deficit.  Skin: Skin is warm and dry. No rash noted.  Psychiatric: Mood and affect normal.   LABORATORY PANEL:   CBC  Recent Labs Lab 10/29/15 0451  WBC 6.6  HGB 8.9*  HCT 25.9*  PLT 159   ------------------------------------------------------------------------------------------------------------------ Chemistries   Recent Labs Lab 10/29/15 0451  NA 138  K 3.4*  CL 105  CO2 22  GLUCOSE 100*  BUN 29*  CREATININE 3.34*  CALCIUM 6.0*  MG 1.3*   RADIOLOGY:  Mr Foot Right Wo Contrast  Result Date: 10/29/2015 CLINICAL DATA:  Right foot edema and erythema along the dorsal aspect extending into the right ankle. EXAM: MRI OF THE RIGHT FOREFOOT WITHOUT CONTRAST TECHNIQUE: Multiplanar, multisequence MR imaging was performed. No intravenous contrast was administered. COMPARISON:  None. FINDINGS: TENDONS Peroneal: Peroneal longus tendon intact. Longitudinal split tear of the proximal peroneus brevis tendon. Mild peroneal tenosynovitis. Posteromedial: Posterior tibial tendon intact. Flexor hallucis longus tendon intact. Flexor digitorum longus tendon intact. Anterior: Tibialis anterior  tendon intact. Extensor  hallucis longus tendon intact Extensor digitorum longus tendon intact. Achilles:  Intact. Plantar Fascia: Intact. LIGAMENTS Lateral: Anterior talofibular ligament intact. Posterior talofibular ligament intact. Anterior and posterior tibiofibular ligaments intact. Medial: Deltoid ligament intact. Spring ligament intact. CARTILAGE Ankle Joint: No joint effusion. Normal ankle mortise. No chondral defect. Subtalar Joints/Sinus Tarsi: Normal subtalar joints. No subtalar joint effusion. Normal sinus tarsi. Bones: No marrow signal abnormality.  No fracture or dislocation. Soft Tissue: Severe soft tissue edema in the subcutaneous fat along the dorsal aspect of the forefoot, midfoot and extending into the ankle most concerning for severe cellulitis. No focal fluid collection or hematoma. IMPRESSION: 1. Severe soft tissue edema in the subcutaneous fat along the dorsal aspect of the forefoot, midfoot and extending into the ankle most concerning for severe cellulitis. No focal fluid collection or hematoma. 2. No osteomyelitis of the right foot. Electronically Signed   By: Kathreen Devoid   On: 10/29/2015 11:08  ASSESSMENT AND PLAN:  This is a 55 year old male admitted for acute renal failure and pancreatitis.  #Sepsis from septic joint-right elbow Awaiting surgery today in the late afternoon after OR is reopened NPO from last night MRI of the right elbow with large joint effusion. Appreciate orthopedics recommendations Blood cultures are negative  Body fluid cultures from the right elbow with no growth so far Urine culture is with multiple species which is contaminated Chest x-rays negative  dialysis catheter from the groin and central line were removed on July 20 patient was on empiric IV antibiotics zosyn and vanc. Zosyn was discontinued and cefepime was initiated according to the infectious disease Dr. Algis Downs [phone] recommendations Patient is currently nothing by mouth for possible surgery [facility]  hand surgeon does not do elbow surgeries. Call placed to [facility] transfer center by me and pt is in wait list at [facility]; call placed to [place] denied   #Right foot cellulitis- clinically improving Appreciate podiatry recommendations Continue vancomycin. Uric acid level is normal given history of gout X-ray of the right foot with no osteomyelitis are gouty arthritis Started patient on cefepime and continue vancomycin. Discontinue Zosyn as per infectious disease Dr. Algis Downs recommendations  # Anemia -  EGD 10/23/2015  showed gastritis and mild portal hypertensive gastropathyportal gasropathy with renal component?  HB 6.9- PRBC 1 unit , repeat hemoglobin 8.5-8.3--8.9 FOBT neg Hold asa and plavix  #. Acute renal failure: 2/2 ATN-patient's baseline creatinine is 1 on 06/07/2015 -Creatinine 9.09-8.14-7.31-5.23- 4.16 -3.4 -Status post removal of the hemodialysis catheter as patient developed low-grade temperature - Most of the lab work so far has been negative - Urine microscopic exam showed muddy brown casts suggestive of ATN - Dialysis per nephrology, has good urine output so dialysis on hold for now.   #. Pancreatitis: amylase and lipase coming down, now resolved.  Could be due to hypertriglyceridemia. Avoid oral calcium supplements   #hypokalemia and hypomagnesemia  replete potassium and mag recheck in a.m. including magnesium  #. Essential hypertension: Stop lisinopril - hydrochlorothiazide. Continue metoprolol and diltiazem. Clonidine dose increased to 0.2 mg by mouth twice a day  #. Hypercholesterolemia: Continue crestor  #. BPH: Continue Flomax  #.  Multiple kidney stones: Nonobstructive, can follow up with urology as an outpatient  Physical therapy has recommended outpatient PT   GI prophylaxis: As above DVT prophylaxis: SCD  as the patient has an active GI bleed   Discontinue telemetry   All the records are reviewed and case discussed with Care Management/Social  Worker. Management  plans discussed with the patient and nursing.  Discussed with Dr. Candiss Norse  CODE STATUS: FULL [place] THIS PATIENT: 35 minutes.   More than 50% of the time was spent in counseling/coordination of care: YES  POSSIBLE D/C IN 2-3 days, DEPENDING ON CLINICAL CONDITION. AND KIDNEY FUNCTION   Nicholes Mango M.D on 10/29/2015 at 3:51 PM  Between 7am to 6pm - Pager - [phone]  After 6pm go to [email or web address] - Proofreader  [facility]  Office  [phone]  CC: Primary care physician; Kirk Ruths., MD  Note: This dictation was prepared with Dragon dictation along with smaller phrase technology. Any transcriptional errors that result from this process are unintentional.

## 2015-10-29 NOTE — Progress Notes (Signed)
Pts rt elbow effusion is better today. Dr. Mack Guise has reaspirated joint and sent the fluid for color, cells, crystals etc. Pt has h/o Gout, serum uric acid is nml but given the past h/o gouty arthritis, will start him on po prednisone to night. Not considering Indomethacin or colchicine given the h/o ATN during this admission. Pt will be NPO after midnight for possible sx in am. D/W dr.Krasinski, appreciate his recommendations

## 2015-10-29 NOTE — Progress Notes (Signed)
Initial Nutrition Assessment  DOCUMENTATION CODES:   Not applicable  INTERVENTION:  -Monitor intake and cater to pt preferences following surgery, currently NPO.  Recommend heart healthy/carb modified diet order once able to resume po intake -If unable to meet nutritional goals recommend adding glucerna BID for additional kcals and protein   NUTRITION DIAGNOSIS:   Increased nutrient needs related to acute illness as evidenced by estimated needs.    GOAL:   Patient will meet greater than or equal to 90% of their needs    MONITOR:   PO intake, Labs, Weight trends  REASON FOR ASSESSMENT:   LOS    ASSESSMENT:      Pt admitted with septic right elbow joint and leg cellulitis.  Planning surgery on elbow today.  Also admitted with ARF and emergent HD done, temporary cath removed on 7/20  Past Medical History:  Diagnosis Date  . Chest pain    Unspecified  . Chronic kidney disease   . Hypertension   . Hypertriglyceridemia   . Kidney stone on left side   . Palpitations   . Peripheral vascular disease ([place])    Pt reports fair appetite during admission,eating at least 2/3rd of tray.  Noted per I and O sheet recorded intake of 75 -100% of meals Pt reports normal intake prior to admission.  Medications reviewed: tums, colace, folic acid, MVI, omega 3, K chloride, b1, vit b12  Labs reviewed: K 3.4, BUN 29, creatinine 3.34, Mag 1.3, hgb 8.9  Diet Order:  Diet NPO time specified  Skin:  Reviewed, no issues  Last BM:  7/24   Height:   Ht Readings from Last 1 Encounters:  10/19/15 6' (1.829 m)    Weight: Pt reports stable wt prior to admission.  Noted wt change during admission 191 pounds on 7/18 to 183 lb 13 oz (dialysis performed during this admission)  Wt Readings from Last 1 Encounters:  10/29/15 183 lb 13 oz (83.4 kg)    Ideal Body Weight:     BMI:  Body mass index is 24.93 kg/m.  Estimated Nutritional Needs:   Kcal:  2075-2490 kcals/d  Protein:   100-125 g/d  Fluid:  >/= 2 L/d  EDUCATION NEEDS:   No education needs identified at this time  Esias Mory B. Zenia Resides, [place], [place] (pager) Weekend/On-Call pager [phone])

## 2015-10-29 NOTE — Progress Notes (Signed)
Subjective:  Creatinine trending down quickly now. Currently creatinine is 3.3. Serum potassium still slightly low at 3.4.   Objective:  Vital signs in last 24 hours:  Temp:  [99.2 F (37.3 C)-103.2 F (39.6 C)] 99.2 F (37.3 C) (07/25 0439) Pulse Rate:  [92-100] 92 (07/25 0439) Resp:  [18-20] 20 (07/25 0439) BP: (109-123)/(53-65) 114/53 (07/25 0439) SpO2:  [93 %-97 %] 93 % (07/25 0439) Weight:  [83.4 kg (183 lb 13 oz)] 83.4 kg (183 lb 13 oz) (07/25 0455)  Weight change:  Filed Weights   10/26/15 0507 10/27/15 0500 10/29/15 0455  Weight: 86.3 kg (190 lb 3.2 oz) 84.1 kg (185 lb 6.4 oz) 83.4 kg (183 lb 13 oz)    Intake/Output:    Intake/Output Summary (Last 24 hours) at 10/29/15 1126 Last data filed at 10/29/15 0456  Gross per 24 hour  Intake              520 ml  Output             1150 ml  Net             -630 ml     Physical Exam: General: No acute distress  HEENT anicteric, moist mucous membranes  Neck supple  Pulm/lungs Normal breathing effort, clear to auscultation bilaterally  CVS/Heart Regular, no rub or gallop  Abdomen:  Soft, nontender  Extremities:  no peripheral edema, right elbow swelling noted  Neurologic: Alert, oriented, follows commands  Skin: no acute rashes          Basic Metabolic Panel:   Recent Labs Lab 10/23/15 0420  10/24/15 0359 10/25/15 0512  10/26/15 0507 10/26/15 0958 10/26/15 1507 10/26/15 1747 10/27/15 0623 10/27/15 1452 10/28/15 0916 10/29/15 0451  NA 143  < > 141  141 138  --  138  --   --   --  140  --  138 138  K 3.0*  < > 3.0*  2.9* 2.9*  --  2.9* 2.7*  --  2.8* 2.9* 3.6 3.3* 3.4*  CL 107  < > 106  106 105  --  104  --   --   --  105  --  106 105  CO2 28  < > 24  24 23   --  22  --   --   --  23  --  20* 22  GLUCOSE 117*  < > 116*  116* 128*  --  97  --   --   --  105*  --  102* 100*  BUN 47*  < > 43*  43* 45*  --  51*  --   --   --  40*  --  33* 29*  CREATININE 9.15*  < > 8.14*  8.35* 7.31*  --  6.72*  --    --   --  5.23*  --  4.16* 3.34*  CALCIUM 6.8*  < > 6.4*  6.4* 5.8*  --  5.4*  --   --   --  5.6*  --  6.2* 6.0*  MG  --   --   --   --   < > 0.9* 1.5* 2.0 1.9 1.8  --   --  1.3*  PHOS 5.3*  --  3.1  --   --   --   --   --   --   --   --   --   --   < > = values in this interval not  displayed.   CBC:  Recent Labs Lab 10/23/15 0420 10/24/15 0359 10/26/15 0507 10/26/15 1507 10/28/15 0916 10/29/15 0451  WBC 8.7 12.0* 11.7*  --  7.2 6.6  NEUTROABS  --   --  9.0*  --  5.0  --   HGB 7.5* 7.7* 6.7* 8.5* 8.3* 8.9*  HCT 21.9* 23.0* 20.1* 24.5* 24.7* 25.9*  MCV 95.9 94.1 95.6  --  94.6 94.9  PLT 150 172 138*  --  159 159      Microbiology:  Recent Results (from the past 720 hour(s))  Giardia/Cryptosporidium EIA     Status: None   Collection Time: 10/22/15  1:40 AM  Result Value Ref Range Status   Giardia Ag, Stl Negative Negative Final    Comment: (NOTE) Performed At: [facility] [place], [place] [id number] Lindon Romp MD [id number]    Cryptosporidium EIA Negative Negative Final    Comment: (NOTE) Performed At: [facility] [place], [place] [id number] Lindon Romp MD [id number]   CULTURE, BLOOD (ROUTINE X 2) w Reflex to ID Panel     Status: None (Preliminary result)   Collection Time: 10/25/15 10:15 AM  Result Value Ref Range Status   Specimen Description BLOOD LEFT HAND  Final   Special Requests   Final    BOTTLES DRAWN AEROBIC AND ANAEROBIC  AEROBIC 3CC, ANAEROBIC 1CC   Culture NO GROWTH 4 DAYS  Final   Report Status PENDING  Incomplete  CULTURE, BLOOD (ROUTINE X 2) w Reflex to ID Panel     Status: None (Preliminary result)   Collection Time: 10/25/15 10:16 AM  Result Value Ref Range Status   Specimen Description BLOOD LEFT ASSIST CONTROL  Final   Special Requests BOTTLES DRAWN AEROBIC AND ANAEROBIC [place]  Final   Culture NO GROWTH 4 DAYS  Final   Report Status PENDING  Incomplete  Urine culture      Status: Abnormal   Collection Time: 10/25/15  6:32 PM  Result Value Ref Range Status   Specimen Description URINE, CLEAN CATCH  Final   Special Requests Normal  Final   Culture MULTIPLE SPECIES PRESENT, SUGGEST RECOLLECTION (A)  Final   Report Status 10/27/2015 FINAL  Final  Body fluid culture     Status: None (Preliminary result)   Collection Time: 10/26/15  4:32 PM  Result Value Ref Range Status   Specimen Description SYNOVIAL ELBOW  Final   Special Requests Normal  Final   Gram Stain   Final    ABUNDANT WBC PRESENT, PREDOMINANTLY PMN NO ORGANISMS SEEN    Culture   Final    NO GROWTH 2 DAYS Performed at [facility]    Report Status PENDING  Incomplete    Coagulation Studies: No results for input(s): LABPROT, INR in the last 72 hours.  Urinalysis: No results for input(s): COLORURINE, LABSPEC, PHURINE, GLUCOSEU, HGBUR, BILIRUBINUR, KETONESUR, PROTEINUR, UROBILINOGEN, NITRITE, LEUKOCYTESUR in the last 72 hours.  Invalid input(s): APPERANCEUR    Imaging: Mr Elbow Right Wo Contrast  Result Date: 10/27/2015 CLINICAL DATA:  Right elbow pain for 3 days.  No known injury. EXAM: MRI OF THE RIGHT ELBOW WITHOUT CONTRAST TECHNIQUE: Multiplanar, multisequence MR imaging of the elbow was performed. No intravenous contrast was administered. COMPARISON:  Plain films right elbow 10/26/2015. FINDINGS: Patient motion degrades examination. Subcutaneous edema is seen about the elbow and is worse medially. Edema is also identified in the brachialis muscle eccentric worse toward the medial side. No tear  is seen. TENDONS Common forearm flexor origin: Mild edema is seen at the common flexor origin. No tendon tear. Common forearm extensor origin: Edema is seen in the common extensor origin. No frank tendon tear. Biceps: Intact. Triceps: Intact. LIGAMENTS Medial stabilizers: Intact. Lateral stabilizers:  Intact. Cartilage: Unremarkable. Joint: Large elbow joint effusion is seen. Cubital tunnel:  Unremarkable. Bones: No fracture or focal bony lesion is identified. Marrow signal is normal. IMPRESSION: Large elbow joint effusion. Cause for the effusion is not identified. Edema in the brachialis muscle eccentrically worse on the medial side without tear is nonspecific but could be due to strain or infectious or inflammatory myositis. Findings consistent with medial and lateral epicondylitis without tendon tear. Changes appear worse on the lateral side. Electronically Signed   By: Inge Rise M.D.   On: 10/27/2015 18:19  Mr Foot Right Wo Contrast  Result Date: 10/29/2015 CLINICAL DATA:  Right foot edema and erythema along the dorsal aspect extending into the right ankle. EXAM: MRI OF THE RIGHT FOREFOOT WITHOUT CONTRAST TECHNIQUE: Multiplanar, multisequence MR imaging was performed. No intravenous contrast was administered. COMPARISON:  None. FINDINGS: TENDONS Peroneal: Peroneal longus tendon intact. Longitudinal split tear of the proximal peroneus brevis tendon. Mild peroneal tenosynovitis. Posteromedial: Posterior tibial tendon intact. Flexor hallucis longus tendon intact. Flexor digitorum longus tendon intact. Anterior: Tibialis anterior tendon intact. Extensor hallucis longus tendon intact Extensor digitorum longus tendon intact. Achilles:  Intact. Plantar Fascia: Intact. LIGAMENTS Lateral: Anterior talofibular ligament intact. Posterior talofibular ligament intact. Anterior and posterior tibiofibular ligaments intact. Medial: Deltoid ligament intact. Spring ligament intact. CARTILAGE Ankle Joint: No joint effusion. Normal ankle mortise. No chondral defect. Subtalar Joints/Sinus Tarsi: Normal subtalar joints. No subtalar joint effusion. Normal sinus tarsi. Bones: No marrow signal abnormality.  No fracture or dislocation. Soft Tissue: Severe soft tissue edema in the subcutaneous fat along the dorsal aspect of the forefoot, midfoot and extending into the ankle most concerning for severe cellulitis. No  focal fluid collection or hematoma. IMPRESSION: 1. Severe soft tissue edema in the subcutaneous fat along the dorsal aspect of the forefoot, midfoot and extending into the ankle most concerning for severe cellulitis. No focal fluid collection or hematoma. 2. No osteomyelitis of the right foot. Electronically Signed   By: Kathreen Devoid   On: 10/29/2015 11:08  Dg Foot Complete Right  Result Date: 10/28/2015 CLINICAL DATA:  Redness and swelling of the right foot. History of gout and peripheral vascular disease. EXAM: RIGHT FOOT COMPLETE - 3+ VIEW COMPARISON:  None. FINDINGS: There is no evidence of fracture or dislocation. There is no evidence of arthropathy or other focal bone abnormality. No bony erosions are seen. Soft tissues demonstrate mid from dorsal swelling. IMPRESSION: No acute fracture or dislocation identified about the right foot. No radiographic evidence of osteomyelitis or stigmata of gouty arthritis. Electronically Signed   By: Fidela Salisbury M.D.   On: 10/28/2015 13:49    Medications:     . calcium carbonate  500 mg of elemental calcium Oral BID WC  . ceFEPime (MAXIPIME) IV  1 g Intravenous Q24H  . cloNIDine  0.1 mg Oral BID  . diltiazem  120 mg Oral Daily  . docusate sodium  100 mg Oral BID  . folic acid  1 mg Oral Daily  . heparin  5,000 Units Intravenous Once  . metoprolol  200 mg Oral Daily  . multivitamin with minerals  1 tablet Oral Daily  . omega-3 acid ethyl esters  4 g Oral Daily  .  pantoprazole (PROTONIX) IV  40 mg Intravenous Q12H  . potassium chloride  40 mEq Oral Daily  . rosuvastatin  40 mg Oral QPM  . sodium bicarbonate  1,300 mg Oral BID  . sodium chloride flush  3 mL Intravenous Q12H  . tamsulosin  0.4 mg Oral Daily  . thiamine  100 mg Oral Daily  . vitamin B-12  2,000 mcg Oral Daily   acetaminophen **OR** acetaminophen, morphine injection, ondansetron **OR** ondansetron (ZOFRAN) IV, oxyCODONE  Assessment/ Plan:  55 y.o. male originally from [place], history of alcohol abuse, hepatitis, peripheral vascular disease, hypertension, hyperlipidemia, BPH, admitted to [facility] on July 15 for acute renal failure   1. Acute Renal Failure: baseline creatinine of 1 on 06/07/15 2. Hyperkalemia, now hypokalemia 3. Metabolic acidosis 4. Anemia with renal failure: 2 units PRBC on 7/15  5. Hypocalcemia: calcium of 6.2 6. Acute pancreatitis 7.  Hyperlipidemia, severe 8. Fever - T max 102.8 right elbow septic joint  Serological studies have been negative so far.  Urine protein/creatine ratio is 0.6. Urine microscopic exam performed by Dr. Juleen China showed muddy brown cast suggesting ATN.   Plan:  Renal function continues to improve daily.  Creatinine currently down to 3.3 with good urine output noted.  Continue to avoid nephrotoxins as possible.  Serum potassium up to 3.4 therefore we will continue the patient on potassium repletion. Anemia also appears to be stable at the moment with a hemoglobin of 8.9.  He is waiting further surgical evaluation for swelling of his right elbow joint.  Further plan as patient progresses.   LOS: 10 Marilynn Ekstein 7/25/201711:26 AM

## 2015-10-29 NOTE — Progress Notes (Signed)
7 Days Post-Op  Subjective: Patient seen. States he thinks the right foot is doing some better. Just a little pain at this point.  Objective: Vital signs in last 24 hours: Temp:  [99.2 F (37.3 C)-103.2 F (39.6 C)] 101.3 F (38.5 C) (07/25 1313) Pulse Rate:  [55-100] 55 (07/25 1313) Resp:  [17-20] 17 (07/25 1313) BP: (109-128)/(53-73) 128/73 (07/25 1313) SpO2:  [91 %-97 %] 91 % (07/25 1313) Weight:  [83.4 kg (183 lb 13 oz)] 83.4 kg (183 lb 13 oz) (07/25 0455) Last BM Date: 10/28/15  Intake/Output from previous day: 07/24 0701 - 07/25 0700 In: 520 [P.O.:420; IV Piggyback:100] Out: 1150 [Urine:1150] Intake/Output this shift: Total I/O In: -  Out: 1500 [Urine:1500]  Still some edema, erythema, and ecchymosis. No blister formation or open lesions.  Lab Results:   Recent Labs  10/28/15 0916 10/29/15 0451  WBC 7.2 6.6  HGB 8.3* 8.9*  HCT 24.7* 25.9*  PLT 159 159   BMET  Recent Labs  10/28/15 0916 10/29/15 0451  NA 138 138  K 3.3* 3.4*  CL 106 105  CO2 20* 22  GLUCOSE 102* 100*  BUN 33* 29*  CREATININE 4.16* 3.34*  CALCIUM 6.2* 6.0*   PT/INR No results for input(s): LABPROT, INR in the last 72 hours. ABG No results for input(s): PHART, HCO3 in the last 72 hours.  Invalid input(s): PCO2, PO2  Studies/Results: Mr Elbow Right Wo Contrast  Result Date: 10/27/2015 CLINICAL DATA:  Right elbow pain for 3 days.  No known injury. EXAM: MRI OF THE RIGHT ELBOW WITHOUT CONTRAST TECHNIQUE: Multiplanar, multisequence MR imaging of the elbow was performed. No intravenous contrast was administered. COMPARISON:  Plain films right elbow 10/26/2015. FINDINGS: Patient motion degrades examination. Subcutaneous edema is seen about the elbow and is worse medially. Edema is also identified in the brachialis muscle eccentric worse toward the medial side. No tear is seen. TENDONS Common forearm flexor origin: Mild edema is seen at the common flexor origin. No tendon tear. Common  forearm extensor origin: Edema is seen in the common extensor origin. No frank tendon tear. Biceps: Intact. Triceps: Intact. LIGAMENTS Medial stabilizers: Intact. Lateral stabilizers:  Intact. Cartilage: Unremarkable. Joint: Large elbow joint effusion is seen. Cubital tunnel: Unremarkable. Bones: No fracture or focal bony lesion is identified. Marrow signal is normal. IMPRESSION: Large elbow joint effusion. Cause for the effusion is not identified. Edema in the brachialis muscle eccentrically worse on the medial side without tear is nonspecific but could be due to strain or infectious or inflammatory myositis. Findings consistent with medial and lateral epicondylitis without tendon tear. Changes appear worse on the lateral side. Electronically Signed   By: Inge Rise M.D.   On: 10/27/2015 18:19  Mr Foot Right Wo Contrast  Result Date: 10/29/2015 CLINICAL DATA:  Right foot edema and erythema along the dorsal aspect extending into the right ankle. EXAM: MRI OF THE RIGHT FOREFOOT WITHOUT CONTRAST TECHNIQUE: Multiplanar, multisequence MR imaging was performed. No intravenous contrast was administered. COMPARISON:  None. FINDINGS: TENDONS Peroneal: Peroneal longus tendon intact. Longitudinal split tear of the proximal peroneus brevis tendon. Mild peroneal tenosynovitis. Posteromedial: Posterior tibial tendon intact. Flexor hallucis longus tendon intact. Flexor digitorum longus tendon intact. Anterior: Tibialis anterior tendon intact. Extensor hallucis longus tendon intact Extensor digitorum longus tendon intact. Achilles:  Intact. Plantar Fascia: Intact. LIGAMENTS Lateral: Anterior talofibular ligament intact. Posterior talofibular ligament intact. Anterior and posterior tibiofibular ligaments intact. Medial: Deltoid ligament intact. Spring ligament intact. CARTILAGE Ankle Joint: No joint effusion.  Normal ankle mortise. No chondral defect. Subtalar Joints/Sinus Tarsi: Normal subtalar joints. No subtalar joint  effusion. Normal sinus tarsi. Bones: No marrow signal abnormality.  No fracture or dislocation. Soft Tissue: Severe soft tissue edema in the subcutaneous fat along the dorsal aspect of the forefoot, midfoot and extending into the ankle most concerning for severe cellulitis. No focal fluid collection or hematoma. IMPRESSION: 1. Severe soft tissue edema in the subcutaneous fat along the dorsal aspect of the forefoot, midfoot and extending into the ankle most concerning for severe cellulitis. No focal fluid collection or hematoma. 2. No osteomyelitis of the right foot. Electronically Signed   By: Kathreen Devoid   On: 10/29/2015 11:08  Dg Foot Complete Right  Result Date: 10/28/2015 CLINICAL DATA:  Redness and swelling of the right foot. History of gout and peripheral vascular disease. EXAM: RIGHT FOOT COMPLETE - 3+ VIEW COMPARISON:  None. FINDINGS: There is no evidence of fracture or dislocation. There is no evidence of arthropathy or other focal bone abnormality. No bony erosions are seen. Soft tissues demonstrate mid from dorsal swelling. IMPRESSION: No acute fracture or dislocation identified about the right foot. No radiographic evidence of osteomyelitis or stigmata of gouty arthritis. Electronically Signed   By: Fidela Salisbury M.D.   On: 10/28/2015 13:49   Anti-infectives: Anti-infectives    Start     Dose/Rate Route Frequency Ordered Stop   10/29/15 2200  ceFEPIme (MAXIPIME) 1 g in dextrose 5 % 50 mL IVPB     1 g 100 mL/hr over 30 Minutes Intravenous Every 24 hours 10/29/15 0808     10/29/15 2100  vancomycin (VANCOCIN) IVPB 1000 mg/200 mL premix     1,000 mg 200 mL/hr over 60 Minutes Intravenous Every 24 hours 10/29/15 1217     10/28/15 1930  vancomycin (VANCOCIN) IVPB 1000 mg/200 mL premix  Status:  Discontinued     1,000 mg 200 mL/hr over 60 Minutes Intravenous  Once 10/28/15 1914 10/28/15 1917   10/28/15 1930  vancomycin (VANCOCIN) 1,250 mg in sodium chloride 0.9 % 250 mL IVPB     1,250  mg 166.7 mL/hr over 90 Minutes Intravenous  Once 10/28/15 1917 10/28/15 2206   10/28/15 1200  ceFEPIme (MAXIPIME) 1 g in dextrose 5 % 50 mL IVPB  Status:  Discontinued    Comments:  Pharmacy to dose   1 g 100 mL/hr over 30 Minutes Intravenous Every 12 hours 10/28/15 1143 10/29/15 0808   10/27/15 1800  vancomycin (VANCOCIN) IVPB 1000 mg/200 mL premix  Status:  Discontinued     1,000 mg 200 mL/hr over 60 Minutes Intravenous Every 48 hours 10/27/15 1735 10/28/15 1916   10/27/15 1000  levofloxacin (LEVAQUIN) IVPB 500 mg  Status:  Discontinued     500 mg 100 mL/hr over 60 Minutes Intravenous Every 48 hours 10/25/15 0940 10/26/15 1112   10/26/15 1100  vancomycin (VANCOCIN) IVPB 1000 mg/200 mL premix  Status:  Discontinued     1,000 mg 200 mL/hr over 60 Minutes Intravenous Every 48 hours 10/26/15 1051 10/27/15 1735   10/25/15 2200  piperacillin-tazobactam (ZOSYN) IVPB 3.375 g  Status:  Discontinued     3.375 g 12.5 mL/hr over 240 Minutes Intravenous Every 12 hours 10/25/15 0931 10/28/15 1141   10/25/15 0945  piperacillin-tazobactam (ZOSYN) IVPB 3.375 g     3.375 g 100 mL/hr over 30 Minutes Intravenous  Once 10/25/15 0931 10/25/15 1200   10/25/15 0945  levofloxacin (LEVAQUIN) IVPB 750 mg     750 mg 100  mL/hr over 90 Minutes Intravenous  Once 10/25/15 0940 10/25/15 1300   10/25/15 0930  piperacillin-tazobactam (ZOSYN) IVPB 3.375 g  Status:  Discontinued     3.375 g 100 mL/hr over 30 Minutes Intravenous Every 8 hours 10/25/15 0924 10/25/15 0931      Assessment/Plan: s/p Procedure(s): ESOPHAGOGASTRODUODENOSCOPY (EGD) WITH PROPOFOL (N/A) Assessment: Cellulitis right foot and ankle.   Plan: Discussed with the patient that I did review the MRI and results and it showed just some cellulitis with no evidence of any fluid collections or abscess formation. At this point would recommend just continued observation. Feel free to reconsult if any worsening or increased symptoms.  LOS: 10 days     Durward Fortes 10/29/2015

## 2015-10-29 NOTE — Care Management Note (Signed)
Case Management Note  Patient Details  Name: [name] [name] MRN: [id number] Date of Birth: 1960-07-20  Subjective/Objective:    Currently Transfer wait list at [facility]. No bed available yet.                 Action/Plan:   Expected Discharge Date:                  Expected Discharge Plan:     In-House Referral:     Discharge planning Services     Post Acute Care Choice:    Choice offered to:     DME Arranged:    DME Agency:     HH Arranged:    HH Agency:     Status of Service:     If discussed at [employer] of Stay Meetings, dates discussed:    Additional Comments:  Jeryn Cerney A, RN 10/29/2015, 4:23 PM

## 2015-10-29 NOTE — Progress Notes (Signed)
Pharmacy Antibiotic Note  [name] [name] is a 55 y.o. male admitted on 10/19/2015 with sepsis/septic right elbow joint/ foot/ankle cellulitis .  Pharmacy has been consulted for vancomycin dosing/cefepime. (Patient not on HD at home- had emergent HD here. Temp.HD cath was removed 10/24/15)  Plan: Will plan for vancomycin 1000 mg IV q 48 hours. Goal trough 15-20 mcg/mL. Due to patients renal function and not on current dialysis, will get random vanc level in 24 hours and adjust dose accordingly. Will monitor for initiation dialysis and renal function.  7/23:  Vanc random level was 10 at 1050 (not reported until 1714 due to lab instrument being down). Will give another dose of Vancomycin 1gm IV x1 with a plan for Vancomycin 1gm IV every 48 hours.  Ordered another Vanc random for 7/24 at 1800 (this is 24 hrs after last dose).   7/24: Random level resulted @ 12 mcg/ml. Will redose @ Vancomycin 1250 mg IV x 1. Will recheck random level 24 hours post dose if level within goal range again and renal function continues to improve, would initiate Vancomycin 1g IV q24 hours and follow patient clinically. Cefepime 1 gram q12h ordered per MD  7/25 Scr improving. Vancomycin level and Scr ordered for 2000. Order for Vancomycin 1 gram IV q24h entered to start at 2100.  PK: Ke 0.027, t1/2 25.6  Vd 58. patient with fever T 103.2 last PM. Awaiting surgical evaluation of elbow.  7/25: Trough: 47mcg/ml, will continue current regimen of vancomycin 1gm IV Q24H. Continue to follow renal function and recheck trough after 2 more doses.   Will adjust cefepime to 2gm IV Q24H (renal dose adjustment 2gm IV Q12H based on indication)   Height: 6' (182.9 cm) Weight: 183 lb 13 oz (83.4 kg) IBW/kg (Calculated) : 77.6  Temp (24hrs), Avg:100.8 F (38.2 C), Min:99.2 F (37.3 C), Max:102.8 F (39.3 C)   Recent Labs Lab 10/23/15 0420  10/24/15 0359  10/26/15 0507 10/27/15 0623 10/27/15 1050 10/28/15 0916  10/28/15 1806 10/29/15 0451 10/29/15 2002  WBC 8.7  --  12.0*  --  11.7*  --   --  7.2  --  6.6  --   CREATININE 9.15*  < > 8.14*  8.35*  < > 6.72* 5.23*  --  4.16*  --  3.34* 2.82*  VANCOTROUGH  --   --   --   --   --   --   --   --   --   --  18  VANCORANDOM  --   --   --   --   --   --  10  --  12  --   --   < > = values in this interval not displayed.  Estimated Creatinine Clearance: 32.1 mL/min (by C-G formula based on SCr of 2.82 mg/dL).    Allergies  Allergen Reactions  . Fenofibrate Other (See Comments)    Antimicrobials this admission: Levofloxacin 07/21 >> 07/22 Piperacillin-tazobactam 07/21 >> 7/24 Cefepime 7/24 >> Vancomycin 07/22 >>  Microbiology results: 07/21 BCx: NGTD 07/21 UCx: multiple species Elbow cx: + WBC, no organsism  Thank you for allowing pharmacy to be a part of this patient's care.  Rexene Edison, PharmD Clinical Pharmacist  10/29/2015

## 2015-10-29 NOTE — Progress Notes (Addendum)
Pharmacy Antibiotic Note  [name] [name] is a 55 y.o. male admitted on 10/19/2015 with sepsis/septic right elbow joint/ foot/ankle cellulitis .  Pharmacy has been consulted for vancomycin dosing/cefepime. (Patient not on HD at home- had emergent HD here. Temp.HD cath was removed 10/24/15)  Plan: Will plan for vancomycin 1000 mg IV q 48 hours. Goal trough 15-20 mcg/mL. Due to patients renal function and not on current dialysis, will get random vanc level in 24 hours and adjust dose accordingly. Will monitor for initiation dialysis and renal function.  7/23:  Vanc random level was 10 at 1050 (not reported until 1714 due to lab instrument being down). Will give another dose of Vancomycin 1gm IV x1 with a plan for Vancomycin 1gm IV every 48 hours.  Ordered another Vanc random for 7/24 at 1800 (this is 24 hrs after last dose).   7/24: Random level resulted @ 12 mcg/ml. Will redose @ Vancomycin 1250 mg IV x 1. Will recheck random level 24 hours post dose if level within goal range again and renal function continues to improve, would initiate Vancomycin 1g IV q24 hours and follow patient clinically. Cefepime 1 gram q12h ordered per MD  7/25 Scr improving. Vancomycin level and Scr ordered for 2000. Order for Vancomycin 1 gram IV q24h entered to start at 2100.  PK: Ke 0.027, t1/2 25.6  Vd 58. patient with fever T 103.2 last PM. Awaiting surgical evaluation of elbow.  Cefepime adjusted to 1 gram IV q24h.     Height: 6' (182.9 cm) Weight: 183 lb 13 oz (83.4 kg) IBW/kg (Calculated) : 77.6  Temp (24hrs), Avg:101.1 F (38.4 C), Min:99.2 F (37.3 C), Max:103.2 F (39.6 C)   Recent Labs Lab 10/23/15 0420  10/24/15 0359 10/25/15 0512 10/26/15 0507 10/27/15 0623 10/27/15 1050 10/28/15 0916 10/28/15 1806 10/29/15 0451  WBC 8.7  --  12.0*  --  11.7*  --   --  7.2  --  6.6  CREATININE 9.15*  < > 8.14*  8.35* 7.31* 6.72* 5.23*  --  4.16*  --  3.34*  VANCORANDOM  --   --   --   --   --    --  10  --  12  --   < > = values in this interval not displayed.  Estimated Creatinine Clearance: 27.1 mL/min (by C-G formula based on SCr of 3.34 mg/dL).    Allergies  Allergen Reactions  . Fenofibrate Other (See Comments)    Antimicrobials this admission: Levofloxacin 07/21 >> 07/22 Piperacillin-tazobactam 07/21 >> 7/24 Cefepime 7/24 >> Vancomycin 07/22 >>  Microbiology results: 07/21 BCx: NGTD 07/21 UCx: multiple species Elbow cx: + WBC, no organsism  Thank you for allowing pharmacy to be a part of this patient's care.  Chinita Greenland PharmD Clinical Pharmacist 10/29/2015

## 2015-10-29 NOTE — Progress Notes (Signed)
Subjective:  Operating room at [facility] still nonoperational today. Patient's I and D surgery of the right elbow for today was canceled. Patient reports pain in the right foot is significantly improved in the right elbow is mildly improved. Patient continued to have fevers overnight. Patient currently on vancomycin and cefepime.   Objective:   VITALS:   Vitals:   10/29/15 0455 10/29/15 1313 10/29/15 1320 10/29/15 1729  BP:  128/73  135/63  Pulse:  (!) 55  (!) 104  Resp:  17    Temp:  (!) 101.3 F (38.5 C) 99.7 F (37.6 C)   TempSrc:  Oral Oral   SpO2:  91%    Weight: 83.4 kg (183 lb 13 oz)     Height:        PHYSICAL EXAM:  Right elbow: Patient with decreased swelling. No erythema. Range of motion from -20 to approximately 120 of flexion. He has 90 of pronation supination. His neurovascular intact.  Right lower extremity: Erythema and swelling much improved. Patient with intact sensation to touch in palpable pedal pulses. He has intact motor function without pain.   LABS  Results for orders placed or performed during the hospital encounter of 10/19/15 (from the past 24 hour(s))  Vancomycin, random     Status: None   Collection Time: 10/28/15  6:06 PM  Result Value Ref Range   Vancomycin Rm 12   CBC     Status: Abnormal   Collection Time: 10/29/15  4:51 AM  Result Value Ref Range   WBC 6.6 3.8 - 10.6 K/uL   RBC 2.73 (L) 4.40 - 5.90 MIL/uL   Hemoglobin 8.9 (L) 13.0 - 18.0 g/dL   HCT 25.9 (L) 40.0 - 52.0 %   MCV 94.9 80.0 - 100.0 fL   MCH 32.4 26.0 - 34.0 pg   MCHC 34.2 32.0 - 36.0 g/dL   RDW 15.9 (H) 11.5 - 14.5 %   Platelets 159 150 - 440 K/uL  Basic metabolic panel     Status: Abnormal   Collection Time: 10/29/15  4:51 AM  Result Value Ref Range   Sodium 138 135 - 145 mmol/L   Potassium 3.4 (L) 3.5 - 5.1 mmol/L   Chloride 105 101 - 111 mmol/L   CO2 22 22 - 32 mmol/L   Glucose, Bld 100 (H) 65 - 99 mg/dL   BUN 29 (H) 6 - 20 mg/dL    Creatinine, Ser 3.34 (H) 0.61 - 1.24 mg/dL   Calcium 6.0 (LL) 8.9 - 10.3 mg/dL   GFR calc non Af Amer 19 (L) >60 mL/min   GFR calc Af Amer 22 (L) >60 mL/min   Anion gap 11 5 - 15  Magnesium     Status: Abnormal   Collection Time: 10/29/15  4:51 AM  Result Value Ref Range   Magnesium 1.3 (L) 1.7 - 2.4 mg/dL    Mr Foot Right Wo Contrast  Result Date: 10/29/2015 CLINICAL DATA:  Right foot edema and erythema along the dorsal aspect extending into the right ankle. EXAM: MRI OF THE RIGHT FOREFOOT WITHOUT CONTRAST TECHNIQUE: Multiplanar, multisequence MR imaging was performed. No intravenous contrast was administered. COMPARISON:  None. FINDINGS: TENDONS Peroneal: Peroneal longus tendon intact. Longitudinal split tear of the proximal peroneus brevis tendon. Mild peroneal tenosynovitis. Posteromedial: Posterior tibial tendon intact. Flexor hallucis longus tendon intact. Flexor digitorum longus tendon intact. Anterior: Tibialis anterior tendon intact. Extensor hallucis longus tendon intact Extensor digitorum longus tendon intact. Achilles:  Intact. Plantar Fascia:  Intact. LIGAMENTS Lateral: Anterior talofibular ligament intact. Posterior talofibular ligament intact. Anterior and posterior tibiofibular ligaments intact. Medial: Deltoid ligament intact. Spring ligament intact. CARTILAGE Ankle Joint: No joint effusion. Normal ankle mortise. No chondral defect. Subtalar Joints/Sinus Tarsi: Normal subtalar joints. No subtalar joint effusion. Normal sinus tarsi. Bones: No marrow signal abnormality.  No fracture or dislocation. Soft Tissue: Severe soft tissue edema in the subcutaneous fat along the dorsal aspect of the forefoot, midfoot and extending into the ankle most concerning for severe cellulitis. No focal fluid collection or hematoma. IMPRESSION: 1. Severe soft tissue edema in the subcutaneous fat along the dorsal aspect of the forefoot, midfoot and extending into the ankle most concerning for severe cellulitis.  No focal fluid collection or hematoma. 2. No osteomyelitis of the right foot. Electronically Signed   By: Kathreen Devoid   On: 10/29/2015 11:08  Dg Foot Complete Right  Result Date: 10/28/2015 CLINICAL DATA:  Redness and swelling of the right foot. History of gout and peripheral vascular disease. EXAM: RIGHT FOOT COMPLETE - 3+ VIEW COMPARISON:  None. FINDINGS: There is no evidence of fracture or dislocation. There is no evidence of arthropathy or other focal bone abnormality. No bony erosions are seen. Soft tissues demonstrate mid from dorsal swelling. IMPRESSION: No acute fracture or dislocation identified about the right foot. No radiographic evidence of osteomyelitis or stigmata of gouty arthritis. Electronically Signed   By: Fidela Salisbury M.D.   On: 10/28/2015 13:49   Assessment/Plan: 7 Days Post-Op   Active Problems:   AKI (acute kidney injury) ([place])   Absolute anemia   Other acute pancreatitis   Acute blood loss anemia   Esophagitis, unspecified   Portal hypertension ([place])  Patient's right leg cellulitis is dramatically improved since yesterday. His right elbow is modestly improved. Since his surgery has been canceled I recommended that we try another aspiration of his right elbow. Patient agreed.   Procedure note:  Patient's lateral right elbow was prepped with Betadine. He was pre-injected with a 50-50 mix of 1% lidocaine plain and half percent Marcaine plain. After approximately 5-10 minutes an 18-gauge spinal needle on a 10 cc syringe was used to aspirate the right elbow. Approximately a cc and a half of blood-tinged fluid was aspirated. This was sent to the laboratory for analysis. I've ordered a cell count, crystal analysis a stat Gram stain and culture on this fluid.  Patient has a history of gout. It is possible that the elbow effusion with an elevated white count may be gout in the right elbow. His previous gouty attacks have been isolated to the right ankle. I discussed this  possibility with Dr. Margaretmary Eddy. She will order either colchicine or oral steroids overnight to treat some gout. Which medication to administer is dependent on colchicine defects on creatinine. The patient has elevated creatinine.  Patient will continue on IV antibiotics. He'll be nothing by mouth after midnight for possible surgery tomorrow. Patient understood and agreed with this plan.      Thornton Park , MD 10/29/2015, 5:53 PM

## 2015-10-30 ENCOUNTER — Encounter: Payer: Self-pay | Admitting: Certified Registered Nurse Anesthetist

## 2015-10-30 ENCOUNTER — Encounter
Admission: EM | Disposition: A | Discharge status: Home / Self Care | Payer: Self-pay | Source: Home / Self Care | Attending: Internal Medicine

## 2015-10-30 LAB — CULTURE, BLOOD (ROUTINE X 2)
CULTURE: NO GROWTH
Culture: NO GROWTH

## 2015-10-30 LAB — CREATININE, SERUM
Creatinine, Ser: 2.37 mg/dL — ABNORMAL HIGH (ref 0.61–1.24)
GFR, EST AFRICAN AMERICAN: 34 mL/min — AB (ref >60)
GFR, EST NON AFRICAN AMERICAN: 29 mL/min — AB (ref >60)

## 2015-10-30 LAB — MAGNESIUM: MAGNESIUM: 1.8 mg/dL (ref 1.7–2.4)

## 2015-10-30 SURGERY — INCISION AND DRAINAGE
Anesthesia: Choice | Laterality: Right

## 2015-10-30 MED ORDER — PREDNISONE 20 MG PO TABS
20.0000 mg | ORAL_TABLET | Freq: Every day | ORAL | Status: DC
  Administered 2015-10-30: 20 mg via ORAL
  Filled 2015-10-30: qty 1

## 2015-10-30 MED ORDER — NEOMYCIN-POLYMYXIN B GU 40-200000 IR SOLN
Status: AC
  Filled 2015-10-30: qty 2

## 2015-10-30 MED ORDER — HYDROCODONE-ACETAMINOPHEN 5-325 MG PO TABS: 1.0000 | ORAL_TABLET | Freq: Four times a day (QID) | ORAL | 0 refills | Status: DC | PRN

## 2015-10-30 MED ORDER — SODIUM CHLORIDE 0.9 % IV SOLN
1250.0000 mg | INTRAVENOUS | Status: DC
  Filled 2015-10-30: qty 1250

## 2015-10-30 MED ORDER — THIAMINE HCL 100 MG PO TABS: 100.0000 mg | ORAL_TABLET | Freq: Every day | ORAL | 0 refills | Status: DC

## 2015-10-30 MED ORDER — CEPHALEXIN 250 MG PO CAPS
250.0000 mg | ORAL_CAPSULE | Freq: Three times a day (TID) | ORAL | Status: DC
Start: 2015-10-30 — End: 2015-10-30
  Administered 2015-10-30: 250 mg via ORAL
  Filled 2015-10-30: qty 1

## 2015-10-30 MED ORDER — PREDNISONE 5 MG PO TABS: ORAL_TABLET | ORAL | 0 refills | Status: DC

## 2015-10-30 MED ORDER — CEPHALEXIN 250 MG PO CAPS: 250.0000 mg | ORAL_CAPSULE | Freq: Three times a day (TID) | ORAL | 0 refills | Status: DC

## 2015-10-30 SURGICAL SUPPLY — 27 items
BANDAGE ELASTIC 3 LF NS (GAUZE/BANDAGES/DRESSINGS) ×3 IMPLANT
BLADE SURG MINI STRL (BLADE) ×3 IMPLANT
BNDG ESMARK 4X12 TAN STRL LF (GAUZE/BANDAGES/DRESSINGS) ×3 IMPLANT
CANISTER SUCT 1200ML W/VALVE (MISCELLANEOUS) ×3 IMPLANT
CAST PADDING 3X4FT ST 30246 (SOFTGOODS) ×2
CHLORAPREP W/TINT 26ML (MISCELLANEOUS) ×6 IMPLANT
CUFF TOURN 18 STER (MISCELLANEOUS) IMPLANT
DRAPE SURG 17X11 SM STRL (DRAPES) ×3 IMPLANT
DRAPE U-SHAPE 47X51 STRL (DRAPES) ×3 IMPLANT
ELECT REM PT RETURN 9FT ADLT (ELECTROSURGICAL) ×3
ELECTRODE REM PT RTRN 9FT ADLT (ELECTROSURGICAL) ×1 IMPLANT
GAUZE PETRO XEROFOAM 1X8 (MISCELLANEOUS) ×3 IMPLANT
GAUZE SPONGE 4X4 12PLY STRL (GAUZE/BANDAGES/DRESSINGS) ×3 IMPLANT
GLOVE BIOGEL PI IND STRL 9 (GLOVE) ×1 IMPLANT
GLOVE BIOGEL PI INDICATOR 9 (GLOVE) ×2
GLOVE SURG 9.0 ORTHO LTXF (GLOVE) ×3 IMPLANT
GOWN STRL REUS TWL 2XL XL LVL4 (GOWN DISPOSABLE) ×3 IMPLANT
GOWN STRL REUS W/ TWL LRG LVL3 (GOWN DISPOSABLE) ×1 IMPLANT
GOWN STRL REUS W/TWL LRG LVL3 (GOWN DISPOSABLE) ×2
KIT RM TURNOVER STRD PROC AR (KITS) ×3 IMPLANT
NS IRRIG 500ML POUR BTL (IV SOLUTION) ×3 IMPLANT
PACK EXTREMITY ARMC (MISCELLANEOUS) IMPLANT
PAD CAST CTTN 3X4 STRL (SOFTGOODS) ×1 IMPLANT
PAD PREP 24X41 OB/GYN DISP (PERSONAL CARE ITEMS) ×3 IMPLANT
STOCKINETTE STRL 4IN 9604848 (GAUZE/BANDAGES/DRESSINGS) ×3 IMPLANT
STRAP SAFETY BODY (MISCELLANEOUS) ×3 IMPLANT
SUT ETHILON 4 0 P 3 18 (SUTURE) IMPLANT

## 2015-10-30 NOTE — Progress Notes (Signed)
Pharmacy Antibiotic Note  [name] [name] is a 55 y.o. male admitted on 10/19/2015 with sepsis/septic right elbow joint/ foot/ankle cellulitis .  Pharmacy has been consulted for vancomycin dosing/cefepime. (Patient not on HD at home- had emergent HD here. Temp.HD cath was removed 10/24/15)  Plan: Will plan for vancomycin 1000 mg IV q 48 hours. Goal trough 15-20 mcg/mL. Due to patients renal function and not on current dialysis, will get random vanc level in 24 hours and adjust dose accordingly. Will monitor for initiation dialysis and renal function.  7/23:  Vanc random level was 10 at 1050 (not reported until 1714 due to lab instrument being down). Will give another dose of Vancomycin 1gm IV x1 with a plan for Vancomycin 1gm IV every 48 hours.  Ordered another Vanc random for 7/24 at 1800 (this is 24 hrs after last dose).   7/24: Random level resulted @ 12 mcg/ml. Will redose @ Vancomycin 1250 mg IV x 1. Will recheck random level 24 hours post dose if level within goal range again and renal function continues to improve, would initiate Vancomycin 1g IV q24 hours and follow patient clinically. Cefepime 1 gram q12h ordered per MD  7/25 Scr improving. Vancomycin level and Scr ordered for 2000. Order for Vancomycin 1 gram IV q24h entered to start at 2100.  PK: Ke 0.027, t1/2 25.6  Vd 58. patient with fever T 103.2 last PM. Awaiting surgical evaluation of elbow.  7/25: Trough: 72mcg/ml, will continue current regimen of vancomycin 1gm IV Q24H. Continue to follow renal function and recheck trough after 2 more doses.   7/26: Renal fxn continues to improve. Based on new PK (Ke 0.036, t1/2 19  Vd 55) will adjust Vancomycin to 1250mg  IV q24h. F/u renal fxn. Next level scheduled for 7/27 2030. Possible surgery today.  Will continue cefepime 2gm IV Q24H (renal dose adjustment from 2gm IV Q12H based on indication)   Height: 6' (182.9 cm) Weight: 173 lb 3.2 oz (78.6 kg) IBW/kg (Calculated) :  77.6  Temp (24hrs), Avg:100.5 F (38.1 C), Min:97.7 F (36.5 C), Max:102.8 F (39.3 C)   Recent Labs Lab 10/24/15 0359  10/26/15 0507 10/27/15 0623 10/27/15 1050 10/28/15 0916 10/28/15 1806 10/29/15 0451 10/29/15 2002 10/30/15 0520  WBC 12.0*  --  11.7*  --   --  7.2  --  6.6  --   --   CREATININE 8.14*  8.35*  < > 6.72* 5.23*  --  4.16*  --  3.34* 2.82* 2.37*  VANCOTROUGH  --   --   --   --   --   --   --   --  18  --   VANCORANDOM  --   --   --   --  10  --  12  --   --   --   < > = values in this interval not displayed.  Estimated Creatinine Clearance: 38.2 mL/min (by C-G formula based on SCr of 2.37 mg/dL).    Allergies  Allergen Reactions  . Fenofibrate Other (See Comments)    Antimicrobials this admission: Levofloxacin 07/21 >> 07/22 Piperacillin-tazobactam 07/21 >> 7/24 Cefepime 7/24 >> Vancomycin 07/22 >>  Microbiology results: 07/21 BCx: NGTD 07/21 UCx: multiple species Elbow cx: + WBC, no organsism  Thank you for allowing pharmacy to be a part of this patient's care.  Chinita Greenland PharmD Clinical Pharmacist 10/30/2015 9:03 AM

## 2015-10-30 NOTE — Progress Notes (Signed)
PT Cancellation Note  Patient Details Name: ELREE BOENING MRN: [id number] DOB: January 09, 1961   Cancelled Treatment:    Reason Eval/Treat Not Completed: Patient declined, no reason specified;Other (comment). Treatment attempted as initial chart review revealed pt awaiting bed transfer to [facility], but bed not available. Pt refuses treatment noting he is feeling better and going home; pt states he is fine to walk and manage steps into the home now that pain has resolved in foot and arm. Checked with CM for updated information. CM notes per MD at rounding this morn, pt no longer transferring, but not discharging either due to temperature last night and elevated heart rate this morn. Offered PT again due to new information, pt up independently heading to the bathroom and refuses again. Re attempt treatment tomorrow based on appropriateness/discharge plan.    Charlaine Dalton, [place] 10/30/2015, 11:42 AM

## 2015-10-30 NOTE — Progress Notes (Signed)
Subjective:  Creatinine down to 2.37 today. Patient in good spirits. He had aspiration of his right elbow joint yesterday. This revealed intracellular monosodium urate crystals. In addition there was inflammatory response as there were 19,000 WBCs noted within the synovial fluid.  Objective:  Vital signs in last 24 hours:  Temp:  [97.7 F (36.5 C)-102.8 F (39.3 C)] 98.4 F (36.9 C) (07/26 0900) Pulse Rate:  [55-109] 109 (07/26 0900) Resp:  [17-21] 18 (07/26 0900) BP: (94-135)/(49-73) 110/61 (07/26 0900) SpO2:  [91 %-95 %] 93 % (07/26 0900) Weight:  [78.6 kg (173 lb 3.2 oz)] 78.6 kg (173 lb 3.2 oz) (07/26 0438)  Weight change: -4.813 kg (-10 lb 9.8 oz) Filed Weights   10/27/15 0500 10/29/15 0455 10/30/15 0438  Weight: 84.1 kg (185 lb 6.4 oz) 83.4 kg (183 lb 13 oz) 78.6 kg (173 lb 3.2 oz)    Intake/Output:    Intake/Output Summary (Last 24 hours) at 10/30/15 1113 Last data filed at 10/30/15 0900  Gross per 24 hour  Intake              650 ml  Output             1500 ml  Net             -850 ml     Physical Exam: General: No acute distress  HEENT anicteric, moist mucous membranes  Neck supple  Pulm/lungs Normal breathing effort, clear to auscultation bilaterally  CVS/Heart Regular, no rub or gallop  Abdomen:  Soft, nontender  Extremities:  no peripheral edema, right elbow swelling noted  Neurologic: Alert, oriented, follows commands  Skin: no acute rashes          Basic Metabolic Panel:   Recent Labs Lab 10/24/15 0359 10/25/15 0512 10/26/15 0507  10/26/15 1507 10/26/15 1747 10/27/15 0623 10/27/15 1452 10/28/15 0916 10/29/15 0451 10/29/15 2002 10/30/15 0520  NA 141  141 138 138  --   --   --  140  --  138 138  --   --   K 3.0*  2.9* 2.9* 2.9*  < >  --  2.8* 2.9* 3.6 3.3* 3.4*  --   --   CL 106  106 105 104  --   --   --  105  --  106 105  --   --   CO2 24  24 23 22   --   --   --  23  --  20* 22  --   --   GLUCOSE 116*  116* 128* 97  --   --    --  105*  --  102* 100*  --   --   BUN 43*  43* 45* 51*  --   --   --  40*  --  33* 29*  --   --   CREATININE 8.14*  8.35* 7.31* 6.72*  --   --   --  5.23*  --  4.16* 3.34* 2.82* 2.37*  CALCIUM 6.4*  6.4* 5.8* 5.4*  --   --   --  5.6*  --  6.2* 6.0*  --   --   MG  --   --  0.9*  < > 2.0 1.9 1.8  --   --  1.3*  --  1.8  PHOS 3.1  --   --   --   --   --   --   --   --   --   --   --   < > =  values in this interval not displayed.   CBC:  Recent Labs Lab 10/24/15 0359 10/26/15 0507 10/26/15 1507 10/28/15 0916 10/29/15 0451  WBC 12.0* 11.7*  --  7.2 6.6  NEUTROABS  --  9.0*  --  5.0  --   HGB 7.7* 6.7* 8.5* 8.3* 8.9*  HCT 23.0* 20.1* 24.5* 24.7* 25.9*  MCV 94.1 95.6  --  94.6 94.9  PLT 172 138*  --  159 159      Microbiology:  Recent Results (from the past 720 hour(s))  Giardia/Cryptosporidium EIA     Status: None   Collection Time: 10/22/15  1:40 AM  Result Value Ref Range Status   Giardia Ag, Stl Negative Negative Final    Comment: (NOTE) Performed At: [facility] [place], [place] [id number] Lindon Romp MD [id number]    Cryptosporidium EIA Negative Negative Final    Comment: (NOTE) Performed At: [facility] [place], [place] [id number] Lindon Romp MD [id number]   CULTURE, BLOOD (ROUTINE X 2) w Reflex to ID Panel     Status: None   Collection Time: 10/25/15 10:15 AM  Result Value Ref Range Status   Specimen Description BLOOD LEFT HAND  Final   Special Requests   Final    BOTTLES DRAWN AEROBIC AND ANAEROBIC  AEROBIC 3CC, ANAEROBIC 1CC   Culture NO GROWTH 5 DAYS  Final   Report Status 10/30/2015 FINAL  Final  CULTURE, BLOOD (ROUTINE X 2) w Reflex to ID Panel     Status: None   Collection Time: 10/25/15 10:16 AM  Result Value Ref Range Status   Specimen Description BLOOD LEFT ASSIST CONTROL  Final   Special Requests BOTTLES DRAWN AEROBIC AND ANAEROBIC [place]  Final   Culture NO GROWTH 5 DAYS  Final    Report Status 10/30/2015 FINAL  Final  Urine culture     Status: Abnormal   Collection Time: 10/25/15  6:32 PM  Result Value Ref Range Status   Specimen Description URINE, CLEAN CATCH  Final   Special Requests Normal  Final   Culture MULTIPLE SPECIES PRESENT, SUGGEST RECOLLECTION (A)  Final   Report Status 10/27/2015 FINAL  Final  Body fluid culture     Status: None   Collection Time: 10/26/15  4:32 PM  Result Value Ref Range Status   Specimen Description SYNOVIAL ELBOW  Final   Special Requests Normal  Final   Gram Stain   Final    ABUNDANT WBC PRESENT, PREDOMINANTLY PMN NO ORGANISMS SEEN    Culture   Final    No growth aerobically or anaerobically. Performed at [facility]    Report Status 10/29/2015 FINAL  Final  Body fluid culture     Status: None (Preliminary result)   Collection Time: 10/29/15  6:01 PM  Result Value Ref Range Status   Specimen Description SYNOVIAL  Final   Special Requests RIGHT ELBOW  Final   Gram Stain   Final    NO ORGANISMS SEEN MODERATE WBC PRESENT, PREDOMINANTLY PMN Performed at [facility]    Culture PENDING  Incomplete   Report Status PENDING  Incomplete    Coagulation Studies: No results for input(s): LABPROT, INR in the last 72 hours.  Urinalysis: No results for input(s): COLORURINE, LABSPEC, PHURINE, GLUCOSEU, HGBUR, BILIRUBINUR, KETONESUR, PROTEINUR, UROBILINOGEN, NITRITE, LEUKOCYTESUR in the last 72 hours.  Invalid input(s): APPERANCEUR    Imaging: Mr Foot Right Wo Contrast  Result Date: 10/29/2015 CLINICAL DATA:  Right  foot edema and erythema along the dorsal aspect extending into the right ankle. EXAM: MRI OF THE RIGHT FOREFOOT WITHOUT CONTRAST TECHNIQUE: Multiplanar, multisequence MR imaging was performed. No intravenous contrast was administered. COMPARISON:  None. FINDINGS: TENDONS Peroneal: Peroneal longus tendon intact. Longitudinal split tear of the proximal peroneus brevis tendon. Mild peroneal tenosynovitis.  Posteromedial: Posterior tibial tendon intact. Flexor hallucis longus tendon intact. Flexor digitorum longus tendon intact. Anterior: Tibialis anterior tendon intact. Extensor hallucis longus tendon intact Extensor digitorum longus tendon intact. Achilles:  Intact. Plantar Fascia: Intact. LIGAMENTS Lateral: Anterior talofibular ligament intact. Posterior talofibular ligament intact. Anterior and posterior tibiofibular ligaments intact. Medial: Deltoid ligament intact. Spring ligament intact. CARTILAGE Ankle Joint: No joint effusion. Normal ankle mortise. No chondral defect. Subtalar Joints/Sinus Tarsi: Normal subtalar joints. No subtalar joint effusion. Normal sinus tarsi. Bones: No marrow signal abnormality.  No fracture or dislocation. Soft Tissue: Severe soft tissue edema in the subcutaneous fat along the dorsal aspect of the forefoot, midfoot and extending into the ankle most concerning for severe cellulitis. No focal fluid collection or hematoma. IMPRESSION: 1. Severe soft tissue edema in the subcutaneous fat along the dorsal aspect of the forefoot, midfoot and extending into the ankle most concerning for severe cellulitis. No focal fluid collection or hematoma. 2. No osteomyelitis of the right foot. Electronically Signed   By: Kathreen Devoid   On: 10/29/2015 11:08  Dg Foot Complete Right  Result Date: 10/28/2015 CLINICAL DATA:  Redness and swelling of the right foot. History of gout and peripheral vascular disease. EXAM: RIGHT FOOT COMPLETE - 3+ VIEW COMPARISON:  None. FINDINGS: There is no evidence of fracture or dislocation. There is no evidence of arthropathy or other focal bone abnormality. No bony erosions are seen. Soft tissues demonstrate mid from dorsal swelling. IMPRESSION: No acute fracture or dislocation identified about the right foot. No radiographic evidence of osteomyelitis or stigmata of gouty arthritis. Electronically Signed   By: Fidela Salisbury M.D.   On: 10/28/2015  13:49    Medications:     . calcium carbonate  500 mg of elemental calcium Oral BID WC  . cephALEXin  250 mg Oral Q8H  . diltiazem  120 mg Oral Daily  . docusate sodium  100 mg Oral BID  . folic acid  1 mg Oral Daily  . metoprolol  200 mg Oral Daily  . multivitamin with minerals  1 tablet Oral Daily  . omega-3 acid ethyl esters  4 g Oral Daily  . pantoprazole (PROTONIX) IV  40 mg Intravenous Q12H  . potassium chloride  40 mEq Oral Daily  . predniSONE  20 mg Oral Q breakfast  . rosuvastatin  40 mg Oral QPM  . sodium bicarbonate  1,300 mg Oral BID  . sodium chloride flush  3 mL Intravenous Q12H  . tamsulosin  0.4 mg Oral Daily  . thiamine  100 mg Oral Daily  . vitamin B-12  2,000 mcg Oral Daily   acetaminophen **OR** acetaminophen, morphine injection, ondansetron **OR** ondansetron (ZOFRAN) IV, oxyCODONE  Assessment/ Plan:  55 y.o. male originally from [place], history of alcohol abuse, hepatitis, peripheral vascular disease, hypertension, hyperlipidemia, BPH, admitted to [facility] on July 15 for acute renal failure   1. Acute Renal Failure: baseline creatinine of 1 on 06/07/15 2. Hyperkalemia, now hypokalemia 3. Metabolic acidosis 4. Anemia with renal failure: 2 units PRBC on 7/15  5. Hypocalcemia: calcium of 6.2 6. Acute pancreatitis 7.  Hyperlipidemia, severe 8. Fever - T max 102.8 right  elbow gout.  Serological studies have been negative so far.  Urine protein/creatine ratio is 0.6. Urine microscopic exam performed by Dr. Juleen China showed muddy brown cast suggesting ATN.  Plan:  Patient continues to improve in terms of his renal function. Creatinine down to 2.37. He had an acute gouty attack in the right elbow.  He is currently on prednisone 20 mg by mouth daily for this issue.  He is on potassium chloride 40 mEq by mouth daily for the recent hypokalemia. We also advised him to liberalize his potassium in his diet when he goes home. We plan to see him back in the  office next week for labs.   LOS: [place] 7/26/201711:13 AM

## 2015-10-30 NOTE — Progress Notes (Signed)
Subjective:  Patient seen in his hospital bed today. He states his right elbow pain is much improved. He has noted improvement in his range of motion as well. Right elbow aspirate obtained last night has demonstrated monosodium urate crystals consistent with gout. Patient's cell count was 19,000. He had white cells but no organisms on Gram stain. His previous elbow aspirate had also show no organisms with white cells present. The final culture was negative on his right elbow from his first aspiration. Patient's right lower leg is also improved significantly. His MRI had shown no abscess but significant soft tissue edema over the dorsal foot and ankle. Patient was started on oral steroids overnight.  Objective:   VITALS:   Vitals:   10/30/15 0438 10/30/15 0444 10/30/15 0900 10/30/15 1225  BP:  (!) 94/49 110/61 107/62  Pulse:  72 (!) 109 74  Resp:  (!) 21 18 16   Temp:  97.7 F (36.5 C) 98.4 F (36.9 C) 98.3 F (36.8 C)  TempSrc:  Oral Oral Oral  SpO2:  94% 93% 97%  Weight: 78.6 kg (173 lb 3.2 oz)     Height:        PHYSICAL EXAM:  Right elbow: Patient's range of motion today is -10-15 of full extension to approximately 140 of flexion.  He has 90 of pronation supination forearm. He has a sensation light touch throughout the right upper extremity. His finger is well-perfused with palpable radial pulse. He has minimal tenderness to palpation.  Right foot/ankle: Patient's erythema and edema are dramatically improved. There is only faint erythema remaining. He has palpable pedal pulses. He has full ankle and toe range of motion without pain. He has minimal tenderness to palpation and intact sensation light touch.    LABS  Results for orders placed or performed during the hospital encounter of 10/19/15 (from the past 24 hour(s))  Synovial cell count + diff, w/ crystals     Status: Abnormal   Collection Time: 10/29/15  6:01 PM  Result Value Ref Range   Color, Synovial PINK (A) YELLOW    Appearance-Synovial HAZY (A) CLEAR   Crystals, Fluid INTRACELLULAR MONOSODIUM URATE CRYSTALS    WBC, Synovial 19,204 (H) 0 - 200 /cu mm   Neutrophil, Synovial 81 %   Lymphocytes-Synovial Fld 10 %   Monocyte-Macrophage-Synovial Fluid 9 %   Eosinophils-Synovial 0 %   Other Cells-SYN 0   Body fluid culture     Status: None (Preliminary result)   Collection Time: 10/29/15  6:01 PM  Result Value Ref Range   Specimen Description SYNOVIAL    Special Requests RIGHT ELBOW    Gram Stain      NO ORGANISMS SEEN MODERATE WBC PRESENT, PREDOMINANTLY PMN Performed at [facility]    Culture PENDING    Report Status PENDING   Vancomycin, trough     Status: None   Collection Time: 10/29/15  8:02 PM  Result Value Ref Range   Vancomycin Tr 18 15 - 20 ug/mL  Creatinine, serum     Status: Abnormal   Collection Time: 10/29/15  8:02 PM  Result Value Ref Range   Creatinine, Ser 2.82 (H) 0.61 - 1.24 mg/dL   GFR calc non Af Amer 23 (L) >60 mL/min   GFR calc Af Amer 27 (L) >60 mL/min  Magnesium     Status: None   Collection Time: 10/30/15  5:20 AM  Result Value Ref Range   Magnesium 1.8 1.7 - 2.4 mg/dL  Creatinine, serum  Status: Abnormal   Collection Time: 10/30/15  5:20 AM  Result Value Ref Range   Creatinine, Ser 2.37 (H) 0.61 - 1.24 mg/dL   GFR calc non Af Amer 29 (L) >60 mL/min   GFR calc Af Amer 34 (L) >60 mL/min    Mr Foot Right Wo Contrast  Result Date: 10/29/2015 CLINICAL DATA:  Right foot edema and erythema along the dorsal aspect extending into the right ankle. EXAM: MRI OF THE RIGHT FOREFOOT WITHOUT CONTRAST TECHNIQUE: Multiplanar, multisequence MR imaging was performed. No intravenous contrast was administered. COMPARISON:  None. FINDINGS: TENDONS Peroneal: Peroneal longus tendon intact. Longitudinal split tear of the proximal peroneus brevis tendon. Mild peroneal tenosynovitis. Posteromedial: Posterior tibial tendon intact. Flexor hallucis longus tendon intact. Flexor  digitorum longus tendon intact. Anterior: Tibialis anterior tendon intact. Extensor hallucis longus tendon intact Extensor digitorum longus tendon intact. Achilles:  Intact. Plantar Fascia: Intact. LIGAMENTS Lateral: Anterior talofibular ligament intact. Posterior talofibular ligament intact. Anterior and posterior tibiofibular ligaments intact. Medial: Deltoid ligament intact. Spring ligament intact. CARTILAGE Ankle Joint: No joint effusion. Normal ankle mortise. No chondral defect. Subtalar Joints/Sinus Tarsi: Normal subtalar joints. No subtalar joint effusion. Normal sinus tarsi. Bones: No marrow signal abnormality.  No fracture or dislocation. Soft Tissue: Severe soft tissue edema in the subcutaneous fat along the dorsal aspect of the forefoot, midfoot and extending into the ankle most concerning for severe cellulitis. No focal fluid collection or hematoma. IMPRESSION: 1. Severe soft tissue edema in the subcutaneous fat along the dorsal aspect of the forefoot, midfoot and extending into the ankle most concerning for severe cellulitis. No focal fluid collection or hematoma. 2. No osteomyelitis of the right foot. Electronically Signed   By: Kathreen Devoid   On: 10/29/2015 11:08  Dg Foot Complete Right  Result Date: 10/28/2015 CLINICAL DATA:  Redness and swelling of the right foot. History of gout and peripheral vascular disease. EXAM: RIGHT FOOT COMPLETE - 3+ VIEW COMPARISON:  None. FINDINGS: There is no evidence of fracture or dislocation. There is no evidence of arthropathy or other focal bone abnormality. No bony erosions are seen. Soft tissues demonstrate mid from dorsal swelling. IMPRESSION: No acute fracture or dislocation identified about the right foot. No radiographic evidence of osteomyelitis or stigmata of gouty arthritis. Electronically Signed   By: Fidela Salisbury M.D.   On: 10/28/2015 13:49   Assessment/Plan: 8 Days Post-Op   Active Problems:   AKI (acute kidney injury) ([place])   Absolute  anemia   Other acute pancreatitis   Acute blood loss anemia   Esophagitis, unspecified   Portal hypertension ([place])  Patient's aspiration confirms the presence of gout in the right elbow. Patient is a history of gout. The cell count was also 19,000 which does not suggest a septic right elbow joint. Surgery has been canceled on his right elbow. I do not feel these results warrant an open washout of the right elbow. The results of his aspirate are not suggestive of a septic right elbow joint. Clinically the patient is significantly improved. Patient is also had dramatic improvement in his right lower leg cellulitis. The MRI of the right leg confirmed cellulitis and not an abscess or septic ankle joint. Patient may be discharged from an orthopedic standpoint if cleared by medicine. I will defer to medicine regarding antibiotic treatment. I would complete a course of oral steroids for treatment of the right elbow gout. Patient may follow-up in my office if he continues to have pain in the right elbow after  he completes his steroid course. I've discussed this case with Dr. Earleen Newport, the hospitalist.    Thornton Park , MD 10/30/2015, 12:52 PM

## 2015-10-30 NOTE — Discharge Summary (Signed)
[place] [facility] [place] NAME: [name] [name]    MR#:  [id number]  DATE OF BIRTH:  [birth year]  DATE OF ADMISSION:  10/19/2015 ADMITTING PHYSICIAN: Max Sane, MD  DATE OF DISCHARGE: 10/30/2015  3:00 PM  PRIMARY CARE PHYSICIAN: Kirk Ruths., MD    ADMISSION DIAGNOSIS:  Pure hyperglyceridemia [E78.1] Hyperkalemia [E87.5] Peripheral blood vessel disorder (HCC) [I73.9] Acute renal failure, unspecified acute renal failure type ([place]) [N17.9] Anemia, unspecified anemia type [D64.9]  DISCHARGE DIAGNOSIS:  Active Problems:   AKI (acute kidney injury) ([place])   Absolute anemia   Other acute pancreatitis   Acute blood loss anemia   Esophagitis, unspecified   Portal hypertension ([place])   SECONDARY DIAGNOSIS:   Past Medical History:  Diagnosis Date  . Chest pain    Unspecified  . Chronic kidney disease   . Hypertension   . Hypertriglyceridemia   . Kidney stone on left side   . Palpitations   . Peripheral vascular disease ([place])     HOSPITAL COURSE:   1. Suspected sepsis. This had been ruled out. Blood cultures negative. Right elbow culture negative. Dialysis and central line catheters removed. Patient did have a fever last night. But likely secondary to gout flare. Has not had a fever since receiving steroids last night. Patient did not want to watch his temperature curve another day and wanted to leave the hospital. I did give Keflex for right foot cellulitis to complete a course. 2. Acute gout right elbow bursa and elbow. Patient had aspiration by Dr. Mack Guise on 2 occasions during the hospital course. Crystals seen on the last aspiration. Improved since starting steroids yesterday. Continue a steroid taper as outpatient. Patient hesitant on this. 3. Acute kidney injury secondary to ATN. Patient received temporary hemodialysis during the hospital course. Patient now making good urine and creatinine improving. Creatinine upon discharge  2.37. Creatinine peaked at 18.79. Follow-up with nephrology as outpatient. 4. Chronic GI bleed and anemia. EGD on 10/22/2005 showed gastritis and mild portal hypertensive gastropathy. Patient received 1 unit of packed red blood cells. Aspirin and Plavix held during the hospitalization can be continued as outpatient. Last hemoglobin 8.9. 5. Acute pancreatitis. This has resolved. Lipase only slightly elevated. 6. Hypokalemia and hypomagnesemia. These were replaced during the hospital course. 7. Essential hypertension. Continue metoprolol and diltiazem. Lisinopril and hydrochlorothiazide were stopped secondary to acute kidney injury. Clonidine also stopped secondary to relative hypotension. 8. BPH on Flomax 9. Hyperlipidemia on Crestor 10. History of kidney stones nonobstructive can follow up with urology as outpatient.  Again patient did not want to stay in the hospital another day to watch his temperature curve. Follow up closely as outpatient.  DISCHARGE CONDITIONS:   Satisfactory  CONSULTS OBTAINED:  Treatment Team:  Lavonia Dana, MD Thornton Park, MD Murlean Iba, MD Sharlotte [place], DPM  DRUG ALLERGIES:   Allergies  Allergen Reactions  . Fenofibrate Other (See Comments)    DISCHARGE MEDICATIONS:   Discharge Medication List as of 10/30/2015  2:03 PM    START taking these medications   Details  cephALEXin (KEFLEX) 250 MG capsule Take 1 capsule (250 mg total) by mouth every 8 (eight) hours., Starting Wed 10/30/2015, Print    predniSONE (DELTASONE) 5 MG tablet 4 tablets day1; 3 tablets day2; 2 tablets day3; 1 tablet day4,5 and 6, Print    thiamine 100 MG tablet Take 1 tablet (100 mg total) by mouth daily., Starting Wed 10/30/2015, Print      CONTINUE these  medications which have CHANGED   Details  HYDROcodone-acetaminophen (NORCO/VICODIN) 5-325 MG tablet Take 1 tablet by mouth every 6 (six) hours as needed for moderate pain., Starting Wed 10/30/2015, Print      CONTINUE  these medications which have NOT CHANGED   Details  aspirin EC 81 MG tablet Take 81 mg by mouth daily., Until Discontinued, Historical Med    clopidogrel (PLAVIX) 75 MG tablet Take 75 mg by mouth daily., Until Discontinued, Historical Med    diltiazem (CARDIZEM CD) 120 MG 24 hr capsule Take 120 mg by mouth daily. , Until Discontinued, Historical Med    metoprolol (TOPROL-XL) 200 MG 24 hr tablet Take 200 mg by mouth daily. , Until Discontinued, Historical Med    omega-3 acid ethyl esters (LOVAZA) 1 G capsule Take 4 g by mouth daily. , Until Discontinued, Historical Med    pantoprazole (PROTONIX) 40 MG tablet Take 40 mg by mouth daily., Until Discontinued, Historical Med    rosuvastatin (CRESTOR) 40 MG tablet Take 40 mg by mouth every evening., Until Discontinued, Historical Med    vitamin B-12 (CYANOCOBALAMIN) 1000 MCG tablet Take 2,000 mcg by mouth daily., Until Discontinued, Historical Med    tamsulosin (FLOMAX) 0.4 MG CAPS capsule Take 1 capsule (0.4 mg total) by mouth daily., Starting 09/13/2014, Until Discontinued, Print      STOP taking these medications     lisinopril-hydrochlorothiazide (PRINZIDE,ZESTORETIC) 20-12.5 MG per tablet          DISCHARGE INSTRUCTIONS:   Follow-up PMD one week Follow-up with Dr. Mack Guise orthopedic surgery Follow-up with nephrology  If you experience worsening of your admission symptoms, develop shortness of breath, life threatening emergency, suicidal or homicidal thoughts you must seek medical attention immediately by calling 911 or calling your MD immediately  if symptoms less severe.  You Must read complete instructions/literature along with all the possible adverse reactions/side effects for all the Medicines you take and that have been prescribed to you. Take any new Medicines after you have completely understood and accept all the possible adverse reactions/side effects.   Please note  You were cared for by a hospitalist during your  hospital stay. If you have any questions about your discharge medications or the care you received while you were in the hospital after you are discharged, you can call the unit and asked to speak with the hospitalist on call if the hospitalist that took care of you is not available. Once you are discharged, your primary care physician will handle any further medical issues. Please note that NO REFILLS for any discharge medications will be authorized once you are discharged, as it is imperative that you return to your primary care physician (or establish a relationship with a primary care physician if you do not have one) for your aftercare needs so that they can reassess your need for medications and monitor your lab values.    Today   CHIEF COMPLAINT:   Chief Complaint  Patient presents with  . Abnormal Lab    HISTORY OF PRESENT ILLNESS:  [name] [name]  is a 55 y.o. male sent in with abnormal creatinine   VITAL SIGNS:  Blood pressure 107/62, pulse 74, temperature 98 F (36.7 C), temperature source Oral, resp. rate 16, height 6' (1.829 m), weight 78.6 kg (173 lb 3.2 oz), SpO2 97 %.    PHYSICAL EXAMINATION:  GENERAL:  55 y.o.-year-old patient lying in the bed with no acute distress.  EYES: Pupils equal, round, reactive to light and accommodation. No  scleral icterus. Extraocular muscles intact.  HEENT: Head atraumatic, normocephalic. Oropharynx and nasopharynx clear.  NECK:  Supple, no jugular venous distention. No thyroid enlargement, no tenderness.  LUNGS: Normal breath sounds bilaterally, no wheezing, rales,rhonchi or crepitation. No use of accessory muscles of respiration.  CARDIOVASCULAR: S1, S2 normal. No murmurs, rubs, or gallops.  ABDOMEN: Soft, non-tender, non-distended. Bowel sounds present. No organomegaly or mass.  EXTREMITIES: Left elbow swelling and decreased range of motion. cyanosis, or clubbing.  NEUROLOGIC: Cranial nerves II through XII are intact. Muscle strength  5/5 in all extremities. Sensation intact. Gait not checked.  PSYCHIATRIC: The patient is alert and oriented x 3.  SKIN: No rash seen over right elbow. Right ankle no warmth felt. Faded erythema.  DATA REVIEW:   CBC  Recent Labs Lab 10/29/15 0451  WBC 6.6  HGB 8.9*  HCT 25.9*  PLT 159    Chemistries   Recent Labs Lab 10/29/15 0451  10/30/15 0520  NA 138  --   --   K 3.4*  --   --   CL 105  --   --   CO2 22  --   --   GLUCOSE 100*  --   --   BUN 29*  --   --   CREATININE 3.34*  < > 2.37*  CALCIUM 6.0*  --   --   MG 1.3*  --  1.8  < > = values in this interval not displayed.   Microbiology Results  Results for orders placed or performed during the hospital encounter of 10/19/15  Giardia/Cryptosporidium EIA     Status: None   Collection Time: 10/22/15  1:40 AM  Result Value Ref Range Status   Giardia Ag, Stl Negative Negative Final    Comment: (NOTE) Performed At: [facility] [place], [place] [id number] Lindon Romp MD [id number]    Cryptosporidium EIA Negative Negative Final    Comment: (NOTE) Performed At: [facility] [place], [place] [id number] Lindon Romp MD [id number]   CULTURE, BLOOD (ROUTINE X 2) w Reflex to ID Panel     Status: None   Collection Time: 10/25/15 10:15 AM  Result Value Ref Range Status   Specimen Description BLOOD LEFT HAND  Final   Special Requests   Final    BOTTLES DRAWN AEROBIC AND ANAEROBIC  AEROBIC 3CC, ANAEROBIC 1CC   Culture NO GROWTH 5 DAYS  Final   Report Status 10/30/2015 FINAL  Final  CULTURE, BLOOD (ROUTINE X 2) w Reflex to ID Panel     Status: None   Collection Time: 10/25/15 10:16 AM  Result Value Ref Range Status   Specimen Description BLOOD LEFT ASSIST CONTROL  Final   Special Requests BOTTLES DRAWN AEROBIC AND ANAEROBIC [facility]  Final   Culture NO GROWTH 5 DAYS  Final   Report Status 10/30/2015 FINAL  Final  Urine culture     Status: Abnormal   Collection  Time: 10/25/15  6:32 PM  Result Value Ref Range Status   Specimen Description URINE, CLEAN CATCH  Final   Special Requests Normal  Final   Culture MULTIPLE SPECIES PRESENT, SUGGEST RECOLLECTION (A)  Final   Report Status 10/27/2015 FINAL  Final  Body fluid culture     Status: None   Collection Time: 10/26/15  4:32 PM  Result Value Ref Range Status   Specimen Description SYNOVIAL ELBOW  Final   Special Requests Normal  Final   Gram Stain   Final  ABUNDANT WBC PRESENT, PREDOMINANTLY PMN NO ORGANISMS SEEN    Culture   Final    No growth aerobically or anaerobically. Performed at [facility]    Report Status 10/29/2015 FINAL  Final  Body fluid culture     Status: None (Preliminary result)   Collection Time: 10/29/15  6:01 PM  Result Value Ref Range Status   Specimen Description SYNOVIAL  Final   Special Requests RIGHT ELBOW  Final   Gram Stain   Final    NO ORGANISMS SEEN MODERATE WBC PRESENT, PREDOMINANTLY PMN    Culture   Final    NO GROWTH < 24 HOURS Performed at [facility]    Report Status PENDING  Incomplete    RADIOLOGY:  Mr Foot Right Wo Contrast  Result Date: 10/29/2015 CLINICAL DATA:  Right foot edema and erythema along the dorsal aspect extending into the right ankle. EXAM: MRI OF THE RIGHT FOREFOOT WITHOUT CONTRAST TECHNIQUE: Multiplanar, multisequence MR imaging was performed. No intravenous contrast was administered. COMPARISON:  None. FINDINGS: TENDONS Peroneal: Peroneal longus tendon intact. Longitudinal split tear of the proximal peroneus brevis tendon. Mild peroneal tenosynovitis. Posteromedial: Posterior tibial tendon intact. Flexor hallucis longus tendon intact. Flexor digitorum longus tendon intact. Anterior: Tibialis anterior tendon intact. Extensor hallucis longus tendon intact Extensor digitorum longus tendon intact. Achilles:  Intact. Plantar Fascia: Intact. LIGAMENTS Lateral: Anterior talofibular ligament intact. Posterior talofibular  ligament intact. Anterior and posterior tibiofibular ligaments intact. Medial: Deltoid ligament intact. Spring ligament intact. CARTILAGE Ankle Joint: No joint effusion. Normal ankle mortise. No chondral defect. Subtalar Joints/Sinus Tarsi: Normal subtalar joints. No subtalar joint effusion. Normal sinus tarsi. Bones: No marrow signal abnormality.  No fracture or dislocation. Soft Tissue: Severe soft tissue edema in the subcutaneous fat along the dorsal aspect of the forefoot, midfoot and extending into the ankle most concerning for severe cellulitis. No focal fluid collection or hematoma. IMPRESSION: 1. Severe soft tissue edema in the subcutaneous fat along the dorsal aspect of the forefoot, midfoot and extending into the ankle most concerning for severe cellulitis. No focal fluid collection or hematoma. 2. No osteomyelitis of the right foot. Electronically Signed   By: Kathreen Devoid   On: 10/29/2015 11:08   Management plans discussed with the patient, and he wanted to go home today  CODE STATUS:     Code Status Orders        Start     Ordered   10/19/15 0757  Full code  Continuous     10/19/15 0756    Code Status History    Date Active Date Inactive Code Status Order ID Comments User Context   This patient has a current code status but no historical code status.      TOTAL TIME TAKING CARE OF THIS PATIENT: 35  minutes.    Loletha Grayer M.D on 10/30/2015 at 3:27 PM  Between 7am to 6pm - Pager - [phone]  After 6pm go to [email or web address] - password [email or web address]  [facility] Office  [phone]  CC: Primary care physician; Kirk Ruths., MD

## 2015-10-30 NOTE — Progress Notes (Signed)
Patient discharged home with family.  All discharge instructions reviewed and discharge paperwork given to patient.  Patient verbalized understanding.  IV removed in tact. Prescriptions given to patient with all questions and concerns addressed. Patient's wife at bedside for transfer home.  

## 2015-10-31 LAB — PANCREATIC ELASTASE, FECAL

## 2015-10-31 LAB — HEMOCHROMATOSIS DNA-PCR(C282Y,H63D)

## 2015-11-02 LAB — BODY FLUID CULTURE
Culture: NO GROWTH
Gram Stain: NONE SEEN

## 2015-11-05 ENCOUNTER — Telehealth: Payer: Self-pay

## 2015-11-05 NOTE — Telephone Encounter (Signed)
Pt returned call and was notified of lab result. Pt requested a copy of this result to be faxed to [phone].

## 2015-11-05 NOTE — Telephone Encounter (Signed)
LVM for pt to return my call.

## 2015-11-05 NOTE — Telephone Encounter (Signed)
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

## 2015-11-20 ENCOUNTER — Emergency Department: Payer: 59

## 2015-11-20 ENCOUNTER — Emergency Department
Admission: EM | Admit: 2015-11-20 | Discharge: 2015-11-20 | Disposition: A | Discharge status: Home / Self Care | Payer: 59 | Attending: Emergency Medicine | Admitting: Emergency Medicine

## 2015-11-20 ENCOUNTER — Encounter: Payer: Self-pay | Admitting: Emergency Medicine

## 2015-11-20 DIAGNOSIS — Z87891 Personal history of nicotine dependence: Secondary | ICD-10-CM | POA: Insufficient documentation

## 2015-11-20 DIAGNOSIS — N189 Chronic kidney disease, unspecified: Secondary | ICD-10-CM | POA: Insufficient documentation

## 2015-11-20 DIAGNOSIS — Z7982 Long term (current) use of aspirin: Secondary | ICD-10-CM | POA: Diagnosis not present

## 2015-11-20 DIAGNOSIS — N200 Calculus of kidney: Secondary | ICD-10-CM | POA: Diagnosis not present

## 2015-11-20 DIAGNOSIS — Z79899 Other long term (current) drug therapy: Secondary | ICD-10-CM | POA: Insufficient documentation

## 2015-11-20 DIAGNOSIS — I129 Hypertensive chronic kidney disease with stage 1 through stage 4 chronic kidney disease, or unspecified chronic kidney disease: Secondary | ICD-10-CM | POA: Insufficient documentation

## 2015-11-20 DIAGNOSIS — R109 Unspecified abdominal pain: Secondary | ICD-10-CM | POA: Diagnosis present

## 2015-11-20 LAB — COMPREHENSIVE METABOLIC PANEL
ALT: 19 U/L (ref 17–63)
AST: 28 U/L (ref 15–41)
Albumin: 3.8 g/dL (ref 3.5–5.0)
Alkaline Phosphatase: 63 U/L (ref 38–126)
Anion gap: 5 (ref 5–15)
BUN: 24 mg/dL — ABNORMAL HIGH (ref 6–20)
CHLORIDE: 108 mmol/L (ref 101–111)
CO2: 24 mmol/L (ref 22–32)
CREATININE: 1.49 mg/dL — AB (ref 0.61–1.24)
Calcium: 8.7 mg/dL — ABNORMAL LOW (ref 8.9–10.3)
GFR, EST AFRICAN AMERICAN: 59 mL/min — AB (ref >60)
GFR, EST NON AFRICAN AMERICAN: 51 mL/min — AB (ref >60)
Glucose, Bld: 156 mg/dL — ABNORMAL HIGH (ref 65–99)
POTASSIUM: 4.6 mmol/L (ref 3.5–5.1)
Sodium: 137 mmol/L (ref 135–145)
Total Bilirubin: 0.9 mg/dL (ref 0.3–1.2)
Total Protein: 7.4 g/dL (ref 6.5–8.1)

## 2015-11-20 LAB — URINALYSIS COMPLETE WITH MICROSCOPIC (ARMC ONLY)
Bacteria, UA: NONE SEEN
Bilirubin Urine: NEGATIVE
Glucose, UA: NEGATIVE mg/dL
HGB URINE DIPSTICK: NEGATIVE
KETONES UR: NEGATIVE mg/dL
LEUKOCYTES UA: NEGATIVE
NITRITE: NEGATIVE
PH: 7 (ref 5.0–8.0)
PROTEIN: NEGATIVE mg/dL
RBC / HPF: NONE SEEN RBC/hpf (ref 0–5)
SPECIFIC GRAVITY, URINE: 1.014 (ref 1.005–1.030)

## 2015-11-20 LAB — CBC WITH DIFFERENTIAL/PLATELET
BASOS ABS: 0 10*3/uL (ref 0–0.1)
Basophils Relative: 0 %
EOS PCT: 2 %
Eosinophils Absolute: 0.2 10*3/uL (ref 0–0.7)
HCT: 28.7 % — ABNORMAL LOW (ref 40.0–52.0)
Hemoglobin: 9.6 g/dL — ABNORMAL LOW (ref 13.0–18.0)
LYMPHS PCT: 10 %
Lymphs Abs: 1.2 10*3/uL (ref 1.0–3.6)
MCH: 32 pg (ref 26.0–34.0)
MCHC: 33.3 g/dL (ref 32.0–36.0)
MCV: 96 fL (ref 80.0–100.0)
Monocytes Absolute: 0.6 10*3/uL (ref 0.2–1.0)
Monocytes Relative: 5 %
Neutro Abs: 9.3 10*3/uL — ABNORMAL HIGH (ref 1.4–6.5)
Neutrophils Relative %: 83 %
PLATELETS: 132 10*3/uL — AB (ref 150–440)
RBC: 2.99 MIL/uL — AB (ref 4.40–5.90)
RDW: 14.9 % — AB (ref 11.5–14.5)
WBC: 11.3 10*3/uL — AB (ref 3.8–10.6)

## 2015-11-20 LAB — TYPE AND SCREEN
ABO/RH(D): O POS
Antibody Screen: NEGATIVE

## 2015-11-20 LAB — PROTIME-INR
INR: 0.95
PROTHROMBIN TIME: 12.7 s (ref 11.4–15.2)

## 2015-11-20 LAB — ETHANOL

## 2015-11-20 LAB — TROPONIN I: Troponin I: <0.03 ng/mL (ref <0.03)

## 2015-11-20 LAB — LIPASE, BLOOD: Lipase: 46 U/L (ref 11–51)

## 2015-11-20 MED ORDER — SODIUM CHLORIDE 0.9 % IV BOLUS (SEPSIS)
500.0000 mL | Freq: Once | INTRAVENOUS | Status: AC
  Administered 2015-11-20: 500 mL via INTRAVENOUS

## 2015-11-20 MED ORDER — TAMSULOSIN HCL 0.4 MG PO CAPS: 0.4000 mg | ORAL_CAPSULE | Freq: Every day | ORAL | 0 refills | Status: DC

## 2015-11-20 MED ORDER — ONDANSETRON HCL 4 MG PO TABS: 4.0000 mg | ORAL_TABLET | Freq: Every day | ORAL | 0 refills | Status: DC | PRN

## 2015-11-20 MED ORDER — OXYCODONE-ACETAMINOPHEN 5-325 MG PO TABS: 1.0000 | ORAL_TABLET | Freq: Four times a day (QID) | ORAL | 0 refills | Status: DC | PRN

## 2015-11-20 MED ORDER — FENTANYL CITRATE (PF) 100 MCG/2ML IJ SOLN
50.0000 ug | Freq: Once | INTRAMUSCULAR | Status: AC
  Administered 2015-11-20: 50 ug via INTRAVENOUS
  Filled 2015-11-20: qty 2

## 2015-11-20 NOTE — ED Triage Notes (Signed)
Pt to ed with c/o abd pain and weakness also dizziness with standing, recent admission for kidney failure.

## 2015-11-20 NOTE — ED Provider Notes (Addendum)
[facility] Emergency Department Provider Note  ____________________________________________   I have reviewed the triage vital signs and the nursing notes.   HISTORY  Chief Complaint Abdominal Pain    HPI [name] [name] is a 55 y.o. male with a history of recent admission for acute kidney injury in the context of significant alcohol abuse as well as anemia, has had a negative colonoscopy within the last few years and a negative endoscopy recently. Patient also has a history of kidney stones with lithotripsy. He states last night he started having left flank pain going towards the left groin. He is not having pain right now. He does not think it feels exactly like a kidney stone. Given his prior history of multiple different recent significant illnesses, he became "anxious" that something more significant was going on and he came in for further evaluation. He states he felt slightly lightheaded this point. He denies any chest pain. He denies any vomiting or diarrhea. He has not had any melena. He states his stool is slightly dark. However, since physician he is convinced that this is non-melanotic stool. The patient states that he has had no vomiting. The pain is minimal at this time. He does state that he has had no alcohol since July 4.    Past Medical History:  Diagnosis Date  . Chest pain    Unspecified  . Chronic kidney disease   . Hypertension   . Hypertriglyceridemia   . Kidney stone on left side   . Palpitations   . Peripheral vascular disease [facility])     Patient Active Problem List   Diagnosis Date Noted  . Esophagitis, unspecified   . Portal hypertension ([place])   . Absolute anemia   . Other acute pancreatitis   . Acute blood loss anemia   . AKI (acute kidney injury) ([place]) 10/19/2015  . Hepatitis 08/29/2014  . Elevated PSA 08/29/2014  . GERD (gastroesophageal reflux disease) 08/29/2014  . Gross hematuria 08/29/2014  . History of tobacco use  01/13/2014  . Benign essential HTN 09/03/2013  . Peripheral blood vessel disorder ([place]) 09/03/2013  . Palpitations 07/14/2011  . HYPERTRIGLYCERIDEMIA 09/13/2008  . CHEST PAIN-UNSPECIFIED 09/13/2008    Past Surgical History:  Procedure Laterality Date  . ESOPHAGOGASTRODUODENOSCOPY (EGD) WITH PROPOFOL N/A 10/22/2015   Procedure: ESOPHAGOGASTRODUODENOSCOPY (EGD) WITH PROPOFOL;  Surgeon: Lucilla Lame, MD;  Location: [facility] ENDOSCOPY;  Service: Endoscopy;  Laterality: N/A;  . EXTRACORPOREAL SHOCK WAVE LITHOTRIPSY    . EXTRACORPOREAL SHOCK WAVE LITHOTRIPSY Left 09/13/2014   Procedure: EXTRACORPOREAL SHOCK WAVE LITHOTRIPSY (ESWL);  Surgeon: Hollice Espy, MD;  Location: [facility] ORS;  Service: Urology;  Laterality: Left;  . lower extremity interventions x2 right leg Right   . PERIPHERAL VASCULAR CATHETERIZATION Right 08/20/2014   Procedure: Lower Extremity Angiography;  Surgeon: Algernon Huxley, MD;  Location: [place] CV [facility];  Service: Cardiovascular;  Laterality: Right;  . PERIPHERAL VASCULAR CATHETERIZATION Right 08/20/2014   Procedure: Lower Extremity Intervention;  Surgeon: Algernon Huxley, MD;  Location: [place] [facility];  Service: Cardiovascular;  Laterality: Right;    Current Outpatient Rx  . Order #: [id number] Class: Historical Med  . Order #: [id number] Class: Print  . Order #: [id number] Class: Historical Med  . Order #: [id number] Class: Historical Med  . Order #: [id number] Class: Print  . Order #: [id number] Class: Historical Med  . Order #: [id number] Class: Historical Med  . Order #: [id number] Class: Historical Med  . Order #: [id number] Class: Print  . Order #: [id number] Class: Historical Med  .  Order #: [id number] Class: Print  . Order #: [id number] Class: Print  . Order #: [id number] Class: Historical Med    Allergies Fenofibrate  Family History  Problem Relation Age of Onset  . Heart failure      Social History Social History  Substance Use Topics  . Smoking status: Former Smoker     Packs/day: 1.00    Years: 37.00    Quit date: 04/21/2013  . Smokeless tobacco: Never Used  . Alcohol use 8.4 oz/week    7 Glasses of wine, 7 Cans of beer per week    Review of Systems Constitutional: No fever/chills Eyes: No visual changes. ENT: No sore throat. No stiff neck no neck pain Cardiovascular: Denies chest pain. Respiratory: Denies shortness of breath. Gastrointestinal:   no vomiting.  No diarrhea.  No constipation. Genitourinary: Negative for dysuria. Musculoskeletal: Negative lower extremity swelling Skin: Negative for rash. Neurological: Negative for severe headaches, focal weakness or numbness. 10-point ROS otherwise negative.  ____________________________________________   PHYSICAL EXAM:  VITAL SIGNS: ED Triage Vitals  Enc Vitals Group     BP 11/20/15 0731 (!) 143/92     Pulse Rate 11/20/15 0731 77     Resp 11/20/15 0731 18     Temp 11/20/15 0731 97.8 F (36.6 C)     Temp Source 11/20/15 0731 Oral     SpO2 11/20/15 0731 100 %     Weight 11/20/15 0731 172 lb (78 kg)     Height 11/20/15 0731 6' (1.829 m)     Head Circumference --      Peak Flow --      Pain Score 11/20/15 0732 4     Pain Loc --      Pain Edu? --      Excl. in [place]? --     Constitutional: Alert and oriented. Well appearing and in no acute distress. Eyes: Conjunctivae are normal. PERRL. EOMI. Head: Atraumatic. Nose: No congestion/rhinnorhea. Mouth/Throat: Mucous membranes are moist.  Oropharynx non-erythematous. Neck: No stridor.   Nontender with no meningismus Cardiovascular: Normal rate, regular rhythm. Grossly normal heart sounds.  Good peripheral circulation. Respiratory: Normal respiratory effort.  No retractions. Lungs CTAB. Abdominal: Soft and nontender. No distention. No guarding no rebound Back:  There is no focal tenderness or step off.  there is no midline tenderness there are no lesions noted. there is Left CVA tenderness Musculoskeletal: No lower extremity tenderness, no  upper extremity tenderness. No joint effusions, no DVT signs strong distal pulses no edema Neurologic:  Normal speech and language. No gross focal neurologic deficits are appreciated.  Skin:  Skin is warm, dry and intact. No rash noted. Psychiatric: Mood and affect are normal. Speech and behavior are normal.  ____________________________________________   LABS (all labs ordered are listed, but only abnormal results are displayed)  Labs Reviewed  URINALYSIS COMPLETEWITH MICROSCOPIC ([facility] ONLY)  COMPREHENSIVE METABOLIC PANEL  ETHANOL  CBC WITH DIFFERENTIAL/PLATELET  TROPONIN I  LIPASE, BLOOD  PROTIME-INR  TYPE AND SCREEN   ____________________________________________  EKG  I personally interpreted any EKGs ordered by me or triage  ____________________________________________  RADIOLOGY  I reviewed any imaging ordered by me or triage that were performed during my shift and, if possible, patient and/or family made aware of any abnormal findings. ____________________________________________   PROCEDURES  Procedure(s) performed: None  Procedures  Critical Care performed: None  ____________________________________________   INITIAL IMPRESSION / ASSESSMENT AND PLAN / ED COURSE  Pertinent labs & imaging results that were available during my care of  the patient were reviewed by me and considered in my medical decision making (see chart for details).  Patient with flank pain going towards his lower abdomen. Nontoxic in appearance..  Clinical Course   _________ ----------------------------------------- 9:31 AM on 11/20/2015 -----------------------------------------  Patient in no acute distress, creatinine is 1.49 which is his baseline he states. The patient has a reassuring hemoglobin no evidence of GI bleed, and he does have a kidney stone which is very well controlled this time and likely will pass with a measurement of 4 mm. Made aware of all findings on CT scan.  We'll have him follow-up with his urologist. He will also follow up with primary care doctor. ___________________________________   FINAL CLINICAL IMPRESSION(S) / ED DIAGNOSES  Final diagnoses:  Flank pain      This chart was dictated using voice recognition software.  Despite best efforts to proofread,  errors can occur which can change meaning.      Schuyler Amor, MD 11/20/15 [id number]    Schuyler Amor, MD 11/20/15 [phone]

## 2015-12-26 DIAGNOSIS — M1A00X0 Idiopathic chronic gout, unspecified site, without tophus (tophi): Secondary | ICD-10-CM | POA: Insufficient documentation

## 2015-12-26 HISTORY — DX: Idiopathic chronic gout, unspecified site, without tophus (tophi): M1A.00X0

## 2016-02-11 ENCOUNTER — Encounter (INDEPENDENT_AMBULATORY_CARE_PROVIDER_SITE_OTHER): Payer: Self-pay

## 2016-02-11 ENCOUNTER — Ambulatory Visit (INDEPENDENT_AMBULATORY_CARE_PROVIDER_SITE_OTHER): Payer: Self-pay | Admitting: Vascular Surgery

## 2016-03-18 ENCOUNTER — Encounter: Payer: Self-pay | Admitting: *Deleted

## 2016-03-18 ENCOUNTER — Emergency Department: Payer: 59

## 2016-03-18 ENCOUNTER — Emergency Department
Admission: EM | Admit: 2016-03-18 | Discharge: 2016-03-18 | Disposition: A | Discharge status: Home / Self Care | Payer: 59 | Attending: Emergency Medicine | Admitting: Emergency Medicine

## 2016-03-18 DIAGNOSIS — E860 Dehydration: Secondary | ICD-10-CM | POA: Diagnosis not present

## 2016-03-18 DIAGNOSIS — R42 Dizziness and giddiness: Secondary | ICD-10-CM

## 2016-03-18 DIAGNOSIS — N189 Chronic kidney disease, unspecified: Secondary | ICD-10-CM | POA: Diagnosis not present

## 2016-03-18 DIAGNOSIS — Z79899 Other long term (current) drug therapy: Secondary | ICD-10-CM | POA: Insufficient documentation

## 2016-03-18 DIAGNOSIS — Z7982 Long term (current) use of aspirin: Secondary | ICD-10-CM | POA: Insufficient documentation

## 2016-03-18 DIAGNOSIS — I129 Hypertensive chronic kidney disease with stage 1 through stage 4 chronic kidney disease, or unspecified chronic kidney disease: Secondary | ICD-10-CM | POA: Diagnosis not present

## 2016-03-18 DIAGNOSIS — Z87891 Personal history of nicotine dependence: Secondary | ICD-10-CM | POA: Insufficient documentation

## 2016-03-18 LAB — CBC
HEMATOCRIT: 44.6 % (ref 40.0–52.0)
Hemoglobin: 15.2 g/dL (ref 13.0–18.0)
MCH: 31.7 pg (ref 26.0–34.0)
MCHC: 34 g/dL (ref 32.0–36.0)
MCV: 93.2 fL (ref 80.0–100.0)
PLATELETS: 133 10*3/uL — AB (ref 150–440)
RBC: 4.79 MIL/uL (ref 4.40–5.90)
RDW: 12.7 % (ref 11.5–14.5)
WBC: 10.7 10*3/uL — ABNORMAL HIGH (ref 3.8–10.6)

## 2016-03-18 LAB — BASIC METABOLIC PANEL
Anion gap: 15 (ref 5–15)
BUN: 23 mg/dL — AB (ref 6–20)
CHLORIDE: 105 mmol/L (ref 101–111)
CO2: 21 mmol/L — AB (ref 22–32)
CREATININE: 1.08 mg/dL (ref 0.61–1.24)
Calcium: 9.4 mg/dL (ref 8.9–10.3)
GFR calc Af Amer: >60 mL/min (ref >60)
GFR calc non Af Amer: >60 mL/min (ref >60)
GLUCOSE: 176 mg/dL — AB (ref 65–99)
POTASSIUM: 3.7 mmol/L (ref 3.5–5.1)
SODIUM: 141 mmol/L (ref 135–145)

## 2016-03-18 MED ORDER — MECLIZINE HCL 25 MG PO TABS
50.0000 mg | ORAL_TABLET | Freq: Once | ORAL | Status: AC
  Administered 2016-03-18: 50 mg via ORAL
  Filled 2016-03-18 (×2): qty 2

## 2016-03-18 MED ORDER — ONDANSETRON 4 MG PO TBDP: 4.0000 mg | ORAL_TABLET | Freq: Three times a day (TID) | ORAL | 0 refills | Status: DC | PRN

## 2016-03-18 MED ORDER — ONDANSETRON HCL 4 MG/2ML IJ SOLN
4.0000 mg | Freq: Once | INTRAMUSCULAR | Status: AC
  Administered 2016-03-18: 4 mg via INTRAVENOUS
  Filled 2016-03-18: qty 2

## 2016-03-18 MED ORDER — SODIUM CHLORIDE 0.9 % IV BOLUS (SEPSIS)
1000.0000 mL | Freq: Once | INTRAVENOUS | Status: AC
  Administered 2016-03-18: 1000 mL via INTRAVENOUS

## 2016-03-18 MED ORDER — MECLIZINE HCL 25 MG PO TABS: 25.0000 mg | ORAL_TABLET | Freq: Three times a day (TID) | ORAL | 1 refills | Status: DC | PRN

## 2016-03-18 NOTE — ED Provider Notes (Signed)
[facility] Emergency Department Provider Note  ____________________________________________  Time seen: Approximately 6:50 PM  I have reviewed the triage vital signs and the nursing notes.   HISTORY  Chief Complaint Dizziness    HPI [name] [name] is a 55 y.o. male who reports feeling dizzy and off balance with standing and walking today. Also reports he had some vomiting this morning as well associated with the symptoms. Thinks it may be related to [place] food that was 64 days old when he ate it last night. Denies any other unusual symptoms and at bedtime he was feeling normal last night. Taking all his medications. No trauma. No recent illness. No fevers or chills. No chest pain or shortness of breath. No headache neck pain or stiffness. Dizziness worse with change of position and turning of head    Past Medical History:  Diagnosis Date  . Chest pain    Unspecified  . Chronic kidney disease   . Hypertension   . Hypertriglyceridemia   . Kidney stone on left side   . Palpitations   . Peripheral vascular disease [facility])      Patient Active Problem List   Diagnosis Date Noted  . Esophagitis, unspecified   . Portal hypertension ([place])   . Absolute anemia   . Other acute pancreatitis   . Acute blood loss anemia   . AKI (acute kidney injury) ([place]) 10/19/2015  . Hepatitis 08/29/2014  . Elevated PSA 08/29/2014  . GERD (gastroesophageal reflux disease) 08/29/2014  . Gross hematuria 08/29/2014  . History of tobacco use 01/13/2014  . Benign essential HTN 09/03/2013  . Peripheral blood vessel disorder ([place]) 09/03/2013  . Palpitations 07/14/2011  . HYPERTRIGLYCERIDEMIA 09/13/2008  . CHEST PAIN-UNSPECIFIED 09/13/2008     Past Surgical History:  Procedure Laterality Date  . ESOPHAGOGASTRODUODENOSCOPY (EGD) WITH PROPOFOL N/A 10/22/2015   Procedure: ESOPHAGOGASTRODUODENOSCOPY (EGD) WITH PROPOFOL;  Surgeon: Lucilla Lame, MD;  Location: [facility] ENDOSCOPY;   Service: Endoscopy;  Laterality: N/A;  . EXTRACORPOREAL SHOCK WAVE LITHOTRIPSY    . EXTRACORPOREAL SHOCK WAVE LITHOTRIPSY Left 09/13/2014   Procedure: EXTRACORPOREAL SHOCK WAVE LITHOTRIPSY (ESWL);  Surgeon: Hollice Espy, MD;  Location: [facility] ORS;  Service: Urology;  Laterality: Left;  . lower extremity interventions x2 right leg Right   . PERIPHERAL VASCULAR CATHETERIZATION Right 08/20/2014   Procedure: Lower Extremity Angiography;  Surgeon: Algernon Huxley, MD;  Location: [place] [facility];  Service: Cardiovascular;  Laterality: Right;  . PERIPHERAL VASCULAR CATHETERIZATION Right 08/20/2014   Procedure: Lower Extremity Intervention;  Surgeon: Algernon Huxley, MD;  Location: [place] [facility];  Service: Cardiovascular;  Laterality: Right;     Prior to Admission medications   Medication Sig Start Date End Date Taking? Authorizing Provider  aspirin EC 81 MG tablet Take 81 mg by mouth daily.    Historical Provider, MD  clopidogrel (PLAVIX) 75 MG tablet Take 75 mg by mouth daily.    Historical Provider, MD  diltiazem (CARDIZEM CD) 120 MG 24 hr capsule Take 120 mg by mouth daily.     Historical Provider, MD  HYDROcodone-acetaminophen (NORCO/VICODIN) 5-325 MG tablet Take 1 tablet by mouth every 6 (six) hours as needed for moderate pain. 10/30/15   Loletha Grayer, MD  meclizine (ANTIVERT) 25 MG tablet Take 1 tablet (25 mg total) by mouth 3 (three) times daily as needed for dizziness or nausea. May take 2 tablets at a time if 1 tablet is ineffective. 03/18/16   Carrie Mew, MD  metoprolol (TOPROL-XL) 200 MG  24 hr tablet Take 200 mg by mouth daily.     Historical Provider, MD  omega-3 acid ethyl esters (LOVAZA) 1 G capsule Take 4 g by mouth daily.     Historical Provider, MD  ondansetron (ZOFRAN ODT) 4 MG disintegrating tablet Take 1 tablet (4 mg total) by mouth every 8 (eight) hours as needed for nausea or vomiting. 03/18/16   Carrie Mew, MD  ondansetron (ZOFRAN) 4 MG tablet Take 1 tablet (4 mg  total) by mouth daily as needed for nausea or vomiting. 11/20/15   Schuyler Amor, MD  oxyCODONE-acetaminophen (ROXICET) 5-325 MG tablet Take 1 tablet by mouth every 6 (six) hours as needed. 11/20/15   Schuyler Amor, MD  pantoprazole (PROTONIX) 40 MG tablet Take 40 mg by mouth daily.    Historical Provider, MD  rosuvastatin (CRESTOR) 40 MG tablet Take 40 mg by mouth every evening.    Historical Provider, MD  tamsulosin (FLOMAX) 0.4 MG CAPS capsule Take 1 capsule (0.4 mg total) by mouth daily. 11/20/15   Schuyler Amor, MD  thiamine 100 MG tablet Take 1 tablet (100 mg total) by mouth daily. Patient not taking: Reported on 11/20/2015 10/30/15   Loletha Grayer, MD  vitamin B-12 (CYANOCOBALAMIN) 1000 MCG tablet Take 2,000 mcg by mouth daily.    Historical Provider, MD     Allergies Fenofibrate   Family History  Problem Relation Age of Onset  . Heart failure      Social History Social History  Substance Use Topics  . Smoking status: Former Smoker    Packs/day: 1.00    Years: 37.00    Quit date: 04/21/2013  . Smokeless tobacco: Never Used  . Alcohol use 8.4 oz/week    7 Glasses of wine, 7 Cans of beer per week    Review of Systems  Constitutional:   No fever or chills.  ENT:   No sore throat. No rhinorrhea. Cardiovascular:   No chest pain. Respiratory:   No dyspnea or cough. Gastrointestinal:   Negative for abdominal pain, vomiting and diarrhea. Positive nausea Genitourinary:   Negative for dysuria or difficulty urinating. Musculoskeletal:   Negative for focal pain or swelling Neurological:   Negative for headaches. Positive dizziness. 10-point ROS otherwise negative.  ____________________________________________   PHYSICAL EXAM:  VITAL SIGNS: ED Triage Vitals  Enc Vitals Group     BP 03/18/16 1256 (!) 149/81     Pulse Rate 03/18/16 1256 67     Resp 03/18/16 1256 18     Temp 03/18/16 1256 97.7 F (36.5 C)     Temp Source 03/18/16 1256 Oral     SpO2 03/18/16 1256 100  %     Weight 03/18/16 1257 195 lb (88.5 kg)     Height 03/18/16 1257 6' (1.829 m)     Head Circumference --      Peak Flow --      Pain Score 03/18/16 1515 0     Pain Loc --      Pain Edu? --      Excl. in [place]? --     Vital signs reviewed, nursing assessments reviewed.   Constitutional:   Alert and oriented. Well appearing and in no distress. Eyes:   No scleral icterus. No conjunctival pallor. PERRL. EOMI.  No nystagmus. ENT   Head:   Normocephalic and atraumatic.   Nose:   No congestion/rhinnorhea. No septal hematoma   Mouth/Throat:   Dry mucous membranes, no pharyngeal erythema. No peritonsillar mass.  Neck:   No stridor. No SubQ emphysema. No meningismus. Hematological/Lymphatic/Immunilogical:   No cervical lymphadenopathy. Cardiovascular:   RRR, Heart rate increases from 70-100 moving from supine to sitting.. Symmetric bilateral radial and DP pulses.  No murmurs.  Respiratory:   Normal respiratory effort without tachypnea nor retractions. Breath sounds are clear and equal bilaterally. No wheezes/rales/rhonchi. Gastrointestinal:   Soft and nontender. Non distended. There is no CVA tenderness.  No rebound, rigidity, or guarding. Genitourinary:   deferred Musculoskeletal:   Nontender with normal range of motion in all extremities. No joint effusions.  No lower extremity tenderness.  No edema. Neurologic:   Normal speech and language.  CN 2-10 normal. Motor grossly intact. No gross focal neurologic deficits are appreciated.  Skin:    Skin is warm, dry and intact. No rash noted.  No petechiae, purpura, or bullae.  ____________________________________________    LABS (pertinent positives/negatives) (all labs ordered are listed, but only abnormal results are displayed) Labs Reviewed  BASIC METABOLIC PANEL - Abnormal; Notable for the following:       Result Value   CO2 21 (*)    Glucose, Bld 176 (*)    BUN 23 (*)    All other components within normal limits  CBC  - Abnormal; Notable for the following:    WBC 10.7 (*)    Platelets 133 (*)    All other components within normal limits  URINALYSIS, COMPLETE (UACMP) WITH MICROSCOPIC  CBG MONITORING, ED   ____________________________________________   EKG  Interpreted by me Sinus rhythm rate of 90, normal axis, normal intervals. Normal QRS ST segments and T waves. 3 PVCs on the strip.  ____________________________________________    RADIOLOGY  CT head unremarkable  ____________________________________________   PROCEDURES Procedures  ____________________________________________   INITIAL IMPRESSION / ASSESSMENT AND PLAN / ED COURSE  Pertinent labs & imaging results that were available during my care of the patient were reviewed by me and considered in my medical decision making (see chart for details).  Patient well appearing no acute distress, presents with dizziness, likely vertigo. Since these are new symptoms a CT head was performed which is unremarkable. Labs are unremarkable. Clinical presentation is consistent with dehydration related to vomiting, so is given IV fluids and antiemetics. On reassessment is feeling much better, able to stand without significant symptoms. Tolerating oral intake. Discharge with meclizine and Zofran, follow up with primary care. I advised him that if symptoms do not resolve in the next 1-2 days he should follow-up with neurology.Considering the patient's symptoms, medical history, and physical examination today, I have low suspicion for ischemic stroke, intracranial hemorrhage, meningitis, encephalitis, carotid or vertebral dissection, venous sinus thrombosis, MS, intracranial hypertension, glaucoma, CRAO, CRVO, or temporal arteritis.      Clinical Course    ____________________________________________   FINAL CLINICAL IMPRESSION(S) / ED DIAGNOSES  Final diagnoses:  Dizziness  Dehydration  Vertigo      New Prescriptions   MECLIZINE  (ANTIVERT) 25 MG TABLET    Take 1 tablet (25 mg total) by mouth 3 (three) times daily as needed for dizziness or nausea. May take 2 tablets at a time if 1 tablet is ineffective.   ONDANSETRON (ZOFRAN ODT) 4 MG DISINTEGRATING TABLET    Take 1 tablet (4 mg total) by mouth every 8 (eight) hours as needed for nausea or vomiting.     Portions of this note were generated with dragon dictation software. Dictation errors may occur despite best attempts at proofreading.    Carrie Mew,  MD 03/18/16 [id number]

## 2016-03-18 NOTE — ED Notes (Signed)
Pt given ginger ale.

## 2016-03-18 NOTE — ED Triage Notes (Signed)
Pt states this AM at 0345 when he got up to use the bathroom he became very dizzy to where he almost could not walk, states he began to vomit after, states at around 0100 when he got up to use the bathroom he was okay, states dizziness at present, awake and alert

## 2016-03-24 ENCOUNTER — Other Ambulatory Visit: Payer: Self-pay | Admitting: Internal Medicine

## 2016-03-24 DIAGNOSIS — R270 Ataxia, unspecified: Secondary | ICD-10-CM

## 2016-03-26 ENCOUNTER — Ambulatory Visit
Admission: RE | Admit: 2016-03-26 | Discharge: 2016-03-26 | Disposition: A | Discharge status: Home / Self Care | Payer: 59 | Source: Ambulatory Visit | Attending: Internal Medicine | Admitting: Internal Medicine

## 2016-03-26 DIAGNOSIS — R9082 White matter disease, unspecified: Secondary | ICD-10-CM | POA: Insufficient documentation

## 2016-03-26 DIAGNOSIS — H532 Diplopia: Secondary | ICD-10-CM | POA: Diagnosis present

## 2016-03-26 DIAGNOSIS — R270 Ataxia, unspecified: Secondary | ICD-10-CM

## 2016-03-26 DIAGNOSIS — G319 Degenerative disease of nervous system, unspecified: Secondary | ICD-10-CM | POA: Diagnosis not present

## 2016-03-26 MED ORDER — GADOBENATE DIMEGLUMINE 529 MG/ML IV SOLN
17.0000 mL | Freq: Once | INTRAVENOUS | Status: AC | PRN
  Administered 2016-03-26: 10 mL via INTRAVENOUS

## 2016-04-01 ENCOUNTER — Ambulatory Visit: Payer: 59

## 2016-04-30 DIAGNOSIS — H532 Diplopia: Secondary | ICD-10-CM | POA: Insufficient documentation

## 2016-04-30 DIAGNOSIS — R270 Ataxia, unspecified: Secondary | ICD-10-CM | POA: Insufficient documentation

## 2016-06-12 ENCOUNTER — Encounter (INDEPENDENT_AMBULATORY_CARE_PROVIDER_SITE_OTHER): Payer: Self-pay

## 2016-07-03 ENCOUNTER — Encounter (INDEPENDENT_AMBULATORY_CARE_PROVIDER_SITE_OTHER): Payer: Self-pay

## 2016-07-03 ENCOUNTER — Ambulatory Visit (INDEPENDENT_AMBULATORY_CARE_PROVIDER_SITE_OTHER): Payer: Self-pay | Admitting: Vascular Surgery

## 2016-08-18 ENCOUNTER — Other Ambulatory Visit (INDEPENDENT_AMBULATORY_CARE_PROVIDER_SITE_OTHER): Payer: Self-pay | Admitting: Vascular Surgery

## 2016-08-18 DIAGNOSIS — I70219 Atherosclerosis of native arteries of extremities with intermittent claudication, unspecified extremity: Secondary | ICD-10-CM

## 2016-08-19 ENCOUNTER — Ambulatory Visit (INDEPENDENT_AMBULATORY_CARE_PROVIDER_SITE_OTHER): Payer: 59

## 2016-08-19 DIAGNOSIS — I70219 Atherosclerosis of native arteries of extremities with intermittent claudication, unspecified extremity: Secondary | ICD-10-CM

## 2016-08-21 ENCOUNTER — Other Ambulatory Visit (INDEPENDENT_AMBULATORY_CARE_PROVIDER_SITE_OTHER): Payer: Self-pay | Admitting: Vascular Surgery

## 2016-08-21 ENCOUNTER — Ambulatory Visit (INDEPENDENT_AMBULATORY_CARE_PROVIDER_SITE_OTHER): Payer: 59

## 2016-08-21 ENCOUNTER — Encounter (INDEPENDENT_AMBULATORY_CARE_PROVIDER_SITE_OTHER): Payer: Self-pay | Admitting: Vascular Surgery

## 2016-08-21 ENCOUNTER — Ambulatory Visit (INDEPENDENT_AMBULATORY_CARE_PROVIDER_SITE_OTHER): Payer: 59 | Admitting: Vascular Surgery

## 2016-08-21 VITALS — BP 133/79 | HR 77 | Resp 16 | Wt 201.0 lb

## 2016-08-21 DIAGNOSIS — I6523 Occlusion and stenosis of bilateral carotid arteries: Secondary | ICD-10-CM

## 2016-08-21 DIAGNOSIS — I10 Essential (primary) hypertension: Secondary | ICD-10-CM

## 2016-08-21 DIAGNOSIS — I70213 Atherosclerosis of native arteries of extremities with intermittent claudication, bilateral legs: Secondary | ICD-10-CM | POA: Diagnosis not present

## 2016-08-21 DIAGNOSIS — E781 Pure hyperglyceridemia: Secondary | ICD-10-CM

## 2016-08-21 DIAGNOSIS — I6529 Occlusion and stenosis of unspecified carotid artery: Secondary | ICD-10-CM | POA: Insufficient documentation

## 2016-08-21 DIAGNOSIS — I70219 Atherosclerosis of native arteries of extremities with intermittent claudication, unspecified extremity: Secondary | ICD-10-CM | POA: Insufficient documentation

## 2016-08-21 NOTE — Assessment & Plan Note (Signed)
His carotid duplex today reveals stable stenosis in the 1-39% range bilaterally in the carotid arteries. He will continue his aspirin, statin, and Plavix. Recheck in 1 year.

## 2016-08-21 NOTE — Progress Notes (Signed)
MRN : [id number]  [name] [name] is a 56 y.o. (1960/07/17) male who presents with chief complaint of  Chief Complaint  Patient presents with  . Follow-up  .  History of Present Illness: Patient returns today in follow up of PAD and carotid disease. He has noticed some mild increase in the left leg pain and numbness and tingling. He says this is still not currently lifestyle limiting, but it is more noticeable. The right leg is about the same. He denies any focal neurologic symptoms such as arm or leg weakness or numbness, speech or swallowing difficulty, or temporary monocular blindness. His carotid duplex today reveals stable stenosis in the 1-39% range bilaterally in the carotid arteries. His lower extremity studies show stable ABIs of 0.86 on the right and 0.75 on the left. Both dropped a few point since his last visit, but are reasonably stable. Duplex shows what would appear to be at least a moderate stenosis in the right SFA stenosis in the 75-99% range in the left SFA. This is also progressed slightly from his previous study in the left leg.  Current Outpatient Prescriptions  Medication Sig Dispense Refill  . aspirin EC 81 MG tablet Take 81 mg by mouth daily.    . clopidogrel (PLAVIX) 75 MG tablet Take 75 mg by mouth daily.    Marland Kitchen diltiazem (CARDIZEM CD) 120 MG 24 hr capsule Take 120 mg by mouth daily.     . meclizine (ANTIVERT) 25 MG tablet Take 1 tablet (25 mg total) by mouth 3 (three) times daily as needed for dizziness or nausea. May take 2 tablets at a time if 1 tablet is ineffective. 30 tablet 1  . metoprolol (TOPROL-XL) 200 MG 24 hr tablet Take 200 mg by mouth daily.     Marland Kitchen omega-3 acid ethyl esters (LOVAZA) 1 G capsule Take 4 g by mouth daily.     . pantoprazole (PROTONIX) 40 MG tablet Take 40 mg by mouth daily.    . rosuvastatin (CRESTOR) 40 MG tablet Take 40 mg by mouth every evening.    . tamsulosin (FLOMAX) 0.4 MG CAPS capsule Take 1 capsule (0.4 mg total) by mouth daily.  10 capsule 0  . thiamine 100 MG tablet Take 1 tablet (100 mg total) by mouth daily. 30 tablet 0  . vitamin B-12 (CYANOCOBALAMIN) 1000 MCG tablet Take 2,000 mcg by mouth daily.    Marland Kitchen HYDROcodone-acetaminophen (NORCO/VICODIN) 5-325 MG tablet Take 1 tablet by mouth every 6 (six) hours as needed for moderate pain. (Patient not taking: Reported on 08/21/2016) 12 tablet 0  . ondansetron (ZOFRAN ODT) 4 MG disintegrating tablet Take 1 tablet (4 mg total) by mouth every 8 (eight) hours as needed for nausea or vomiting. (Patient not taking: Reported on 08/21/2016) 20 tablet 0  . ondansetron (ZOFRAN) 4 MG tablet Take 1 tablet (4 mg total) by mouth daily as needed for nausea or vomiting. (Patient not taking: Reported on 08/21/2016) 10 tablet 0  . oxyCODONE-acetaminophen (ROXICET) 5-325 MG tablet Take 1 tablet by mouth every 6 (six) hours as needed. (Patient not taking: Reported on 08/21/2016) 15 tablet 0   No current facility-administered medications for this visit.     Past Medical History:  Diagnosis Date  . Chest pain    Unspecified  . Chronic kidney disease   . Hypertension   . Hypertriglyceridemia   . Kidney stone on left side   . Palpitations   . Peripheral vascular disease (HCC)     Past  Surgical History:  Procedure Laterality Date  . ESOPHAGOGASTRODUODENOSCOPY (EGD) WITH PROPOFOL N/A 10/22/2015   Procedure: ESOPHAGOGASTRODUODENOSCOPY (EGD) WITH PROPOFOL;  Surgeon: Lucilla Lame, MD;  Location: [facility];  Service: Endoscopy;  Laterality: N/A;  . EXTRACORPOREAL SHOCK WAVE LITHOTRIPSY    . EXTRACORPOREAL SHOCK WAVE LITHOTRIPSY Left 09/13/2014   Procedure: EXTRACORPOREAL SHOCK WAVE LITHOTRIPSY (ESWL);  Surgeon: Hollice Espy, MD;  Location: [facility];  Service: Urology;  Laterality: Left;  . lower extremity interventions x2 right leg Right   . PERIPHERAL VASCULAR CATHETERIZATION Right 08/20/2014   Procedure: Lower Extremity Angiography;  Surgeon: Algernon Huxley, MD;  Location: [place] [facility];   Service: Cardiovascular;  Laterality: Right;  . PERIPHERAL VASCULAR CATHETERIZATION Right 08/20/2014   Procedure: Lower Extremity Intervention;  Surgeon: Algernon Huxley, MD;  Location: [place] [facility];  Service: Cardiovascular;  Laterality: Right;    Social History Social History  Substance Use Topics  . Smoking status: Former Smoker    Packs/day: 1.00    Years: 37.00    Quit date: 04/21/2013  . Smokeless tobacco: Never Used  . Alcohol use 8.4 oz/week    7 Glasses of wine, 7 Cans of beer per week  No IVDU  Family History Family History  Problem Relation Age of Onset  . Heart failure Unknown   No bleeding or clotting disorders, no aneurysms  Allergies  Allergen Reactions  . Fenofibrate Other (See Comments)    Reaction: causes abdominal pain     REVIEW OF SYSTEMS (Negative unless checked)  Constitutional: [] Weight loss  [] Fever  [] Chills Cardiac: [] Chest pain   [] Chest pressure   [] Palpitations   [] Shortness of breath when laying flat   [] Shortness of breath at rest   [] Shortness of breath with exertion. Vascular:  [x] Pain in legs with walking   [] Pain in legs at rest   [] Pain in legs when laying flat   [x] Claudication   [] Pain in feet when walking  [] Pain in feet at rest  [] Pain in feet when laying flat   [] History of DVT   [] Phlebitis   [] Swelling in legs   [] Varicose veins   [] Non-healing ulcers Pulmonary:   [] Uses home oxygen   [] Productive cough   [] Hemoptysis   [] Wheeze  [] COPD   [] Asthma Neurologic:  [] Dizziness  [] Blackouts   [] Seizures   [] History of stroke   [] History of TIA  [] Aphasia   [] Temporary blindness   [] Dysphagia   [] Weakness or numbness in arms   [] Weakness or numbness in legs Musculoskeletal:  [] Arthritis   [] Joint swelling   [] Joint pain   [] Low back pain Hematologic:  [] Easy bruising  [] Easy bleeding   [] Hypercoagulable state   [] Anemic   Gastrointestinal:  [] Blood in stool   [] Vomiting blood  [] Gastroesophageal reflux/heartburn   [] Abdominal  pain Genitourinary:  [] Chronic kidney disease   [] Difficult urination  [] Frequent urination  [] Burning with urination   [] Hematuria Skin:  [] Rashes   [] Ulcers   [] Wounds Psychological:  [] History of anxiety   []  History of major depression.  Physical Examination  BP 133/79   Pulse 77   Resp 16   Wt 201 lb (91.2 kg)   BMI 27.26 kg/m  Gen:  WD/WN, NAD Head: [place]/AT, No temporalis wasting. Ear/Nose/Throat: Hearing grossly intact, nares w/o erythema or drainage, trachea midline Eyes: Conjunctiva clear. Sclera non-icteric Neck: Supple.  No JVD.  Pulmonary:  Good air movement, no use of accessory muscles.  Cardiac: RRR, normal S1, S2 Vascular:  Vessel Right Left  Radial  Palpable Palpable  Ulnar Palpable Palpable  Brachial Palpable Palpable  Carotid Palpable, without bruit Palpable, without bruit  Aorta Not palpable N/A  Femoral Palpable Palpable  Popliteal 1+ Palpable Not Palpable  PT Palpable 1+ Palpable  DP 1+ Palpable 1+ Palpable    Musculoskeletal: M/S 5/5 throughout.  No deformity or atrophy. No edema. Neurologic: Sensation grossly intact in extremities.  Symmetrical.  Speech is fluent.  Psychiatric: Judgment intact, Mood & affect appropriate for pt's clinical situation. Dermatologic: No rashes or ulcers noted.  No cellulitis or open wounds.       Labs Recent Results (from the past 2160 hour(s))  VAS [place] LOWER EXTREMITY ARTERIAL DUPLEX     Status: None (In process)   Collection Time: 08/19/16  1:59 PM  Result Value Ref Range   Left super femoral prox sys PSV -43 cm/s   Left super femoral mid sys PSV -424 cm/s   Left super femoral dist sys PSV -76 cm/s   Left popliteal prox sys PSV 59 cm/s   Left popliteal dist sys PSV -55 cm/s   Right super femoral prox sys PSV -91 cm/s   Right super femoral mid sys PSV -64 cm/s   Right super femoral dist sys PSV -166 cm/s   Right popliteal prox sys PSV -128 cm/s   Right popliteal dist sys PSV -64 cm/s   Right peroneal sys PSV 19  cm/s   Left ant tibial distal sys 20 cm/s   left post tibial dist sys 51 cm/s   RIGHT ANT DIST TIBAL SYS PSV 28 cm/s   RIGHT POST TIB DIST SYS 69 cm/s  VAS US CAROTID     Status: None (In process)   Collection Time: 08/21/16  1:45 PM  Result Value Ref Range   Right CCA prox sys 130 cm/s   Right CCA prox dias 17 cm/s   Right cca dist sys -97 cm/s   Left CCA prox sys 122 cm/s   Left CCA prox dias 28 cm/s   Left CCA dist sys -71 cm/s   Left CCA dist dias -20 cm/s   Left ICA prox sys -90 cm/s   Left ICA prox dias -23 cm/s   Left ICA dist sys -106 cm/s   Left ICA dist dias -39 cm/s   RIGHT CCA MID DIAS 14.00 cm/s   RIGHT ECA DIAS -17.00 cm/s   RIGHT VERTEBRAL DIAS 16.00 cm/s   LEFT ECA DIAS -23.00 cm/s   LEFT VERTEBRAL DIAS 17.00 cm/s    Radiology No results found.    Assessment/Plan  Benign essential HTN blood pressure control important in reducing the progression of atherosclerotic disease. On appropriate oral medications.   HYPERTRIGLYCERIDEMIA lipid control important in reducing the progression of atherosclerotic disease. Continue statin therapy   Carotid stenosis His carotid duplex today reveals stable stenosis in the 1-39% range bilaterally in the carotid arteries. He will continue his aspirin, statin, and Plavix. Recheck in 1 year.  Atherosclerosis of native arteries of extremity with intermittent claudication (HCC) His lower extremity studies show stable ABIs of 0.86 on the right and 0.75 on the left. Both dropped a few point since his last visit, but are reasonably stable. Duplex shows what would appear to be at least a moderate stenosis in the right SFA stenosis in the 75-99% range in the left SFA. This is also progressed slightly from his previous study in the left leg. We discussed today whether or not intervention would be reasonable. His symptoms have progressed but he  does not think they're lifestyle limiting. In this instance, I think we should continue  medical therapy with aspirin, Plavix, and Crestor. He should walk and exercise as much as possible to help develop collateral blood flow. Plan to continue to follow this closely and see him back in 6 months.     Leotis Pain, MD  08/21/2016 2:25 PM    This note was created with Dragon medical transcription system.  Any errors from dictation are purely unintentional

## 2016-08-21 NOTE — Assessment & Plan Note (Signed)
lipid control important in reducing the progression of atherosclerotic disease. Continue statin therapy  

## 2016-08-21 NOTE — Patient Instructions (Signed)

## 2016-08-21 NOTE — Assessment & Plan Note (Signed)
blood pressure control important in reducing the progression of atherosclerotic disease. On appropriate oral medications.  

## 2016-08-21 NOTE — Assessment & Plan Note (Signed)
His lower extremity studies show stable ABIs of 0.86 on the right and 0.75 on the left. Both dropped a few point since his last visit, but are reasonably stable. Duplex shows what would appear to be at least a moderate stenosis in the right SFA stenosis in the 75-99% range in the left SFA. This is also progressed slightly from his previous study in the left leg. We discussed today whether or not intervention would be reasonable. His symptoms have progressed but he does not think they're lifestyle limiting. In this instance, I think we should continue medical therapy with aspirin, Plavix, and Crestor. He should walk and exercise as much as possible to help develop collateral blood flow. Plan to continue to follow this closely and see him back in 6 months.

## 2016-08-28 ENCOUNTER — Telehealth (INDEPENDENT_AMBULATORY_CARE_PROVIDER_SITE_OTHER): Payer: Self-pay

## 2016-08-28 NOTE — Telephone Encounter (Signed)
Patient called stating that his toe seems a little black  He doesn't have any pain and wants to be seen. He has an u/s appt scheduled for 03/02/17 @ 1:00 pm.

## 2016-08-28 NOTE — Telephone Encounter (Signed)
Let the patient know what Dr. Lucky Cowboy recommended regarding his toe , we will bring him in next week to be seen and if it gets worse over the weekend he was advised to go the ED or Urgent care to be evaluated.

## 2016-09-01 ENCOUNTER — Encounter (INDEPENDENT_AMBULATORY_CARE_PROVIDER_SITE_OTHER): Payer: Self-pay | Admitting: Vascular Surgery

## 2016-09-01 ENCOUNTER — Ambulatory Visit (INDEPENDENT_AMBULATORY_CARE_PROVIDER_SITE_OTHER): Payer: 59 | Admitting: Vascular Surgery

## 2016-09-01 VITALS — BP 148/93 | HR 82 | Resp 16 | Wt 201.0 lb

## 2016-09-01 DIAGNOSIS — I70213 Atherosclerosis of native arteries of extremities with intermittent claudication, bilateral legs: Secondary | ICD-10-CM

## 2016-09-01 DIAGNOSIS — I10 Essential (primary) hypertension: Secondary | ICD-10-CM | POA: Diagnosis not present

## 2016-09-01 DIAGNOSIS — I6523 Occlusion and stenosis of bilateral carotid arteries: Secondary | ICD-10-CM

## 2016-09-01 NOTE — Progress Notes (Signed)
Subjective:    Patient ID: [name] [name], male    DOB: December 14, 1960, 56 y.o.   MRN: [id number] Chief Complaint  Patient presents with  . Follow-up   Patient last seen on 08/21/16. Called soon after states the toes on his right foot were "black". Presents today for evaluation. The patient states the color of his toes has gotten better over the weekend. He denies any pain. He denies any drainage from foot. He denies any fever, nausea or vomiting.    Review of Systems  Constitutional: Negative.   HENT: Negative.   Eyes: Negative.   Respiratory: Negative.   Cardiovascular: Negative.   Gastrointestinal: Negative.   Endocrine: Negative.   Genitourinary: Negative.   Musculoskeletal: Negative.   Skin: Negative.   Allergic/Immunologic: Negative.   Neurological: Negative.   Hematological: Negative.   Psychiatric/Behavioral: Negative.       Objective:   Physical Exam  Constitutional: He is oriented to person, place, and time. He appears well-developed and well-nourished. No distress.  HENT:  Head: Normocephalic and atraumatic.  Eyes: Conjunctivae are normal. Pupils are equal, round, and reactive to light.  Neck: Normal range of motion.  Cardiovascular: Normal rate, regular rhythm, normal heart sounds and intact distal pulses.   Pulses:      Radial pulses are 2+ on the right side, and 2+ on the left side.       Dorsalis pedis pulses are 1+ on the right side, and 1+ on the left side.       Posterior tibial pulses are 1+ on the right side, and 1+ on the left side.  Right foot warm. No discoloration noted.   Pulmonary/Chest: Effort normal.  Musculoskeletal: Normal range of motion. He exhibits no edema.  Neurological: He is alert and oriented to person, place, and time.  Skin: Skin is warm and dry. He is not diaphoretic.  Psychiatric: He has a normal mood and affect. His behavior is normal. Judgment and thought content normal.  Vitals reviewed.  BP (!) 148/93   Pulse 82   Resp  16   Wt 201 lb (91.2 kg)   BMI 27.26 kg/m   Past Medical History:  Diagnosis Date  . Chest pain    Unspecified  . Chronic kidney disease   . Hypertension   . Hypertriglyceridemia   . Kidney stone on left side   . Palpitations   . Peripheral vascular disease [facility])    Social History   Social History  . Marital status: Married    Spouse name: N/A  . Number of children: N/A  . Years of education: N/A   Occupational History  . Full Time    Social History Main Topics  . Smoking status: Former Smoker    Packs/day: 1.00    Years: 37.00    Quit date: 04/21/2013  . Smokeless tobacco: Never Used  . Alcohol use 8.4 oz/week    7 Glasses of wine, 7 Cans of beer per week  . Drug use: No  . Sexual activity: Not on file   Other Topics Concern  . Not on file   Social History Narrative   Regular exercise: No   Past Surgical History:  Procedure Laterality Date  . ESOPHAGOGASTRODUODENOSCOPY (EGD) WITH PROPOFOL N/A 10/22/2015   Procedure: ESOPHAGOGASTRODUODENOSCOPY (EGD) WITH PROPOFOL;  Surgeon: Lucilla Lame, MD;  Location: [facility];  Service: Endoscopy;  Laterality: N/A;  . EXTRACORPOREAL SHOCK WAVE LITHOTRIPSY    . EXTRACORPOREAL SHOCK WAVE LITHOTRIPSY Left 09/13/2014   Procedure:  EXTRACORPOREAL SHOCK WAVE LITHOTRIPSY (ESWL);  Surgeon: Hollice Espy, MD;  Location: [facility];  Service: Urology;  Laterality: Left;  . lower extremity interventions x2 right leg Right   . PERIPHERAL VASCULAR CATHETERIZATION Right 08/20/2014   Procedure: Lower Extremity Angiography;  Surgeon: Algernon Huxley, MD;  Location: [place] [facility];  Service: Cardiovascular;  Laterality: Right;  . PERIPHERAL VASCULAR CATHETERIZATION Right 08/20/2014   Procedure: Lower Extremity Intervention;  Surgeon: Algernon Huxley, MD;  Location: [place] [facility];  Service: Cardiovascular;  Laterality: Right;   Family History  Problem Relation Age of Onset  . Heart failure Unknown    Allergies  Allergen Reactions  .  Fenofibrate Other (See Comments)    Reaction: causes abdominal pain      Assessment & Plan:  Patient last seen on 08/21/16. Called soon after states the toes on his right foot were "black". Presents today for evaluation. The patient states the color of his toes has gotten better over the weekend. He denies any pain. He denies any drainage from foot. He denies any fever, nausea or vomiting.  1. Atherosclerosis of native artery of both lower extremities with intermittent claudication ([place]) - Stable Patient with improved discoloration Asymptomatic. Patient on anticoagulation. Most likely trauma to foot which caused bruising as the discoloration is improved. Patient to follow up as per his normal follow up.  2. Bilateral carotid artery stenosis - Stable Followed on a regular basis  3. Benign essential HTN - Stable Encouraged good control as its slows the progression of atherosclerotic disease  Current Outpatient Prescriptions on File Prior to Visit  Medication Sig Dispense Refill  . aspirin EC 81 MG tablet Take 81 mg by mouth daily.    . clopidogrel (PLAVIX) 75 MG tablet Take 75 mg by mouth daily.    Marland Kitchen diltiazem (CARDIZEM CD) 120 MG 24 hr capsule Take 120 mg by mouth daily.     Marland Kitchen HYDROcodone-acetaminophen (NORCO/VICODIN) 5-325 MG tablet Take 1 tablet by mouth every 6 (six) hours as needed for moderate pain. 12 tablet 0  . meclizine (ANTIVERT) 25 MG tablet Take 1 tablet (25 mg total) by mouth 3 (three) times daily as needed for dizziness or nausea. May take 2 tablets at a time if 1 tablet is ineffective. 30 tablet 1  . metoprolol (TOPROL-XL) 200 MG 24 hr tablet Take 200 mg by mouth daily.     Marland Kitchen omega-3 acid ethyl esters (LOVAZA) 1 G capsule Take 4 g by mouth daily.     . ondansetron (ZOFRAN ODT) 4 MG disintegrating tablet Take 1 tablet (4 mg total) by mouth every 8 (eight) hours as needed for nausea or vomiting. 20 tablet 0  . ondansetron (ZOFRAN) 4 MG tablet Take 1 tablet (4 mg total) by  mouth daily as needed for nausea or vomiting. 10 tablet 0  . oxyCODONE-acetaminophen (ROXICET) 5-325 MG tablet Take 1 tablet by mouth every 6 (six) hours as needed. 15 tablet 0  . pantoprazole (PROTONIX) 40 MG tablet Take 40 mg by mouth daily.    . rosuvastatin (CRESTOR) 40 MG tablet Take 40 mg by mouth every evening.    . tamsulosin (FLOMAX) 0.4 MG CAPS capsule Take 1 capsule (0.4 mg total) by mouth daily. 10 capsule 0  . thiamine 100 MG tablet Take 1 tablet (100 mg total) by mouth daily. 30 tablet 0  . vitamin B-12 (CYANOCOBALAMIN) 1000 MCG tablet Take 2,000 mcg by mouth daily.     No current facility-administered medications on  file prior to visit.    There are no Patient Instructions on file for this visit. No Follow-up on file.  Chance Karam A Patria Warzecha, PA-C

## 2017-02-06 ENCOUNTER — Encounter: Payer: Self-pay | Admitting: Emergency Medicine

## 2017-02-06 ENCOUNTER — Emergency Department: Payer: 59

## 2017-02-06 ENCOUNTER — Observation Stay
Admission: EM | Admit: 2017-02-06 | Discharge: 2017-02-07 | DRG: 684 | Disposition: A | Discharge status: Home / Self Care | Payer: 59 | Attending: Internal Medicine | Admitting: Internal Medicine

## 2017-02-06 DIAGNOSIS — N179 Acute kidney failure, unspecified: Secondary | ICD-10-CM | POA: Diagnosis not present

## 2017-02-06 DIAGNOSIS — R319 Hematuria, unspecified: Secondary | ICD-10-CM | POA: Diagnosis not present

## 2017-02-06 DIAGNOSIS — N182 Chronic kidney disease, stage 2 (mild): Secondary | ICD-10-CM | POA: Diagnosis not present

## 2017-02-06 DIAGNOSIS — Z79899 Other long term (current) drug therapy: Secondary | ICD-10-CM | POA: Insufficient documentation

## 2017-02-06 DIAGNOSIS — E781 Pure hyperglyceridemia: Secondary | ICD-10-CM | POA: Diagnosis not present

## 2017-02-06 DIAGNOSIS — N132 Hydronephrosis with renal and ureteral calculous obstruction: Secondary | ICD-10-CM | POA: Diagnosis not present

## 2017-02-06 DIAGNOSIS — R109 Unspecified abdominal pain: Secondary | ICD-10-CM | POA: Diagnosis present

## 2017-02-06 DIAGNOSIS — Z7902 Long term (current) use of antithrombotics/antiplatelets: Secondary | ICD-10-CM | POA: Diagnosis not present

## 2017-02-06 DIAGNOSIS — I739 Peripheral vascular disease, unspecified: Secondary | ICD-10-CM | POA: Diagnosis not present

## 2017-02-06 DIAGNOSIS — I129 Hypertensive chronic kidney disease with stage 1 through stage 4 chronic kidney disease, or unspecified chronic kidney disease: Secondary | ICD-10-CM | POA: Diagnosis not present

## 2017-02-06 DIAGNOSIS — K219 Gastro-esophageal reflux disease without esophagitis: Secondary | ICD-10-CM | POA: Insufficient documentation

## 2017-02-06 DIAGNOSIS — Z87442 Personal history of urinary calculi: Secondary | ICD-10-CM | POA: Diagnosis not present

## 2017-02-06 DIAGNOSIS — Z888 Allergy status to other drugs, medicaments and biological substances status: Secondary | ICD-10-CM | POA: Diagnosis not present

## 2017-02-06 DIAGNOSIS — Z87891 Personal history of nicotine dependence: Secondary | ICD-10-CM | POA: Diagnosis not present

## 2017-02-06 DIAGNOSIS — N201 Calculus of ureter: Secondary | ICD-10-CM

## 2017-02-06 DIAGNOSIS — Z7982 Long term (current) use of aspirin: Secondary | ICD-10-CM | POA: Insufficient documentation

## 2017-02-06 LAB — URINALYSIS, COMPLETE (UACMP) WITH MICROSCOPIC
Bacteria, UA: NONE SEEN
Bilirubin Urine: NEGATIVE
Glucose, UA: NEGATIVE mg/dL
Ketones, ur: NEGATIVE mg/dL
Leukocytes, UA: NEGATIVE
Nitrite: NEGATIVE
PROTEIN: 30 mg/dL — AB
SQUAMOUS EPITHELIAL / LPF: NONE SEEN
Specific Gravity, Urine: 1.019 (ref 1.005–1.030)
pH: 6 (ref 5.0–8.0)

## 2017-02-06 MED ORDER — ONDANSETRON HCL 4 MG PO TABS: 4.0000 mg | ORAL_TABLET | Freq: Four times a day (QID) | ORAL | Status: DC | PRN

## 2017-02-06 MED ORDER — VITAMIN B-12 1000 MCG PO TABS
2000.0000 ug | ORAL_TABLET | Freq: Every day | ORAL | Status: DC
  Administered 2017-02-07: 2000 ug via ORAL
  Filled 2017-02-06: qty 2

## 2017-02-06 MED ORDER — ACETAMINOPHEN 325 MG PO TABS: 650.0000 mg | ORAL_TABLET | Freq: Four times a day (QID) | ORAL | Status: DC | PRN

## 2017-02-06 MED ORDER — ONDANSETRON HCL 4 MG/2ML IJ SOLN: 4.0000 mg | Freq: Four times a day (QID) | INTRAMUSCULAR | Status: DC | PRN

## 2017-02-06 MED ORDER — SENNOSIDES-DOCUSATE SODIUM 8.6-50 MG PO TABS: 1.0000 | ORAL_TABLET | Freq: Every evening | ORAL | Status: DC | PRN

## 2017-02-06 MED ORDER — HYDROCODONE-ACETAMINOPHEN 5-325 MG PO TABS
1.0000 | ORAL_TABLET | ORAL | Status: DC | PRN
  Administered 2017-02-06 – 2017-02-07 (×2): 2 via ORAL
  Administered 2017-02-07: 1 via ORAL
  Filled 2017-02-06 (×3): qty 2

## 2017-02-06 MED ORDER — HYDRALAZINE HCL 20 MG/ML IJ SOLN: 10.0000 mg | Freq: Four times a day (QID) | INTRAMUSCULAR | Status: DC | PRN

## 2017-02-06 MED ORDER — SODIUM CHLORIDE 0.9 % IV BOLUS (SEPSIS)
1000.0000 mL | Freq: Once | INTRAVENOUS | Status: AC
  Administered 2017-02-06: 1000 mL via INTRAVENOUS

## 2017-02-06 MED ORDER — PANTOPRAZOLE SODIUM 40 MG PO TBEC
40.0000 mg | DELAYED_RELEASE_TABLET | Freq: Every day | ORAL | Status: DC
  Administered 2017-02-07: 40 mg via ORAL
  Filled 2017-02-06: qty 1

## 2017-02-06 MED ORDER — ROSUVASTATIN CALCIUM 10 MG PO TABS
40.0000 mg | ORAL_TABLET | Freq: Every evening | ORAL | Status: DC
  Filled 2017-02-06: qty 4

## 2017-02-06 MED ORDER — ALBUTEROL SULFATE (2.5 MG/3ML) 0.083% IN NEBU: 2.5000 mg | INHALATION_SOLUTION | RESPIRATORY_TRACT | Status: DC | PRN

## 2017-02-06 MED ORDER — CEFTRIAXONE SODIUM IN DEXTROSE 20 MG/ML IV SOLN
1.0000 g | Freq: Once | INTRAVENOUS | Status: AC
  Administered 2017-02-06: 1 g via INTRAVENOUS
  Filled 2017-02-06: qty 50

## 2017-02-06 MED ORDER — MECLIZINE HCL 25 MG PO TABS
25.0000 mg | ORAL_TABLET | Freq: Three times a day (TID) | ORAL | Status: DC | PRN
  Filled 2017-02-06: qty 1

## 2017-02-06 MED ORDER — DEXTROSE 5 % IV SOLN
1.0000 g | INTRAVENOUS | Status: DC
  Filled 2017-02-06: qty 10

## 2017-02-06 MED ORDER — SODIUM CHLORIDE 0.9 % IV SOLN
INTRAVENOUS | Status: DC
  Administered 2017-02-06 – 2017-02-07 (×3): via INTRAVENOUS

## 2017-02-06 MED ORDER — FENTANYL CITRATE (PF) 100 MCG/2ML IJ SOLN
100.0000 ug | INTRAMUSCULAR | Status: DC | PRN
  Administered 2017-02-06: 100 ug via INTRAVENOUS
  Filled 2017-02-06: qty 2

## 2017-02-06 MED ORDER — TAMSULOSIN HCL 0.4 MG PO CAPS
0.4000 mg | ORAL_CAPSULE | Freq: Every day | ORAL | Status: DC
  Administered 2017-02-06 – 2017-02-07 (×2): 0.4 mg via ORAL
  Filled 2017-02-06 (×2): qty 1

## 2017-02-06 MED ORDER — BISACODYL 5 MG PO TBEC: 5.0000 mg | DELAYED_RELEASE_TABLET | Freq: Every day | ORAL | Status: DC | PRN

## 2017-02-06 MED ORDER — VITAMIN B-1 100 MG PO TABS
100.0000 mg | ORAL_TABLET | Freq: Every day | ORAL | Status: DC
  Administered 2017-02-07: 100 mg via ORAL
  Filled 2017-02-06: qty 1
  Filled 2017-02-06 (×2): qty 2

## 2017-02-06 MED ORDER — TAMSULOSIN HCL 0.4 MG PO CAPS: 0.4000 mg | ORAL_CAPSULE | Freq: Every day | ORAL | Status: DC

## 2017-02-06 MED ORDER — DILTIAZEM HCL ER COATED BEADS 120 MG PO CP24: 120.0000 mg | ORAL_CAPSULE | Freq: Every day | ORAL | Status: DC

## 2017-02-06 MED ORDER — DILTIAZEM HCL ER COATED BEADS 120 MG PO CP24
120.0000 mg | ORAL_CAPSULE | Freq: Every day | ORAL | Status: DC
  Administered 2017-02-07: 120 mg via ORAL
  Filled 2017-02-06: qty 1

## 2017-02-06 MED ORDER — METOPROLOL SUCCINATE ER 100 MG PO TB24
200.0000 mg | ORAL_TABLET | Freq: Every day | ORAL | Status: DC
  Administered 2017-02-07: 200 mg via ORAL
  Filled 2017-02-06: qty 2

## 2017-02-06 MED ORDER — ACETAMINOPHEN 650 MG RE SUPP: 650.0000 mg | Freq: Four times a day (QID) | RECTAL | Status: DC | PRN

## 2017-02-06 NOTE — ED Notes (Signed)
Admitting Provider at bedside. 

## 2017-02-06 NOTE — Progress Notes (Signed)
Pharmacy Antibiotic Note  [name] [name] is a 56 y.o. male admitted on 02/06/2017 with UTI.  Pharmacy has been consulted for ceftriaxone dosing.  Plan: ceftriaxone 1gm iv q24h   Height: 6' (182.9 cm) Weight: 204 lb (92.5 kg) IBW/kg (Calculated) : 77.6  Temp (24hrs), Avg:98.5 F (36.9 C), Min:98.5 F (36.9 C), Max:98.5 F (36.9 C)  No results for input(s): WBC, CREATININE, LATICACIDVEN, VANCOTROUGH, VANCOPEAK, VANCORANDOM, GENTTROUGH, GENTPEAK, GENTRANDOM, TOBRATROUGH, TOBRAPEAK, TOBRARND, AMIKACINPEAK, AMIKACINTROU, AMIKACIN in the last 168 hours.  CrCl cannot be calculated (Patient's most recent lab result is older than the maximum 21 days allowed.).    Allergies  Allergen Reactions  . Fenofibrate Other (See Comments)    Reaction: causes abdominal pain    Antimicrobials this admission: Anti-infectives    Start     Dose/Rate Route Frequency Ordered Stop   02/07/17 1600  cefTRIAXone (ROCEPHIN) 1 g in dextrose 5 % 50 mL IVPB     1 g 100 mL/hr over 30 Minutes Intravenous Every 24 hours 02/06/17 1750     02/06/17 1530  cefTRIAXone (ROCEPHIN) 1 g in dextrose 5 % 50 mL IVPB - Premix     1 g 100 mL/hr over 30 Minutes Intravenous  Once 02/06/17 1529 02/06/17 1618       Microbiology results: No results found for this or any previous visit (from the past 240 hour(s)).   Thank you for allowing pharmacy to be a part of this patient's care.  Donna Christen Nayab Aten 02/06/2017 5:50 PM

## 2017-02-06 NOTE — Consult Note (Addendum)
Urology Consult  Consulting MD: Merlyn Lot, MD  CC: Right-sided kidney stone 79  HPI: This is a 56year old male with multiple medical issues including gout, peripheral vascular disease, prior history of acute renal insufficiency (creatinine up to approximately 18) within the past year, presenting with a right sided distal ureteral stone.  This became symptomatic a couple of days ago.  He has had intermittent flank pain, nausea, chills.  No discrete fever.  He has had gross hematuria.  There is no left-sided pain.  CT stone protocol today revealed a 4 mm right ureterovesical junction stone with moderate right hydroureteronephrosis.  The left lower pole calculi.  The patient has seen Dr. Hollice Espy in our [place] office in the past for urolithiasis.  The patient is admitted at this time for hydration and pain management.  Baseline creatinine is 1, it was recently 2.0.  PMH: Past Medical History:  Diagnosis Date  . Chest pain    Unspecified  . Chronic kidney disease   . Hypertension   . Hypertriglyceridemia   . Kidney stone on left side   . Palpitations   . Peripheral vascular disease (HCC)     PSH: Past Surgical History:  Procedure Laterality Date  . ESOPHAGOGASTRODUODENOSCOPY (EGD) WITH PROPOFOL N/A 10/22/2015   Procedure: ESOPHAGOGASTRODUODENOSCOPY (EGD) WITH PROPOFOL;  Surgeon: Lucilla Lame, MD;  Location: [facility];  Service: Endoscopy;  Laterality: N/A;  . EXTRACORPOREAL SHOCK WAVE LITHOTRIPSY    . EXTRACORPOREAL SHOCK WAVE LITHOTRIPSY Left 09/13/2014   Procedure: EXTRACORPOREAL SHOCK WAVE LITHOTRIPSY (ESWL);  Surgeon: Hollice Espy, MD;  Location: [facility];  Service: Urology;  Laterality: Left;  . lower extremity interventions x2 right leg Right   . PERIPHERAL VASCULAR CATHETERIZATION Right 08/20/2014   Procedure: Lower Extremity Angiography;  Surgeon: Algernon Huxley, MD;  Location: [place] [facility];  Service: Cardiovascular;  Laterality: Right;  . PERIPHERAL  VASCULAR CATHETERIZATION Right 08/20/2014   Procedure: Lower Extremity Intervention;  Surgeon: Algernon Huxley, MD;  Location: [place] [facility];  Service: Cardiovascular;  Laterality: Right;    Allergies: Allergies  Allergen Reactions  . Fenofibrate Other (See Comments)    Reaction: causes abdominal pain    Medications:  (Not in a hospital admission)   Social History: Social History   Social History  . Marital status: Married    Spouse name: N/A  . Number of children: N/A  . Years of education: N/A   Occupational History  . Full Time    Social History Main Topics  . Smoking status: Former Smoker    Packs/day: 1.00    Years: 37.00    Quit date: 04/21/2013  . Smokeless tobacco: Never Used  . Alcohol use 8.4 oz/week    7 Glasses of wine, 7 Cans of beer per week  . Drug use: No  . Sexual activity: Not on file   Other Topics Concern  . Not on file   Social History Narrative   Regular exercise: No    Family History: Family History  Problem Relation Age of Onset  . Heart failure Unknown     Review of Systems: Positive: Right flank pain, gross hematuria. Negative: No fevers, no emesis.  A further 10 point review of systems was negative except what is listed in the HPI.  Physical Exam: @VITALS2 @ General: No acute distress.  Awake. Head:  Normocephalic.  Atraumatic. ENT:  EOMI.  Mucous membranes moist Neck:  Supple.  No lymphadenopathy. CV:  S1 present. S2 present. Regular rate. Pulmonary:  Equal effort bilaterally.  Clear to auscultation bilaterally. Abdomen: Soft.  Right CVA and lower quadrant tenderness.  Obese. Skin:  Normal turgor.  No visible rash. Extremity: No gross deformity of bilateral upper extremities.  No gross deformity of    bilateral lower extremities. Neurologic: Alert. Appropriate mood.    Studies:  No results for input(s): HGB, WBC, PLT in the last 72 hours.  No results for input(s): NA, K, CL, CO2, BUN, CREATININE, CALCIUM, GFRNONAA,  GFRAA in the last 72 hours.  Invalid input(s): MAGNESIUM   No results for input(s): INR, APTT in the last 72 hours.  Invalid input(s): PT   Invalid input(s): ABG    Assessment: Right distal ureteral stone.  This looks like it may well have passed into the bladder by now, at the very least right at the ureteral orifice.  He is fairly comfortable.  He has had a bump in his creatinine, probably secondary to underlying chronic kidney disease.  Plan: As he has no complicating factors other than slight increased creatinine, and he is comfortable, I agree at this point with admission for hydration and pain management.  I will make sure he is on Flomax to assist with medical expulsive therapy.  We will follow him with you.    Pager:[phone]

## 2017-02-06 NOTE — ED Provider Notes (Signed)
[facility] Emergency Department Provider Note    First MD Initiated Contact with Patient 02/06/17 1407     (approximate)  I have reviewed the triage vital signs and the nursing notes.   HISTORY  Chief Complaint Abnormal Labs    HPI [name] [name] is a 56 y.o. male presents to the ER with chief complaint of to 3 days of right flank pain hematuria and elevated blood pressure.  Patient has had history of kidney stones as well as history of significant renal failure requiring dialysis but no longer on dialysis.  Denies any fevers but may have had some chills throughout the day.  Blood work done at her nodal clinic showed evidence of AK I and had hematuria with proteinuria.  States his pain is mild to moderate in severity.  Constant in nature.  Past Medical History:  Diagnosis Date  . Chest pain    Unspecified  . Chronic kidney disease   . Hypertension   . Hypertriglyceridemia   . Kidney stone on left side   . Palpitations   . Peripheral vascular disease ([place])    Family History  Problem Relation Age of Onset  . Heart failure Unknown    Past Surgical History:  Procedure Laterality Date  . ESOPHAGOGASTRODUODENOSCOPY (EGD) WITH PROPOFOL N/A 10/22/2015   Procedure: ESOPHAGOGASTRODUODENOSCOPY (EGD) WITH PROPOFOL;  Surgeon: Lucilla Lame, MD;  Location: [facility];  Service: Endoscopy;  Laterality: N/A;  . EXTRACORPOREAL SHOCK WAVE LITHOTRIPSY    . EXTRACORPOREAL SHOCK WAVE LITHOTRIPSY Left 09/13/2014   Procedure: EXTRACORPOREAL SHOCK WAVE LITHOTRIPSY (ESWL);  Surgeon: Hollice Espy, MD;  Location: [facility];  Service: Urology;  Laterality: Left;  . lower extremity interventions x2 right leg Right   . PERIPHERAL VASCULAR CATHETERIZATION Right 08/20/2014   Procedure: Lower Extremity Angiography;  Surgeon: Algernon Huxley, MD;  Location: [place] [facility];  Service: Cardiovascular;  Laterality: Right;  . PERIPHERAL VASCULAR CATHETERIZATION Right 08/20/2014     Procedure: Lower Extremity Intervention;  Surgeon: Algernon Huxley, MD;  Location: [place] [facility];  Service: Cardiovascular;  Laterality: Right;   Patient Active Problem List   Diagnosis Date Noted  . Carotid stenosis 08/21/2016  . Atherosclerosis of native arteries of extremity with intermittent claudication ([place]) 08/21/2016  . Esophagitis, unspecified   . Portal hypertension ([place])   . Absolute anemia   . Other acute pancreatitis   . Acute blood loss anemia   . AKI (acute kidney injury) ([place]) 10/19/2015  . Hepatitis 08/29/2014  . Elevated PSA 08/29/2014  . GERD (gastroesophageal reflux disease) 08/29/2014  . Gross hematuria 08/29/2014  . History of tobacco use 01/13/2014  . Benign essential HTN 09/03/2013  . Peripheral blood vessel disorder ([place]) 09/03/2013  . Palpitations 07/14/2011  . HYPERTRIGLYCERIDEMIA 09/13/2008  . CHEST PAIN-UNSPECIFIED 09/13/2008      Prior to Admission medications   Medication Sig Start Date End Date Taking? Authorizing Provider  aspirin EC 81 MG tablet Take 81 mg by mouth daily.    [provider]  clopidogrel (PLAVIX) 75 MG tablet Take 75 mg by mouth daily.    [provider]  diltiazem (CARDIZEM CD) 120 MG 24 hr capsule Take 120 mg by mouth daily.     [provider]  HYDROcodone-acetaminophen (NORCO/VICODIN) 5-325 MG tablet Take 1 tablet by mouth every 6 (six) hours as needed for moderate pain. 10/30/15   Loletha Grayer, MD  meclizine (ANTIVERT) 25 MG tablet Take 1 tablet (25 mg total) by mouth  3 (three) times daily as needed for dizziness or nausea. May take 2 tablets at a time if 1 tablet is ineffective. 03/18/16   Carrie Mew, MD  metoprolol (TOPROL-XL) 200 MG 24 hr tablet Take 200 mg by mouth daily.     [provider]  omega-3 acid ethyl esters (LOVAZA) 1 G capsule Take 4 g by mouth daily.     [provider]  ondansetron (ZOFRAN ODT) 4 MG disintegrating tablet Take 1 tablet (4 mg total) by  mouth every 8 (eight) hours as needed for nausea or vomiting. 03/18/16   Carrie Mew, MD  ondansetron (ZOFRAN) 4 MG tablet Take 1 tablet (4 mg total) by mouth daily as needed for nausea or vomiting. 11/20/15   Schuyler Amor, MD  oxyCODONE-acetaminophen (ROXICET) 5-325 MG tablet Take 1 tablet by mouth every 6 (six) hours as needed. 11/20/15   Schuyler Amor, MD  pantoprazole (PROTONIX) 40 MG tablet Take 40 mg by mouth daily.    [provider]  rosuvastatin (CRESTOR) 40 MG tablet Take 40 mg by mouth every evening.    [provider]  tamsulosin (FLOMAX) 0.4 MG CAPS capsule Take 1 capsule (0.4 mg total) by mouth daily. 11/20/15   Schuyler Amor, MD  thiamine 100 MG tablet Take 1 tablet (100 mg total) by mouth daily. 10/30/15   Loletha Grayer, MD  vitamin B-12 (CYANOCOBALAMIN) 1000 MCG tablet Take 2,000 mcg by mouth daily.    [provider]    Allergies Fenofibrate    Social History Social History  Substance Use Topics  . Smoking status: Former Smoker    Packs/day: 1.00    Years: 37.00    Quit date: 04/21/2013  . Smokeless tobacco: Never Used  . Alcohol use 8.4 oz/week    7 Glasses of wine, 7 Cans of beer per week    Review of Systems Patient denies headaches, rhinorrhea, blurry vision, numbness, shortness of breath, chest pain, edema, cough, abdominal pain, nausea, vomiting, diarrhea, dysuria, fevers, rashes or hallucinations unless otherwise stated above in HPI. ____________________________________________   PHYSICAL EXAM:  VITAL SIGNS: Vitals:   02/06/17 1115  BP: (!) 161/100  Pulse: 85  Resp: 18  Temp: 98.5 F (36.9 C)  SpO2: 95%    Constitutional: Alert and oriented. Well appearing and in no acute distress. Eyes: Conjunctivae are normal.  Head: Atraumatic. Nose: No congestion/rhinnorhea. Mouth/Throat: Mucous membranes are moist.   Neck: No stridor. Painless ROM.  Cardiovascular: Normal rate, regular rhythm. Grossly normal  heart sounds.  Good peripheral circulation. Respiratory: Normal respiratory effort.  No retractions. Lungs CTAB. Gastrointestinal: Soft and nontender. No distention. No abdominal bruits. No CVA tenderness. Genitourinary:  Musculoskeletal: No lower extremity tenderness nor edema.  No joint effusions. Neurologic:  Normal speech and language. No gross focal neurologic deficits are appreciated. No facial droop Skin:  Skin is warm, dry and intact. No rash noted. Psychiatric: Mood and affect are normal. Speech and behavior are normal.  ____________________________________________   LABS (all labs ordered are listed, but only abnormal results are displayed)  No results found for this or any previous visit (from the past 24 hour(s)). ____________________________________________  ____________________________________________  [id number]  I personally reviewed all radiographic images ordered to evaluate for the above acute complaints and reviewed radiology reports and findings.  These findings were personally discussed with the patient.  Please see medical record for radiology report.  ____________________________________________   PROCEDURES  Procedure(s) performed:  Procedures    Critical Care performed: no  ____________________________________________   INITIAL IMPRESSION / ASSESSMENT AND PLAN / ED COURSE  Pertinent labs & imaging results that were available during my care of the patient were reviewed by me and considered in my medical decision making (see chart for details).  DDX: stone, pyelo, golmerulonephritis AAA, msk strain  [name] [name] is a 56 y.o. who presents to the ED with right flank pain as described above.  He is afebrile and hemodynamically stable but is hypertensive.  Blood work from clinic does show urinalysis few bacteria, hematuria and protein.  There is evidence of AK I but no acidosis or hyperkalemia.  Renal ultrasound showed no significant hydro-or  abnormality.  Based on his presentation CT imaging ordered to further evaluate for stones shows evidence of obstructing right 4 mm UVJ stone with acute hydronephrosis.  I spoke with Dr. Zollie Scale of nephrology who agrees to consult given his past history of significant AK I and renal disease.  I also spoke with urology on-call, Dr. Dorina Hoyer, who agrees to consult and evaluate patient for need for stent.  Agrees with medical management at this point.  Have discussed with the patient and available family all diagnostics and treatments performed thus far and all questions were answered to the best of my ability. The patient demonstrates understanding and agreement with plan.  Spoke with Dr. Bridgett Larsson of hospitalist group who kindly agrees to admit patient for further eval and management.        ____________________________________________   FINAL CLINICAL IMPRESSION(S) / ED DIAGNOSES  Final diagnoses:  Acute right flank pain  AKI (acute kidney injury) ([place])  Ureterolithiasis  Hydronephrosis with urinary obstruction due to ureteral calculus      NEW MEDICATIONS STARTED DURING THIS VISIT:  New Prescriptions   No medications on file     Note:  This document was prepared using Dragon voice recognition software and may include unintentional dictation errors.    Merlyn Lot, MD 02/06/17 1538

## 2017-02-06 NOTE — ED Triage Notes (Signed)
Patient arrives from [facility] with "Abnormal Labs". Patient's PCP shows concern over CRT 2.0 and would like a possible work-up for glomerulonephritis. Patient states his orig reason for PCP visit was right sided flank pain.

## 2017-02-06 NOTE — ED Notes (Signed)
Patient transported to CT 

## 2017-02-06 NOTE — H&P (Addendum)
[facility] NAME: [name] [name]    MR#:  [id number]  DATE OF BIRTH:  04-01-1961  DATE OF ADMISSION:  02/06/2017  PRIMARY CARE PHYSICIAN: Kirk Ruths, MD   REQUESTING/REFERRING PHYSICIAN: Merlyn Lot, MD  CHIEF COMPLAINT:   Chief Complaint  Patient presents with  . Abnormal Labs   Right-sided flank pain for 3 days. Abnormal lab.  HISTORY OF PRESENT ILLNESS:  [name] [name]  is a 56 y.o. male with a known history of nephrolithiasis, renal failure on hemodialysis and resolved resolved, HTN, HLP and PVD. He is sent from [place] clinic to ED due to abnormal labs today. He is found elevated creatinine at 2.0. He complains of fever, chills and right-sided flank pain for 3 days. He also complains of hematuria but denies any urinary frequency or urgency. Blood work from clinic does show urinalysis few bacteria, hematuria and protein. CT of the abdomen showevidence of obstructing right 4 mm UVJ stone with acute hydronephrosis. On call urologist recommend medical expulsive therapy (flomax), hydration, close followup of renal function.  PAST MEDICAL HISTORY:   Past Medical History:  Diagnosis Date  . Chest pain    Unspecified  . Chronic kidney disease   . Hypertension   . Hypertriglyceridemia   . Kidney stone on left side   . Palpitations   . Peripheral vascular disease ([place])     PAST SURGICAL HISTORY:   Past Surgical History:  Procedure Laterality Date  . ESOPHAGOGASTRODUODENOSCOPY (EGD) WITH PROPOFOL N/A 10/22/2015   Procedure: ESOPHAGOGASTRODUODENOSCOPY (EGD) WITH PROPOFOL;  Surgeon: Lucilla Lame, MD;  Location: [facility] ENDOSCOPY;  Service: Endoscopy;  Laterality: N/A;  . EXTRACORPOREAL SHOCK WAVE LITHOTRIPSY    . EXTRACORPOREAL SHOCK WAVE LITHOTRIPSY Left 09/13/2014   Procedure: EXTRACORPOREAL SHOCK WAVE LITHOTRIPSY (ESWL);  Surgeon: Hollice Espy, MD;  Location: [facility] ORS;  Service: Urology;  Laterality: Left;    . lower extremity interventions x2 right leg Right   . PERIPHERAL VASCULAR CATHETERIZATION Right 08/20/2014   Procedure: Lower Extremity Angiography;  Surgeon: Algernon Huxley, MD;  Location: [facility];  Service: Cardiovascular;  Laterality: Right;  . PERIPHERAL VASCULAR CATHETERIZATION Right 08/20/2014   Procedure: Lower Extremity Intervention;  Surgeon: Algernon Huxley, MD;  Location: [facility];  Service: Cardiovascular;  Laterality: Right;    SOCIAL HISTORY:   Social History  Substance Use Topics  . Smoking status: Former Smoker    Packs/day: 1.00    Years: 37.00    Quit date: 04/21/2013  . Smokeless tobacco: Never Used  . Alcohol use 8.4 oz/week    7 Glasses of wine, 7 Cans of beer per week    FAMILY HISTORY:   Family History  Problem Relation Age of Onset  . Heart failure Unknown     DRUG ALLERGIES:   Allergies  Allergen Reactions  . Fenofibrate Other (See Comments)    Reaction: causes abdominal pain    REVIEW OF SYSTEMS:   Review of Systems  Constitutional: Positive for chills, fever and malaise/fatigue.  HENT: Negative for sore throat.   Eyes: Negative for blurred vision and double vision.  Respiratory: Negative for cough, hemoptysis, shortness of breath, wheezing and stridor.   Cardiovascular: Negative for chest pain, palpitations, orthopnea and leg swelling.  Gastrointestinal: Negative for abdominal pain, blood in stool, diarrhea, melena, nausea and vomiting.  Genitourinary: Positive for hematuria. Negative for dysuria and flank pain.       Right-sided flank pain  Musculoskeletal:  Negative for back pain and joint pain.  Skin: Negative for rash.  Neurological: Negative for dizziness, sensory change, focal weakness, seizures, loss of consciousness, weakness and headaches.  Endo/Heme/Allergies: Negative for polydipsia.  Psychiatric/Behavioral: Negative for depression. The patient is not nervous/anxious.     MEDICATIONS AT HOME:   Prior to  Admission medications   Medication Sig Start Date End Date Taking? Authorizing Provider  aspirin EC 81 MG tablet Take 81 mg by mouth daily.   Yes [provider]  clopidogrel (PLAVIX) 75 MG tablet Take 75 mg by mouth daily.   Yes [provider]  diltiazem (CARDIZEM CD) 120 MG 24 hr capsule Take 120 mg by mouth daily.    Yes [provider]  metoprolol (TOPROL-XL) 200 MG 24 hr tablet Take 200 mg by mouth daily.    Yes [provider]  omega-3 acid ethyl esters (LOVAZA) 1 G capsule Take 4 g by mouth daily.    Yes [provider]  pantoprazole (PROTONIX) 40 MG tablet Take 40 mg by mouth daily.   Yes [provider]  rosuvastatin (CRESTOR) 40 MG tablet Take 40 mg by mouth every evening.   Yes [provider]  HYDROcodone-acetaminophen (NORCO/VICODIN) 5-325 MG tablet Take 1 tablet by mouth every 6 (six) hours as needed for moderate pain. Patient not taking: Reported on 02/06/2017 10/30/15   Loletha Grayer, MD  meclizine (ANTIVERT) 25 MG tablet Take 1 tablet (25 mg total) by mouth 3 (three) times daily as needed for dizziness or nausea. May take 2 tablets at a time if 1 tablet is ineffective. Patient not taking: Reported on 02/06/2017 03/18/16   Carrie Mew, MD  ondansetron (ZOFRAN ODT) 4 MG disintegrating tablet Take 1 tablet (4 mg total) by mouth every 8 (eight) hours as needed for nausea or vomiting. Patient not taking: Reported on 02/06/2017 03/18/16   Carrie Mew, MD  ondansetron (ZOFRAN) 4 MG tablet Take 1 tablet (4 mg total) by mouth daily as needed for nausea or vomiting. Patient not taking: Reported on 02/06/2017 11/20/15   Schuyler Amor, MD  oxyCODONE-acetaminophen (ROXICET) 5-325 MG tablet Take 1 tablet by mouth every 6 (six) hours as needed. 11/20/15   Schuyler Amor, MD  tamsulosin (FLOMAX) 0.4 MG CAPS capsule Take 1 capsule (0.4 mg total) by mouth daily. Patient not taking: Reported on 02/06/2017 11/20/15   Schuyler Amor, MD  thiamine 100 MG tablet Take 1 tablet (100 mg total) by mouth daily. Patient not taking: Reported on 02/06/2017 10/30/15   Loletha Grayer, MD      VITAL SIGNS:  Blood pressure (!) 172/102, pulse 81, temperature 98.5 F (36.9 C), temperature source Oral, resp. rate 18, height 6' (1.829 m), weight 204 lb (92.5 kg), SpO2 98 %.  PHYSICAL EXAMINATION:  Physical Exam  Constitutional: He is oriented to person, place, and time and well-developed, well-nourished, and in no distress.  Morbid obesity  HENT:  Head: Normocephalic.  Mouth/Throat: Oropharynx is clear and moist.  Eyes: Pupils are equal, round, and reactive to light. Conjunctivae and EOM are normal. No scleral icterus.  Neck: Normal range of motion. Neck supple. No JVD present. No tracheal deviation present.  Cardiovascular: Normal rate, regular rhythm and normal heart sounds.  Exam reveals no gallop.   No murmur heard. Pulmonary/Chest: Effort normal and breath sounds normal. No respiratory distress. He has no wheezes. He has no rales.  Abdominal: Soft. Bowel sounds are normal. He exhibits no distension. There is no tenderness. There  is no rebound.  Musculoskeletal: Normal range of motion. He exhibits no edema or tenderness.  Neurological: He is alert and oriented to person, place, and time. No cranial nerve deficit.  Skin: No rash noted. No erythema.  Psychiatric: Affect normal.    LABORATORY PANEL:   CBC No results for input(s): WBC, HGB, HCT, PLT in the last 168 hours. ------------------------------------------------------------------------------------------------------------------  Chemistries  No results for input(s): NA, K, CL, CO2, GLUCOSE, BUN, CREATININE, CALCIUM, MG, AST, ALT, ALKPHOS, BILITOT in the last 168 hours.  Invalid input(s): GFRCGP ------------------------------------------------------------------------------------------------------------------  Cardiac Enzymes No results for input(s):  TROPONINI in the last 168 hours. ------------------------------------------------------------------------------------------------------------------  RADIOLOGY:  US Renal  Result Date: 02/06/2017 CLINICAL DATA:  Flank pain for 3 days. EXAM: RENAL / URINARY TRACT ULTRASOUND COMPLETE COMPARISON:  CT scan November 20, 2015 FINDINGS: Right Kidney: Length: 12.9 cm. There is a 3.2 cm simple cyst in the lower pole. No masses or obstruction identified. Left Kidney: Length: 12.8 cm. Echogenicity within normal limits. No mass or hydronephrosis visualized. Bladder: Poorly distended limiting evaluation.  No abnormalities are noted. IMPRESSION: 1. There is a simple cyst off the lower pole of the right kidney. No other abnormalities identified on this study. Electronically Signed   By: Dorise Bullion III M.D   On: 02/06/2017 12:18   Ct Renal Stone Study  Result Date: 02/06/2017 CLINICAL DATA:  Patient with right-sided flank pain.  Hematuria. EXAM: CT ABDOMEN AND PELVIS WITHOUT CONTRAST TECHNIQUE: Multidetector CT imaging of the abdomen and pelvis was performed following the standard protocol without IV contrast. COMPARISON:  CT abdomen pelvis 11/20/2015. FINDINGS: Lower chest: Normal heart size. Dependent atelectasis within the lower lobes bilaterally. No pleural effusion. Hepatobiliary: The liver is low in attenuation compatible with steatosis. Sludge within the gallbladder lumen. No intrahepatic or extrahepatic biliary ductal dilatation. Pancreas: Unremarkable Spleen: Unremarkable Adrenals/Urinary Tract: Stable appearance of the adrenal glands. Bilateral perinephric fat stranding. Multiple stones demonstrated within the left kidney with a conglomerate of stones measuring up to 1.7 cm within the inferior pole (image 38; series 2). No left-sided hydronephrosis. There is moderate right hydroureteronephrosis to the level of the urinary bladder/distal right ureter were there is an obstructing 4 mm stone at the UVJ (image 69;  series 2). The urinary bladder is relatively decompressed. There is a 3.9 cm exophytic cyst off the interpolar region of the right kidney. Stomach/Bowel: Descending and sigmoid colonic diverticulosis. No CT evidence for acute diverticulitis. Normal appendix. Normal morphology of the stomach. No free intraperitoneal air. Small amount of fluid within the right pericolic gutter and pelvis. Vascular/Lymphatic: Normal caliber abdominal aorta. Peripheral calcified atherosclerotic plaque. No retroperitoneal lymphadenopathy. Reproductive: Prostate unremarkable. Other: Bilateral fat containing inguinal hernias. Musculoskeletal: Lumbar spine degenerative changes. No aggressive or acute appearing osseous lesions. IMPRESSION: 1. There is an obstructing 4 mm stone at the right UVJ resulting in moderate right hydronephrosis. 2. Multiple nonobstructing left-sided renal stones. 3. Hepatic steatosis. Electronically Signed   By: Lovey Newcomer M.D.   On: 02/06/2017 15:01      IMPRESSION AND PLAN:   Acute renal failure, possible due to right-sided hydronephrosis, rule out glomerulonephritis. Continue Rocephin. Start normal saline IV, f/u Dr.Lateef, f/u BMP.    Right-sided hydronephrosis due to nephrolithiasis. Start Flomax.  Follow-up urologist and f/u BMP. Pain control.  HTN.  continue Cardizem and Lopressor, IV hydralazine when necessary. PVD. Hold aspirin and Plavix due to hematuria and the possible procedure tomorrow.   DVT prophylaxis. SCD, hold heparin SQ. Morbid obesity.  All  the records are reviewed and case discussed with ED provider. Management plans discussed with the patient, family and they are in agreement.  CODE STATUS: Full code  TOTAL TIME TAKING CARE OF THIS PATIENT: 55 minutes.    Demetrios Loll M.D on 02/06/2017 at 3:58 PM  Between 7am to 6pm - Pager - [phone]  After 6pm go to [email or web address] - [employer] [facility]  Office  [phone]  CC:  Primary care physician; Kirk Ruths, MD   Note: This dictation was prepared with Dragon dictation along with smaller phrase technology. Any transcriptional errors that result from this process are unin

## 2017-02-06 NOTE — ED Notes (Addendum)
Spoke with Archie Balboa MD Concerning patient's lab values. new orders rcvd.

## 2017-02-07 ENCOUNTER — Other Ambulatory Visit: Payer: Self-pay

## 2017-02-07 LAB — CBC
HCT: 38.8 % — ABNORMAL LOW (ref 40.0–52.0)
Hemoglobin: 13 g/dL (ref 13.0–18.0)
MCH: 32.3 pg (ref 26.0–34.0)
MCHC: 33.4 g/dL (ref 32.0–36.0)
MCV: 96.8 fL (ref 80.0–100.0)
Platelets: 96 10*3/uL — ABNORMAL LOW (ref 150–440)
RBC: 4.01 MIL/uL — ABNORMAL LOW (ref 4.40–5.90)
RDW: 13.9 % (ref 11.5–14.5)
WBC: 7.4 10*3/uL (ref 3.8–10.6)

## 2017-02-07 LAB — BASIC METABOLIC PANEL
Anion gap: 7 (ref 5–15)
BUN: 17 mg/dL (ref 6–20)
CALCIUM: 8 mg/dL — AB (ref 8.9–10.3)
CO2: 26 mmol/L (ref 22–32)
CREATININE: 1.45 mg/dL — AB (ref 0.61–1.24)
Chloride: 107 mmol/L (ref 101–111)
GFR, EST AFRICAN AMERICAN: 60 mL/min — AB (ref >60)
GFR, EST NON AFRICAN AMERICAN: 52 mL/min — AB (ref >60)
GLUCOSE: 126 mg/dL — AB (ref 65–99)
Potassium: 3.7 mmol/L (ref 3.5–5.1)
Sodium: 140 mmol/L (ref 135–145)

## 2017-02-07 MED ORDER — TAMSULOSIN HCL 0.4 MG PO CAPS: 0.4000 mg | ORAL_CAPSULE | Freq: Every day | ORAL | 0 refills | Status: DC

## 2017-02-07 NOTE — Progress Notes (Signed)
[name] [name] to be D/C'd home per MD order.  Discussed prescriptions and follow up appointments with the patient. Prescriptions given to patient, medication list explained in detail. Pt verbalized understanding.  Allergies as of 02/07/2017      Reactions   Fenofibrate Other (See Comments)   Reaction: causes abdominal pain      Medication List    STOP taking these medications   HYDROcodone-acetaminophen 5-325 MG tablet Commonly known as:  NORCO/VICODIN   meclizine 25 MG tablet Commonly known as:  ANTIVERT   ondansetron 4 MG disintegrating tablet Commonly known as:  ZOFRAN ODT   ondansetron 4 MG tablet Commonly known as:  ZOFRAN   oxyCODONE-acetaminophen 5-325 MG tablet Commonly known as:  ROXICET   thiamine 100 MG tablet     TAKE these medications   aspirin EC 81 MG tablet Take 81 mg by mouth daily.   clopidogrel 75 MG tablet Commonly known as:  PLAVIX Take 75 mg by mouth daily.   diltiazem 120 MG 24 hr capsule Commonly known as:  CARDIZEM CD Take 120 mg by mouth daily.   metoprolol 200 MG 24 hr tablet Commonly known as:  TOPROL-XL Take 200 mg by mouth daily.   omega-3 acid ethyl esters 1 g capsule Commonly known as:  LOVAZA Take 4 g by mouth daily.   pantoprazole 40 MG tablet Commonly known as:  PROTONIX Take 40 mg by mouth daily.   rosuvastatin 40 MG tablet Commonly known as:  CRESTOR Take 40 mg by mouth every evening.   tamsulosin 0.4 MG Caps capsule Commonly known as:  FLOMAX Take 1 capsule (0.4 mg total) daily by mouth.       Vitals:   02/06/17 1936 02/07/17 0432  BP: (!) 114/57 136/70  Pulse: 95 95  Resp: 18   Temp: 98.7 F (37.1 C) 98 F (36.7 C)  SpO2: 94% 97%    Skin clean, dry and intact without evidence of skin break down, no evidence of skin tears noted. IV catheter discontinued intact. Site without signs and symptoms of complications. Dressing and pressure applied. Pt denies pain at this time. No complaints noted.  An  After Visit Summary was printed and given to the patient. Patient escorted via [place], and D/C home via private auto.  [name] [name] A

## 2017-02-07 NOTE — Progress Notes (Signed)
Patient ID: [name] [name], male   DOB: [birth year], 56 y.o.   MRN: [id number]    Subjective: [name] [name] is doing well this morning with no further pain.  He passed something this morning but the urine wasn't strained.   His Cr has declined to 1.45.  He has no associated signs or symptoms.    He has a history of uric acid stones and was found to have a left renal stone as well.    ROS:  Review of Systems  Constitutional: Negative for fever.  Gastrointestinal: Negative for nausea.  Genitourinary: Positive for hematuria (The urine was dark when he thinks he passed the stone but it has cleared. ).    Anti-infectives: Anti-infectives (From admission, onward)   Start     Dose/Rate Route Frequency Ordered Stop   02/07/17 1600  cefTRIAXone (ROCEPHIN) 1 g in dextrose 5 % 50 mL IVPB     1 g 100 mL/hr over 30 Minutes Intravenous Every 24 hours 02/06/17 1750     02/06/17 1530  cefTRIAXone (ROCEPHIN) 1 g in dextrose 5 % 50 mL IVPB - Premix     1 g 100 mL/hr over 30 Minutes Intravenous  Once 02/06/17 1529 02/06/17 1618      Current Facility-Administered Medications  Medication Dose Route Frequency Provider Last Rate Last Dose  . 0.9 %  sodium chloride infusion   Intravenous Continuous Demetrios Loll, MD 125 mL/hr at 02/07/17 0813    . acetaminophen (TYLENOL) tablet 650 mg  650 mg Oral Q6H PRN Demetrios Loll, MD       Or  . acetaminophen (TYLENOL) suppository 650 mg  650 mg Rectal Q6H PRN Demetrios Loll, MD      . albuterol (PROVENTIL) (2.5 MG/3ML) 0.083% nebulizer solution 2.5 mg  2.5 mg Nebulization Q2H PRN Demetrios Loll, MD      . bisacodyl (DULCOLAX) EC tablet 5 mg  5 mg Oral Daily PRN Demetrios Loll, MD      . cefTRIAXone (ROCEPHIN) 1 g in dextrose 5 % 50 mL IVPB  1 g Intravenous Q24H Coffee, Donna Christen, RPH      . diltiazem (CARDIZEM CD) 24 hr capsule 120 mg  120 mg Oral Daily Demetrios Loll, MD      . fentaNYL (SUBLIMAZE) injection 100 mcg  100 mcg Intravenous Q1H PRN Merlyn Lot, MD   100 mcg at 02/06/17  1531  . hydrALAZINE (APRESOLINE) injection 10 mg  10 mg Intravenous Q6H PRN Demetrios Loll, MD      . HYDROcodone-acetaminophen (NORCO/VICODIN) 5-325 MG per tablet 1-2 tablet  1-2 tablet Oral Q4H PRN Demetrios Loll, MD   2 tablet at 02/07/17 0813  . meclizine (ANTIVERT) tablet 25 mg  25 mg Oral TID PRN Demetrios Loll, MD      . metoprolol succinate (TOPROL-XL) 24 hr tablet 200 mg  200 mg Oral Daily Demetrios Loll, MD      . ondansetron [facility]) tablet 4 mg  4 mg Oral Q6H PRN Demetrios Loll, MD       Or  . ondansetron [facility]) injection 4 mg  4 mg Intravenous Q6H PRN Demetrios Loll, MD      . pantoprazole (PROTONIX) EC tablet 40 mg  40 mg Oral Daily Demetrios Loll, MD      . rosuvastatin (CRESTOR) tablet 40 mg  40 mg Oral QPM Demetrios Loll, MD      . senna-docusate (Senokot-S) tablet 1 tablet  1 tablet Oral QHS PRN Demetrios Loll, MD      .  tamsulosin (FLOMAX) capsule 0.4 mg  0.4 mg Oral Daily Demetrios Loll, MD   0.4 mg at 02/06/17 1829  . thiamine (VITAMIN B-1) tablet 100 mg  100 mg Oral Daily Demetrios Loll, MD      . vitamin B-12 (CYANOCOBALAMIN) tablet 2,000 mcg  2,000 mcg Oral Daily Demetrios Loll, MD         Objective: Vital signs in last 24 hours: Temp:  [98 F (36.7 C)-98.7 F (37.1 C)] 98 F (36.7 C) (11/04 0432) Pulse Rate:  [78-95] 95 (11/04 0432) Resp:  [16-18] 18 (11/03 1936) BP: (114-172)/(57-102) 136/70 (11/04 0432) SpO2:  [92 %-98 %] 97 % (11/04 0432) Weight:  [92.5 kg (204 lb)] 92.5 kg (204 lb) (11/03 1113)  Intake/Output from previous day: 11/03 0701 - 11/04 0700 In: 827.1 [I.V.:827.1] Out: 1200 [Urine:1200] Intake/Output this shift: Total I/O In: 295.8 [I.V.:295.8] Out: 200 [Urine:200]   Physical Exam  Constitutional: He is well-developed, well-nourished, and in no distress.  Abdominal: Soft. There is no tenderness.  Vitals reviewed.   Lab Results:  Recent Labs    02/07/17 0553  WBC 7.4  HGB 13.0  HCT 38.8*  PLT 96*   BMET Recent Labs    02/07/17 0553  NA 140  K 3.7  CL 107  CO2 26   GLUCOSE 126*  BUN 17  CREATININE 1.45*  CALCIUM 8.0*   PT/INR No results for input(s): LABPROT, INR in the last 72 hours. ABG No results for input(s): PHART, HCO3 in the last 72 hours.  Invalid input(s): PCO2, PO2  Studies/Results: US Renal  Result Date: 02/06/2017 CLINICAL DATA:  Flank pain for 3 days. EXAM: RENAL / URINARY TRACT ULTRASOUND COMPLETE COMPARISON:  CT scan November 20, 2015 FINDINGS: Right Kidney: Length: 12.9 cm. There is a 3.2 cm simple cyst in the lower pole. No masses or obstruction identified. Left Kidney: Length: 12.8 cm. Echogenicity within normal limits. No mass or hydronephrosis visualized. Bladder: Poorly distended limiting evaluation.  No abnormalities are noted. IMPRESSION: 1. There is a simple cyst off the lower pole of the right kidney. No other abnormalities identified on this study. Electronically Signed   By: Dorise Bullion III M.D   On: 02/06/2017 12:18   Ct Renal Stone Study  Result Date: 02/06/2017 CLINICAL DATA:  Patient with right-sided flank pain.  Hematuria. EXAM: CT ABDOMEN AND PELVIS WITHOUT CONTRAST TECHNIQUE: Multidetector CT imaging of the abdomen and pelvis was performed following the standard protocol without IV contrast. COMPARISON:  CT abdomen pelvis 11/20/2015. FINDINGS: Lower chest: Normal heart size. Dependent atelectasis within the lower lobes bilaterally. No pleural effusion. Hepatobiliary: The liver is low in attenuation compatible with steatosis. Sludge within the gallbladder lumen. No intrahepatic or extrahepatic biliary ductal dilatation. Pancreas: Unremarkable Spleen: Unremarkable Adrenals/Urinary Tract: Stable appearance of the adrenal glands. Bilateral perinephric fat stranding. Multiple stones demonstrated within the left kidney with a conglomerate of stones measuring up to 1.7 cm within the inferior pole (image 38; series 2). No left-sided hydronephrosis. There is moderate right hydroureteronephrosis to the level of the urinary  bladder/distal right ureter were there is an obstructing 4 mm stone at the UVJ (image 69; series 2). The urinary bladder is relatively decompressed. There is a 3.9 cm exophytic cyst off the interpolar region of the right kidney. Stomach/Bowel: Descending and sigmoid colonic diverticulosis. No CT evidence for acute diverticulitis. Normal appendix. Normal morphology of the stomach. No free intraperitoneal air. Small amount of fluid within the right pericolic gutter and pelvis. Vascular/Lymphatic: Normal caliber  abdominal aorta. Peripheral calcified atherosclerotic plaque. No retroperitoneal lymphadenopathy. Reproductive: Prostate unremarkable. Other: Bilateral fat containing inguinal hernias. Musculoskeletal: Lumbar spine degenerative changes. No aggressive or acute appearing osseous lesions. IMPRESSION: 1. There is an obstructing 4 mm stone at the right UVJ resulting in moderate right hydronephrosis. 2. Multiple nonobstructing left-sided renal stones. 3. Hepatic steatosis. Electronically Signed   By: Lovey Newcomer M.D.   On: 02/06/2017 15:01   Labs reviewed.     Assessment and Plan: Right ureteral stone with ARI.   He has probably passed his stone and is now pain free with a declining Cr.   He will be set up for a f/u appt with [facility].       LOS: 1 day    Alvino Lechuga J 02/07/2017 [id number]

## 2017-02-07 NOTE — Discharge Summary (Signed)
[facility] NAME: [name] [name]    MR#:  [id number]  DATE OF BIRTH:  Jan 17, 1961  DATE OF ADMISSION:  02/06/2017   ADMITTING PHYSICIAN: Demetrios Loll, MD  DATE OF DISCHARGE: 02/07/17  PRIMARY CARE PHYSICIAN: Kirk Ruths, MD   ADMISSION DIAGNOSIS:   Ureterolithiasis [N20.1] AKI (acute kidney injury) ([place]) [N17.9] Acute right flank pain [R10.9] Hydronephrosis with urinary obstruction due to ureteral calculus [N13.2]  DISCHARGE DIAGNOSIS:   Active Problems:   ARF (acute renal failure) ([place])   SECONDARY DIAGNOSIS:   Past Medical History:  Diagnosis Date  . Chest pain    Unspecified  . Chronic kidney disease   . Hypertension   . Hypertriglyceridemia   . Kidney stone on left side   . Palpitations   . Peripheral vascular disease [facility])     HOSPITAL COURSE:   56 year old male with known history of nephrolithiasis, hypertension, peripheral vascular disease, hyperlipidemia was brought in due to right flank pain and also elevated creatinine.  #1 acute renal failure-secondary to obstructing ureteral stone at the right UVJ junction. -IV fluids were given, nephrology was following -Had mild hydronephrosis on the right kidney on admission. However seems like he has passed the stone, hematuria has resolved and renal function is improving. -Patient is very anxious and wants to be discharged. Will have PCP follow-up his labs next week. Advised to drink plenty of fluids at this time.  #2 nephrolithiasis-right-sided obstructing 4 mm stone was noted, however patient felt like he passed it. Urine was not straining so no evidence of it. Pain resolved, WBC normalized and renal function improving. No fevers noted. -Appreciate urology consult. Outpatient follow-up recommended -No indication for antibiotics at this time as urine without any bacteria.  -Discharge on Flomax  #3 hypertension-patient on Cardizem, metoprolol. Will  continue that  #4 GERD-Protonix  #5 peripheral vascular disease-on aspirin and Plavix.  Patient is stable and is being discharged  DISCHARGE CONDITIONS:   Stable  CONSULTS OBTAINED:   Treatment Team:  Franchot Gallo, MD Anthonette Legato, MD  DRUG ALLERGIES:   Allergies  Allergen Reactions  . Fenofibrate Other (See Comments)    Reaction: causes abdominal pain   DISCHARGE MEDICATIONS:   Allergies as of 02/07/2017      Reactions   Fenofibrate Other (See Comments)   Reaction: causes abdominal pain      Medication List    STOP taking these medications   HYDROcodone-acetaminophen 5-325 MG tablet Commonly known as:  NORCO/VICODIN   meclizine 25 MG tablet Commonly known as:  ANTIVERT   ondansetron 4 MG disintegrating tablet Commonly known as:  ZOFRAN ODT   ondansetron 4 MG tablet Commonly known as:  ZOFRAN   oxyCODONE-acetaminophen 5-325 MG tablet Commonly known as:  ROXICET   thiamine 100 MG tablet     TAKE these medications   aspirin EC 81 MG tablet Take 81 mg by mouth daily.   clopidogrel 75 MG tablet Commonly known as:  PLAVIX Take 75 mg by mouth daily.   diltiazem 120 MG 24 hr capsule Commonly known as:  CARDIZEM CD Take 120 mg by mouth daily.   metoprolol 200 MG 24 hr tablet Commonly known as:  TOPROL-XL Take 200 mg by mouth daily.   omega-3 acid ethyl esters 1 g capsule Commonly known as:  LOVAZA Take 4 g by mouth daily.   pantoprazole 40 MG tablet Commonly known as:  PROTONIX Take 40 mg by mouth daily.  rosuvastatin 40 MG tablet Commonly known as:  CRESTOR Take 40 mg by mouth every evening.   tamsulosin 0.4 MG Caps capsule Commonly known as:  FLOMAX Take 1 capsule (0.4 mg total) daily by mouth.        DISCHARGE INSTRUCTIONS:   1. Urology follow-up in 1-2 weeks 2. PCP follow-up in 2-3 weeks  DIET:   Cardiac diet  ACTIVITY:   Activity as tolerated  OXYGEN:   Home Oxygen: No.  Oxygen Delivery: room  air  DISCHARGE LOCATION:   home   If you experience worsening of your admission symptoms, develop shortness of breath, life threatening emergency, suicidal or homicidal thoughts you must seek medical attention immediately by calling 911 or calling your MD immediately  if symptoms less severe.  You Must read complete instructions/literature along with all the possible adverse reactions/side effects for all the Medicines you take and that have been prescribed to you. Take any new Medicines after you have completely understood and accpet all the possible adverse reactions/side effects.   Please note  You were cared for by a hospitalist during your hospital stay. If you have any questions about your discharge medications or the care you received while you were in the hospital after you are discharged, you can call the unit and asked to speak with the hospitalist on call if the hospitalist that took care of you is not available. Once you are discharged, your primary care physician will handle any further medical issues. Please note that NO REFILLS for any discharge medications will be authorized once you are discharged, as it is imperative that you return to your primary care physician (or establish a relationship with a primary care physician if you do not have one) for your aftercare needs so that they can reassess your need for medications and monitor your lab values.    On the day of Discharge:  VITAL SIGNS:   Blood pressure 136/70, pulse 95, temperature 98 F (36.7 C), temperature source Oral, resp. rate 18, height 6' (1.829 m), weight 92.5 kg (204 lb), SpO2 97 %.  PHYSICAL EXAMINATION:    GENERAL:  56 y.o.-year-old patient lying in the bed with no acute distress.  EYES: Pupils equal, round, reactive to light and accommodation. No scleral icterus. Extraocular muscles intact.  HEENT: Head atraumatic, normocephalic. Oropharynx and nasopharynx clear.  NECK:  Supple, no jugular venous  distention. No thyroid enlargement, no tenderness.  LUNGS: Normal breath sounds bilaterally, no wheezing, rales,rhonchi or crepitation. No use of accessory muscles of respiration.  CARDIOVASCULAR: S1, S2 normal. No murmurs, rubs, or gallops.  ABDOMEN: Soft, non-tender, non-distended. Bowel sounds present. No organomegaly or mass.  EXTREMITIES: No pedal edema, cyanosis, or clubbing.  NEUROLOGIC: Cranial nerves II through XII are intact. Muscle strength 5/5 in all extremities. Sensation intact. Gait not checked.  PSYCHIATRIC: The patient is alert and oriented x 3.  SKIN: No obvious rash, lesion, or ulcer.   DATA REVIEW:   CBC Recent Labs  Lab 02/07/17 0553  WBC 7.4  HGB 13.0  HCT 38.8*  PLT 96*    Chemistries  Recent Labs  Lab 02/07/17 0553  NA 140  K 3.7  CL 107  CO2 26  GLUCOSE 126*  BUN 17  CREATININE 1.45*  CALCIUM 8.0*     Microbiology Results  Results for orders placed or performed during the hospital encounter of 10/19/15  Giardia/Cryptosporidium EIA     Status: None   Collection Time: 10/22/15  1:40 AM  Result Value  Ref Range Status   Giardia Ag, Stl Negative Negative Final    Comment: (NOTE) Performed At: [facility] [place], [place] [id number] Lindon Romp MD [id number]    Cryptosporidium EIA Negative Negative Final    Comment: (NOTE) Performed At: [facility] [place], [place] [id number] Lindon Romp MD [id number]   CULTURE, BLOOD (ROUTINE X 2) w Reflex to ID Panel     Status: None   Collection Time: 10/25/15 10:15 AM  Result Value Ref Range Status   Specimen Description BLOOD LEFT HAND  Final   Special Requests   Final    BOTTLES DRAWN AEROBIC AND ANAEROBIC  AEROBIC 3CC, ANAEROBIC 1CC   Culture NO GROWTH 5 DAYS  Final   Report Status 10/30/2015 FINAL  Final  CULTURE, BLOOD (ROUTINE X 2) w Reflex to ID Panel     Status: None   Collection Time: 10/25/15 10:16 AM  Result Value Ref Range  Status   Specimen Description BLOOD LEFT ASSIST CONTROL  Final   Special Requests BOTTLES DRAWN AEROBIC AND ANAEROBIC [place]  Final   Culture NO GROWTH 5 DAYS  Final   Report Status 10/30/2015 FINAL  Final  Urine culture     Status: Abnormal   Collection Time: 10/25/15  6:32 PM  Result Value Ref Range Status   Specimen Description URINE, CLEAN CATCH  Final   Special Requests Normal  Final   Culture MULTIPLE SPECIES PRESENT, SUGGEST RECOLLECTION (A)  Final   Report Status 10/27/2015 FINAL  Final  Body fluid culture     Status: None   Collection Time: 10/26/15  4:32 PM  Result Value Ref Range Status   Specimen Description SYNOVIAL ELBOW  Final   Special Requests Normal  Final   Gram Stain   Final    ABUNDANT WBC PRESENT, PREDOMINANTLY PMN NO ORGANISMS SEEN    Culture   Final    No growth aerobically or anaerobically. Performed at [facility]    Report Status 10/29/2015 FINAL  Final  Body fluid culture     Status: None   Collection Time: 10/29/15  6:01 PM  Result Value Ref Range Status   Specimen Description SYNOVIAL  Final   Special Requests RIGHT ELBOW  Final   Gram Stain   Final    NO ORGANISMS SEEN MODERATE WBC PRESENT, PREDOMINANTLY PMN    Culture   Final    No growth aerobically or anaerobically. Performed at [facility]    Report Status 11/02/2015 FINAL  Final    RADIOLOGY:  US Renal  Result Date: 02/06/2017 CLINICAL DATA:  Flank pain for 3 days. EXAM: RENAL / URINARY TRACT ULTRASOUND COMPLETE COMPARISON:  CT scan November 20, 2015 FINDINGS: Right Kidney: Length: 12.9 cm. There is a 3.2 cm simple cyst in the lower pole. No masses or obstruction identified. Left Kidney: Length: 12.8 cm. Echogenicity within normal limits. No mass or hydronephrosis visualized. Bladder: Poorly distended limiting evaluation.  No abnormalities are noted. IMPRESSION: 1. There is a simple cyst off the lower pole of the right kidney. No other abnormalities identified on this study.  Electronically Signed   By: Dorise Bullion III M.D   On: 02/06/2017 12:18   Ct Renal Stone Study  Result Date: 02/06/2017 CLINICAL DATA:  Patient with right-sided flank pain.  Hematuria. EXAM: CT ABDOMEN AND PELVIS WITHOUT CONTRAST TECHNIQUE: Multidetector CT imaging of the abdomen and pelvis was performed following the standard protocol without IV contrast.  COMPARISON:  CT abdomen pelvis 11/20/2015. FINDINGS: Lower chest: Normal heart size. Dependent atelectasis within the lower lobes bilaterally. No pleural effusion. Hepatobiliary: The liver is low in attenuation compatible with steatosis. Sludge within the gallbladder lumen. No intrahepatic or extrahepatic biliary ductal dilatation. Pancreas: Unremarkable Spleen: Unremarkable Adrenals/Urinary Tract: Stable appearance of the adrenal glands. Bilateral perinephric fat stranding. Multiple stones demonstrated within the left kidney with a conglomerate of stones measuring up to 1.7 cm within the inferior pole (image 38; series 2). No left-sided hydronephrosis. There is moderate right hydroureteronephrosis to the level of the urinary bladder/distal right ureter were there is an obstructing 4 mm stone at the UVJ (image 69; series 2). The urinary bladder is relatively decompressed. There is a 3.9 cm exophytic cyst off the interpolar region of the right kidney. Stomach/Bowel: Descending and sigmoid colonic diverticulosis. No CT evidence for acute diverticulitis. Normal appendix. Normal morphology of the stomach. No free intraperitoneal air. Small amount of fluid within the right pericolic gutter and pelvis. Vascular/Lymphatic: Normal caliber abdominal aorta. Peripheral calcified atherosclerotic plaque. No retroperitoneal lymphadenopathy. Reproductive: Prostate unremarkable. Other: Bilateral fat containing inguinal hernias. Musculoskeletal: Lumbar spine degenerative changes. No aggressive or acute appearing osseous lesions. IMPRESSION: 1. There is an obstructing 4 mm  stone at the right UVJ resulting in moderate right hydronephrosis. 2. Multiple nonobstructing left-sided renal stones. 3. Hepatic steatosis. Electronically Signed   By: Lovey Newcomer M.D.   On: 02/06/2017 15:01     Management plans discussed with the patient, family and they are in agreement.  CODE STATUS:     Code Status Orders  (From admission, onward)        Start     Ordered   02/06/17 1720  Full code  Continuous     02/06/17 1719    Code Status History    Date Active Date Inactive Code Status Order ID Comments User Context   10/19/2015 07:56 10/30/2015 18:00 Full Code [id number]  Harrie Foreman, MD Inpatient      TOTAL TIME TAKING CARE OF THIS PATIENT: 37 minutes.    Gladstone Lighter M.D on 02/07/2017 at 11:35 AM  Between 7am to 6pm - Pager - [phone]  After 6pm go to [email or web address] - [employer] [facility]  Office  [phone]  CC: Primary care physician; Kirk Ruths, MD   Note: This dictation was prepared with Dragon dictation along with smaller phrase technology. Any transcriptional errors that result from this process are unintentional.

## 2017-02-08 ENCOUNTER — Telehealth: Payer: Self-pay | Admitting: Internal Medicine

## 2017-02-08 LAB — URINE CULTURE: CULTURE: NO GROWTH

## 2017-02-08 NOTE — Telephone Encounter (Signed)
Copied from [place] [phone]. Topic: Medical Record Request - Patient ROI Request >> Feb 08, 2017 12:47 PM Cleaster Corin, [place] wrote: Patient Name/DOB/MRN #: Gerlad, Pelzel   09/27/1960   [id number] Requestor Name/Agency: patient Call Back #: [phone] Information Requested: med. Records (lab results imagining results per last hospital stay)   Route to [facility] for [place] clinics. For all other clinics, route to the clinic's PEC Pool.

## 2017-02-09 ENCOUNTER — Telehealth: Payer: Self-pay | Admitting: Urology

## 2017-02-09 LAB — HIV ANTIBODY (ROUTINE TESTING W REFLEX): HIV SCREEN 4TH GENERATION: NONREACTIVE

## 2017-02-09 NOTE — Telephone Encounter (Signed)
-----   Message from Irine Seal, MD sent at 02/07/2017  9:34 AM EST ----- [name] [name] is a former patient of Dr. Elnoria Howard with stones.   He needs a f/u appt in 2-3 weeks for follow up of a recent stone event.

## 2017-02-09 NOTE — Telephone Encounter (Signed)
App made LM for pt to CB to confirm   Mailed app  02-08-17 [facility]

## 2017-03-02 ENCOUNTER — Ambulatory Visit (INDEPENDENT_AMBULATORY_CARE_PROVIDER_SITE_OTHER): Payer: 59

## 2017-03-02 ENCOUNTER — Ambulatory Visit (INDEPENDENT_AMBULATORY_CARE_PROVIDER_SITE_OTHER): Payer: 59 | Admitting: Vascular Surgery

## 2017-03-02 ENCOUNTER — Encounter (INDEPENDENT_AMBULATORY_CARE_PROVIDER_SITE_OTHER): Payer: Self-pay

## 2017-03-02 ENCOUNTER — Encounter (INDEPENDENT_AMBULATORY_CARE_PROVIDER_SITE_OTHER): Payer: Self-pay | Admitting: Vascular Surgery

## 2017-03-02 VITALS — BP 141/86 | HR 77 | Resp 15 | Ht 72.0 in | Wt 207.0 lb

## 2017-03-02 DIAGNOSIS — I6523 Occlusion and stenosis of bilateral carotid arteries: Secondary | ICD-10-CM | POA: Diagnosis not present

## 2017-03-02 DIAGNOSIS — I10 Essential (primary) hypertension: Secondary | ICD-10-CM | POA: Diagnosis not present

## 2017-03-02 DIAGNOSIS — I70213 Atherosclerosis of native arteries of extremities with intermittent claudication, bilateral legs: Secondary | ICD-10-CM | POA: Diagnosis not present

## 2017-03-02 DIAGNOSIS — E781 Pure hyperglyceridemia: Secondary | ICD-10-CM

## 2017-03-02 NOTE — Progress Notes (Signed)
MRN : [id number]  [name] [name] is a 56 y.o. (November 10, 1960) male who presents with chief complaint of  Chief Complaint  Patient presents with  . Follow-up    6 month u/s follow up  .  History of Present Illness: Patient returns today in follow up of his PAD.  He is doing well.  Mild claudication symptoms at this time.  No rest pain or ulceration.  Had ABIs today which are stable at 0.88 on the right and 0.69 on the left.  Good distal waveforms bilaterally. Was admitted last month for AKI and nephrolithiasis.  Current Outpatient Medications  Medication Sig Dispense Refill  . aspirin EC 81 MG tablet Take 81 mg by mouth daily.    . clopidogrel (PLAVIX) 75 MG tablet Take 75 mg by mouth daily.    Marland Kitchen diltiazem (CARDIZEM CD) 120 MG 24 hr capsule Take 120 mg by mouth daily.     . metoprolol (TOPROL-XL) 200 MG 24 hr tablet Take 200 mg by mouth daily.     Marland Kitchen omega-3 acid ethyl esters (LOVAZA) 1 G capsule Take 4 g by mouth daily.     . pantoprazole (PROTONIX) 40 MG tablet Take 40 mg by mouth daily.    . rosuvastatin (CRESTOR) 40 MG tablet Take 40 mg by mouth every evening.    . tamsulosin (FLOMAX) 0.4 MG CAPS capsule Take 1 capsule (0.4 mg total) daily by mouth. 30 capsule 0   No current facility-administered medications for this visit.     Past Medical History:  Diagnosis Date  . Chest pain    Unspecified  . Chronic kidney disease   . Hypertension   . Hypertriglyceridemia   . Kidney stone on left side   . Palpitations   . Peripheral vascular disease [facility])     Past Surgical History:  Procedure Laterality Date  . ESOPHAGOGASTRODUODENOSCOPY (EGD) WITH PROPOFOL N/A 10/22/2015   Procedure: ESOPHAGOGASTRODUODENOSCOPY (EGD) WITH PROPOFOL;  Surgeon: Lucilla Lame, MD;  Location: [facility] ENDOSCOPY;  Service: Endoscopy;  Laterality: N/A;  . EXTRACORPOREAL SHOCK WAVE LITHOTRIPSY    . EXTRACORPOREAL SHOCK WAVE LITHOTRIPSY Left 09/13/2014   Procedure: EXTRACORPOREAL SHOCK WAVE LITHOTRIPSY (ESWL);   Surgeon: Hollice Espy, MD;  Location: [facility] ORS;  Service: Urology;  Laterality: Left;  . lower extremity interventions x2 right leg Right   . PERIPHERAL VASCULAR CATHETERIZATION Right 08/20/2014   Procedure: Lower Extremity Angiography;  Surgeon: Algernon Huxley, MD;  Location: [place] [facility];  Service: Cardiovascular;  Laterality: Right;  . PERIPHERAL VASCULAR CATHETERIZATION Right 08/20/2014   Procedure: Lower Extremity Intervention;  Surgeon: Algernon Huxley, MD;  Location: [place] [facility];  Service: Cardiovascular;  Laterality: Right;    Social History Social History   Tobacco Use  . Smoking status: Former Smoker    Packs/day: 1.00    Years: 37.00    Pack years: 37.00    Last attempt to quit: 04/21/2013    Years since quitting: 3.8  . Smokeless tobacco: Never Used  Substance Use Topics  . Alcohol use: Yes    Alcohol/week: 8.4 oz    Types: 7 Glasses of wine, 7 Cans of beer per week  . Drug use: No     Social History       Social History  Substance Use Topics  . Smoking status: Former Smoker    Packs/day: 1.00    Years: 37.00    Quit date: 04/21/2013  . Smokeless tobacco: Never Used  . Alcohol use 8.4  oz/week     7 Glasses of wine, 7 Cans of beer per week   No IVDU  Family History      Family History  Problem Relation Age of Onset  . Heart failure Unknown   No bleeding or clotting disorders, no aneurysms       Allergies  Allergen Reactions  . Fenofibrate Other (See Comments)    Reaction: causes abdominal pain     REVIEW OF SYSTEMS (Negative unless checked)  Constitutional: [] Weight loss  [] Fever  [] Chills Cardiac: [] Chest pain   [] Chest pressure   [] Palpitations   [] Shortness of breath when laying flat   [] Shortness of breath at rest   [] Shortness of breath with exertion. Vascular:  [x] Pain in legs with walking   [] Pain in legs at rest   [] Pain in legs when laying flat   [x] Claudication   [] Pain in feet when walking  [] Pain in feet at  rest  [] Pain in feet when laying flat   [] History of DVT   [] Phlebitis   [] Swelling in legs   [] Varicose veins   [] Non-healing ulcers Pulmonary:   [] Uses home oxygen   [] Productive cough   [] Hemoptysis   [] Wheeze  [] COPD   [] Asthma Neurologic:  [] Dizziness  [] Blackouts   [] Seizures   [] History of stroke   [] History of TIA  [] Aphasia   [] Temporary blindness   [] Dysphagia   [] Weakness or numbness in arms   [] Weakness or numbness in legs Musculoskeletal:  [] Arthritis   [] Joint swelling   [] Joint pain   [] Low back pain Hematologic:  [] Easy bruising  [] Easy bleeding   [] Hypercoagulable state   [] Anemic   Gastrointestinal:  [] Blood in stool   [] Vomiting blood  [] Gastroesophageal reflux/heartburn   [] Abdominal pain Genitourinary:  [x] Chronic kidney disease   [] Difficult urination  [] Frequent urination  [] Burning with urination   [] Hematuria Skin:  [] Rashes   [] Ulcers   [] Wounds Psychological:  [] History of anxiety   []  History of major depression.       Physical Examination  BP (!) 141/86 (BP Location: Right Arm, Patient Position: Sitting)   Pulse 77   Resp 15   Ht 6' (1.829 m)   Wt 93.9 kg (207 lb)   BMI 28.07 kg/m  Gen:  WD/WN, NAD Head: [place]/AT, No temporalis wasting. Ear/Nose/Throat: Hearing grossly intact, nares w/o erythema or drainage Eyes: Conjunctiva clear. Sclera non-icteric Neck: Supple.  Trachea midline Pulmonary:  Good air movement, no use of accessory muscles.  Cardiac: RRR, no JVD Vascular:  Vessel Right Left  Radial Palpable Palpable                          PT Palpable 1+ Palpable  DP Palpable 1+ Palpable    Musculoskeletal: M/S 5/5 throughout.  No deformity or atrophy.  Neurologic: Sensation grossly intact in extremities.  Symmetrical.  Speech is fluent.  Psychiatric: Judgment intact, Mood & affect appropriate for pt's clinical situation. Dermatologic: No rashes or ulcers noted.  No cellulitis or open wounds.       Labs Recent Results (from the past  2160 hour(s))  Urinalysis, Complete w Microscopic     Status: Abnormal   Collection Time: 02/06/17  2:39 PM  Result Value Ref Range   Color, Urine YELLOW (A) YELLOW   APPearance HAZY (A) CLEAR   Specific Gravity, Urine 1.019 1.005 - 1.030   pH 6.0 5.0 - 8.0   Glucose, UA NEGATIVE NEGATIVE mg/dL   Hgb urine dipstick  LARGE (A) NEGATIVE   Bilirubin Urine NEGATIVE NEGATIVE   Ketones, ur NEGATIVE NEGATIVE mg/dL   Protein, ur 30 (A) NEGATIVE mg/dL   Nitrite NEGATIVE NEGATIVE   Leukocytes, UA NEGATIVE NEGATIVE   RBC / HPF TOO NUMEROUS TO COUNT 0 - 5 RBC/hpf   WBC, UA 6-30 0 - 5 WBC/hpf   Bacteria, UA NONE SEEN NONE SEEN   Squamous Epithelial / LPF NONE SEEN NONE SEEN  Urine culture     Status: None   Collection Time: 02/06/17  3:30 PM  Result Value Ref Range   Specimen Description URINE, RANDOM    Special Requests NONE    Culture      NO GROWTH Performed at [facility], [place] [address], [place], [place] [address]    Report Status 02/08/2017 FINAL   Basic metabolic panel     Status: Abnormal   Collection Time: 02/07/17  5:53 AM  Result Value Ref Range   Sodium 140 135 - 145 mmol/L   Potassium 3.7 3.5 - 5.1 mmol/L   Chloride 107 101 - 111 mmol/L   CO2 26 22 - 32 mmol/L   Glucose, Bld 126 (H) 65 - 99 mg/dL   BUN 17 6 - 20 mg/dL   Creatinine, Ser 1.45 (H) 0.61 - 1.24 mg/dL   Calcium 8.0 (L) 8.9 - 10.3 mg/dL   GFR calc non Af Amer 52 (L) >60 mL/min   GFR calc Af Amer 60 (L) >60 mL/min    Comment: (NOTE) The eGFR has been calculated using the CKD EPI equation. This calculation has not been validated in all clinical situations. eGFR's persistently <60 mL/min signify possible Chronic Kidney Disease.    Anion gap 7 5 - 15  CBC     Status: Abnormal   Collection Time: 02/07/17  5:53 AM  Result Value Ref Range   WBC 7.4 3.8 - 10.6 K/uL   RBC 4.01 (L) 4.40 - 5.90 MIL/uL   Hemoglobin 13.0 13.0 - 18.0 g/dL   HCT 38.8 (L) 40.0 - 52.0 %   MCV 96.8 80.0 - 100.0 fL   MCH 32.3  26.0 - 34.0 pg   MCHC 33.4 32.0 - 36.0 g/dL   RDW 13.9 11.5 - 14.5 %   Platelets 96 (L) 150 - 440 K/uL  HIV antibody (Routine Testing)     Status: None   Collection Time: 02/07/17  5:53 AM  Result Value Ref Range   HIV Screen 4th Generation wRfx Non Reactive Non Reactive    Comment: (NOTE) Performed At: [facility] [place], [place] [id number] Rush Farmer MD IW:[id number]     Radiology US Renal  Result Date: 02/06/2017 CLINICAL DATA:  Flank pain for 3 days. EXAM: RENAL / URINARY TRACT ULTRASOUND COMPLETE COMPARISON:  CT scan November 20, 2015 FINDINGS: Right Kidney: Length: 12.9 cm. There is a 3.2 cm simple cyst in the lower pole. No masses or obstruction identified. Left Kidney: Length: 12.8 cm. Echogenicity within normal limits. No mass or hydronephrosis visualized. Bladder: Poorly distended limiting evaluation.  No abnormalities are noted. IMPRESSION: 1. There is a simple cyst off the lower pole of the right kidney. No other abnormalities identified on this study. Electronically Signed   By: Dorise Bullion III M.D   On: 02/06/2017 12:18   Ct Renal Stone Study  Result Date: 02/06/2017 CLINICAL DATA:  Patient with right-sided flank pain.  Hematuria. EXAM: CT ABDOMEN AND PELVIS WITHOUT CONTRAST TECHNIQUE: Multidetector CT imaging of the abdomen and  pelvis was performed following the standard protocol without IV contrast. COMPARISON:  CT abdomen pelvis 11/20/2015. FINDINGS: Lower chest: Normal heart size. Dependent atelectasis within the lower lobes bilaterally. No pleural effusion. Hepatobiliary: The liver is low in attenuation compatible with steatosis. Sludge within the gallbladder lumen. No intrahepatic or extrahepatic biliary ductal dilatation. Pancreas: Unremarkable Spleen: Unremarkable Adrenals/Urinary Tract: Stable appearance of the adrenal glands. Bilateral perinephric fat stranding. Multiple stones demonstrated within the left kidney with a conglomerate of  stones measuring up to 1.7 cm within the inferior pole (image 38; series 2). No left-sided hydronephrosis. There is moderate right hydroureteronephrosis to the level of the urinary bladder/distal right ureter were there is an obstructing 4 mm stone at the UVJ (image 69; series 2). The urinary bladder is relatively decompressed. There is a 3.9 cm exophytic cyst off the interpolar region of the right kidney. Stomach/Bowel: Descending and sigmoid colonic diverticulosis. No CT evidence for acute diverticulitis. Normal appendix. Normal morphology of the stomach. No free intraperitoneal air. Small amount of fluid within the right pericolic gutter and pelvis. Vascular/Lymphatic: Normal caliber abdominal aorta. Peripheral calcified atherosclerotic plaque. No retroperitoneal lymphadenopathy. Reproductive: Prostate unremarkable. Other: Bilateral fat containing inguinal hernias. Musculoskeletal: Lumbar spine degenerative changes. No aggressive or acute appearing osseous lesions. IMPRESSION: 1. There is an obstructing 4 mm stone at the right UVJ resulting in moderate right hydronephrosis. 2. Multiple nonobstructing left-sided renal stones. 3. Hepatic steatosis. Electronically Signed   By: Lovey Newcomer M.D.   On: 02/06/2017 15:01      Assessment/Plan Benign essential HTN blood pressure control important in reducing the progression of atherosclerotic disease. On appropriate oral medications.   HYPERTRIGLYCERIDEMIA lipid control important in reducing the progression of atherosclerotic disease. Continue statin therapy   Carotid stenosis His carotid duplex earlier this year reveals stable stenosis in the 1-39% range bilaterally in the carotid arteries. He will continue his aspirin, statin, and Plavix. Recheck in 1 year.   Atherosclerosis of native arteries of extremity with intermittent claudication ([place]) Had ABIs today which are stable at 0.88 on the right and 0.69 on the left.  Good distal waveforms  bilaterally. No new symptoms.  Continue ASA/Plavix/Crestor.  No need for intervention at this time.  Recheck in 6 months.    Leotis Pain, MD  03/03/2017 1:30 PM    This note was created with Dragon medical transcription system.  Any errors from dictation are purely unintentional

## 2017-03-03 NOTE — Patient Instructions (Signed)

## 2017-03-03 NOTE — Assessment & Plan Note (Signed)
Had ABIs today which are stable at 0.88 on the right and 0.69 on the left.  Good distal waveforms bilaterally. No new symptoms.  Continue ASA/Plavix/Crestor.  No need for intervention at this time.  Recheck in 6 months.

## 2017-03-05 ENCOUNTER — Ambulatory Visit: Payer: 59

## 2017-03-12 ENCOUNTER — Other Ambulatory Visit: Payer: Self-pay | Admitting: Radiology

## 2017-03-12 ENCOUNTER — Ambulatory Visit (INDEPENDENT_AMBULATORY_CARE_PROVIDER_SITE_OTHER): Payer: 59 | Admitting: Urology

## 2017-03-12 VITALS — BP 144/89 | HR 82 | Ht 72.0 in | Wt 206.3 lb

## 2017-03-12 DIAGNOSIS — N200 Calculus of kidney: Secondary | ICD-10-CM

## 2017-03-12 LAB — URINALYSIS, COMPLETE
BILIRUBIN UA: NEGATIVE
Glucose, UA: NEGATIVE
Leukocytes, UA: NEGATIVE
NITRITE UA: NEGATIVE
PH UA: 6 (ref 5.0–7.5)
Protein, UA: NEGATIVE
RBC UA: NEGATIVE
Specific Gravity, UA: 1.02 (ref 1.005–1.030)
UUROB: 0.2 mg/dL (ref 0.2–1.0)

## 2017-03-12 LAB — MICROSCOPIC EXAMINATION

## 2017-03-12 NOTE — Progress Notes (Signed)
03/12/2017 10:04 AM   [name] March 22, 1961 [id number]  Referring provider: Kirk Ruths, MD [place] [facility] [place], [place] [address]  Chief Complaint  Patient presents with  . Nephrolithiasis    HPI: The patient is a 56 year old gentleman presents today for hospital follow-up after being diagnosed with a right 4 mm UVJ stone.  He believes that he passed it with medical expulsive therapy.  He also has a cluster of multiple 4-5 mm left lower pole stones and solitary left nonobstructing 4 mm upper pole stone seen on CT. the patient feels that symptomatically has passed his stone but when he was in the hospital his urine was not strained appropriately.  He has no current flank pain or hematuria.  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  He is very concerned about his residual stone burden.  He has had lithotripsy in the past.   PMH: Past Medical History:  Diagnosis Date  . Chest pain    Unspecified  . Chronic kidney disease   . Hypertension   . Hypertriglyceridemia   . Kidney stone on left side   . Palpitations   . Peripheral vascular disease [facility])     Surgical History: Past Surgical History:  Procedure Laterality Date  . ESOPHAGOGASTRODUODENOSCOPY (EGD) WITH PROPOFOL N/A 10/22/2015   Procedure: ESOPHAGOGASTRODUODENOSCOPY (EGD) WITH PROPOFOL;  Surgeon: Lucilla Lame, MD;  Location: [facility] ENDOSCOPY;  Service: Endoscopy;  Laterality: N/A;  . EXTRACORPOREAL SHOCK WAVE LITHOTRIPSY    . EXTRACORPOREAL SHOCK WAVE LITHOTRIPSY Left 09/13/2014   Procedure: EXTRACORPOREAL SHOCK WAVE LITHOTRIPSY (ESWL);  Surgeon: Hollice Espy, MD;  Location: [facility] ORS;  Service: Urology;  Laterality: Left;  . lower extremity interventions x2 right leg Right   . PERIPHERAL VASCULAR CATHETERIZATION Right 08/20/2014   Procedure: Lower Extremity Angiography;  Surgeon: Algernon Huxley, MD;  Location: [place] [facility] LAB;  Service: Cardiovascular;  Laterality:  Right;  . PERIPHERAL VASCULAR CATHETERIZATION Right 08/20/2014   Procedure: Lower Extremity Intervention;  Surgeon: Algernon Huxley, MD;  Location: [place] [facility];  Service: Cardiovascular;  Laterality: Right;    Home Medications:  Allergies as of 03/12/2017      Reactions   Fenofibrate Other (See Comments)   Reaction: causes abdominal pain      Medication List        Accurate as of 03/12/17 10:03 AM. Always use your most recent med list.          aspirin EC 81 MG tablet Take 81 mg by mouth daily.   clopidogrel 75 MG tablet Commonly known as:  PLAVIX Take 75 mg by mouth daily.   diltiazem 120 MG 24 hr capsule Commonly known as:  CARDIZEM CD Take 120 mg by mouth daily.   metoprolol 200 MG 24 hr tablet Commonly known as:  TOPROL-XL Take 200 mg by mouth daily.   omega-3 acid ethyl esters 1 g capsule Commonly known as:  LOVAZA Take 4 g by mouth daily.   pantoprazole 40 MG tablet Commonly known as:  PROTONIX Take 40 mg by mouth daily.   rosuvastatin 40 MG tablet Commonly known as:  CRESTOR Take 40 mg by mouth every evening.       Allergies:  Allergies  Allergen Reactions  . Fenofibrate Other (See Comments)    Reaction: causes abdominal pain    Family History: Family History  Problem Relation Age of Onset  . Heart failure Unknown     Social History:  reports that he quit smoking about 3 years ago. He has a 37.00 pack-year smoking history. he has never used smokeless tobacco. He reports that he drinks about 8.4 oz of alcohol per week. He reports that he does not use drugs.  ROS: UROLOGY Frequent Urination?: No Hard to postpone urination?: No Burning/pain with urination?: No Get up at night to urinate?: No Leakage of urine?: No Urine stream starts and stops?: No Trouble starting stream?: No Do you have to strain to urinate?: No Blood in urine?: No Urinary tract infection?: No Sexually transmitted disease?: No Injury to kidneys or bladder?:  No Painful intercourse?: No Weak stream?: No Erection problems?: No Penile pain?: No  Gastrointestinal Nausea?: No Vomiting?: No Indigestion/heartburn?: No Diarrhea?: No Constipation?: No  Constitutional Fever: No Night sweats?: No Weight loss?: No Fatigue?: No  Skin Skin rash/lesions?: Yes Itching?: No  Eyes Blurred vision?: No Double vision?: No  Ears/Nose/Throat Sore throat?: No Sinus problems?: No  Hematologic/Lymphatic Swollen glands?: No Easy bruising?: Yes  Cardiovascular Leg swelling?: No Chest pain?: No  Respiratory Cough?: No Shortness of breath?: No  Endocrine Excessive thirst?: No  Musculoskeletal Back pain?: No Joint pain?: Yes  Neurological Headaches?: No Dizziness?: Yes  Psychologic Depression?: No Anxiety?: No  Physical Exam: BP (!) 144/89   Pulse 82   Ht 6' (1.829 m)   Wt 206 lb 4.8 oz (93.6 kg)   BMI 27.98 kg/m   Constitutional:  Alert and oriented, No acute distress. HEENT: [place] AT, moist mucus membranes.  Trachea midline, no masses. Cardiovascular: No clubbing, cyanosis, or edema. Respiratory: Normal respiratory effort, no increased work of breathing. GI: Abdomen is soft, nontender, nondistended, no abdominal masses GU: No CVA tenderness.  Skin: No rashes, bruises or suspicious lesions. Lymph: No cervical or inguinal adenopathy. Neurologic: Grossly intact, no focal deficits, moving all 4 extremities. Psychiatric: Normal mood and affect.  Laboratory Data: Lab Results  Component Value Date   WBC 7.4 02/07/2017   HGB 13.0 02/07/2017   HCT 38.8 (L) 02/07/2017   MCV 96.8 02/07/2017   PLT 96 (L) 02/07/2017    Lab Results  Component Value Date   CREATININE 1.45 (H) 02/07/2017    No results found for: PSA  No results found for: TESTOSTERONE  Lab Results  Component Value Date   HGBA1C 5.8 10/19/2015    Urinalysis    Component Value Date/Time   COLORURINE YELLOW (A) 02/06/2017 1439   APPEARANCEUR HAZY (A)  02/06/2017 1439   APPEARANCEUR Hazy 04/23/2014 0935   LABSPEC 1.019 02/06/2017 1439   LABSPEC 1.014 04/23/2014 0935   PHURINE 6.0 02/06/2017 1439   GLUCOSEU NEGATIVE 02/06/2017 1439   GLUCOSEU Negative 04/23/2014 0935   HGBUR LARGE (A) 02/06/2017 1439   BILIRUBINUR NEGATIVE 02/06/2017 1439   BILIRUBINUR Negative 04/23/2014 0935   KETONESUR NEGATIVE 02/06/2017 1439   PROTEINUR 30 (A) 02/06/2017 1439   NITRITE NEGATIVE 02/06/2017 1439   LEUKOCYTESUR NEGATIVE 02/06/2017 1439   LEUKOCYTESUR Negative 04/23/2014 0935    Pertinent Imaging: CT reviewed as above  Assessment & Plan:    1. Left nonobstructing renal calculi I discussed with the patient methods to treat his stones including attempted dissolution therapy versus ureteroscopy.  Patient is very concerned about having residual stone burden and would like to have them treated surgically.  Due to the number, he is not a great candidate for lithotripsy.  He has elected to proceed with cystoscopy, left ureteroscopy, laser lithotripsy, left ureteral stent.  We discussed the risk, benefits, indications of  this procedure.  He understands the risks include but are not limited to bleeding, infection, iatrogenic injury, need for ureteral stent, need for repeat procedures.  All questions were answered.  The patient has elected to proceed.  2. Right UVJ stone Will perform right retrograde pyelogram at the time of his above procedure to ensure the stone has passed as he has not seen the stone.  3.  Uric acid nephrolithiasis We will need to discuss initiating potassium citrate therapy in the future to prevent future stones.  However, his urine pH is 6.0 which is not terribly low at this time.   Nickie Retort, MD  [facility] [address] [place], [place] [address] [phone]

## 2017-03-12 NOTE — H&P (View-Only) (Signed)
03/12/2017 10:04 AM   [name] 08/10/1960 [id number]  Referring provider: Kirk Ruths, MD [place] [facility] [place], [place] [address]  Chief Complaint  Patient presents with  . Nephrolithiasis    HPI: The patient is a 56 year old gentleman presents today for hospital follow-up after being diagnosed with a right 4 mm UVJ stone.  He believes that he passed it with medical expulsive therapy.  He also has a cluster of multiple 4-5 mm left lower pole stones and solitary left nonobstructing 4 mm upper pole stone seen on CT. the patient feels that symptomatically has passed his stone but when he was in the hospital his urine was not strained appropriately.  He has no current flank pain or hematuria.  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  He is very concerned about his residual stone burden.  He has had lithotripsy in the past.   PMH: Past Medical History:  Diagnosis Date  . Chest pain    Unspecified  . Chronic kidney disease   . Hypertension   . Hypertriglyceridemia   . Kidney stone on left side   . Palpitations   . Peripheral vascular disease [facility])     Surgical History: Past Surgical History:  Procedure Laterality Date  . ESOPHAGOGASTRODUODENOSCOPY (EGD) WITH PROPOFOL N/A 10/22/2015   Procedure: ESOPHAGOGASTRODUODENOSCOPY (EGD) WITH PROPOFOL;  Surgeon: Lucilla Lame, MD;  Location: [facility] ENDOSCOPY;  Service: Endoscopy;  Laterality: N/A;  . EXTRACORPOREAL SHOCK WAVE LITHOTRIPSY    . EXTRACORPOREAL SHOCK WAVE LITHOTRIPSY Left 09/13/2014   Procedure: EXTRACORPOREAL SHOCK WAVE LITHOTRIPSY (ESWL);  Surgeon: Hollice Espy, MD;  Location: [facility] ORS;  Service: Urology;  Laterality: Left;  . lower extremity interventions x2 right leg Right   . PERIPHERAL VASCULAR CATHETERIZATION Right 08/20/2014   Procedure: Lower Extremity Angiography;  Surgeon: Algernon Huxley, MD;  Location: [place] CV LAB;  Service: Cardiovascular;  Laterality:  Right;  . PERIPHERAL VASCULAR CATHETERIZATION Right 08/20/2014   Procedure: Lower Extremity Intervention;  Surgeon: Algernon Huxley, MD;  Location: [place] CV LAB;  Service: Cardiovascular;  Laterality: Right;    Home Medications:  Allergies as of 03/12/2017      Reactions   Fenofibrate Other (See Comments)   Reaction: causes abdominal pain      Medication List        Accurate as of 03/12/17 10:03 AM. Always use your most recent med list.          aspirin EC 81 MG tablet Take 81 mg by mouth daily.   clopidogrel 75 MG tablet Commonly known as:  PLAVIX Take 75 mg by mouth daily.   diltiazem 120 MG 24 hr capsule Commonly known as:  CARDIZEM CD Take 120 mg by mouth daily.   metoprolol 200 MG 24 hr tablet Commonly known as:  TOPROL-XL Take 200 mg by mouth daily.   omega-3 acid ethyl esters 1 g capsule Commonly known as:  LOVAZA Take 4 g by mouth daily.   pantoprazole 40 MG tablet Commonly known as:  PROTONIX Take 40 mg by mouth daily.   rosuvastatin 40 MG tablet Commonly known as:  CRESTOR Take 40 mg by mouth every evening.       Allergies:  Allergies  Allergen Reactions  . Fenofibrate Other (See Comments)    Reaction: causes abdominal pain    Family History: Family History  Problem Relation Age of Onset  . Heart failure Unknown     Social History:  reports that he quit smoking about 3 years ago. He has a 37.00 pack-year smoking history. he has never used smokeless tobacco. He reports that he drinks about 8.4 oz of alcohol per week. He reports that he does not use drugs.  ROS: UROLOGY Frequent Urination?: No Hard to postpone urination?: No Burning/pain with urination?: No Get up at night to urinate?: No Leakage of urine?: No Urine stream starts and stops?: No Trouble starting stream?: No Do you have to strain to urinate?: No Blood in urine?: No Urinary tract infection?: No Sexually transmitted disease?: No Injury to kidneys or bladder?:  No Painful intercourse?: No Weak stream?: No Erection problems?: No Penile pain?: No  Gastrointestinal Nausea?: No Vomiting?: No Indigestion/heartburn?: No Diarrhea?: No Constipation?: No  Constitutional Fever: No Night sweats?: No Weight loss?: No Fatigue?: No  Skin Skin rash/lesions?: Yes Itching?: No  Eyes Blurred vision?: No Double vision?: No  Ears/Nose/Throat Sore throat?: No Sinus problems?: No  Hematologic/Lymphatic Swollen glands?: No Easy bruising?: Yes  Cardiovascular Leg swelling?: No Chest pain?: No  Respiratory Cough?: No Shortness of breath?: No  Endocrine Excessive thirst?: No  Musculoskeletal Back pain?: No Joint pain?: Yes  Neurological Headaches?: No Dizziness?: Yes  Psychologic Depression?: No Anxiety?: No  Physical Exam: BP (!) 144/89   Pulse 82   Ht 6' (1.829 m)   Wt 206 lb 4.8 oz (93.6 kg)   BMI 27.98 kg/m   Constitutional:  Alert and oriented, No acute distress. HEENT: [place] AT, moist mucus membranes.  Trachea midline, no masses. Cardiovascular: No clubbing, cyanosis, or edema. Respiratory: Normal respiratory effort, no increased work of breathing. GI: Abdomen is soft, nontender, nondistended, no abdominal masses GU: No CVA tenderness.  Skin: No rashes, bruises or suspicious lesions. Lymph: No cervical or inguinal adenopathy. Neurologic: Grossly intact, no focal deficits, moving all 4 extremities. Psychiatric: Normal mood and affect.  Laboratory Data: Lab Results  Component Value Date   WBC 7.4 02/07/2017   HGB 13.0 02/07/2017   HCT 38.8 (L) 02/07/2017   MCV 96.8 02/07/2017   PLT 96 (L) 02/07/2017    Lab Results  Component Value Date   CREATININE 1.45 (H) 02/07/2017    No results found for: PSA  No results found for: TESTOSTERONE  Lab Results  Component Value Date   HGBA1C 5.8 10/19/2015    Urinalysis    Component Value Date/Time   COLORURINE YELLOW (A) 02/06/2017 1439   APPEARANCEUR HAZY (A)  02/06/2017 1439   APPEARANCEUR Hazy 04/23/2014 0935   LABSPEC 1.019 02/06/2017 1439   LABSPEC 1.014 04/23/2014 0935   PHURINE 6.0 02/06/2017 1439   GLUCOSEU NEGATIVE 02/06/2017 1439   GLUCOSEU Negative 04/23/2014 0935   HGBUR LARGE (A) 02/06/2017 1439   BILIRUBINUR NEGATIVE 02/06/2017 1439   BILIRUBINUR Negative 04/23/2014 0935   KETONESUR NEGATIVE 02/06/2017 1439   PROTEINUR 30 (A) 02/06/2017 1439   NITRITE NEGATIVE 02/06/2017 1439   LEUKOCYTESUR NEGATIVE 02/06/2017 1439   LEUKOCYTESUR Negative 04/23/2014 0935    Pertinent Imaging: CT reviewed as above  Assessment & Plan:    1. Left nonobstructing renal calculi I discussed with the patient methods to treat his stones including attempted dissolution therapy versus ureteroscopy.  Patient is very concerned about having residual stone burden and would like to have them treated surgically.  Due to the number, he is not a great candidate for lithotripsy.  He has elected to proceed with cystoscopy, left ureteroscopy, laser lithotripsy, left ureteral stent.  We discussed the risk, benefits, indications of  this procedure.  He understands the risks include but are not limited to bleeding, infection, iatrogenic injury, need for ureteral stent, need for repeat procedures.  All questions were answered.  The patient has elected to proceed.  2. Right UVJ stone Will perform right retrograde pyelogram at the time of his above procedure to ensure the stone has passed as he has not seen the stone.  3.  Uric acid nephrolithiasis We will need to discuss initiating potassium citrate therapy in the future to prevent future stones.  However, his urine pH is 6.0 which is not terribly low at this time.   Nickie Retort, MD  [facility] [address] [place], [place] [address] [phone]

## 2017-03-12 NOTE — H&P (View-Only) (Signed)
03/12/2017 10:04 AM   [name] [name] 1960-08-27 [id number]  Referring provider: Kirk Ruths, MD [facility] [place], [place] [address]  Chief Complaint  Patient presents with  . Nephrolithiasis    HPI: The patient is a 56 year old gentleman presents today for hospital follow-up after being diagnosed with a right 4 mm UVJ stone.  He believes that he passed it with medical expulsive therapy.  He also has a cluster of multiple 4-5 mm left lower pole stones and solitary left nonobstructing 4 mm upper pole stone seen on CT. the patient feels that symptomatically has passed his stone but when he was in the hospital his urine was not strained appropriately.  He has no current flank pain or hematuria.  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  He is very concerned about his residual stone burden.  He has had lithotripsy in the past.   PMH: Past Medical History:  Diagnosis Date  . Chest pain    Unspecified  . Chronic kidney disease   . Hypertension   . Hypertriglyceridemia   . Kidney stone on left side   . Palpitations   . Peripheral vascular disease [facility])     Surgical History: Past Surgical History:  Procedure Laterality Date  . ESOPHAGOGASTRODUODENOSCOPY (EGD) WITH PROPOFOL N/A 10/22/2015   Procedure: ESOPHAGOGASTRODUODENOSCOPY (EGD) WITH PROPOFOL;  Surgeon: Lucilla Lame, MD;  Location: [facility] ENDOSCOPY;  Service: Endoscopy;  Laterality: N/A;  . EXTRACORPOREAL SHOCK WAVE LITHOTRIPSY    . EXTRACORPOREAL SHOCK WAVE LITHOTRIPSY Left 09/13/2014   Procedure: EXTRACORPOREAL SHOCK WAVE LITHOTRIPSY (ESWL);  Surgeon: Hollice Espy, MD;  Location: [facility] ORS;  Service: Urology;  Laterality: Left;  . lower extremity interventions x2 right leg Right   . PERIPHERAL VASCULAR CATHETERIZATION Right 08/20/2014   Procedure: Lower Extremity Angiography;  Surgeon: Algernon Huxley, MD;  Location: [place] CV LAB;  Service: Cardiovascular;  Laterality:  Right;  . PERIPHERAL VASCULAR CATHETERIZATION Right 08/20/2014   Procedure: Lower Extremity Intervention;  Surgeon: Algernon Huxley, MD;  Location: [place] CV LAB;  Service: Cardiovascular;  Laterality: Right;    Home Medications:  Allergies as of 03/12/2017      Reactions   Fenofibrate Other (See Comments)   Reaction: causes abdominal pain      Medication List        Accurate as of 03/12/17 10:03 AM. Always use your most recent med list.          aspirin EC 81 MG tablet Take 81 mg by mouth daily.   clopidogrel 75 MG tablet Commonly known as:  PLAVIX Take 75 mg by mouth daily.   diltiazem 120 MG 24 hr capsule Commonly known as:  CARDIZEM CD Take 120 mg by mouth daily.   metoprolol 200 MG 24 hr tablet Commonly known as:  TOPROL-XL Take 200 mg by mouth daily.   omega-3 acid ethyl esters 1 g capsule Commonly known as:  LOVAZA Take 4 g by mouth daily.   pantoprazole 40 MG tablet Commonly known as:  PROTONIX Take 40 mg by mouth daily.   rosuvastatin 40 MG tablet Commonly known as:  CRESTOR Take 40 mg by mouth every evening.       Allergies:  Allergies  Allergen Reactions  . Fenofibrate Other (See Comments)    Reaction: causes abdominal pain    Family History: Family History  Problem Relation Age of Onset  . Heart failure Unknown     Social History:  reports that he quit smoking about 3 years ago. He has a 37.00 pack-year smoking history. he has never used smokeless tobacco. He reports that he drinks about 8.4 oz of alcohol per week. He reports that he does not use drugs.  ROS: UROLOGY Frequent Urination?: No Hard to postpone urination?: No Burning/pain with urination?: No Get up at night to urinate?: No Leakage of urine?: No Urine stream starts and stops?: No Trouble starting stream?: No Do you have to strain to urinate?: No Blood in urine?: No Urinary tract infection?: No Sexually transmitted disease?: No Injury to kidneys or bladder?:  No Painful intercourse?: No Weak stream?: No Erection problems?: No Penile pain?: No  Gastrointestinal Nausea?: No Vomiting?: No Indigestion/heartburn?: No Diarrhea?: No Constipation?: No  Constitutional Fever: No Night sweats?: No Weight loss?: No Fatigue?: No  Skin Skin rash/lesions?: Yes Itching?: No  Eyes Blurred vision?: No Double vision?: No  Ears/Nose/Throat Sore throat?: No Sinus problems?: No  Hematologic/Lymphatic Swollen glands?: No Easy bruising?: Yes  Cardiovascular Leg swelling?: No Chest pain?: No  Respiratory Cough?: No Shortness of breath?: No  Endocrine Excessive thirst?: No  Musculoskeletal Back pain?: No Joint pain?: Yes  Neurological Headaches?: No Dizziness?: Yes  Psychologic Depression?: No Anxiety?: No  Physical Exam: BP (!) 144/89   Pulse 82   Ht 6' (1.829 m)   Wt 206 lb 4.8 oz (93.6 kg)   BMI 27.98 kg/m   Constitutional:  Alert and oriented, No acute distress. HEENT: [place] AT, moist mucus membranes.  Trachea midline, no masses. Cardiovascular: No clubbing, cyanosis, or edema. Respiratory: Normal respiratory effort, no increased work of breathing. GI: Abdomen is soft, nontender, nondistended, no abdominal masses GU: No CVA tenderness.  Skin: No rashes, bruises or suspicious lesions. Lymph: No cervical or inguinal adenopathy. Neurologic: Grossly intact, no focal deficits, moving all 4 extremities. Psychiatric: Normal mood and affect.  Laboratory Data: Lab Results  Component Value Date   WBC 7.4 02/07/2017   HGB 13.0 02/07/2017   HCT 38.8 (L) 02/07/2017   MCV 96.8 02/07/2017   PLT 96 (L) 02/07/2017    Lab Results  Component Value Date   CREATININE 1.45 (H) 02/07/2017    No results found for: PSA  No results found for: TESTOSTERONE  Lab Results  Component Value Date   HGBA1C 5.8 10/19/2015    Urinalysis    Component Value Date/Time   COLORURINE YELLOW (A) 02/06/2017 1439   APPEARANCEUR HAZY (A)  02/06/2017 1439   APPEARANCEUR Hazy 04/23/2014 0935   LABSPEC 1.019 02/06/2017 1439   LABSPEC 1.014 04/23/2014 0935   PHURINE 6.0 02/06/2017 1439   GLUCOSEU NEGATIVE 02/06/2017 1439   GLUCOSEU Negative 04/23/2014 0935   HGBUR LARGE (A) 02/06/2017 1439   BILIRUBINUR NEGATIVE 02/06/2017 1439   BILIRUBINUR Negative 04/23/2014 0935   KETONESUR NEGATIVE 02/06/2017 1439   PROTEINUR 30 (A) 02/06/2017 1439   NITRITE NEGATIVE 02/06/2017 1439   LEUKOCYTESUR NEGATIVE 02/06/2017 1439   LEUKOCYTESUR Negative 04/23/2014 0935    Pertinent Imaging: CT reviewed as above  Assessment & Plan:    1. Left nonobstructing renal calculi I discussed with the patient methods to treat his stones including attempted dissolution therapy versus ureteroscopy.  Patient is very concerned about having residual stone burden and would like to have them treated surgically.  Due to the number, he is not a great candidate for lithotripsy.  He has elected to proceed with cystoscopy, left ureteroscopy, laser lithotripsy, left ureteral stent.  We discussed the risk, benefits, indications of  this procedure.  He understands the risks include but are not limited to bleeding, infection, iatrogenic injury, need for ureteral stent, need for repeat procedures.  All questions were answered.  The patient has elected to proceed.  2. Right UVJ stone Will perform right retrograde pyelogram at the time of his above procedure to ensure the stone has passed as he has not seen the stone.  3.  Uric acid nephrolithiasis We will need to discuss initiating potassium citrate therapy in the future to prevent future stones.  However, his urine pH is 6.0 which is not terribly low at this time.   Nickie Retort, MD  [facility] [address] [place],  [address] [phone]

## 2017-03-16 LAB — URINE CULTURE

## 2017-03-17 ENCOUNTER — Telehealth: Payer: Self-pay | Admitting: Radiology

## 2017-03-17 NOTE — Telephone Encounter (Signed)
Dr Bernardo Heater aware of ucx results from 03/12/17. No orders received at this time.

## 2017-03-18 ENCOUNTER — Encounter
Admission: RE | Admit: 2017-03-18 | Discharge: 2017-03-18 | Disposition: A | Discharge status: Home / Self Care | Payer: 59 | Source: Ambulatory Visit | Attending: Urology | Admitting: Urology

## 2017-03-18 ENCOUNTER — Other Ambulatory Visit: Payer: Self-pay

## 2017-03-18 DIAGNOSIS — Z01810 Encounter for preprocedural cardiovascular examination: Secondary | ICD-10-CM | POA: Insufficient documentation

## 2017-03-18 DIAGNOSIS — R7303 Prediabetes: Secondary | ICD-10-CM | POA: Diagnosis not present

## 2017-03-18 DIAGNOSIS — I10 Essential (primary) hypertension: Secondary | ICD-10-CM | POA: Insufficient documentation

## 2017-03-18 HISTORY — DX: Gastro-esophageal reflux disease without esophagitis: K21.9

## 2017-03-18 HISTORY — DX: Anemia, unspecified: D64.9

## 2017-03-18 HISTORY — DX: Prediabetes: R73.03

## 2017-03-18 NOTE — Patient Instructions (Signed)
Your procedure is scheduled on: Tuesday 03/23/17 Report to [facility]. 2ND FLOOR MEDICAL MALL ENTRANCE. To find out your arrival time please call [phone] between 1PM - 3PM on Monday 03/22/17.  Remember: Instructions that are not followed completely may result in serious medical risk, up to and including death, or upon the discretion of your surgeon and anesthesiologist your surgery may need to be rescheduled.    __X__ 1. Do not eat anything after midnight the night before your    procedure.  No gum chewing or hard candies.  You may drink clear   liquids up to 2 hours before you are scheduled to arrive at the   hospital for your procedure. Do not drink clear liquids within 2   hours of scheduled arrival to the hospital as this may lead to your   procedure being delayed or rescheduled.       Clear liquids include:   Water or Apple juice without pulp   Clear carbohydrate beverage such as Clearfast or Gatorade   Black coffee or Clear Tea (no milk, no creamer, do not add anything   to the coffee or tea)    Diabetics should only drink water   __X__ 2. No Alcohol for 24 hours before or after surgery.   ____ 3. Bring all medications with you on the day of surgery if instructed.    __X__ 4. Notify your doctor if there is any change in your medical condition     (cold, fever, infections).             __X___5. No smoking within 24 hours of your surgery.     Do not wear jewelry, make-up, hairpins, clips or nail polish.  Do not wear lotions, powders, or perfumes.   Do not shave 48 hours prior to surgery. Men may shave face and neck.  Do not bring valuables to the hospital.    [facility] is not responsible for any belongings or valuables.               Contacts, dentures or bridgework may not be worn into surgery.  Leave your suitcase in the car. After surgery it may be brought to your room.  For patients admitted to the hospital, discharge time is determined by your                 treatment team.   Patients discharged the day of surgery will not be allowed to drive home.   Please read over the following fact sheets that you were given:   MRSA Information   __X__ Take these medicines the morning of surgery with A SIP OF WATER:    1. DILTIAZEM  2. METOPROLOL  3. PANTOPRAZOLE  4.  5.  6.  ____ Fleet Enema (as directed)   ____ Use CHG Soap/SAGE wipes as directed  ____ Use inhalers on the day of surgery  ____ Stop metformin 2 days prior to surgery    ____ Take 1/2 of usual insulin dose the night before surgery and none on the morning of surgery.   __X__ Stop Coumadin/Plavix/aspirin on AS PREVIOUSLY INSTRUCTED  __X__ Stop Anti-inflammatories such as Advil, Aleve, Ibuprofen, Motrin, Naproxen, Naprosyn, Goodies,powder, or aspirin products.  OK to take Tylenol.   __X__ Stop supplements, Vitamin E, Fish Oil until after surgery.    ____ Bring C-Pap to the hospital.

## 2017-03-22 MED ORDER — CEFAZOLIN SODIUM-DEXTROSE 2-4 GM/100ML-% IV SOLN
2.0000 g | INTRAVENOUS | Status: AC
  Administered 2017-03-23: 2 g via INTRAVENOUS

## 2017-03-23 ENCOUNTER — Other Ambulatory Visit: Payer: Self-pay

## 2017-03-23 ENCOUNTER — Ambulatory Visit
Admission: RE | Admit: 2017-03-23 | Discharge: 2017-03-23 | Disposition: A | Discharge status: Home / Self Care | Payer: 59 | Source: Ambulatory Visit | Attending: Urology | Admitting: Urology

## 2017-03-23 ENCOUNTER — Ambulatory Visit: Payer: 59 | Admitting: Anesthesiology

## 2017-03-23 ENCOUNTER — Encounter
Admission: RE | Disposition: A | Discharge status: Home / Self Care | Payer: Self-pay | Source: Ambulatory Visit | Attending: Urology

## 2017-03-23 ENCOUNTER — Encounter: Payer: Self-pay | Admitting: *Deleted

## 2017-03-23 DIAGNOSIS — Z7902 Long term (current) use of antithrombotics/antiplatelets: Secondary | ICD-10-CM | POA: Diagnosis not present

## 2017-03-23 DIAGNOSIS — E781 Pure hyperglyceridemia: Secondary | ICD-10-CM | POA: Insufficient documentation

## 2017-03-23 DIAGNOSIS — Z79899 Other long term (current) drug therapy: Secondary | ICD-10-CM | POA: Insufficient documentation

## 2017-03-23 DIAGNOSIS — K219 Gastro-esophageal reflux disease without esophagitis: Secondary | ICD-10-CM | POA: Diagnosis not present

## 2017-03-23 DIAGNOSIS — D631 Anemia in chronic kidney disease: Secondary | ICD-10-CM | POA: Insufficient documentation

## 2017-03-23 DIAGNOSIS — Z8673 Personal history of transient ischemic attack (TIA), and cerebral infarction without residual deficits: Secondary | ICD-10-CM | POA: Diagnosis not present

## 2017-03-23 DIAGNOSIS — Z7982 Long term (current) use of aspirin: Secondary | ICD-10-CM | POA: Diagnosis not present

## 2017-03-23 DIAGNOSIS — Z87442 Personal history of urinary calculi: Secondary | ICD-10-CM | POA: Insufficient documentation

## 2017-03-23 DIAGNOSIS — I129 Hypertensive chronic kidney disease with stage 1 through stage 4 chronic kidney disease, or unspecified chronic kidney disease: Secondary | ICD-10-CM | POA: Diagnosis not present

## 2017-03-23 DIAGNOSIS — I493 Ventricular premature depolarization: Secondary | ICD-10-CM | POA: Diagnosis not present

## 2017-03-23 DIAGNOSIS — N189 Chronic kidney disease, unspecified: Secondary | ICD-10-CM | POA: Insufficient documentation

## 2017-03-23 DIAGNOSIS — Z87891 Personal history of nicotine dependence: Secondary | ICD-10-CM | POA: Insufficient documentation

## 2017-03-23 DIAGNOSIS — N200 Calculus of kidney: Secondary | ICD-10-CM | POA: Diagnosis not present

## 2017-03-23 DIAGNOSIS — I739 Peripheral vascular disease, unspecified: Secondary | ICD-10-CM | POA: Diagnosis not present

## 2017-03-23 HISTORY — PX: CYSTOSCOPY W/ RETROGRADES: SHX1426

## 2017-03-23 HISTORY — PX: CYSTOSCOPY/URETEROSCOPY/HOLMIUM LASER/STENT PLACEMENT: SHX6546

## 2017-03-23 LAB — GLUCOSE, CAPILLARY
GLUCOSE-CAPILLARY: 147 mg/dL — AB (ref 65–99)
Glucose-Capillary: 159 mg/dL — ABNORMAL HIGH (ref 65–99)

## 2017-03-23 SURGERY — CYSTOSCOPY/URETEROSCOPY/HOLMIUM LASER/STENT PLACEMENT
Anesthesia: General | Site: Ureter | Laterality: Left | Wound class: Clean Contaminated

## 2017-03-23 MED ORDER — MIDAZOLAM HCL 2 MG/2ML IJ SOLN
INTRAMUSCULAR | Status: AC
  Filled 2017-03-23: qty 2

## 2017-03-23 MED ORDER — FENTANYL CITRATE (PF) 100 MCG/2ML IJ SOLN
INTRAMUSCULAR | Status: DC | PRN
  Administered 2017-03-23: 25 ug via INTRAVENOUS
  Administered 2017-03-23: 50 ug via INTRAVENOUS
  Administered 2017-03-23: 25 ug via INTRAVENOUS
  Administered 2017-03-23: 50 ug via INTRAVENOUS

## 2017-03-23 MED ORDER — HYDROCODONE-ACETAMINOPHEN 5-325 MG PO TABS
1.0000 | ORAL_TABLET | ORAL | Status: DC | PRN
  Administered 2017-03-23: 1 via ORAL

## 2017-03-23 MED ORDER — MIDAZOLAM HCL 2 MG/2ML IJ SOLN
INTRAMUSCULAR | Status: DC | PRN
  Administered 2017-03-23: 2 mg via INTRAVENOUS

## 2017-03-23 MED ORDER — ONDANSETRON HCL 4 MG/2ML IJ SOLN: 4.0000 mg | Freq: Once | INTRAMUSCULAR | Status: DC | PRN

## 2017-03-23 MED ORDER — OXYBUTYNIN CHLORIDE 5 MG PO TABS
ORAL_TABLET | ORAL | Status: AC
Start: 2017-03-23 — End: 2017-03-23
  Administered 2017-03-23: 5 mg via ORAL
  Filled 2017-03-23: qty 1

## 2017-03-23 MED ORDER — LIDOCAINE HCL (PF) 2 % IJ SOLN
INTRAMUSCULAR | Status: AC
  Filled 2017-03-23: qty 10

## 2017-03-23 MED ORDER — ONDANSETRON HCL 4 MG/2ML IJ SOLN
INTRAMUSCULAR | Status: AC
  Filled 2017-03-23: qty 2

## 2017-03-23 MED ORDER — PROPOFOL 10 MG/ML IV BOLUS
INTRAVENOUS | Status: AC
  Filled 2017-03-23: qty 20

## 2017-03-23 MED ORDER — BELLADONNA ALKALOIDS-OPIUM 16.2-60 MG RE SUPP
RECTAL | Status: AC
  Administered 2017-03-23: 1 via RECTAL
  Filled 2017-03-23: qty 1

## 2017-03-23 MED ORDER — LIDOCAINE HCL (CARDIAC) 20 MG/ML IV SOLN
INTRAVENOUS | Status: DC | PRN
  Administered 2017-03-23: 100 mg via INTRAVENOUS

## 2017-03-23 MED ORDER — IOTHALAMATE MEGLUMINE 43 % IV SOLN
INTRAVENOUS | Status: DC | PRN
  Administered 2017-03-23: 15 mL via URETHRAL

## 2017-03-23 MED ORDER — HYDROCODONE-ACETAMINOPHEN 5-325 MG PO TABS: 1.0000 | ORAL_TABLET | ORAL | 0 refills | Status: DC | PRN

## 2017-03-23 MED ORDER — PHENYLEPHRINE HCL 10 MG/ML IJ SOLN
INTRAMUSCULAR | Status: AC
  Filled 2017-03-23: qty 1

## 2017-03-23 MED ORDER — GLYCOPYRROLATE 0.2 MG/ML IJ SOLN
INTRAMUSCULAR | Status: DC | PRN
  Administered 2017-03-23: 0.2 mg via INTRAVENOUS

## 2017-03-23 MED ORDER — ONDANSETRON HCL 4 MG/2ML IJ SOLN
INTRAMUSCULAR | Status: DC | PRN
  Administered 2017-03-23: 4 mg via INTRAVENOUS

## 2017-03-23 MED ORDER — CEFAZOLIN SODIUM-DEXTROSE 2-4 GM/100ML-% IV SOLN
INTRAVENOUS | Status: AC
  Filled 2017-03-23: qty 100

## 2017-03-23 MED ORDER — OXYBUTYNIN CHLORIDE 5 MG PO TABS: ORAL_TABLET | ORAL | 0 refills | Status: DC

## 2017-03-23 MED ORDER — PHENYLEPHRINE HCL 10 MG/ML IJ SOLN
INTRAMUSCULAR | Status: DC | PRN
  Administered 2017-03-23: 100 ug via INTRAVENOUS

## 2017-03-23 MED ORDER — FENTANYL CITRATE (PF) 100 MCG/2ML IJ SOLN
25.0000 ug | INTRAMUSCULAR | Status: DC | PRN
  Administered 2017-03-23: 25 ug via INTRAVENOUS

## 2017-03-23 MED ORDER — SODIUM CHLORIDE 0.9 % IV SOLN
INTRAVENOUS | Status: DC
  Administered 2017-03-23: 07:00:00 via INTRAVENOUS

## 2017-03-23 MED ORDER — FENTANYL CITRATE (PF) 100 MCG/2ML IJ SOLN
INTRAMUSCULAR | Status: AC
  Filled 2017-03-23: qty 2

## 2017-03-23 MED ORDER — BELLADONNA ALKALOIDS-OPIUM 16.2-60 MG RE SUPP
1.0000 | Freq: Once | RECTAL | Status: AC
  Administered 2017-03-23: 1 via RECTAL
  Filled 2017-03-23: qty 1

## 2017-03-23 MED ORDER — OXYBUTYNIN CHLORIDE 5 MG PO TABS
5.0000 mg | ORAL_TABLET | Freq: Three times a day (TID) | ORAL | Status: DC | PRN
  Administered 2017-03-23: 5 mg via ORAL
  Filled 2017-03-23: qty 1

## 2017-03-23 MED ORDER — PROPOFOL 10 MG/ML IV BOLUS
INTRAVENOUS | Status: DC | PRN
  Administered 2017-03-23: 200 mg via INTRAVENOUS

## 2017-03-23 MED ORDER — HYDROCODONE-ACETAMINOPHEN 5-325 MG PO TABS
ORAL_TABLET | ORAL | Status: AC
  Administered 2017-03-23: 1 via ORAL
  Filled 2017-03-23: qty 1

## 2017-03-23 SURGICAL SUPPLY — 31 items
BAG DRAIN CYSTO-URO LG1000N (MISCELLANEOUS) ×4 IMPLANT
BASKET ZERO TIP 1.9FR (BASKET) ×4 IMPLANT
BULB IRRIG PATHFIND (MISCELLANEOUS) ×4 IMPLANT
CATH URETL 5X70 OPEN END (CATHETERS) ×4 IMPLANT
CNTNR SPEC 2.5X3XGRAD LEK (MISCELLANEOUS)
CONRAY 43 FOR UROLOGY 50M (MISCELLANEOUS) ×4 IMPLANT
CONT SPEC 4OZ STER OR WHT (MISCELLANEOUS)
CONTAINER SPEC 2.5X3XGRAD LEK (MISCELLANEOUS) IMPLANT
DRAPE UTILITY 15X26 TOWEL STRL (DRAPES) ×4 IMPLANT
FIBER LASER LITHO 273 (Laser) ×4 IMPLANT
GLOVE BIOGEL M 8.0 STRL (GLOVE) ×4 IMPLANT
GOWN STANDARD XL  REUSABL (MISCELLANEOUS) ×4 IMPLANT
GUIDEWIRE GREEN .038 145CM (MISCELLANEOUS) IMPLANT
INFUSOR MANOMETER BAG 3000ML (MISCELLANEOUS) ×4 IMPLANT
INTRODUCER DILATOR DOUBLE (INTRODUCER) ×4 IMPLANT
KIT RM TURNOVER CYSTO AR (KITS) ×4 IMPLANT
PACK CYSTO AR (MISCELLANEOUS) ×4 IMPLANT
SENSORWIRE 0.038 NOT ANGLED (WIRE) ×4
SET CYSTO W/LG BORE CLAMP LF (SET/KITS/TRAYS/PACK) ×4 IMPLANT
SHEATH URETERAL 12FRX35CM (MISCELLANEOUS) ×4 IMPLANT
SHEATH URETERAL 13/15X36 1L (SHEATH) IMPLANT
SHEATH URETL 1L 13/15X28 (SHEATH) IMPLANT
SOL .9 NS 3000ML IRR  AL (IV SOLUTION) ×2
SOL .9 NS 3000ML IRR UROMATIC (IV SOLUTION) ×2 IMPLANT
STENT URET 6FRX24 CONTOUR (STENTS) ×4 IMPLANT
STENT URET 6FRX26 CONTOUR (STENTS) ×4 IMPLANT
SURGILUBE 2OZ TUBE FLIPTOP (MISCELLANEOUS) ×4 IMPLANT
SYR 10ML LL (SYRINGE) ×4 IMPLANT
WATER STERILE IRR 1000ML POUR (IV SOLUTION) ×4 IMPLANT
WATER STERILE IRR 3000ML UROMA (IV SOLUTION) ×4 IMPLANT
WIRE SENSOR 0.038 NOT ANGLED (WIRE) ×2 IMPLANT

## 2017-03-23 NOTE — OR Nursing (Signed)
Dr. Bernardo Heater into see pt.

## 2017-03-23 NOTE — Anesthesia Procedure Notes (Signed)
Procedure Name: LMA Insertion Performed by: Ifeanyichukwu Wickham, CRNA Pre-anesthesia Checklist: Patient identified, Patient being monitored, Timeout performed, Emergency Drugs available and Suction available Patient Re-evaluated:Patient Re-evaluated prior to induction Oxygen Delivery Method: Circle system utilized Preoxygenation: Pre-oxygenation with 100% oxygen Induction Type: IV induction LMA: LMA inserted LMA Size: 4.5 Tube type: Oral Number of attempts: 1 Placement Confirmation: positive ETCO2 and breath sounds checked- equal and bilateral Tube secured with: Tape Dental Injury: Teeth and Oropharynx as per pre-operative assessment        

## 2017-03-23 NOTE — OR Nursing (Signed)
At 11:45 bladder scanner indicated 28 ml of urine in the bladder.

## 2017-03-23 NOTE — Anesthesia Postprocedure Evaluation (Signed)
Anesthesia Post Note  Patient: [name] [name]  Procedure(s) Performed: CYSTOSCOPY/URETEROSCOPY/HOLMIUM LASER/STENT PLACEMENT (Left Ureter) CYSTOSCOPY WITH RETROGRADE PYELOGRAM (Bilateral Ureter)  Patient location during evaluation: PACU Anesthesia Type: General Level of consciousness: awake and alert Pain management: pain level controlled Vital Signs Assessment: post-procedure vital signs reviewed and stable Respiratory status: spontaneous breathing, nonlabored ventilation, respiratory function stable and patient connected to nasal cannula oxygen Cardiovascular status: blood pressure returned to baseline and stable Postop Assessment: no apparent nausea or vomiting Anesthetic complications: no     Last Vitals:  Vitals:   03/23/17 0953 03/23/17 1018  BP: (!) 153/78 (!) 149/88  Pulse: 90   Resp: 20 (!) 198  Temp: 36.6 C 36.5 C  SpO2: 94% 94%    Last Pain:  Vitals:   03/23/17 1018  TempSrc:   PainSc: [place]

## 2017-03-23 NOTE — OR Nursing (Signed)
Spoke with Dr. Bernardo Heater regarding pt's increased in pain. Bladder scan was ordered to see if pt is emptying his bladder and a B+O suppository can be given afterwards.

## 2017-03-23 NOTE — Op Note (Signed)
Date of procedure: 03/23/17  Preoperative diagnosis:  1. Left nephrolithiasis 2. History right distal ureteral calculus  Postoperative diagnosis:  1. Left nephrolithiasis  Procedure: 1. Cystoscopy with bilateral retrograde pyelograms 2. Left ureteroscopy with laser lithotripsy/calculus removal-staged 3. Placement of left ureteral stent  Surgeon: John Giovanni, MD  Anesthesia: General  Complications: None  Intraoperative findings:          1.  Right retrograde pyelogram-no filling defects, hydroureter or hydronephrosis.  Previously noted right distal ureteral calculus no longer present         2.  Left retrograde pyelogram-post procedure no contrast extravasation upper pole renal calculi noted         3.  Ureteroscopy multiple nonobstructing left renal calculi up to 18 stones present  EBL: Minimal  Specimens: Stones for analysis  Drains: 6 French/26 cm double-J ureteral stent  Indication: ARWIN BISCEGLIA is a 56 y.o. patient with a recent episode of right renal colic secondary to a 4 mm right UVJ calculus.  He had resolution of his pain consistent with a passed stone although he was unable to retrieve.  He has a history of uric acid nephrolithiasis.  CT showed multiple nonobstructing left renal calculi and he requested ureteroscopic removal.  After reviewing the management options for treatment, he elected to proceed with the above surgical procedure(s). We have discussed the potential benefits and risks of the procedure, side effects of the proposed treatment, the likelihood of the patient achieving the goals of the procedure, and any potential problems that might occur during the procedure or recuperation. Informed consent has been obtained.  Description of procedure:  The patient was taken to the operating room and general anesthesia was induced.  The patient was placed in the dorsal lithotomy position, prepped and draped in the usual sterile fashion, and preoperative  antibiotics were administered. A preoperative time-out was performed.   A 21 French cystoscope was lubricated and passed under direct vision.  The urethra was normal caliber without stricture.  Prostate shows mild lateral lobe enlargement with mild bladder neck elevation.  Panendoscopy was performed and the bladder mucosa was normal in appearance without erythema, solid or papillary lesions.  The ureteral orifices were normal appearing bilaterally with clear efflux.  A 5 French open-ended ureteral catheter was placed through the cystoscope and into the right ureteral orifice.  Right retrograde pyelogram performed with findings as described above.  Attention was then directed to the left ureteral orifice.  A 0.038 Sensor wire was placed through the cystoscope and into the left orifice and advanced up to the renal pelvis with position verified by fluoroscopy.  The cystoscope was removed and a dual lumen catheter was placed over the wire and a second sensor wire was placed.  The dual-lumen catheter was removed.  A 12 French/36 cm ureteral access sheath was then placed over the working wire without difficulty.  A flexible ureteroscope was then placed through the access sheath and advanced into the renal pelvis.  All calyces were examined.  Within lower pole calyces were multiple round calculi.  A 1.9 French nitinol basket was placed through the ureteroscope and 10 of the calculi were able to be extracted.  The larger calculi were deposited in an upper pole calyx.  The larger calculi size estimated from 7-10 mm and 8 of these were deposited in upper pole calyx.  A 273 m holmium laser fiber was then placed to the ureteroscope and the calculi were fragmented at settings of 0.2 J /  30 Hz.  The fragments were extracted using the nitinol basket however due to oozing visualization was obscured.  Approximately 75% of the stone burden was removed and due to inadequate visualization it was elected to place a ureteral  stent and return for a second look procedure at a later date.  The ureteroscope and access sheath were withdrawn in tandem and the right proximal, mid and distal ureter showed no evidence of injury.  A 6 French/26 cm double-pigtail ureteral stent was placed under fluoroscopic guidance.  There was good curl noted in the renal pelvis and bladder under fluoroscopy.  The cystoscope was replaced and the bladder was emptied.  The distal and a stent was well-positioned.  After anesthetic reversal the patient was transported to PACU in stable condition.   John Giovanni, M.D.

## 2017-03-23 NOTE — Interval H&P Note (Signed)
History and Physical Interval Note:  03/23/2017 7:19 AM  [name] [name]  has presented today for surgery, with the diagnosis of Left renal stones  The various methods of treatment have been discussed with the patient and family. After consideration of risks, benefits and other options for treatment, the patient has consented to  Procedure(s): CYSTOSCOPY/URETEROSCOPY/HOLMIUM LASER/STENT PLACEMENT (Left) CYSTOSCOPY WITH RETROGRADE PYELOGRAM (Bilateral) as a surgical intervention .  The patient's history has been reviewed, patient examined, no change in status, stable for surgery.  I have reviewed the patient's chart and labs.  Questions were answered to the patient's satisfaction.    CV:RR Lungs: clear  The indications and nature of the planned procedure were discussed as well as the potential  benefits and expected outcome.  Alternatives have been discussed in detail. The most common complications and side effects were discussed including but not limited to infection/sepsis; blood loss; damage to urethra, bladder, ureter, kidney; need for multiple surgeries; need for prolonged stent placement. Although uncommon he was also informed of the possibility that the calculus may not be able to be treated due to inability to obtain access to the upper ureter. In that event he would require stent placement and a follow-up procedure after a period of stent dilation. All of his questions were answered and he desires to proceed.    Cesar Chavez

## 2017-03-23 NOTE — Anesthesia Post-op Follow-up Note (Signed)
Anesthesia QCDR form completed.        

## 2017-03-23 NOTE — Transfer of Care (Signed)
Immediate Anesthesia Transfer of Care Note  Patient: [name] [name]  Procedure(s) Performed: CYSTOSCOPY/URETEROSCOPY/HOLMIUM LASER/STENT PLACEMENT (Left Ureter) CYSTOSCOPY WITH RETROGRADE PYELOGRAM (Bilateral Ureter)  Patient Location: PACU  Anesthesia Type:General  Level of Consciousness: sedated  Airway & Oxygen Therapy: Patient Spontanous Breathing and Patient connected to face mask oxygen  Post-op Assessment: Report given to RN and Post -op Vital signs reviewed and stable  Post vital signs: Reviewed and stable  Last Vitals:  Vitals:   03/23/17 0616 03/23/17 0923  BP: (!) 164/94 (!) 153/86  Pulse: 82 91  Resp: 16 16  Temp: 36.4 C   SpO2: 97% 96%    Last Pain:  Vitals:   03/23/17 0616  TempSrc: Tympanic         Complications: No apparent anesthesia complications

## 2017-03-23 NOTE — Anesthesia Preprocedure Evaluation (Addendum)
Anesthesia Evaluation  Patient identified by MRN, date of birth, ID band Patient awake    Reviewed: Allergy & Precautions, NPO status , Patient's Chart, lab work & pertinent test results, reviewed documented beta blocker date and time   Airway Mallampati: III  TM Distance: >3 FB     Dental  (+) Chipped, Missing   Pulmonary former smoker,           Cardiovascular hypertension, Pt. on medications and Pt. on home beta blockers + Peripheral Vascular Disease       Neuro/Psych    GI/Hepatic GERD  Controlled,(+) Hepatitis -  Endo/Other    Renal/GU Renal disease     Musculoskeletal   Abdominal   Peds  Hematology  (+) anemia ,   Anesthesia Other Findings Has multiple PVCs. Has had a TIA in the past.  Reproductive/Obstetrics                            Anesthesia Physical Anesthesia Plan  ASA: III  Anesthesia Plan: General   Post-op Pain Management:    Induction: Intravenous  PONV Risk Score and Plan:   Airway Management Planned: LMA  Additional Equipment:   Intra-op Plan:   Post-operative Plan:   Informed Consent: I have reviewed the patients History and Physical, chart, labs and discussed the procedure including the risks, benefits and alternatives for the proposed anesthesia with the patient or authorized representative who has indicated his/her understanding and acceptance.     Plan Discussed with: CRNA  Anesthesia Plan Comments:         Anesthesia Quick Evaluation

## 2017-03-23 NOTE — Discharge Instructions (Signed)
AMBULATORY SURGERY  DISCHARGE INSTRUCTIONS   1) The drugs that you were given will stay in your system until tomorrow so for the next 24 hours you should not:  A) Drive an automobile B) Make any legal decisions C) Drink any alcoholic beverage   2) You may resume regular meals tomorrow.  Today it is better to start with liquids and gradually work up to solid foods.  You may eat anything you prefer, but it is better to start with liquids, then soup and crackers, and gradually work up to solid foods.   3) Please notify your doctor immediately if you have any unusual bleeding, trouble breathing, redness and pain at the surgery site, drainage, fever, or pain not relieved by medication.   4) Additional Instructions:Drink plenty of water.  It is normal to feel urgency to void and to have blood in your urine for the 1st day or 2, the blood should gradually clear.

## 2017-03-24 ENCOUNTER — Other Ambulatory Visit: Payer: Self-pay | Admitting: Radiology

## 2017-03-24 DIAGNOSIS — N200 Calculus of kidney: Secondary | ICD-10-CM

## 2017-03-25 ENCOUNTER — Other Ambulatory Visit: Payer: 59

## 2017-03-25 DIAGNOSIS — N200 Calculus of kidney: Secondary | ICD-10-CM

## 2017-03-26 NOTE — Patient Instructions (Signed)
Your procedure is scheduled on: Wednesday, March 31, 2017 Report to Same Day Surgery on the 2nd floor in the [facility]. To find out your arrival time, please call [phone] between 1PM - 3PM on: Monday, March 29, 2017  REMEMBER: Instructions that are not followed completely may result in serious medical risk, up to and including death; or upon the discretion of your surgeon and anesthesiologist your surgery may need to be rescheduled.  Do not eat food after midnight the night before your procedure.  No gum chewing or hard candies.  You may however, drink CLEAR liquids up to 2 hours before you are scheduled to arrive at the hospital for your procedure.  Do not drink clear liquids within 2 hours of the start of your surgery.  Clear liquids include: - water  - apple juice without pulp - clear gatorade - black coffee or tea (Do NOT add anything to the coffee or tea) Do NOT drink anything that is not on this list.  No Alcohol for 24 hours before or after surgery.  No Smoking including e-cigarettes for 24 hours prior to surgery. No chewable tobacco products for at least 6 hours prior to surgery. No nicotine patches on the day of surgery.  Notify your doctor if there is any change in your medical condition (cold, fever, infection).  Do not wear jewelry, make-up, hairpins, clips or nail polish.  Do not wear lotions, powders, or perfumes. You may wear deodorant.  Do not shave 48 hours prior to surgery. Men may shave face and neck.  Contacts and dentures may not be worn into surgery.  Do not bring valuables to the hospital. [facility] is not responsible for any belongings or valuables.   TAKE THESE MEDICATIONS THE MORNING OF SURGERY WITH A SIP OF WATER:  1.  DILTIAZEM 2.  METOPROLOL 3.  PANTOPRAZOLE  Follow recommendations from Cardiologist or PCP regarding stopping Aspirin and Plavix.  Stop Anti-inflammatories such as Advil, Aleve, Ibuprofen, Motrin, Naproxen,  Naprosyn, Goodie powder, or aspirin products. (May take Tylenol or Acetaminophen if needed.)  Stop ANY OVER THE COUNTER supplements until after surgery. (May continue Vitamin D, Vitamin B, and multivitamin.)  If you are being discharged the day of surgery, you will not be allowed to drive home. You will need someone to drive you home and stay with you that night.   If you are taking public transportation, you will need to have a responsible adult to with you.  Please call the number above if you have any questions about these instructions.

## 2017-03-27 LAB — URINE CULTURE: ORGANISM ID, BACTERIA: NO GROWTH

## 2017-03-30 MED ORDER — CEFAZOLIN SODIUM-DEXTROSE 2-4 GM/100ML-% IV SOLN
2.0000 g | INTRAVENOUS | Status: AC
  Administered 2017-03-31: 2 g via INTRAVENOUS

## 2017-03-31 ENCOUNTER — Ambulatory Visit
Admission: RE | Admit: 2017-03-31 | Discharge: 2017-03-31 | Disposition: A | Discharge status: Home / Self Care | Payer: 59 | Source: Ambulatory Visit | Attending: Urology | Admitting: Urology

## 2017-03-31 ENCOUNTER — Ambulatory Visit: Payer: 59 | Admitting: Anesthesiology

## 2017-03-31 ENCOUNTER — Encounter: Payer: Self-pay | Admitting: Anesthesiology

## 2017-03-31 ENCOUNTER — Encounter
Admission: RE | Disposition: A | Discharge status: Home / Self Care | Payer: Self-pay | Source: Ambulatory Visit | Attending: Urology

## 2017-03-31 DIAGNOSIS — E781 Pure hyperglyceridemia: Secondary | ICD-10-CM | POA: Insufficient documentation

## 2017-03-31 DIAGNOSIS — I739 Peripheral vascular disease, unspecified: Secondary | ICD-10-CM | POA: Insufficient documentation

## 2017-03-31 DIAGNOSIS — Z87442 Personal history of urinary calculi: Secondary | ICD-10-CM | POA: Diagnosis not present

## 2017-03-31 DIAGNOSIS — N189 Chronic kidney disease, unspecified: Secondary | ICD-10-CM | POA: Insufficient documentation

## 2017-03-31 DIAGNOSIS — Z87891 Personal history of nicotine dependence: Secondary | ICD-10-CM | POA: Insufficient documentation

## 2017-03-31 DIAGNOSIS — N200 Calculus of kidney: Secondary | ICD-10-CM

## 2017-03-31 DIAGNOSIS — Z79899 Other long term (current) drug therapy: Secondary | ICD-10-CM | POA: Insufficient documentation

## 2017-03-31 DIAGNOSIS — I129 Hypertensive chronic kidney disease with stage 1 through stage 4 chronic kidney disease, or unspecified chronic kidney disease: Secondary | ICD-10-CM | POA: Insufficient documentation

## 2017-03-31 DIAGNOSIS — Z7982 Long term (current) use of aspirin: Secondary | ICD-10-CM | POA: Insufficient documentation

## 2017-03-31 HISTORY — PX: CYSTOSCOPY W/ URETERAL STENT PLACEMENT: SHX1429

## 2017-03-31 HISTORY — PX: CYSTOSCOPY/RETROGRADE/URETEROSCOPY/STONE EXTRACTION WITH BASKET: SHX5317

## 2017-03-31 SURGERY — CYSTOSCOPY, WITH CALCULUS REMOVAL USING BASKET
Anesthesia: General | Site: Ureter | Laterality: Left | Wound class: Clean Contaminated

## 2017-03-31 MED ORDER — GLYCOPYRROLATE 0.2 MG/ML IJ SOLN
INTRAMUSCULAR | Status: DC | PRN
  Administered 2017-03-31: 0.2 mg via INTRAVENOUS

## 2017-03-31 MED ORDER — OXYBUTYNIN CHLORIDE 5 MG PO TABS: ORAL_TABLET | ORAL | 0 refills | Status: DC

## 2017-03-31 MED ORDER — DEXAMETHASONE SODIUM PHOSPHATE 10 MG/ML IJ SOLN
INTRAMUSCULAR | Status: AC
  Filled 2017-03-31: qty 1

## 2017-03-31 MED ORDER — ONDANSETRON HCL 4 MG/2ML IJ SOLN: 4.0000 mg | Freq: Once | INTRAMUSCULAR | Status: DC | PRN

## 2017-03-31 MED ORDER — GLYCOPYRROLATE 0.2 MG/ML IJ SOLN
INTRAMUSCULAR | Status: AC
  Filled 2017-03-31: qty 1

## 2017-03-31 MED ORDER — LIDOCAINE HCL (CARDIAC) 20 MG/ML IV SOLN
INTRAVENOUS | Status: DC | PRN
  Administered 2017-03-31: 100 mg via INTRAVENOUS

## 2017-03-31 MED ORDER — MIDAZOLAM HCL 2 MG/2ML IJ SOLN
INTRAMUSCULAR | Status: AC
  Filled 2017-03-31: qty 2

## 2017-03-31 MED ORDER — ONDANSETRON HCL 4 MG/2ML IJ SOLN
INTRAMUSCULAR | Status: AC
  Filled 2017-03-31: qty 2

## 2017-03-31 MED ORDER — PROPOFOL 10 MG/ML IV BOLUS
INTRAVENOUS | Status: AC
  Filled 2017-03-31: qty 20

## 2017-03-31 MED ORDER — FENTANYL CITRATE (PF) 100 MCG/2ML IJ SOLN
INTRAMUSCULAR | Status: DC | PRN
  Administered 2017-03-31: 25 ug via INTRAVENOUS
  Administered 2017-03-31 (×2): 50 ug via INTRAVENOUS

## 2017-03-31 MED ORDER — FENTANYL CITRATE (PF) 100 MCG/2ML IJ SOLN: 25.0000 ug | INTRAMUSCULAR | Status: DC | PRN

## 2017-03-31 MED ORDER — DEXAMETHASONE SODIUM PHOSPHATE 10 MG/ML IJ SOLN
INTRAMUSCULAR | Status: DC | PRN
  Administered 2017-03-31: 10 mg via INTRAVENOUS

## 2017-03-31 MED ORDER — IOTHALAMATE MEGLUMINE 43 % IV SOLN
INTRAVENOUS | Status: DC | PRN
  Administered 2017-03-31: 15 mL via URETHRAL

## 2017-03-31 MED ORDER — CEFAZOLIN SODIUM-DEXTROSE 2-4 GM/100ML-% IV SOLN
INTRAVENOUS | Status: AC
  Filled 2017-03-31: qty 100

## 2017-03-31 MED ORDER — MIDAZOLAM HCL 2 MG/2ML IJ SOLN
INTRAMUSCULAR | Status: DC | PRN
  Administered 2017-03-31: 2 mg via INTRAVENOUS

## 2017-03-31 MED ORDER — SODIUM CHLORIDE 0.9 % IV SOLN
INTRAVENOUS | Status: DC
  Administered 2017-03-31: 13:00:00 via INTRAVENOUS

## 2017-03-31 MED ORDER — FENTANYL CITRATE (PF) 100 MCG/2ML IJ SOLN
INTRAMUSCULAR | Status: AC
  Filled 2017-03-31: qty 2

## 2017-03-31 MED ORDER — HYDROCODONE-ACETAMINOPHEN 5-325 MG PO TABS: 1.0000 | ORAL_TABLET | ORAL | 0 refills | Status: DC | PRN

## 2017-03-31 MED ORDER — PROPOFOL 10 MG/ML IV BOLUS
INTRAVENOUS | Status: DC | PRN
  Administered 2017-03-31: 140 mg via INTRAVENOUS
  Administered 2017-03-31 (×2): 30 mg via INTRAVENOUS

## 2017-03-31 SURGICAL SUPPLY — 27 items
BAG DRAIN CYSTO-URO LG1000N (MISCELLANEOUS) ×4 IMPLANT
BASKET ZERO TIP 1.9FR (BASKET) ×4 IMPLANT
CATH URETL 5X70 OPEN END (CATHETERS) ×4 IMPLANT
CNTNR SPEC 2.5X3XGRAD LEK (MISCELLANEOUS)
CONRAY 43 FOR UROLOGY 50M (MISCELLANEOUS) ×4 IMPLANT
CONT SPEC 4OZ STER OR WHT (MISCELLANEOUS)
CONTAINER SPEC 2.5X3XGRAD LEK (MISCELLANEOUS) IMPLANT
DRAPE UTILITY 15X26 TOWEL STRL (DRAPES) ×4 IMPLANT
FIBER LASER LITHO 273 (Laser) IMPLANT
GLOVE BIOGEL M 8.0 STRL (GLOVE) ×4 IMPLANT
GOWN STANDARD XL  REUSABL (MISCELLANEOUS) ×4 IMPLANT
GUIDEWIRE GREEN .038 145CM (MISCELLANEOUS) IMPLANT
INFUSOR MANOMETER BAG 3000ML (MISCELLANEOUS) ×4 IMPLANT
INTRODUCER DILATOR DOUBLE (INTRODUCER) IMPLANT
KIT RM TURNOVER CYSTO AR (KITS) ×4 IMPLANT
PACK CYSTO AR (MISCELLANEOUS) ×4 IMPLANT
SENSORWIRE 0.038 NOT ANGLED (WIRE) ×4
SET CYSTO W/LG BORE CLAMP LF (SET/KITS/TRAYS/PACK) ×4 IMPLANT
SHEATH URETERAL 12FRX35CM (MISCELLANEOUS) IMPLANT
SHEATH URETERAL 13/15X36 1L (SHEATH) ×4 IMPLANT
SHEATH URETL 1L 13/15X28 (SHEATH) IMPLANT
SOL .9 NS 3000ML IRR  AL (IV SOLUTION) ×2
SOL .9 NS 3000ML IRR UROMATIC (IV SOLUTION) ×2 IMPLANT
STENT URET 6FRX26 CONTOUR (STENTS) ×4 IMPLANT
SURGILUBE 2OZ TUBE FLIPTOP (MISCELLANEOUS) ×4 IMPLANT
WATER STERILE IRR 1000ML POUR (IV SOLUTION) ×4 IMPLANT
WIRE SENSOR 0.038 NOT ANGLED (WIRE) ×2 IMPLANT

## 2017-03-31 NOTE — Transfer of Care (Signed)
Immediate Anesthesia Transfer of Care Note  Patient: [name] [name]  Procedure(s) Performed: CYSTOSCOPY/RETROGRADE/URETEROSCOPY/STONE EXTRACTION WITH BASKET (Left Ureter) CYSTOSCOPY WITH STENT REPLACEMENT (Left Ureter)  Patient Location: PACU  Anesthesia Type:General  Level of Consciousness: awake, alert  and patient cooperative  Airway & Oxygen Therapy: Patient Spontanous Breathing and Patient connected to nasal cannula oxygen  Post-op Assessment: Report given to RN and Post -op Vital signs reviewed and stable  Post vital signs: Reviewed and stable  Last Vitals:  Vitals:   03/31/17 1203 03/31/17 1403  BP: (!) 168/80   Pulse: 81 (P) 94  Resp: 18   Temp: 36.9 C (P) 36.6 C  SpO2: 99%     Last Pain:  Vitals:   03/31/17 1203  TempSrc: Oral         Complications: No apparent anesthesia complications

## 2017-03-31 NOTE — Discharge Instructions (Signed)

## 2017-03-31 NOTE — Op Note (Signed)
Date of procedure: 03/31/17  Preoperative diagnosis:  1. Left nephrolithiasis  Postoperative diagnosis:  1. Left nephrolithiasis  Procedure: 1. Cystoscopy with removal of left ureteral stent 2. Left ureteroscopy with basket extraction left renal calculi 3. Left retrograde pyelogram 4. Placement left ureteral stent  Surgeon: John Giovanni, MD  Anesthesia: General  Complications: None  Intraoperative findings: Multiple small remaining left renal calculus fragments Retrograde pyelogram: No contrast extravasation, hydronephrosis or filling defects  EBL: Minimal  Specimens: None-stone fragments sent for analysis last week   Drains: 6 French/26 cm double-J ureteral stent  Indication: [name] [name] is a 56 y.o. patient with significant left nephrolithiasis.  He underwent left ureteroscopy with laser lithotripsy for multiple renal calculi on 12/18.  It was felt there was adequate stone fragmentation however vision became suboptimal secondary to oozing and it was elected to have him return for a staged procedure.  After reviewing the management options for treatment, he elected to proceed with the above surgical procedure(s). We have discussed the potential benefits and risks of the procedure, side effects of the proposed treatment, the likelihood of the patient achieving the goals of the procedure, and any potential problems that might occur during the procedure or recuperation. Informed consent has been obtained.  Description of procedure:  The patient was taken to the operating room and general anesthesia was induced.  The patient was placed in the dorsal lithotomy position, prepped and draped in the usual sterile fashion, and preoperative antibiotics were administered. A preoperative time-out was performed.   A 21 French cystoscope was lubricated and passed per urethra.  The urethra was normal in caliber without stricture.  The prostate was remarkable for mild lateral lobe  enlargement.  The distal stent and was visualized at the bladder neck, grasped with endoscopic forceps and brought out to the urethral meatus.  A 0.038 Sensor wire was placed through the stent and advanced to the left renal pelvis verified by fluoroscopy.  The ureteral stent was removed.  A 12 French/35 cm ureteral access sheath was placed over the wire without difficulty.  The inner stylette was removed.  A digital flexible ureteroscope was placed through the sheath and advanced into the left renal pelvis.  Several fragments were identified within a midpole calyx which were removed with a 1.9 [place] nitinol basket.  Additional fragments were identified in the lower pole calyx and removed with the basket without difficulty.  Approximately 10-12 fragments were removed.  Retrograde pyelogram was then performed and no extravasation was noted.  No filling defects were noted.  Each calyx was examined and no significant sized stone fragments were identified.  The ureteroscope was removed and the ureteroscope and access sheath were withdrawn in tandem and the ureter showed no abnormalities.  A 6 French/26 cm double-J ureteral stent with string was placed under fluoroscopic guidance.  There was good positioning noted proximally and distally under fluoroscopy.  After anesthetic reversal he was taken to PACU in stable condition.   Plan: He will remove his stent in 48 hours.  Follow-up 4-6 weeks with a KUB and renal ultrasound.    John Giovanni, M.D.

## 2017-03-31 NOTE — Anesthesia Preprocedure Evaluation (Signed)
Anesthesia Evaluation  Patient identified by MRN, date of birth, ID band Patient awake    Reviewed: Allergy & Precautions, NPO status , Patient's Chart, lab work & pertinent test results, reviewed documented beta blocker date and time   Airway Mallampati: III  TM Distance: >3 FB     Dental  (+) Chipped   Pulmonary former smoker,           Cardiovascular hypertension, Pt. on medications and Pt. on home beta blockers + Peripheral Vascular Disease       Neuro/Psych    GI/Hepatic GERD  Controlled,(+) Hepatitis -  Endo/Other    Renal/GU Renal disease     Musculoskeletal   Abdominal   Peds  Hematology  (+) anemia ,   Anesthesia Other Findings   Reproductive/Obstetrics                             Anesthesia Physical Anesthesia Plan  ASA: III  Anesthesia Plan: General   Post-op Pain Management:    Induction: Intravenous  PONV Risk Score and Plan:   Airway Management Planned: LMA  Additional Equipment:   Intra-op Plan:   Post-operative Plan:   Informed Consent: I have reviewed the patients History and Physical, chart, labs and discussed the procedure including the risks, benefits and alternatives for the proposed anesthesia with the patient or authorized representative who has indicated his/her understanding and acceptance.     Plan Discussed with: CRNA  Anesthesia Plan Comments:         Anesthesia Quick Evaluation

## 2017-03-31 NOTE — Anesthesia Postprocedure Evaluation (Signed)
Anesthesia Post Note  Patient: [name] [name]  Procedure(s) Performed: CYSTOSCOPY/RETROGRADE/URETEROSCOPY/STONE EXTRACTION WITH BASKET (Left Ureter) CYSTOSCOPY WITH STENT REPLACEMENT (Left Ureter)  Patient location during evaluation: PACU Anesthesia Type: General Level of consciousness: awake and alert Pain management: pain level controlled Vital Signs Assessment: post-procedure vital signs reviewed and stable Respiratory status: spontaneous breathing, nonlabored ventilation, respiratory function stable and patient connected to nasal cannula oxygen Cardiovascular status: blood pressure returned to baseline and stable Postop Assessment: no apparent nausea or vomiting Anesthetic complications: no     Last Vitals:  Vitals:   03/31/17 1433 03/31/17 1445  BP: 137/77 127/80  Pulse: 92 81  Resp: 12 14  Temp: 36.8 C 36.6 C  SpO2: 97% 95%    Last Pain:  Vitals:   03/31/17 1403  TempSrc:   PainSc: [name] S

## 2017-03-31 NOTE — Anesthesia Post-op Follow-up Note (Signed)
Anesthesia QCDR form completed.        

## 2017-03-31 NOTE — Interval H&P Note (Signed)
History and Physical Interval Note:  03/31/2017 12:54 PM  [name] [name]  has presented today for surgery, with the diagnosis of left nephrolithiasis  The various methods of treatment have been discussed with the patient and family. After consideration of risks, benefits and other options for treatment, the patient has consented to  Procedure(s): CYSTOSCOPY/URETEROSCOPY/HOLMIUM LASER/STENT exchange (Left) as a surgical intervention .  The patient's history has been reviewed, patient examined, no change in status, stable for surgery.  I have reviewed the patient's chart and labs.  Questions were answered to the patient's satisfaction.     [place]

## 2017-03-31 NOTE — Anesthesia Procedure Notes (Signed)
Procedure Name: LMA Insertion Date/Time: 03/31/2017 1:04 PM Performed by: Eben Burow, CRNA Pre-anesthesia Checklist: Patient identified, Emergency Drugs available, Suction available, Patient being monitored and Timeout performed Patient Re-evaluated:Patient Re-evaluated prior to induction Oxygen Delivery Method: Circle system utilized Preoxygenation: Pre-oxygenation with 100% oxygen Induction Type: IV induction LMA: LMA inserted LMA Size: 5.0 Number of attempts: 1 Placement Confirmation: positive ETCO2 and breath sounds checked- equal and bilateral Tube secured with: Tape Dental Injury: Teeth and Oropharynx as per pre-operative assessment

## 2017-04-01 ENCOUNTER — Encounter: Payer: Self-pay | Admitting: Urology

## 2017-04-01 ENCOUNTER — Other Ambulatory Visit: Payer: 59

## 2017-04-02 LAB — STONE ANALYSIS
STONE WEIGHT KSTONE: 137.7 mg
Uric Acid: 100 %

## 2017-04-06 DIAGNOSIS — I739 Peripheral vascular disease, unspecified: Secondary | ICD-10-CM

## 2017-04-06 DIAGNOSIS — E041 Nontoxic single thyroid nodule: Secondary | ICD-10-CM

## 2017-04-06 HISTORY — DX: Peripheral vascular disease, unspecified: I73.9

## 2017-04-06 HISTORY — DX: Nontoxic single thyroid nodule: E04.1

## 2017-04-13 ENCOUNTER — Ambulatory Visit
Admission: RE | Admit: 2017-04-13 | Discharge: 2017-04-13 | Disposition: A | Discharge status: Home / Self Care | Payer: Managed Care, Other (non HMO) | Source: Ambulatory Visit | Attending: Urology | Admitting: Urology

## 2017-04-13 DIAGNOSIS — N200 Calculus of kidney: Secondary | ICD-10-CM

## 2017-04-13 DIAGNOSIS — N281 Cyst of kidney, acquired: Secondary | ICD-10-CM | POA: Diagnosis not present

## 2017-04-13 DIAGNOSIS — Z87442 Personal history of urinary calculi: Secondary | ICD-10-CM | POA: Diagnosis present

## 2017-04-20 ENCOUNTER — Encounter (INDEPENDENT_AMBULATORY_CARE_PROVIDER_SITE_OTHER): Payer: Self-pay | Admitting: Vascular Surgery

## 2017-04-20 ENCOUNTER — Ambulatory Visit (INDEPENDENT_AMBULATORY_CARE_PROVIDER_SITE_OTHER): Payer: Managed Care, Other (non HMO)

## 2017-04-20 ENCOUNTER — Ambulatory Visit (INDEPENDENT_AMBULATORY_CARE_PROVIDER_SITE_OTHER): Payer: Managed Care, Other (non HMO) | Admitting: Vascular Surgery

## 2017-04-20 ENCOUNTER — Other Ambulatory Visit (INDEPENDENT_AMBULATORY_CARE_PROVIDER_SITE_OTHER): Payer: Self-pay | Admitting: Vascular Surgery

## 2017-04-20 VITALS — BP 146/83 | HR 78 | Resp 16 | Wt 203.0 lb

## 2017-04-20 DIAGNOSIS — I739 Peripheral vascular disease, unspecified: Secondary | ICD-10-CM

## 2017-04-20 DIAGNOSIS — I10 Essential (primary) hypertension: Secondary | ICD-10-CM | POA: Diagnosis not present

## 2017-04-20 DIAGNOSIS — I6523 Occlusion and stenosis of bilateral carotid arteries: Secondary | ICD-10-CM

## 2017-04-20 DIAGNOSIS — M7989 Other specified soft tissue disorders: Secondary | ICD-10-CM

## 2017-04-20 DIAGNOSIS — R600 Localized edema: Secondary | ICD-10-CM | POA: Diagnosis not present

## 2017-04-20 NOTE — Progress Notes (Signed)
MRN : [id number]  [name] [name] is a 57 y.o. ([birth year]) male who presents with chief complaint of  Chief Complaint  Patient presents with  . Leg Swelling    left swelling  .  History of Present Illness: Patient returns today in follow up of new leg swelling.  We saw him a couple of months ago for his PAD which was stable.  He has been sitting at his job and has noticed more left foot and ankle swelling.  No fevers or chills.  No chest pain or shortness of breath         Current Outpatient Medications  Medication Sig Dispense Refill  . aspirin EC 81 MG tablet Take 81 mg by mouth daily.    . clopidogrel (PLAVIX) 75 MG tablet Take 75 mg by mouth daily.    Marland Kitchen diltiazem (CARDIZEM CD) 120 MG 24 hr capsule Take 120 mg by mouth daily.     . metoprolol (TOPROL-XL) 200 MG 24 hr tablet Take 200 mg by mouth daily.     Marland Kitchen omega-3 acid ethyl esters (LOVAZA) 1 G capsule Take 4 g by mouth daily.     . pantoprazole (PROTONIX) 40 MG tablet Take 40 mg by mouth daily.    . rosuvastatin (CRESTOR) 40 MG tablet Take 40 mg by mouth every evening.    . tamsulosin (FLOMAX) 0.4 MG CAPS capsule Take 1 capsule (0.4 mg total) daily by mouth. 30 capsule 0   No current facility-administered medications for this visit.         Past Medical History:  Diagnosis Date  . Chest pain    Unspecified  . Chronic kidney disease   . Hypertension   . Hypertriglyceridemia   . Kidney stone on left side   . Palpitations   . Peripheral vascular disease [facility])          Past Surgical History:  Procedure Laterality Date  . ESOPHAGOGASTRODUODENOSCOPY (EGD) WITH PROPOFOL N/A 10/22/2015   Procedure: ESOPHAGOGASTRODUODENOSCOPY (EGD) WITH PROPOFOL;  Surgeon: Lucilla Lame, MD;  Location: [facility] ENDOSCOPY;  Service: Endoscopy;  Laterality: N/A;  . EXTRACORPOREAL SHOCK WAVE LITHOTRIPSY    . EXTRACORPOREAL SHOCK WAVE LITHOTRIPSY Left 09/13/2014   Procedure: EXTRACORPOREAL SHOCK WAVE LITHOTRIPSY  (ESWL);  Surgeon: Hollice Espy, MD;  Location: [facility] ORS;  Service: Urology;  Laterality: Left;  . lower extremity interventions x2 right leg Right   . PERIPHERAL VASCULAR CATHETERIZATION Right 08/20/2014   Procedure: Lower Extremity Angiography;  Surgeon: Algernon Huxley, MD;  Location: [place] [facility];  Service: Cardiovascular;  Laterality: Right;  . PERIPHERAL VASCULAR CATHETERIZATION Right 08/20/2014   Procedure: Lower Extremity Intervention;  Surgeon: Algernon Huxley, MD;  Location: [place] [facility];  Service: Cardiovascular;  Laterality: Right;    Social History Social History        Tobacco Use  . Smoking status: Former Smoker    Packs/day: 1.00    Years: 37.00    Pack years: 37.00    Last attempt to quit: 04/21/2013    Years since quitting: 3.8  . Smokeless tobacco: Never Used  Substance Use Topics  . Alcohol use: Yes    Alcohol/week: 8.4 oz    Types: 7 Glasses of wine, 7 Cans of beer per week  . Drug use: No     Social History       Social History  Substance Use Topics  . Smoking status: Former Smoker    Packs/day: 1.00    Years: 37.00  Quit date: 04/21/2013  . Smokeless tobacco: Never Used  . Alcohol use 8.4 oz/week     7 Glasses of wine, 7 Cans of beer per week   No IVDU  Family History      Family History  Problem Relation Age of Onset  . Heart failure Unknown   No bleeding or clotting disorders, no aneurysms       Allergies  Allergen Reactions  . Fenofibrate Other (See Comments)    Reaction: causes abdominal pain     REVIEW OF SYSTEMS(Negative unless checked)  Constitutional: [] Weight loss[] Fever[] Chills Cardiac:[] Chest pain[] Chest pressure[] Palpitations [] Shortness of breath when laying flat [] Shortness of breath at rest [] Shortness of breath with exertion. Vascular: [x] Pain in legs with walking[] Pain in legsat rest[] Pain in legs when laying flat [x] Claudication  [] Pain in feet when walking [] Pain in feet at rest [] Pain in feet when laying flat [] History of DVT [] Phlebitis [x] Swelling in legs [] Varicose veins [] Non-healing ulcers Pulmonary: [] Uses home oxygen [] Productive cough[] Hemoptysis [] Wheeze [] COPD [] Asthma Neurologic: [] Dizziness [] Blackouts [] Seizures [] History of stroke [] History of TIA[] Aphasia [] Temporary blindness[] Dysphagia [] Weaknessor numbness in arms [] Weakness or numbnessin legs Musculoskeletal: [] Arthritis [] Joint swelling [] Joint pain [] Low back pain Hematologic:[] Easy bruising[] Easy bleeding [] Hypercoagulable state [] Anemic  Gastrointestinal:[] Blood in stool[] Vomiting blood[] Gastroesophageal reflux/heartburn[] Abdominal pain Genitourinary: [x] Chronic kidney disease [] Difficulturination [] Frequenturination [] Burning with urination[] Hematuria Skin: [] Rashes [] Ulcers [] Wounds Psychological: [] History of anxiety[] History of major depression.     Physical Examination  BP (!) 146/83 (BP Location: Right Arm)   Pulse 78   Resp 16   Wt 92.1 kg (203 lb)   BMI 27.53 kg/m  Gen:  WD/WN, NAD Head: [place]/AT, No temporalis wasting. Ear/Nose/Throat: Hearing grossly intact, nares w/o erythema or drainage, trachea midline Eyes: Conjunctiva clear. Sclera non-icteric Neck: Supple.  No JVD.  Pulmonary:  Good air movement, no use of accessory muscles.  Cardiac: RRR, normal S1, S2 Vascular:  Vessel Right Left  Radial Palpable Palpable                          PT  1+ palpable  1+ palpable  DP Palpable  1+ palpable    Musculoskeletal: M/S 5/5 throughout.  No deformity or atrophy.  1+ left lower extremity edema. Neurologic: Sensation grossly intact in extremities.  Symmetrical.  Speech is fluent.  Psychiatric: Judgment intact, Mood & affect appropriate for pt's clinical situation. Dermatologic: No rashes or ulcers noted.  No cellulitis or open  wounds.       Labs Recent Results (from the past 2160 hour(s))  Urinalysis, Complete w Microscopic     Status: Abnormal   Collection Time: 02/06/17  2:39 PM  Result Value Ref Range   Color, Urine YELLOW (A) YELLOW   APPearance HAZY (A) CLEAR   Specific Gravity, Urine 1.019 1.005 - 1.030   pH 6.0 5.0 - 8.0   Glucose, UA NEGATIVE NEGATIVE mg/dL   Hgb urine dipstick LARGE (A) NEGATIVE   Bilirubin Urine NEGATIVE NEGATIVE   Ketones, ur NEGATIVE NEGATIVE mg/dL   Protein, ur 30 (A) NEGATIVE mg/dL   Nitrite NEGATIVE NEGATIVE   Leukocytes, UA NEGATIVE NEGATIVE   RBC / HPF TOO NUMEROUS TO COUNT 0 - 5 RBC/hpf   WBC, UA 6-30 0 - 5 WBC/hpf   Bacteria, UA NONE SEEN NONE SEEN   Squamous Epithelial / LPF NONE SEEN NONE SEEN  Urine culture     Status: None   Collection Time: 02/06/17  3:30 PM  Result Value Ref Range   Specimen Description URINE, RANDOM  Special Requests NONE    Culture      NO GROWTH Performed at [facility], [place] [address] [place], [place] [address]    Report Status 02/08/2017 FINAL   Basic metabolic panel     Status: Abnormal   Collection Time: 02/07/17  5:53 AM  Result Value Ref Range   Sodium 140 135 - 145 mmol/L   Potassium 3.7 3.5 - 5.1 mmol/L   Chloride 107 101 - 111 mmol/L   CO2 26 22 - 32 mmol/L   Glucose, Bld 126 (H) 65 - 99 mg/dL   BUN 17 6 - 20 mg/dL   Creatinine, Ser 1.45 (H) 0.61 - 1.24 mg/dL   Calcium 8.0 (L) 8.9 - 10.3 mg/dL   GFR calc non Af Amer 52 (L) >60 mL/min   GFR calc Af Amer 60 (L) >60 mL/min    Comment: (NOTE) The eGFR has been calculated using the CKD EPI equation. This calculation has not been validated in all clinical situations. eGFR's persistently <60 mL/min signify possible Chronic Kidney Disease.    Anion gap 7 5 - 15  CBC     Status: Abnormal   Collection Time: 02/07/17  5:53 AM  Result Value Ref Range   WBC 7.4 3.8 - 10.6 K/uL   RBC 4.01 (L) 4.40 - 5.90 MIL/uL   Hemoglobin 13.0 13.0 - 18.0 g/dL   HCT 38.8 (L)  40.0 - 52.0 %   MCV 96.8 80.0 - 100.0 fL   MCH 32.3 26.0 - 34.0 pg   MCHC 33.4 32.0 - 36.0 g/dL   RDW 13.9 11.5 - 14.5 %   Platelets 96 (L) 150 - 440 K/uL  HIV antibody (Routine Testing)     Status: None   Collection Time: 02/07/17  5:53 AM  Result Value Ref Range   HIV Screen 4th Generation wRfx Non Reactive Non Reactive    Comment: (NOTE) Performed At: [facility] [address] [place], [place] [id number] Rush Farmer MD [id number]   Urinalysis, Complete     Status: Abnormal   Collection Time: 03/12/17  9:36 AM  Result Value Ref Range   Specific Gravity, UA 1.020 1.005 - 1.030   pH, UA 6.0 5.0 - 7.5   Color, UA Yellow Yellow   Appearance Ur Clear Clear   Leukocytes, UA Negative Negative   Protein, UA Negative Negative/Trace   Glucose, UA Negative Negative   Ketones, UA Trace (A) Negative   RBC, UA Negative Negative   Bilirubin, UA Negative Negative   Urobilinogen, Ur 0.2 0.2 - 1.0 mg/dL   Nitrite, UA Negative Negative   Microscopic Examination See below:   Microscopic Examination     Status: Abnormal   Collection Time: 03/12/17  9:36 AM  Result Value Ref Range   WBC, UA 0-5 0 - 5 /hpf   RBC, UA 0-2 0 - 2 /hpf   Epithelial Cells (non renal) 0-10 0 - 10 /hpf   Casts Present (A) None seen /lpf   Cast Type Hyaline casts N/A   Mucus, UA Present (A) Not Estab.   Bacteria, UA Few (A) None seen/Few  Urine culture     Status: None   Collection Time: 03/12/17 10:41 AM  Result Value Ref Range   Urine Culture, Routine Final report    Organism ID, Bacteria Comment     Comment: Mixed urogenital flora 50,000-100,000 colony forming units per mL   Glucose, capillary     Status: Abnormal   Collection Time: 03/23/17  6:22 AM  Result Value Ref Range   Glucose-Capillary 147 (H) 65 - 99 mg/dL  Stone analysis     Status: None   Collection Time: 03/23/17  8:06 AM  Result Value Ref Range   Color Orange    Size Comment mm    Comment: Specimen received as whole stones.    Stone Weight KSTONE 137.7 mg   Nidus Comment     Comment: Nidus composed of Uric acid.   Uric Acid 100 %   Composition Comment     Comment: Percentage (Represents the % composition)   Photo Comment     Comment: Photograph will follow under separate cover.   Comment: Comment     Comment: (NOTE) Physician questions regarding Calculi Analysis contact [facility] at: [phone].    PLEASE NOTE: Comment     Comment: (NOTE) Calculi report with photograph will follow via computer, mail or courier delivery.    Disclaimer - Kidney Stone Analysis: Comment     Comment: (NOTE) This test was developed and its performance characteristics determined by [facility]. It has not been cleared or approved by the Food and Drug Administration. Performed At: [facility] [place], [place] [id number] Rush Farmer MD [id number] Performed at [facility], [address] [place], [place] [address]   Glucose, capillary     Status: Abnormal   Collection Time: 03/23/17  9:48 AM  Result Value Ref Range   Glucose-Capillary 159 (H) 65 - 99 mg/dL  Urine culture     Status: None   Collection Time: 03/25/17  9:54 AM  Result Value Ref Range   Urine Culture, Routine Final report    Organism ID, Bacteria No growth     Radiology US Renal  Result Date: 04/14/2017 CLINICAL DATA:  History of left-sided kidney stones EXAM: RENAL / URINARY TRACT ULTRASOUND COMPLETE COMPARISON:  CT urogram of February 06, 2017 FINDINGS: Right Kidney: Length: 12.1 cm. The renal cortical echotexture remains lower than that of the liver. There is no hydronephrosis. No stones are evident. There is a simple appearing midpole cortical cyst measuring 3.4 x 2.9 x 3.3 cm. Left Kidney: 12.9 cm in length. The renal cortical echotexture is normal similar to that on the right. There is no hydronephrosis. No stones are observed. Bladder: Appears normal for degree of bladder distention. Bilateral ureteral jets are  observed. IMPRESSION: No evidence of hydronephrosis. No definite stones are demonstrated. Simple appearing midpole cyst in the right kidney measuring 3.4 cm in greatest dimension. Electronically Signed   By: David  Martinique M.D.   On: 04/14/2017 08:03      Assessment/Plan Benign essential HTN blood pressure control important in reducing the progression of atherosclerotic disease. On appropriate oral medications.   HYPERTRIGLYCERIDEMIA lipid control important in reducing the progression of atherosclerotic disease. Continue statin therapy   Carotid stenosis His carotid duplex earlier this year reveals stable stenosis in the 1-39% range bilaterally in the carotid arteries.He will continue his aspirin, statin, and Plavix. Recheck in 1 year.   Atherosclerosis of native arteries of extremity with intermittent claudication ([place]) Had ABIs recently which are stable at 0.88 on the right and 0.69 on the left.  Good distal waveforms bilaterally. No new symptoms.  Continue ASA/Plavix/Crestor.  No need for intervention at this time.  Recheck in 6 months.  Swelling of limb To evaluate his worsening leg swelling a DVT study was performed today.  This was a normal venous study with no evidence of DVT.  Given this  finding, I think we should start wearing braces, elevating his legs, and increasing his activity as tolerated.  We will be following him for his other vascular issues and can follow-up on his leg swelling at those visits.    Leotis Pain, MD  04/20/2017 3:56 PM    This note was created with Dragon medical transcription system.  Any errors from dictation are purely unintentional

## 2017-04-20 NOTE — Assessment & Plan Note (Signed)
To evaluate his worsening leg swelling a DVT study was performed today.  This was a normal venous study with no evidence of DVT.  Given this finding, I think we should start wearing braces, elevating his legs, and increasing his activity as tolerated.  We will be following him for his other vascular issues and can follow-up on his leg swelling at those visits.

## 2017-04-21 ENCOUNTER — Encounter: Payer: Self-pay | Admitting: Urology

## 2017-04-28 ENCOUNTER — Ambulatory Visit: Payer: Managed Care, Other (non HMO) | Admitting: Urology

## 2017-04-28 ENCOUNTER — Encounter: Payer: Self-pay | Admitting: Urology

## 2017-04-28 VITALS — BP 147/82 | HR 88 | Ht 72.0 in | Wt 205.1 lb

## 2017-04-28 DIAGNOSIS — N200 Calculus of kidney: Secondary | ICD-10-CM | POA: Diagnosis not present

## 2017-04-28 NOTE — Progress Notes (Signed)
04/28/2017 1:33 PM   [name] Sep 06, 1960 [id number]  Referring provider: Kirk Ruths, MD [place] [facility] [place], [place] [address]  Chief Complaint  Patient presents with  . Follow-up    RUS results    HPI: 57 year old male presents for postop follow-up.  He has a history of recurrent uric acid nephrolithiasis.  He underwent left ureteroscopy on 03/23/2017 and required a second look procedure due to stone burden which was performed on 03/31/2017.  He removed his ureteral stent 48 hours postop and had no problems.  He has no complaints and specifically denies flank/abdominal pain or voiding symptoms/hematuria.  Stone analysis was 100% uric acid   PMH: Past Medical History:  Diagnosis Date  . Anemia    pernicious anemia  . Chest pain    Unspecified  . Chronic kidney disease   . GERD (gastroesophageal reflux disease)   . Hypertension   . Hypertriglyceridemia   . Kidney stone on left side   . Palpitations   . Peripheral vascular disease ([place])   . Pre-diabetes     Surgical History: Past Surgical History:  Procedure Laterality Date  . CYSTOSCOPY W/ RETROGRADES Bilateral 03/23/2017   Procedure: CYSTOSCOPY WITH RETROGRADE PYELOGRAM;  Surgeon: Abbie Sons, MD;  Location: [facility];  Service: Urology;  Laterality: Bilateral;  . CYSTOSCOPY W/ URETERAL STENT PLACEMENT Left 03/31/2017   Procedure: CYSTOSCOPY WITH STENT REPLACEMENT;  Surgeon: Abbie Sons, MD;  Location: [facility];  Service: Urology;  Laterality: Left;  . CYSTOSCOPY/RETROGRADE/URETEROSCOPY/STONE EXTRACTION WITH BASKET Left 03/31/2017   Procedure: CYSTOSCOPY/RETROGRADE/URETEROSCOPY/STONE EXTRACTION WITH BASKET;  Surgeon: Abbie Sons, MD;  Location: [facility];  Service: Urology;  Laterality: Left;  . CYSTOSCOPY/URETEROSCOPY/HOLMIUM LASER/STENT PLACEMENT Left 03/23/2017   Procedure: CYSTOSCOPY/URETEROSCOPY/HOLMIUM LASER/STENT PLACEMENT;  Surgeon: Abbie Sons, MD;  Location: [facility];  Service: Urology;  Laterality: Left;  . ESOPHAGOGASTRODUODENOSCOPY (EGD) WITH PROPOFOL N/A 10/22/2015   Procedure: ESOPHAGOGASTRODUODENOSCOPY (EGD) WITH PROPOFOL;  Surgeon: Lucilla Lame, MD;  Location: [facility];  Service: Endoscopy;  Laterality: N/A;  . EXTRACORPOREAL SHOCK WAVE LITHOTRIPSY    . EXTRACORPOREAL SHOCK WAVE LITHOTRIPSY Left 09/13/2014   Procedure: EXTRACORPOREAL SHOCK WAVE LITHOTRIPSY (ESWL);  Surgeon: Hollice Espy, MD;  Location: [facility];  Service: Urology;  Laterality: Left;  . lower extremity interventions x2 right leg Right   . PERIPHERAL VASCULAR CATHETERIZATION Right 08/20/2014   Procedure: Lower Extremity Angiography;  Surgeon: Algernon Huxley, MD;  Location: [place] [facility];  Service: Cardiovascular;  Laterality: Right;  . PERIPHERAL VASCULAR CATHETERIZATION Right 08/20/2014   Procedure: Lower Extremity Intervention;  Surgeon: Algernon Huxley, MD;  Location: [place] [facility];  Service: Cardiovascular;  Laterality: Right;    Home Medications:  Allergies as of 04/28/2017      Reactions   Fenofibrate Other (See Comments)   Reaction: causes abdominal pain      Medication List        Accurate as of 04/28/17  1:33 PM. Always use your most recent med list.          aspirin EC 81 MG tablet Take 81 mg by mouth daily.   clopidogrel 75 MG tablet Commonly known as:  PLAVIX Take 75 mg by mouth daily.   diltiazem 120 MG 24 hr capsule Commonly known as:  CARDIZEM CD Take 120 mg by mouth daily.   metoprolol 200 MG 24 hr tablet Commonly known as:  TOPROL-XL Take 200 mg by mouth daily.   multivitamin  with minerals Tabs tablet Take 1 tablet by mouth daily.   omega-3 acid ethyl esters 1 g capsule Commonly known as:  LOVAZA Take 4 g by mouth daily.   pantoprazole 40 MG tablet Commonly known as:  PROTONIX Take 40 mg by mouth daily.   rosuvastatin 40 MG tablet Commonly known as:  CRESTOR Take 40 mg by mouth daily.        Allergies:  Allergies  Allergen Reactions  . Fenofibrate Other (See Comments)    Reaction: causes abdominal pain    Family History: Family History  Problem Relation Age of Onset  . Heart failure Unknown     Social History:  reports that he quit smoking about 4 years ago. He has a 37.00 pack-year smoking history. he has never used smokeless tobacco. He reports that he drinks about 8.4 oz of alcohol per week. He reports that he does not use drugs.  ROS: UROLOGY Frequent Urination?: No Hard to postpone urination?: No Burning/pain with urination?: No Get up at night to urinate?: Yes Leakage of urine?: No Urine stream starts and stops?: No Trouble starting stream?: No Do you have to strain to urinate?: No Blood in urine?: No Urinary tract infection?: No Sexually transmitted disease?: No Injury to kidneys or bladder?: No Painful intercourse?: No Weak stream?: No Erection problems?: No Penile pain?: No  Gastrointestinal Nausea?: No Vomiting?: No Indigestion/heartburn?: No Diarrhea?: No Constipation?: No  Constitutional Fever: No Night sweats?: No Weight loss?: No Fatigue?: No  Skin Skin rash/lesions?: No Itching?: No  Eyes Blurred vision?: No Double vision?: No  Ears/Nose/Throat Sore throat?: No Sinus problems?: No  Hematologic/Lymphatic Swollen glands?: No Easy bruising?: Yes  Cardiovascular Leg swelling?: Yes Chest pain?: No  Respiratory Cough?: No Shortness of breath?: No  Endocrine Excessive thirst?: No  Musculoskeletal Back pain?: No Joint pain?: No  Neurological Headaches?: No Dizziness?: No  Psychologic Depression?: No Anxiety?: No  Physical Exam: BP (!) 147/82 (BP Location: Right Arm, Patient Position: Sitting, Cuff Size: Normal)   Pulse 88   Ht 6' (1.829 m)   Wt 205 lb 1.6 oz (93 kg)   BMI 27.82 kg/m   Constitutional:  Alert and oriented, No acute distress. HEENT: [place] AT, moist mucus membranes.  Trachea midline, no  masses. Cardiovascular: No clubbing, cyanosis, or edema. Respiratory: Normal respiratory effort, no increased work of breathing. GI: Abdomen is soft, nontender, nondistended, no abdominal masses GU: No CVA tenderness. Skin: No rashes, bruises or suspicious lesions. Lymph: No cervical or inguinal adenopathy. Neurologic: Grossly intact, no focal deficits, moving all 4 extremities. Psychiatric: Normal mood and affect.  Laboratory Data: Lab Results  Component Value Date   WBC 7.4 02/07/2017   HGB 13.0 02/07/2017   HCT 38.8 (L) 02/07/2017   MCV 96.8 02/07/2017   PLT 96 (L) 02/07/2017    Lab Results  Component Value Date   CREATININE 1.45 (H) 02/07/2017    Lab Results  Component Value Date   HGBA1C 5.8 10/19/2015    Urinalysis Lab Results  Component Value Date   SPECGRAV 1.020 03/12/2017   PHUR 6.0 03/12/2017   COLORU Yellow 03/12/2017   APPEARANCEUR Clear 03/12/2017   LEUKOCYTESUR Negative 03/12/2017   PROTEINUR Negative 03/12/2017   GLUCOSEU Negative 03/12/2017   KETONESU Trace (A) 03/12/2017   RBCU Negative 03/12/2017   BILIRUBINUR Negative 03/12/2017   UUROB 0.2 03/12/2017   NITRITE Negative 03/12/2017    Lab Results  Component Value Date   LABMICR See below: 03/12/2017   WBCUA 0-5  03/12/2017   RBCUA 0-2 03/12/2017   LABEPIT 0-10 03/12/2017   MUCUS Present (A) 03/12/2017   BACTERIA Few (A) 03/12/2017     Results for orders placed during the hospital encounter of 04/13/17  US RENAL   Narrative CLINICAL DATA:  History of left-sided kidney stones  EXAM: RENAL / URINARY TRACT ULTRASOUND COMPLETE  COMPARISON:  CT urogram of February 06, 2017  FINDINGS: Right Kidney:  Length: 12.1 cm. The renal cortical echotexture remains lower than that of the liver. There is no hydronephrosis. No stones are evident. There is a simple appearing midpole cortical cyst measuring 3.4 x 2.9 x 3.3 cm.  Left Kidney:  12.9 cm in length. The renal cortical echotexture is  normal similar to that on the right. There is no hydronephrosis. No stones are observed.  Bladder:  Appears normal for degree of bladder distention. Bilateral ureteral jets are observed.  IMPRESSION: No evidence of hydronephrosis. No definite stones are demonstrated. Simple appearing midpole cyst in the right kidney measuring 3.4 cm in greatest dimension.   Electronically Signed   By: David  Martinique M.D.   On: 04/14/2017 08:03      Assessment & Plan:  Doing well status post left ureteroscopic stone removal.  He states Dr. Elnoria Howard had previously recommended a metabolic evaluation however he has declined.  I again discussed this with him and he does not desire to proceed.  He is taking over-the-counter citrate.  I discussed reducing protein intake and 24-hour hydration.  Follow-up 6 months and he was instructed to call earlier for symptoms of recurrent stones.   Return in about 6 months (around 10/26/2017) for Recheck.  Abbie Sons, [place] [address], [place], [place] [address] [phone]

## 2017-05-06 DIAGNOSIS — R739 Hyperglycemia, unspecified: Secondary | ICD-10-CM | POA: Insufficient documentation

## 2017-05-06 DIAGNOSIS — E118 Type 2 diabetes mellitus with unspecified complications: Secondary | ICD-10-CM | POA: Insufficient documentation

## 2017-05-13 ENCOUNTER — Ambulatory Visit
Admission: RE | Admit: 2017-05-13 | Discharge: 2017-05-13 | Disposition: A | Discharge status: Home / Self Care | Payer: Managed Care, Other (non HMO) | Source: Ambulatory Visit | Attending: Family Medicine | Admitting: Family Medicine

## 2017-05-13 ENCOUNTER — Other Ambulatory Visit: Payer: Self-pay | Admitting: Family Medicine

## 2017-05-13 DIAGNOSIS — R600 Localized edema: Secondary | ICD-10-CM

## 2017-07-06 ENCOUNTER — Ambulatory Visit (INDEPENDENT_AMBULATORY_CARE_PROVIDER_SITE_OTHER): Payer: Managed Care, Other (non HMO) | Admitting: Vascular Surgery

## 2017-07-06 ENCOUNTER — Encounter (INDEPENDENT_AMBULATORY_CARE_PROVIDER_SITE_OTHER): Payer: Self-pay | Admitting: Vascular Surgery

## 2017-07-06 VITALS — BP 112/69 | HR 70 | Resp 16 | Ht 72.0 in | Wt 202.0 lb

## 2017-07-06 DIAGNOSIS — M7989 Other specified soft tissue disorders: Secondary | ICD-10-CM

## 2017-07-06 DIAGNOSIS — I10 Essential (primary) hypertension: Secondary | ICD-10-CM

## 2017-07-06 DIAGNOSIS — I6523 Occlusion and stenosis of bilateral carotid arteries: Secondary | ICD-10-CM | POA: Diagnosis not present

## 2017-07-06 DIAGNOSIS — I70213 Atherosclerosis of native arteries of extremities with intermittent claudication, bilateral legs: Secondary | ICD-10-CM | POA: Diagnosis not present

## 2017-07-06 NOTE — Progress Notes (Signed)
MRN : [id number]  [name] [name] is a 57 y.o. (07/18/1960) male who presents with chief complaint of  Chief Complaint  Patient presents with  . Leg Pain    left leg pain   .  History of Present Illness: Patient returns today in follow up of worsening left leg pain.  He has been wearing compression stockings which has improved his swelling, but the left leg continues to hurt more.  This is mostly muscular pain as well as foot and ankle pain.  The calf is probably the most affected area.  The right leg is really not bothering him much.  He describes this is nearly normal.  He has previously been shown to have mild reduction in his right ABI and moderate reduction in his left ABI a few months ago.        Current Outpatient Medications  Medication Sig Dispense Refill  . aspirin EC 81 MG tablet Take 81 mg by mouth daily.    . clopidogrel (PLAVIX) 75 MG tablet Take 75 mg by mouth daily.    Marland Kitchen diltiazem (CARDIZEM CD) 120 MG 24 hr capsule Take 120 mg by mouth daily.     . metoprolol (TOPROL-XL) 200 MG 24 hr tablet Take 200 mg by mouth daily.     Marland Kitchen omega-3 acid ethyl esters (LOVAZA) 1 G capsule Take 4 g by mouth daily.     . pantoprazole (PROTONIX) 40 MG tablet Take 40 mg by mouth daily.    . rosuvastatin (CRESTOR) 40 MG tablet Take 40 mg by mouth every evening.    . tamsulosin (FLOMAX) 0.4 MG CAPS capsule Take 1 capsule (0.4 mg total) daily by mouth. 30 capsule 0   No current facility-administered medications for this visit.        Past Medical History:  Diagnosis Date  . Chest pain    Unspecified  . Chronic kidney disease   . Hypertension   . Hypertriglyceridemia   . Kidney stone on left side   . Palpitations   . Peripheral vascular disease [facility])          Past Surgical History:  Procedure Laterality Date  . ESOPHAGOGASTRODUODENOSCOPY (EGD) WITH PROPOFOL N/A 10/22/2015   Procedure: ESOPHAGOGASTRODUODENOSCOPY (EGD) WITH PROPOFOL;  Surgeon: Lucilla Lame, MD; Location: [facility] END[facility]; Service: Endoscopy; Laterality: N/A;  . EXTRACORPOREAL SHOCK WAVE LITHOTRIPSY    . EXTRACORPOREAL SHOCK WAVE LITHOTRIPSY Left 09/13/2014   Procedure: EXTRACORPOREAL SHOCK WAVE LITHOTRIPSY (ESWL); Surgeon: Hollice Espy, MD; Location: [facility] ORS; Service: Urology; Laterality: Left;  . lower extremity interventions x2 right leg Right   . PERIPHERAL VASCULAR CATHETERIZATION Right 08/20/2014   Procedure: Lower Extremity Angiography; Surgeon: Algernon Huxley, MD; Location: [place] [facility]; Service: Cardiovascular; Laterality: Right;  . PERIPHERAL VASCULAR CATHETERIZATION Right 08/20/2014   Procedure: Lower Extremity Intervention; Surgeon: Algernon Huxley, MD; Location: [place] [facility]; Service: Cardiovascular; Laterality: Right;    Social History Social History        Tobacco Use  . Smoking status: Former Smoker    Packs/day: 1.00    Years: 37.00    Pack years: 37.00    Last attempt to quit: 04/21/2013    Years since quitting: 3.8  . Smokeless tobacco: Never Used  Substance Use Topics  . Alcohol use: Yes    Alcohol/week: 8.4 oz    Types: 7 Glasses of wine, 7 Cans of beer per week  . Drug use: No     Social History  Social History  Substance Use Topics  . Smoking status: Former Smoker    Packs/day: 1.00    Years: 37.00    Quit date: 04/21/2013  . Smokeless tobacco: Never Used  . Alcohol use 8.4 oz/week     7 Glasses of wine, 7 Cans of beer per week   No IVDU  Family History      Family History  Problem Relation Age of Onset  . Heart failure Unknown   No bleeding or clotting disorders, no aneurysms       Allergies  Allergen Reactions  . Fenofibrate Other (See Comments)    Reaction: causes abdominal pain     REVIEW OF SYSTEMS(Negative unless checked)  Constitutional: [] Weight loss[] Fever[] Chills Cardiac:[] Chest pain[] Chest  pressure[] Palpitations [] Shortness of breath when laying flat [] Shortness of breath at rest [] Shortness of breath with exertion. Vascular: [x] Pain in legs with walking[] Pain in legsat rest[] Pain in legs when laying flat [x] Claudication [] Pain in feet when walking [] Pain in feet at rest [] Pain in feet when laying flat [] History of DVT [] Phlebitis [x] Swelling in legs [] Varicose veins [] Non-healing ulcers Pulmonary: [] Uses home oxygen [] Productive cough[] Hemoptysis [] Wheeze [] COPD [] Asthma Neurologic: [] Dizziness [] Blackouts [] Seizures [] History of stroke [] History of TIA[] Aphasia [] Temporary blindness[] Dysphagia [] Weaknessor numbness in arms [] Weakness or numbnessin legs Musculoskeletal: [] Arthritis [] Joint swelling [] Joint pain [] Low back pain Hematologic:[] Easy bruising[] Easy bleeding [] Hypercoagulable state [] Anemic  Gastrointestinal:[] Blood in stool[] Vomiting blood[] Gastroesophageal reflux/heartburn[] Abdominal pain Genitourinary: [x] Chronic kidney disease [] Difficulturination [] Frequenturination [] Burning with urination[] Hematuria Skin: [] Rashes [] Ulcers [] Wounds Psychological: [] History of anxiety[] History of major depression.     Physical Examination  BP 112/69 (BP Location: Right Arm)   Pulse 70   Resp 16   Ht 6' (1.829 m)   Wt 91.6 kg (202 lb)   BMI 27.40 kg/m  Gen:  WD/WN, NAD Head: [place]/AT, No temporalis wasting. Ear/Nose/Throat: Hearing grossly intact, nares w/o erythema or drainage Eyes: Conjunctiva clear. Sclera non-icteric Neck: Supple.  Trachea midline Pulmonary:  Good air movement, no use of accessory muscles.  Cardiac: RRR, no JVD Vascular:  Vessel Right Left  Radial Palpable Palpable                          PT  1+ palpable  trace palpable  DP Palpable  1+ palpable    Musculoskeletal: M/S 5/5 throughout.  No deformity or atrophy.  Trace bilateral  lower extremity edema. Neurologic: Sensation grossly intact in extremities.  Symmetrical.  Speech is fluent.  Psychiatric: Judgment intact, Mood & affect appropriate for pt's clinical situation. Dermatologic: No rashes or ulcers noted.  No cellulitis or open wounds.       Labs No results found for this or any previous visit (from the past 2160 hour(s)).  Radiology No results found.   Assessment/Plan Benign essential HTN blood pressure control important in reducing the progression of atherosclerotic disease. On appropriate oral medications.   HYPERTRIGLYCERIDEMIA lipid control important in reducing the progression of atherosclerotic disease. Continue statin therapy   Carotid stenosis His carotid duplexearlier this yearreveals stable stenosis in the 1-39% range bilaterally in the carotid arteries.He will continue his aspirin, statin, and Plavix. Recheck in 1 year.   Swelling of limb Better with compression stockings  Atherosclerosis of native arteries of extremity with intermittent claudication (HCC) His left lower extremity is much more symptomatic and he has a known significant reduction in perfusion in the left leg.  At this point, I think it reasonable to consider intervention to try to improve his perfusion.  If that does not improve  his symptoms, investigation of other sources such as reflux or musculoskeletal issues will then be given.  Risks and benefits were discussed with the patient who is agreeable to proceed.    Leotis Pain, MD  07/06/2017 4:41 PM    This note was created with Dragon medical transcription system.  Any errors from dictation are purely unintentional

## 2017-07-06 NOTE — Patient Instructions (Signed)

## 2017-07-06 NOTE — Assessment & Plan Note (Signed)
His left lower extremity is much more symptomatic and he has a known significant reduction in perfusion in the left leg.  At this point, I think it reasonable to consider intervention to try to improve his perfusion.  If that does not improve his symptoms, investigation of other sources such as reflux or musculoskeletal issues will then be given.  Risks and benefits were discussed with the patient who is agreeable to proceed.

## 2017-07-06 NOTE — Assessment & Plan Note (Signed)
Better with compression stockings.  

## 2017-07-07 ENCOUNTER — Encounter (INDEPENDENT_AMBULATORY_CARE_PROVIDER_SITE_OTHER): Payer: Self-pay

## 2017-07-12 ENCOUNTER — Other Ambulatory Visit (INDEPENDENT_AMBULATORY_CARE_PROVIDER_SITE_OTHER): Payer: Self-pay | Admitting: Vascular Surgery

## 2017-07-13 ENCOUNTER — Encounter
Admission: RE | Admit: 2017-07-13 | Discharge: 2017-07-13 | Disposition: A | Discharge status: Home / Self Care | Payer: Managed Care, Other (non HMO) | Source: Ambulatory Visit | Attending: Vascular Surgery | Admitting: Vascular Surgery

## 2017-07-13 ENCOUNTER — Other Ambulatory Visit: Payer: Self-pay

## 2017-07-13 DIAGNOSIS — I70212 Atherosclerosis of native arteries of extremities with intermittent claudication, left leg: Secondary | ICD-10-CM | POA: Diagnosis not present

## 2017-07-13 DIAGNOSIS — I6523 Occlusion and stenosis of bilateral carotid arteries: Secondary | ICD-10-CM | POA: Diagnosis not present

## 2017-07-13 DIAGNOSIS — Z888 Allergy status to other drugs, medicaments and biological substances status: Secondary | ICD-10-CM | POA: Diagnosis not present

## 2017-07-13 DIAGNOSIS — Z79899 Other long term (current) drug therapy: Secondary | ICD-10-CM | POA: Diagnosis not present

## 2017-07-13 DIAGNOSIS — Z9889 Other specified postprocedural states: Secondary | ICD-10-CM | POA: Diagnosis not present

## 2017-07-13 DIAGNOSIS — Z8249 Family history of ischemic heart disease and other diseases of the circulatory system: Secondary | ICD-10-CM | POA: Diagnosis not present

## 2017-07-13 DIAGNOSIS — Z7982 Long term (current) use of aspirin: Secondary | ICD-10-CM | POA: Diagnosis not present

## 2017-07-13 DIAGNOSIS — Z87891 Personal history of nicotine dependence: Secondary | ICD-10-CM | POA: Diagnosis not present

## 2017-07-13 DIAGNOSIS — I129 Hypertensive chronic kidney disease with stage 1 through stage 4 chronic kidney disease, or unspecified chronic kidney disease: Secondary | ICD-10-CM | POA: Diagnosis not present

## 2017-07-13 DIAGNOSIS — Z7902 Long term (current) use of antithrombotics/antiplatelets: Secondary | ICD-10-CM | POA: Diagnosis not present

## 2017-07-13 DIAGNOSIS — E781 Pure hyperglyceridemia: Secondary | ICD-10-CM | POA: Diagnosis not present

## 2017-07-13 DIAGNOSIS — Z87442 Personal history of urinary calculi: Secondary | ICD-10-CM | POA: Diagnosis not present

## 2017-07-13 DIAGNOSIS — N189 Chronic kidney disease, unspecified: Secondary | ICD-10-CM | POA: Diagnosis not present

## 2017-07-13 HISTORY — DX: Nontoxic single thyroid nodule: E04.1

## 2017-07-13 HISTORY — DX: Cardiac arrhythmia, unspecified: I49.9

## 2017-07-13 LAB — CREATININE, SERUM
Creatinine, Ser: 0.77 mg/dL (ref 0.61–1.24)
GFR calc non Af Amer: >60 mL/min (ref >60)

## 2017-07-13 LAB — BUN: BUN: 15 mg/dL (ref 6–20)

## 2017-07-13 NOTE — Pre-Procedure Instructions (Signed)
Patient is aware of arrival time to specials for his procedure on 07/15/2017. Also, reinforced that he does not need to stop his Aspirin or Plavix prior to this procedure.

## 2017-07-14 MED ORDER — CEFAZOLIN SODIUM-DEXTROSE 2-4 GM/100ML-% IV SOLN
2.0000 g | Freq: Once | INTRAVENOUS | Status: AC
  Administered 2017-07-15: 2 g via INTRAVENOUS

## 2017-07-15 ENCOUNTER — Encounter
Admission: RE | Disposition: A | Discharge status: Home / Self Care | Payer: Self-pay | Source: Ambulatory Visit | Attending: Vascular Surgery

## 2017-07-15 ENCOUNTER — Ambulatory Visit
Admission: RE | Admit: 2017-07-15 | Discharge: 2017-07-15 | Disposition: A | Discharge status: Home / Self Care | Payer: Managed Care, Other (non HMO) | Source: Ambulatory Visit | Attending: Vascular Surgery | Admitting: Vascular Surgery

## 2017-07-15 DIAGNOSIS — I70219 Atherosclerosis of native arteries of extremities with intermittent claudication, unspecified extremity: Secondary | ICD-10-CM

## 2017-07-15 DIAGNOSIS — Z7902 Long term (current) use of antithrombotics/antiplatelets: Secondary | ICD-10-CM | POA: Insufficient documentation

## 2017-07-15 DIAGNOSIS — Z87891 Personal history of nicotine dependence: Secondary | ICD-10-CM | POA: Insufficient documentation

## 2017-07-15 DIAGNOSIS — Z7982 Long term (current) use of aspirin: Secondary | ICD-10-CM | POA: Insufficient documentation

## 2017-07-15 DIAGNOSIS — Z87442 Personal history of urinary calculi: Secondary | ICD-10-CM | POA: Insufficient documentation

## 2017-07-15 DIAGNOSIS — I6523 Occlusion and stenosis of bilateral carotid arteries: Secondary | ICD-10-CM | POA: Insufficient documentation

## 2017-07-15 DIAGNOSIS — E781 Pure hyperglyceridemia: Secondary | ICD-10-CM | POA: Insufficient documentation

## 2017-07-15 DIAGNOSIS — Z8249 Family history of ischemic heart disease and other diseases of the circulatory system: Secondary | ICD-10-CM | POA: Insufficient documentation

## 2017-07-15 DIAGNOSIS — Z888 Allergy status to other drugs, medicaments and biological substances status: Secondary | ICD-10-CM | POA: Insufficient documentation

## 2017-07-15 DIAGNOSIS — Z79899 Other long term (current) drug therapy: Secondary | ICD-10-CM | POA: Insufficient documentation

## 2017-07-15 DIAGNOSIS — I70212 Atherosclerosis of native arteries of extremities with intermittent claudication, left leg: Secondary | ICD-10-CM | POA: Diagnosis not present

## 2017-07-15 DIAGNOSIS — I129 Hypertensive chronic kidney disease with stage 1 through stage 4 chronic kidney disease, or unspecified chronic kidney disease: Secondary | ICD-10-CM | POA: Insufficient documentation

## 2017-07-15 DIAGNOSIS — Z9889 Other specified postprocedural states: Secondary | ICD-10-CM | POA: Insufficient documentation

## 2017-07-15 DIAGNOSIS — N189 Chronic kidney disease, unspecified: Secondary | ICD-10-CM | POA: Insufficient documentation

## 2017-07-15 HISTORY — PX: LOWER EXTREMITY ANGIOGRAPHY: CATH118251

## 2017-07-15 SURGERY — LOWER EXTREMITY ANGIOGRAPHY
Anesthesia: Moderate Sedation | Laterality: Left

## 2017-07-15 MED ORDER — HYDRALAZINE HCL 20 MG/ML IJ SOLN: 5.0000 mg | INTRAMUSCULAR | Status: DC | PRN

## 2017-07-15 MED ORDER — SODIUM CHLORIDE 0.9 % IV SOLN: INTRAVENOUS | Status: DC

## 2017-07-15 MED ORDER — ACETAMINOPHEN 325 MG PO TABS: 650.0000 mg | ORAL_TABLET | ORAL | Status: DC | PRN

## 2017-07-15 MED ORDER — SODIUM CHLORIDE 0.9 % IV SOLN
INTRAVENOUS | Status: DC
  Administered 2017-07-15: 09:00:00 via INTRAVENOUS

## 2017-07-15 MED ORDER — ONDANSETRON HCL 4 MG/2ML IJ SOLN: 4.0000 mg | Freq: Four times a day (QID) | INTRAMUSCULAR | Status: DC | PRN

## 2017-07-15 MED ORDER — HEPARIN SODIUM (PORCINE) 1000 UNIT/ML IJ SOLN
INTRAMUSCULAR | Status: AC
  Filled 2017-07-15: qty 1

## 2017-07-15 MED ORDER — MIDAZOLAM HCL 2 MG/2ML IJ SOLN
INTRAMUSCULAR | Status: DC | PRN
  Administered 2017-07-15: 1 mg via INTRAVENOUS
  Administered 2017-07-15: 2 mg via INTRAVENOUS
  Administered 2017-07-15: 1 mg via INTRAVENOUS

## 2017-07-15 MED ORDER — HEPARIN SODIUM (PORCINE) 1000 UNIT/ML IJ SOLN
INTRAMUSCULAR | Status: DC | PRN
  Administered 2017-07-15: 5000 [IU] via INTRAVENOUS

## 2017-07-15 MED ORDER — HEPARIN (PORCINE) IN NACL 2-0.9 UNIT/ML-% IJ SOLN
INTRAMUSCULAR | Status: AC
  Filled 2017-07-15: qty 1000

## 2017-07-15 MED ORDER — FENTANYL CITRATE (PF) 100 MCG/2ML IJ SOLN
INTRAMUSCULAR | Status: AC
  Filled 2017-07-15: qty 2

## 2017-07-15 MED ORDER — SODIUM CHLORIDE 0.9 % IV SOLN: 250.0000 mL | INTRAVENOUS | Status: DC | PRN

## 2017-07-15 MED ORDER — CEFAZOLIN SODIUM-DEXTROSE 2-4 GM/100ML-% IV SOLN
INTRAVENOUS | Status: AC
  Filled 2017-07-15: qty 100

## 2017-07-15 MED ORDER — SODIUM CHLORIDE 0.9% FLUSH: 3.0000 mL | INTRAVENOUS | Status: DC | PRN

## 2017-07-15 MED ORDER — MIDAZOLAM HCL 5 MG/5ML IJ SOLN
INTRAMUSCULAR | Status: AC
  Filled 2017-07-15: qty 5

## 2017-07-15 MED ORDER — FENTANYL CITRATE (PF) 100 MCG/2ML IJ SOLN
INTRAMUSCULAR | Status: DC | PRN
  Administered 2017-07-15 (×3): 50 ug via INTRAVENOUS

## 2017-07-15 MED ORDER — HYDROMORPHONE HCL 1 MG/ML IJ SOLN: 1.0000 mg | Freq: Once | INTRAMUSCULAR | Status: DC | PRN

## 2017-07-15 MED ORDER — FAMOTIDINE 20 MG PO TABS: 40.0000 mg | ORAL_TABLET | ORAL | Status: DC | PRN

## 2017-07-15 MED ORDER — SODIUM CHLORIDE 0.9% FLUSH: 3.0000 mL | Freq: Two times a day (BID) | INTRAVENOUS | Status: DC

## 2017-07-15 MED ORDER — ONDANSETRON HCL 4 MG/2ML IJ SOLN
4.0000 mg | Freq: Four times a day (QID) | INTRAMUSCULAR | Status: DC | PRN
Start: 2017-07-15 — End: 2017-07-15

## 2017-07-15 MED ORDER — METHYLPREDNISOLONE SODIUM SUCC 125 MG IJ SOLR: 125.0000 mg | INTRAMUSCULAR | Status: DC | PRN

## 2017-07-15 MED ORDER — LABETALOL HCL 5 MG/ML IV SOLN: 10.0000 mg | INTRAVENOUS | Status: DC | PRN

## 2017-07-15 MED ORDER — LIDOCAINE-EPINEPHRINE (PF) 1 %-1:200000 IJ SOLN
INTRAMUSCULAR | Status: AC
  Filled 2017-07-15: qty 30

## 2017-07-15 SURGICAL SUPPLY — 17 items
BALLN LUTONIX 5X220X130 (BALLOONS) ×3
BALLN LUTONIX DCB 5X100X130 (BALLOONS) ×3
BALLOON LUTONIX 5X220X130 (BALLOONS) ×1 IMPLANT
BALLOON LUTONIX DCB 5X100X130 (BALLOONS) ×1 IMPLANT
CATH BEACON 5 .038 100 VERT TP (CATHETERS) ×3 IMPLANT
CATH SIZING 5F 100CM .035 PIG (CATHETERS) ×3 IMPLANT
DEVICE PRESTO INFLATION (MISCELLANEOUS) ×3 IMPLANT
DEVICE STARCLOSE SE CLOSURE (Vascular Products) ×3 IMPLANT
GLIDEWIRE ADV .035X260CM (WIRE) ×3 IMPLANT
LIFESTENT SOLO 6X200X135 (Permanent Stent) ×3 IMPLANT
PACK ANGIOGRAPHY (CUSTOM PROCEDURE TRAY) ×3 IMPLANT
SHEATH ANL2 6FRX45 HC (SHEATH) ×3 IMPLANT
SHEATH BRITE TIP 5FRX11 (SHEATH) ×3 IMPLANT
STENT LIFESTENT 5F 6X100X135 (Permanent Stent) ×3 IMPLANT
SYR MEDRAD MARK V 150ML (SYRINGE) ×3 IMPLANT
TUBING CONTRAST HIGH PRESS 72 (TUBING) ×3 IMPLANT
WIRE J 3MM .035X145CM (WIRE) ×3 IMPLANT

## 2017-07-15 NOTE — H&P (Signed)
[facility] History & Physical Update  The patient was interviewed and re-examined.  The patient's previous History and Physical has been reviewed and is unchanged.  There is no change in the plan of care. We plan to proceed with the scheduled procedure.  Leotis Pain, MD  07/15/2017, 8:47 AM

## 2017-07-15 NOTE — Op Note (Signed)
[facility] Percutaneous Study/Intervention Procedural Note   Date of Surgery: 07/15/2017  Surgeon(s):Conlee Sliter   Assistants:none  Pre-operative Diagnosis: PAD with claudication left lower extremity  Post-operative diagnosis: Same  Procedure(s) Performed: 1. Ultrasound guidance for vascular access right femoral artery 2. Catheter placement into left popliteal artery from right femoral approach 3. Aortogram and selective left lower extremity angiogram 4. Percutaneous transluminal angioplasty of the left SFA with 5 mm diameter by 22 cm length and 5 mm diameter by 10 cm length Lutonix drug-coated angioplasty balloons 5. Self-expanding stent placement to the left SFA for significant stenosis and dissection after angioplasty using a 6 mm diameter by 20 cm length stent and a 6 mm diameter by 10 cm length stent  6.  StarClose closure device right femoral artery  EBL: 5 cc  Contrast: 60 cc  Fluoro Time: 4.2 minutes  Moderate Conscious Sedation Time: approximately 30 minutes using 4 mg of Versed and 150 Mcg of Fentanyl  Indications: Patient is a 57 y.o.male with worsening claudication of the left lower extremity. The patient has noninvasive study showing reduced ABI worse on the left than the right with SFA stenosis. The patient is brought in for angiography for further evaluation and potential treatment.  Risks and benefits are discussed and informed consent is obtained  Procedure: The patient was identified and appropriate procedural time out was performed. The patient was then placed supine on the table and prepped and draped in the usual sterile fashion.Moderate conscious sedation was administered during a face to face encounter with the patient throughout the procedure with my supervision of the RN administering medicines and monitoring the patient's vital signs, pulse oximetry,  telemetry and mental status throughout from the start of the procedure until the patient was taken to the recovery room. Ultrasound was used to evaluate the right common femoral artery. It was patent . A digital ultrasound image was acquired. A Seldinger needle was used to access the right common femoral artery under direct ultrasound guidance and a permanent image was performed. A 0.035 J wire was advanced without resistance and a 5Fr sheath was placed. Pigtail catheter was placed into the aorta and an AP aortogram was performed. This demonstrated normal renal arteries and normal aorta and iliac segments without significant stenosis. I then crossed the aortic bifurcation and advanced to the left femoral head. Selective left lower extremity angiogram was then performed. This demonstrated a normal common femoral artery, profunda femoris artery, with a diffusely diseased superficial femoral artery from the proximal superficial femoral artery to the distal superficial femoral artery with multiple areas of greater than 90% stenosis.  The popliteal artery then normalized that he had two-vessel runoff distally with the posterior tibial artery being the dominant runoff to the foot without focal stenosis. The patient was systemically heparinized and a 6 [place] Ansell sheath was then placed over the [employer] wire. I then used a Kumpe catheter and the advantage wire to navigate through the stenosis in the SFA and confirm intraluminal flow in the popliteal artery with little difficulty.  Then replaced the wire and proceeded with treatment.  A 5 mm diameter by 22 cm length Lutonix drug-coated angioplasty balloon was inflated from the distal SFA up to the proximal to mid SFA.  This was inflated to 6 atm for 1 minute.  A 5 mm diameter by 10 cm length Lutonix drug-coated angioplasty balloon was then inflated in the proximal SFA up to 8 atm for 1 minute.  There were still  several areas of greater than 50% stenosis with  dissection throughout the treated areas so I elected to place stents.  A 6 mm diameter by 20 cm length life stent was deployed from just above Hunter's canal up to the proximal to mid SFA.  A 6 mm diameter by 10 cm length life stent was deployed in the proximal SFA.  These were postdilated with 5 mm balloons with excellent angiographic completion result and less than 10% residual stenosis. I elected to terminate the procedure. The sheath was removed and StarClose closure device was deployed in the right femoral artery with excellent hemostatic result. The patient was taken to the recovery room in stable condition having tolerated the procedure well.  Findings:  Aortogram:Normal renal arteries, normal aorta and iliac arteries without significant stenosis Left lower Extremity:  Normal common femoral artery, profunda femoris artery, with a diffusely diseased superficial femoral artery from the proximal superficial femoral artery to the distal superficial femoral artery with multiple areas of greater than 90% stenosis.  The popliteal artery then normalized that he had two-vessel runoff distally with the posterior tibial artery being the dominant runoff to the foot without focal stenosis.   Disposition: Patient was taken to the recovery room in stable condition having tolerated the procedure well.  Complications: None  Leotis Pain 07/15/2017 10:24 AM   This note was created with Dragon Medical transcription system. Any errors in dictation are purely unintentional.

## 2017-07-15 NOTE — Progress Notes (Signed)
Dr. Lucky Cowboy in at bedside to speak with pt. ; pt. Verbalized understanding of procedural discussion and follow-up.

## 2017-08-24 ENCOUNTER — Ambulatory Visit (INDEPENDENT_AMBULATORY_CARE_PROVIDER_SITE_OTHER): Payer: 59 | Admitting: Vascular Surgery

## 2017-08-24 ENCOUNTER — Encounter (INDEPENDENT_AMBULATORY_CARE_PROVIDER_SITE_OTHER): Payer: 59

## 2017-08-31 ENCOUNTER — Ambulatory Visit (INDEPENDENT_AMBULATORY_CARE_PROVIDER_SITE_OTHER): Payer: Managed Care, Other (non HMO)

## 2017-08-31 ENCOUNTER — Ambulatory Visit (INDEPENDENT_AMBULATORY_CARE_PROVIDER_SITE_OTHER): Payer: 59 | Admitting: Vascular Surgery

## 2017-08-31 ENCOUNTER — Encounter (INDEPENDENT_AMBULATORY_CARE_PROVIDER_SITE_OTHER): Payer: Self-pay | Admitting: Vascular Surgery

## 2017-08-31 ENCOUNTER — Ambulatory Visit (INDEPENDENT_AMBULATORY_CARE_PROVIDER_SITE_OTHER): Payer: Managed Care, Other (non HMO) | Admitting: Vascular Surgery

## 2017-08-31 VITALS — BP 127/78 | HR 91 | Resp 16 | Ht 72.0 in | Wt 204.0 lb

## 2017-08-31 DIAGNOSIS — I70213 Atherosclerosis of native arteries of extremities with intermittent claudication, bilateral legs: Secondary | ICD-10-CM

## 2017-08-31 DIAGNOSIS — M7989 Other specified soft tissue disorders: Secondary | ICD-10-CM | POA: Diagnosis not present

## 2017-08-31 DIAGNOSIS — I6523 Occlusion and stenosis of bilateral carotid arteries: Secondary | ICD-10-CM | POA: Diagnosis not present

## 2017-08-31 DIAGNOSIS — I10 Essential (primary) hypertension: Secondary | ICD-10-CM

## 2017-08-31 NOTE — Assessment & Plan Note (Signed)
Currently, this is his biggest complaint.  A venous reflux study of his left lower extremity will be performed at his convenience in the near future.  He should continue wearing compression stockings and trying to elevate his legs as well as maintaining good activity levels.

## 2017-08-31 NOTE — Assessment & Plan Note (Signed)
Carotid duplex today reveals stable 1 to 39% bilateral carotid artery stenosis. On appropriate medical therapy.  No intervention recommended currently for this mild disease.  Recheck in 1 year.

## 2017-08-31 NOTE — Assessment & Plan Note (Signed)
Significant improvement in his left lower extremity symptoms after revascularization.  His noninvasive studies today would indicate stable ABI on the right of 0.92 and a marked improvement in the left ABI now up to 0.98. Recheck in 3 to 4 months at this point.  Continue aspirin, Plavix, and Crestor

## 2017-08-31 NOTE — Patient Instructions (Signed)

## 2017-08-31 NOTE — Progress Notes (Signed)
MRN : [id number]  [name] [name] is a 57 y.o. (01-May-1960) male who presents with chief complaint of  Chief Complaint  Patient presents with  . Carotid    65yrfollow up  .  History of Present Illness: Patient returns today in follow up of multiple vascular issues.  About a month ago, he underwent left lower extremity revascularization which has resulted in significant improvement in the claudication symptoms in the left leg.  His noninvasive studies today would indicate stable ABI on the right of 0.92 and a marked improvement in the left ABI now up to 0.98. He is also studied with carotid duplex today due to his history of mild carotid disease.  He denies any current TIA or stroke symptoms. Specifically, the patient denies amaurosis fugax, speech or swallowing difficulties, or arm or leg weakness or numbness.  Carotid duplex today reveals stable 1 to 39% bilateral carotid artery stenosis. His biggest complaint today is of persistent left lower extremity swelling.  This did not really get any worse or any better with his recent angiogram, but remains problematic.  He has been wearing compression stockings daily.  There is no clear inciting event.  Elevation and compression have not helped this.  Current Outpatient Medications  Medication Sig Dispense Refill  . amLODipine (NORVASC) 10 MG tablet Take 10 mg by mouth daily.    .Marland Kitchenaspirin EC 81 MG tablet Take 81 mg by mouth daily.    . clopidogrel (PLAVIX) 75 MG tablet Take 75 mg by mouth daily.    . metoprolol (TOPROL-XL) 200 MG 24 hr tablet Take 200 mg by mouth daily.     . Multiple Vitamin (MULTIVITAMIN WITH MINERALS) TABS tablet Take 1 tablet by mouth daily.    .Marland Kitchenomega-3 acid ethyl esters (LOVAZA) 1 G capsule Take 4 g by mouth daily.     . pantoprazole (PROTONIX) 40 MG tablet Take 40 mg by mouth daily.    . rosuvastatin (CRESTOR) 40 MG tablet Take 40 mg by mouth daily.      No current facility-administered medications for this visit.       Past Medical History:  Diagnosis Date  . Anemia    pernicious anemia  . Chest pain    Unspecified  . Chronic kidney disease   . Dysrhythmia    pac's.  okay now.  .Marland KitchenGERD (gastroesophageal reflux disease)   . Hypertension   . Hypertriglyceridemia   . Kidney stone on left side    2018  . Palpitations   . Peripheral vascular disease [place]  . Pre-diabetes   . Thyroid nodule 2019   being monitored    Past Surgical History:  Procedure Laterality Date  . CYSTOSCOPY W/ RETROGRADES Bilateral 03/23/2017   Procedure: CYSTOSCOPY WITH RETROGRADE PYELOGRAM;  Surgeon: SAbbie Sons MD;  Location: [facility];  Service: Urology;  Laterality: Bilateral;  . CYSTOSCOPY W/ URETERAL STENT PLACEMENT Left 03/31/2017   Procedure: CYSTOSCOPY WITH STENT REPLACEMENT;  Surgeon: SAbbie Sons MD;  Location: [facility];  Service: Urology;  Laterality: Left;  . CYSTOSCOPY/RETROGRADE/URETEROSCOPY/STONE EXTRACTION WITH BASKET Left 03/31/2017   Procedure: CYSTOSCOPY/RETROGRADE/URETEROSCOPY/STONE EXTRACTION WITH BASKET;  Surgeon: SAbbie Sons MD;  Location: [facility];  Service: Urology;  Laterality: Left;  . CYSTOSCOPY/URETEROSCOPY/HOLMIUM LASER/STENT PLACEMENT Left 03/23/2017   Procedure: CYSTOSCOPY/URETEROSCOPY/HOLMIUM LASER/STENT PLACEMENT;  Surgeon: SAbbie Sons MD;  Location: [facility];  Service: Urology;  Laterality: Left;  . ESOPHAGOGASTRODUODENOSCOPY (EGD) WITH PROPOFOL N/A 10/22/2015   Procedure: ESOPHAGOGASTRODUODENOSCOPY (EGD) WITH  PROPOFOL;  Surgeon: Lucilla Lame, MD;  Location: [facility];  Service: Endoscopy;  Laterality: N/A;  . EXTRACORPOREAL SHOCK WAVE LITHOTRIPSY  2016  . EXTRACORPOREAL SHOCK WAVE LITHOTRIPSY Left 09/13/2014   Procedure: EXTRACORPOREAL SHOCK WAVE LITHOTRIPSY (ESWL);  Surgeon: Hollice Espy, MD;  Location: [facility];  Service: Urology;  Laterality: Left;  . LOWER EXTREMITY ANGIOGRAPHY Left 07/15/2017   Procedure: LOWER EXTREMITY ANGIOGRAPHY;  Surgeon: Algernon Huxley, MD;  Location: [place] [facility];  Service: Cardiovascular;  Laterality: Left;  . lower extremity interventions x2 right leg Right    stent  . PERIPHERAL VASCULAR CATHETERIZATION Right 08/20/2014   Procedure: Lower Extremity Angiography;  Surgeon: Algernon Huxley, MD;  Location: [place] [facility];  Service: Cardiovascular;  Laterality: Right;  . PERIPHERAL VASCULAR CATHETERIZATION Right 08/20/2014   Procedure: Lower Extremity Intervention;  Surgeon: Algernon Huxley, MD;  Location: [place] [facility];  Service: Cardiovascular;  Laterality: Right;    Social History Social History   Tobacco Use  . Smoking status: Former Smoker    Packs/day: 1.00    Years: 37.00    Pack years: 37.00    Last attempt to quit: 04/21/2013    Years since quitting: 4.3  . Smokeless tobacco: Never Used  Substance Use Topics  . Alcohol use: Yes    Alcohol/week: 8.4 oz    Types: 7 Glasses of wine, 7 Cans of beer per week    Comment: trying to cut back on daily consumption  . Drug use: No      Family History Family History  Problem Relation Age of Onset  . Heart failure Unknown   No bleeding disorders, clotting disorders, autoimmune diseases, or aneurysms  Allergies  Allergen Reactions  . Fenofibrate Other (See Comments)    Reaction: causes abdominal pain     REVIEW OF SYSTEMS (Negative unless checked)  Constitutional: _0 Weight loss  _1 Fever  _2 Chills Cardiac: _3 Chest pain   _4 Chest pressure   _5 Palpitations   _6 Shortness of breath when laying flat   _7 Shortness of breath at rest   _8 Shortness of breath with exertion. Vascular:  _9 Pain in legs with walking   _10 Pain in legs at rest   _11 Pain in legs when laying flat   _12 Claudication   _13 Pain in feet when walking  _14 Pain in feet at rest  _15 Pain in feet when laying flat   _16 History of DVT   _17 Phlebitis   _18 Swelling in legs   _19 Varicose veins   _20 Non-healing ulcers Pulmonary:   _21 Uses home oxygen   _22 Productive cough   _23 Hemoptysis   _24 Wheeze   _25 COPD   _26 Asthma Neurologic:  _27 Dizziness  _28 Blackouts   _29 Seizures   _30 History of stroke   _31 History of TIA  _32 Aphasia   _33 Temporary blindness   _34 Dysphagia   _35 Weakness or numbness in arms   _36 Weakness or numbness in legs Musculoskeletal:  _37 Arthritis   _38 Joint swelling   _39 Joint pain   _40 Low back pain Hematologic:  _41 Easy bruising  _42 Easy bleeding   _43 Hypercoagulable state   _44 Anemic   Gastrointestinal:  _45 Blood in stool   _46 Vomiting blood  _47 Gastroesophageal reflux/heartburn   _48 Abdominal pain Genitourinary:  _49 Chronic kidney disease   _50 Difficult urination  _51 Frequent urination  _52 Burning with urination   _53 Hematuria Skin:  _54 Rashes   _55 Ulcers   _56 Wounds Psychological:  _57 History of anxiety   _58  History of major depression.  Physical Examination  BP 127/78 (BP Location: Left Arm)   Pulse 91   Resp 16   Ht 6' (1.829  m)   Wt 204 lb (92.5 kg)   BMI 27.67 kg/m  Gen:  WD/WN, NAD Head: [place]/AT, No temporalis wasting. Ear/Nose/Throat: Hearing grossly intact, nares w/o erythema or drainage Eyes: Conjunctiva clear. Sclera non-icteric Neck: Supple.  Trachea midline Pulmonary:  Good air movement, no use of accessory muscles.  Cardiac: irregular Vascular:  Vessel Right Left  Radial Palpable Palpable                          PT 1+ Palpable 1+ Palpable  DP Palpable Palpable   Gastrointestinal: soft, non-tender/non-distended. Musculoskeletal: M/S 5/5 throughout.  No deformity or atrophy. 1+ LLE edema. Neurologic: Sensation grossly intact in extremities.  Symmetrical.  Speech is fluent.  Psychiatric: Judgment intact, Mood & affect appropriate for pt's clinical situation. Dermatologic: No rashes or ulcers noted.  No cellulitis or open wounds.       Labs Recent Results (from the past 2160 hour(s))  BUN     Status: None   Collection Time: 07/13/17 11:49 AM  Result Value Ref Range   BUN 15 6 - 20 mg/dL    Comment: Performed at [facility], [address]  [place], [place] [address]  Creatinine, serum     Status: None   Collection Time: 07/13/17 11:49 AM  Result Value Ref Range   Creatinine, Ser 0.77 0.61 - 1.24 mg/dL   GFR calc non Af Amer >60 >60 mL/min   GFR calc Af Amer >60 >60 mL/min    Comment: (NOTE) The eGFR has been calculated using the CKD EPI equation. This calculation has not been validated in all clinical situations. eGFR's persistently <60 mL/min signify possible Chronic Kidney Disease. Performed at [facility], [address], [place], [place] [address]     Radiology No results found.  Assessment/Plan  Swelling of limb Currently, this is his biggest complaint.  A venous reflux study of his left lower extremity will be performed at his convenience in the near future.  He should continue wearing compression stockings and trying to elevate his legs as well as maintaining good activity levels.  Atherosclerosis of native arteries of extremity with intermittent claudication (HCC) Significant improvement in his left lower extremity symptoms after revascularization.  His noninvasive studies today would indicate stable ABI on the right of 0.92 and a marked improvement in the left ABI now up to 0.98. Recheck in 3 to 4 months at this point.  Continue aspirin, Plavix, and Crestor  Benign essential HTN blood pressure control important in reducing the progression of atherosclerotic disease. On appropriate oral medications.   Carotid stenosis Carotid duplex today reveals stable 1 to 39% bilateral carotid artery stenosis. On appropriate medical therapy.  No intervention recommended currently for this mild disease.  Recheck in 1 year.    Leotis Pain, MD  08/31/2017 5:05 PM    This note was created with Dragon medical transcription system.  Any errors from dictation are purely unintentional

## 2017-08-31 NOTE — Assessment & Plan Note (Signed)
blood pressure control important in reducing the progression of atherosclerotic disease. On appropriate oral medications.  

## 2017-09-07 ENCOUNTER — Ambulatory Visit (INDEPENDENT_AMBULATORY_CARE_PROVIDER_SITE_OTHER): Payer: Managed Care, Other (non HMO) | Admitting: Vascular Surgery

## 2017-09-07 ENCOUNTER — Encounter (INDEPENDENT_AMBULATORY_CARE_PROVIDER_SITE_OTHER): Payer: Self-pay | Admitting: Vascular Surgery

## 2017-09-07 ENCOUNTER — Ambulatory Visit (INDEPENDENT_AMBULATORY_CARE_PROVIDER_SITE_OTHER): Payer: Managed Care, Other (non HMO)

## 2017-09-07 ENCOUNTER — Other Ambulatory Visit (INDEPENDENT_AMBULATORY_CARE_PROVIDER_SITE_OTHER): Payer: Self-pay

## 2017-09-07 ENCOUNTER — Encounter (INDEPENDENT_AMBULATORY_CARE_PROVIDER_SITE_OTHER): Payer: Managed Care, Other (non HMO)

## 2017-09-07 VITALS — BP 129/81 | HR 78 | Resp 16 | Ht 72.0 in | Wt 204.0 lb

## 2017-09-07 DIAGNOSIS — I872 Venous insufficiency (chronic) (peripheral): Secondary | ICD-10-CM | POA: Diagnosis not present

## 2017-09-07 DIAGNOSIS — I890 Lymphedema, not elsewhere classified: Secondary | ICD-10-CM

## 2017-09-07 DIAGNOSIS — R600 Localized edema: Secondary | ICD-10-CM | POA: Diagnosis not present

## 2017-09-07 DIAGNOSIS — M7989 Other specified soft tissue disorders: Secondary | ICD-10-CM

## 2017-09-07 NOTE — Progress Notes (Signed)
Subjective:    Patient ID: [name] [name], male    DOB: March 08, 1961, 57 y.o.   MRN: [id number] Chief Complaint  Patient presents with  . Follow-up    pt conv reflux   Patient presents to review vascular studies.  It was initially seen on April 20, 2017 for bilateral lower extremity edema and discomfort.  Since his initial visit the patient has been engaging in conservative therapy including wearing medical grade one compression socks, elevating his legs and remaining active with minimal improvement in his symptoms.  The patient's uncontrolled bilateral lower extremity edema and discomfort has not progressed to the point that he is unable to function on a daily basis and feels that his symptoms have become lifestyle limiting.  The patient underwent a left lower extremity venous reflux study which was notable for abnormal reflux times in the left common femoral and popliteal vein.  No evidence of deep vein or superficial thrombophlebitis.  The patient denies any rest pain or ulceration to the bilateral lower extremity.  Patient denies any fever, nausea vomiting.  Review of Systems  Constitutional: Negative.   HENT: Negative.   Eyes: Negative.   Respiratory: Negative.   Cardiovascular: Positive for leg swelling.       Chronic Venous Insufficiency Lymphedema  Gastrointestinal: Negative.   Endocrine: Negative.   Genitourinary: Negative.   Musculoskeletal: Negative.   Skin: Negative.   Allergic/Immunologic: Negative.   Neurological: Negative.   Hematological: Negative.   Psychiatric/Behavioral: Negative.       Objective:   Physical Exam  Constitutional: He is oriented to person, place, and time. He appears well-developed and well-nourished. No distress.  HENT:  Head: Normocephalic and atraumatic.  Right Ear: External ear normal.  Left Ear: External ear normal.  Eyes: Pupils are equal, round, and reactive to light. Conjunctivae and EOM are normal.  Neck: Normal range of motion.    Cardiovascular: Normal rate, regular rhythm, normal heart sounds and intact distal pulses.  Pulses:      Radial pulses are 2+ on the right side, and 2+ on the left side.       Dorsalis pedis pulses are 1+ on the right side, and 1+ on the left side.       Posterior tibial pulses are 1+ on the right side, and 1+ on the left side.  Pulmonary/Chest: Effort normal and breath sounds normal.  Musculoskeletal: Normal range of motion. He exhibits edema (Mild to moderate nonpitting edema).  Neurological: He is alert and oriented to person, place, and time.  Skin: Skin is warm and dry. He is not diaphoretic.  Psychiatric: He has a normal mood and affect. His behavior is normal. Judgment and thought content normal.  Vitals reviewed.  BP 129/81 (BP Location: Right Arm)   Pulse 78   Resp 16   Ht 6' (1.829 m)   Wt 204 lb (92.5 kg)   BMI 27.67 kg/m   Past Medical History:  Diagnosis Date  . Anemia    pernicious anemia  . Chest pain    Unspecified  . Chronic kidney disease   . Dysrhythmia    pac's.  okay now.  Marland Kitchen GERD (gastroesophageal reflux disease)   . Hypertension   . Hypertriglyceridemia   . Kidney stone on left side    2018  . Palpitations   . Peripheral vascular disease ([place]) 2019  . Pre-diabetes   . Thyroid nodule 2019   being monitored   Social History   Socioeconomic History  .  Marital status: Married    Spouse name: Not on file  . Number of children: Not on file  . Years of education: Not on file  . Highest education level: Not on file  Occupational History  . Occupation: Full Time  Social Needs  . Financial resource strain: Not on file  . Food insecurity:    Worry: Not on file    Inability: Not on file  . Transportation needs:    Medical: Not on file    Non-medical: Not on file  Tobacco Use  . Smoking status: Former Smoker    Packs/day: 1.00    Years: 37.00    Pack years: 37.00    Last attempt to quit: 04/21/2013    Years since quitting: 4.3  . Smokeless  tobacco: Never Used  Substance and Sexual Activity  . Alcohol use: Yes    Alcohol/week: 8.4 oz    Types: 7 Glasses of wine, 7 Cans of beer per week    Comment: trying to cut back on daily consumption  . Drug use: No  . Sexual activity: Not on file  Lifestyle  . Physical activity:    Days per week: Not on file    Minutes per session: Not on file  . Stress: Not on file  Relationships  . Social connections:    Talks on phone: Not on file    Gets together: Not on file    Attends religious service: Not on file    Active member of club or organization: Not on file    Attends meetings of clubs or organizations: Not on file    Relationship status: Not on file  . Intimate partner violence:    Fear of current or ex partner: Not on file    Emotionally abused: Not on file    Physically abused: Not on file    Forced sexual activity: Not on file  Other Topics Concern  . Not on file  Social History Narrative   Regular exercise: No   Past Surgical History:  Procedure Laterality Date  . CYSTOSCOPY W/ RETROGRADES Bilateral 03/23/2017   Procedure: CYSTOSCOPY WITH RETROGRADE PYELOGRAM;  Surgeon: Abbie Sons, MD;  Location: [facility];  Service: Urology;  Laterality: Bilateral;  . CYSTOSCOPY W/ URETERAL STENT PLACEMENT Left 03/31/2017   Procedure: CYSTOSCOPY WITH STENT REPLACEMENT;  Surgeon: Abbie Sons, MD;  Location: [facility];  Service: Urology;  Laterality: Left;  . CYSTOSCOPY/RETROGRADE/URETEROSCOPY/STONE EXTRACTION WITH BASKET Left 03/31/2017   Procedure: CYSTOSCOPY/RETROGRADE/URETEROSCOPY/STONE EXTRACTION WITH BASKET;  Surgeon: Abbie Sons, MD;  Location: [facility];  Service: Urology;  Laterality: Left;  . CYSTOSCOPY/URETEROSCOPY/HOLMIUM LASER/STENT PLACEMENT Left 03/23/2017   Procedure: CYSTOSCOPY/URETEROSCOPY/HOLMIUM LASER/STENT PLACEMENT;  Surgeon: Abbie Sons, MD;  Location: [facility];  Service: Urology;  Laterality: Left;  . ESOPHAGOGASTRODUODENOSCOPY (EGD) WITH  PROPOFOL N/A 10/22/2015   Procedure: ESOPHAGOGASTRODUODENOSCOPY (EGD) WITH PROPOFOL;  Surgeon: Lucilla Lame, MD;  Location: [facility];  Service: Endoscopy;  Laterality: N/A;  . EXTRACORPOREAL SHOCK WAVE LITHOTRIPSY  2016  . EXTRACORPOREAL SHOCK WAVE LITHOTRIPSY Left 09/13/2014   Procedure: EXTRACORPOREAL SHOCK WAVE LITHOTRIPSY (ESWL);  Surgeon: Hollice Espy, MD;  Location: [facility];  Service: Urology;  Laterality: Left;  . LOWER EXTREMITY ANGIOGRAPHY Left 07/15/2017   Procedure: LOWER EXTREMITY ANGIOGRAPHY;  Surgeon: Algernon Huxley, MD;  Location: [place] [facility];  Service: Cardiovascular;  Laterality: Left;  . lower extremity interventions x2 right leg Right    stent  . PERIPHERAL VASCULAR CATHETERIZATION Right 08/20/2014   Procedure: Lower  Extremity Angiography;  Surgeon: Algernon Huxley, MD;  Location: [place] [facility];  Service: Cardiovascular;  Laterality: Right;  . PERIPHERAL VASCULAR CATHETERIZATION Right 08/20/2014   Procedure: Lower Extremity Intervention;  Surgeon: Algernon Huxley, MD;  Location: [place] [facility];  Service: Cardiovascular;  Laterality: Right;   Family History  Problem Relation Age of Onset  . Heart failure Unknown    Allergies  Allergen Reactions  . Fenofibrate Other (See Comments)    Reaction: causes abdominal pain      Assessment & Plan:  Patient presents to review vascular studies.  It was initially seen on April 20, 2017 for bilateral lower extremity edema and discomfort.  Since his initial visit the patient has been engaging in conservative therapy including wearing medical grade one compression socks, elevating his legs and remaining active with minimal improvement in his symptoms.  The patient's uncontrolled bilateral lower extremity edema and discomfort has not progressed to the point that he is unable to function on a daily basis and feels that his symptoms have become lifestyle limiting.  The patient underwent a left lower extremity venous reflux  study which was notable for abnormal reflux times in the left common femoral and popliteal vein.  No evidence of deep vein or superficial thrombophlebitis.  The patient denies any rest pain or ulceration to the bilateral lower extremity.  Patient denies any fever, nausea vomiting.  1. Chronic venous insufficiency - New Patient was found to have chronic venous insufficiency in the left common femoral and popliteal vein. Due to the location of the patient's venous insufficiency he is not a candidate for endovenous laser ablation or sclerotherapy. The patient is to continue engaging in conservative therapy including wearing medical grade 1 compression socks elevating his legs and remaining active while apply for lymphedema pump as an added therapy. The patient would benefit from the added therapy of a lymphedema pump. I will apply to his insurance. I will see the patient back in 6 months to assess his progress with conservative therapy and the addition of lymphedema pump.  2. Lymphedema - New Despite conservative treatments including exercise, elevation and class I compression stockings the patient still presents with stage I lymphedema. The patient would greatly benefit from the added therapy of a lymphedema pump I will apply to his insurance. I will see the patient back in 6 months to assess his progress with conservative therapy and the addition of lymphedema pump.  Current Outpatient Medications on File Prior to Visit  Medication Sig Dispense Refill  . amLODipine (NORVASC) 10 MG tablet Take 10 mg by mouth daily.    Marland Kitchen aspirin EC 81 MG tablet Take 81 mg by mouth daily.    . clopidogrel (PLAVIX) 75 MG tablet Take 75 mg by mouth daily.    . metoprolol (TOPROL-XL) 200 MG 24 hr tablet Take 200 mg by mouth daily.     . Multiple Vitamin (MULTIVITAMIN WITH MINERALS) TABS tablet Take 1 tablet by mouth daily.    Marland Kitchen omega-3 acid ethyl esters (LOVAZA) 1 G capsule Take 4 g by mouth daily.     .  pantoprazole (PROTONIX) 40 MG tablet Take 40 mg by mouth daily.    . rosuvastatin (CRESTOR) 40 MG tablet Take 40 mg by mouth daily.      No current facility-administered medications on file prior to visit.    There are no Patient Instructions on file for this visit. No follow-ups on file.  Lyndsi Altic A Dawson Hollman, PA-C

## 2017-10-28 ENCOUNTER — Ambulatory Visit (INDEPENDENT_AMBULATORY_CARE_PROVIDER_SITE_OTHER): Payer: Managed Care, Other (non HMO) | Admitting: Urology

## 2017-10-28 ENCOUNTER — Encounter: Payer: Self-pay | Admitting: Urology

## 2017-10-28 VITALS — BP 128/69 | HR 89 | Ht 72.0 in | Wt 210.4 lb

## 2017-10-28 DIAGNOSIS — N200 Calculus of kidney: Secondary | ICD-10-CM | POA: Diagnosis not present

## 2017-10-28 NOTE — Progress Notes (Signed)
10/28/2017 2:47 PM   [name] [name] 1961-03-03 [id number]  Referring provider: Kirk Ruths, MD [facility] [place], [place] [address]  Chief Complaint  Patient presents with  . Nephrolithiasis    HPI: 57 year old male presents for follow-up of nephrolithiasis.  He underwent staged ureteroscopy in December 2018 for left renal/ureteral calculi.  Stone analysis was 100% uric acid.  He declined a metabolic evaluation.  He is taking sodium citrate.  In the past 6 months he has done well and denies flank or abdominal pain.  He has no voiding symptoms and denies gross hematuria.  He did not give a urine specimen today.   PMH: Past Medical History:  Diagnosis Date  . Anemia    pernicious anemia  . Chest pain    Unspecified  . Chronic kidney disease   . Dysrhythmia    pac's.  okay now.  Marland Kitchen GERD (gastroesophageal reflux disease)   . Hypertension   . Hypertriglyceridemia   . Kidney stone on left side    2018  . Palpitations   . Peripheral vascular disease ([place]) 2019  . Pre-diabetes   . Thyroid nodule 2019   being monitored    Surgical History: Past Surgical History:  Procedure Laterality Date  . CYSTOSCOPY W/ RETROGRADES Bilateral 03/23/2017   Procedure: CYSTOSCOPY WITH RETROGRADE PYELOGRAM;  Surgeon: Abbie Sons, MD;  Location: [facility];  Service: Urology;  Laterality: Bilateral;  . CYSTOSCOPY W/ URETERAL STENT PLACEMENT Left 03/31/2017   Procedure: CYSTOSCOPY WITH STENT REPLACEMENT;  Surgeon: Abbie Sons, MD;  Location: [facility];  Service: Urology;  Laterality: Left;  . CYSTOSCOPY/RETROGRADE/URETEROSCOPY/STONE EXTRACTION WITH BASKET Left 03/31/2017   Procedure: CYSTOSCOPY/RETROGRADE/URETEROSCOPY/STONE EXTRACTION WITH BASKET;  Surgeon: Abbie Sons, MD;  Location: [facility];  Service: Urology;  Laterality: Left;  . CYSTOSCOPY/URETEROSCOPY/HOLMIUM LASER/STENT PLACEMENT Left 03/23/2017   Procedure:  CYSTOSCOPY/URETEROSCOPY/HOLMIUM LASER/STENT PLACEMENT;  Surgeon: Abbie Sons, MD;  Location: [facility];  Service: Urology;  Laterality: Left;  . ESOPHAGOGASTRODUODENOSCOPY (EGD) WITH PROPOFOL N/A 10/22/2015   Procedure: ESOPHAGOGASTRODUODENOSCOPY (EGD) WITH PROPOFOL;  Surgeon: Lucilla Lame, MD;  Location: [facility];  Service: Endoscopy;  Laterality: N/A;  . EXTRACORPOREAL SHOCK WAVE LITHOTRIPSY  2016  . EXTRACORPOREAL SHOCK WAVE LITHOTRIPSY Left 09/13/2014   Procedure: EXTRACORPOREAL SHOCK WAVE LITHOTRIPSY (ESWL);  Surgeon: Hollice Espy, MD;  Location: [facility];  Service: Urology;  Laterality: Left;  . LOWER EXTREMITY ANGIOGRAPHY Left 07/15/2017   Procedure: LOWER EXTREMITY ANGIOGRAPHY;  Surgeon: Algernon Huxley, MD;  Location: [place] [facility];  Service: Cardiovascular;  Laterality: Left;  . lower extremity interventions x2 right leg Right    stent  . PERIPHERAL VASCULAR CATHETERIZATION Right 08/20/2014   Procedure: Lower Extremity Angiography;  Surgeon: Algernon Huxley, MD;  Location: [place] [facility];  Service: Cardiovascular;  Laterality: Right;  . PERIPHERAL VASCULAR CATHETERIZATION Right 08/20/2014   Procedure: Lower Extremity Intervention;  Surgeon: Algernon Huxley, MD;  Location: [place] [facility];  Service: Cardiovascular;  Laterality: Right;    Home Medications:  Allergies as of 10/28/2017      Reactions   Fenofibrate Other (See Comments)   Reaction: causes abdominal pain      Medication List        Accurate as of 10/28/17  2:47 PM. Always use your most recent med list.          amLODipine 10 MG tablet Commonly known as:  NORVASC Take 10 mg by mouth daily.  aspirin EC 81 MG tablet Take 81 mg by mouth daily.   clopidogrel 75 MG tablet Commonly known as:  PLAVIX Take 75 mg by mouth daily.   metoprolol 200 MG 24 hr tablet Commonly known as:  TOPROL-XL Take 200 mg by mouth daily.   multivitamin with minerals Tabs tablet Take 1 tablet by mouth daily.   omega-3  acid ethyl esters 1 g capsule Commonly known as:  LOVAZA Take 4 g by mouth daily.   pantoprazole 40 MG tablet Commonly known as:  PROTONIX Take 40 mg by mouth daily.   rosuvastatin 40 MG tablet Commonly known as:  CRESTOR Take 40 mg by mouth daily.       Allergies:  Allergies  Allergen Reactions  . Fenofibrate Other (See Comments)    Reaction: causes abdominal pain    Family History: Family History  Problem Relation Age of Onset  . Heart failure Unknown     Social History:  reports that he quit smoking about 4 years ago. He has a 37.00 pack-year smoking history. He has never used smokeless tobacco. He reports that he drinks about 8.4 oz of alcohol per week. He reports that he does not use drugs.  ROS: UROLOGY Frequent Urination?: No Hard to postpone urination?: No Burning/pain with urination?: No Get up at night to urinate?: No Leakage of urine?: No Urine stream starts and stops?: No Trouble starting stream?: No Do you have to strain to urinate?: No Blood in urine?: No Urinary tract infection?: No Sexually transmitted disease?: No Injury to kidneys or bladder?: No Painful intercourse?: No Weak stream?: No Erection problems?: No Penile pain?: No  Gastrointestinal Nausea?: No Vomiting?: No Indigestion/heartburn?: No Diarrhea?: No Constipation?: No  Constitutional Fever: No Night sweats?: No Weight loss?: No Fatigue?: No  Skin Skin rash/lesions?: No Itching?: No  Eyes Blurred vision?: No Double vision?: No  Ears/Nose/Throat Sore throat?: No Sinus problems?: No  Hematologic/Lymphatic Swollen glands?: No Easy bruising?: No  Cardiovascular Leg swelling?: Yes Chest pain?: No  Respiratory Cough?: No Shortness of breath?: No  Endocrine Excessive thirst?: No  Musculoskeletal Back pain?: No Joint pain?: No  Neurological Headaches?: No Dizziness?: No  Psychologic Depression?: No Anxiety?: No  Physical Exam: BP 128/69 (BP  Location: Left Arm, Patient Position: Sitting, Cuff Size: Large)   Pulse 89   Ht 6' (1.829 m)   Wt 210 lb 6.4 oz (95.4 kg)   BMI 28.54 kg/m   Constitutional:  Alert and oriented, No acute distress. HEENT: [place] AT, moist mucus membranes.  Trachea midline, no masses. Cardiovascular: No clubbing, cyanosis, or edema. Respiratory: Normal respiratory effort, no increased work of breathing. GI: Abdomen is soft, nontender, nondistended, no abdominal masses GU: No CVA tenderness Lymph: No cervical or inguinal lymphadenopathy. Skin: No rashes, bruises or suspicious lesions. Neurologic: Grossly intact, no focal deficits, moving all 4 extremities. Psychiatric: Normal mood and affect.   Assessment & Plan:   57 year old male with history of uric acid stone disease.  Will schedule a follow-up urinalysis.  He has an appointment with Dr. Ouida Sills in the near future and was given an order to have a complete urinalysis performed.  He will be notified with KUB results and follow-up recommendations.   Abbie Sons, [place] [address], [place], [place] [address] [phone]

## 2017-10-30 LAB — URINALYSIS, COMPLETE
Bilirubin, UA: NEGATIVE
Glucose, UA: NEGATIVE
Ketones, UA: NEGATIVE
LEUKOCYTES UA: NEGATIVE
NITRITE UA: NEGATIVE
PH UA: 5.5 (ref 5.0–7.5)
RBC UA: NEGATIVE
Specific Gravity, UA: 1.022 (ref 1.005–1.030)
UUROB: 0.2 mg/dL (ref 0.2–1.0)

## 2017-10-30 LAB — MICROSCOPIC EXAMINATION
Bacteria, UA: NONE SEEN
CASTS: NONE SEEN /LPF

## 2017-11-04 ENCOUNTER — Ambulatory Visit
Admission: RE | Admit: 2017-11-04 | Discharge: 2017-11-04 | Disposition: A | Discharge status: Home / Self Care | Payer: Managed Care, Other (non HMO) | Source: Ambulatory Visit | Attending: Urology | Admitting: Urology

## 2017-11-04 DIAGNOSIS — N200 Calculus of kidney: Secondary | ICD-10-CM

## 2017-11-04 DIAGNOSIS — R9389 Abnormal findings on diagnostic imaging of other specified body structures: Secondary | ICD-10-CM | POA: Diagnosis not present

## 2017-11-04 DIAGNOSIS — N281 Cyst of kidney, acquired: Secondary | ICD-10-CM | POA: Insufficient documentation

## 2017-11-05 ENCOUNTER — Telehealth: Payer: Self-pay

## 2017-11-05 NOTE — Telephone Encounter (Signed)
Left detailed message.   

## 2017-11-05 NOTE — Telephone Encounter (Signed)
-----   Message from Abbie Sons, MD sent at 11/04/2017  8:35 PM EDT ----- Renal ultrasound showed no calculi or kidney blockage

## 2017-11-25 ENCOUNTER — Ambulatory Visit: Payer: Managed Care, Other (non HMO) | Admitting: Urology

## 2017-12-28 ENCOUNTER — Ambulatory Visit (INDEPENDENT_AMBULATORY_CARE_PROVIDER_SITE_OTHER): Payer: Managed Care, Other (non HMO)

## 2017-12-28 ENCOUNTER — Ambulatory Visit (INDEPENDENT_AMBULATORY_CARE_PROVIDER_SITE_OTHER): Payer: Managed Care, Other (non HMO) | Admitting: Vascular Surgery

## 2017-12-28 ENCOUNTER — Encounter (INDEPENDENT_AMBULATORY_CARE_PROVIDER_SITE_OTHER): Payer: Self-pay | Admitting: Vascular Surgery

## 2017-12-28 VITALS — BP 124/75 | HR 82 | Resp 16 | Ht 70.0 in | Wt 207.0 lb

## 2017-12-28 DIAGNOSIS — R600 Localized edema: Secondary | ICD-10-CM

## 2017-12-28 DIAGNOSIS — I10 Essential (primary) hypertension: Secondary | ICD-10-CM | POA: Diagnosis not present

## 2017-12-28 DIAGNOSIS — I6523 Occlusion and stenosis of bilateral carotid arteries: Secondary | ICD-10-CM

## 2017-12-28 DIAGNOSIS — I890 Lymphedema, not elsewhere classified: Secondary | ICD-10-CM | POA: Diagnosis not present

## 2017-12-28 DIAGNOSIS — I70213 Atherosclerosis of native arteries of extremities with intermittent claudication, bilateral legs: Secondary | ICD-10-CM

## 2017-12-28 DIAGNOSIS — M7989 Other specified soft tissue disorders: Secondary | ICD-10-CM

## 2017-12-28 DIAGNOSIS — Z87891 Personal history of nicotine dependence: Secondary | ICD-10-CM

## 2017-12-28 NOTE — Assessment & Plan Note (Signed)
Symptoms much better after intervention.  His ABIs today are 1.01 on the right and 1.06 on the left with good strong biphasic waveforms.  Continue current medical regimen including aspirin, Plavix, and Crestor.  Recheck in 6 months.

## 2017-12-28 NOTE — Assessment & Plan Note (Signed)
Stable.  Does have a lymphedema pump and has been wearing compression stockings.  Appears a little bit better today although he says his gout is causing some of the issues as well.  Continue current conservative therapies.

## 2017-12-28 NOTE — Progress Notes (Signed)
MRN : [id number]  [name] [name] is a 57 y.o. (May 18, 1960) male who presents with chief complaint of  Chief Complaint  Patient presents with  . Follow-up    3-45month abi  .  History of Present Illness: Patient returns today in follow up of PAD and leg swelling with lymphedema overall, he is doing fairly well.  He reports that the gout seems to be causing as much issues with pain and swelling in his feet at this time as anything else.  He does have a lymphedema pump and has been wearing compression stockings regularly.  His claudication symptoms are much improved after intervention and are currently fairly mild. Symptoms much better after intervention.  His ABIs today are 1.01 on the right and 1.06 on the left with good strong biphasic waveforms.  Current Outpatient Medications  Medication Sig Dispense Refill  . amLODipine (NORVASC) 10 MG tablet Take 10 mg by mouth daily.    Marland Kitchen aspirin EC 81 MG tablet Take 81 mg by mouth daily.    . clopidogrel (PLAVIX) 75 MG tablet Take 75 mg by mouth daily.    . metoprolol (TOPROL-XL) 200 MG 24 hr tablet Take 200 mg by mouth daily.     . Multiple Vitamin (MULTIVITAMIN WITH MINERALS) TABS tablet Take 1 tablet by mouth daily.    Marland Kitchen omega-3 acid ethyl esters (LOVAZA) 1 G capsule Take 4 g by mouth daily.     . pantoprazole (PROTONIX) 40 MG tablet Take 40 mg by mouth daily.    . rosuvastatin (CRESTOR) 40 MG tablet Take 40 mg by mouth daily.      No current facility-administered medications for this visit.     Past Medical History:  Diagnosis Date  . Anemia    pernicious anemia  . Chest pain    Unspecified  . Chronic kidney disease   . Dysrhythmia    pac's.  okay now.  Marland Kitchen GERD (gastroesophageal reflux disease)   . Hypertension   . Hypertriglyceridemia   . Kidney stone on left side    2018  . Palpitations   . Peripheral vascular disease ([place]) 2019  . Pre-diabetes   . Thyroid nodule 2019   being monitored    Past Surgical History:    Procedure Laterality Date  . CYSTOSCOPY W/ RETROGRADES Bilateral 03/23/2017   Procedure: CYSTOSCOPY WITH RETROGRADE PYELOGRAM;  Surgeon: Abbie Sons, MD;  Location: [facility];  Service: Urology;  Laterality: Bilateral;  . CYSTOSCOPY W/ URETERAL STENT PLACEMENT Left 03/31/2017   Procedure: CYSTOSCOPY WITH STENT REPLACEMENT;  Surgeon: Abbie Sons, MD;  Location: [facility];  Service: Urology;  Laterality: Left;  . CYSTOSCOPY/RETROGRADE/URETEROSCOPY/STONE EXTRACTION WITH BASKET Left 03/31/2017   Procedure: CYSTOSCOPY/RETROGRADE/URETEROSCOPY/STONE EXTRACTION WITH BASKET;  Surgeon: Abbie Sons, MD;  Location: [facility];  Service: Urology;  Laterality: Left;  . CYSTOSCOPY/URETEROSCOPY/HOLMIUM LASER/STENT PLACEMENT Left 03/23/2017   Procedure: CYSTOSCOPY/URETEROSCOPY/HOLMIUM LASER/STENT PLACEMENT;  Surgeon: Abbie Sons, MD;  Location: [facility];  Service: Urology;  Laterality: Left;  . ESOPHAGOGASTRODUODENOSCOPY (EGD) WITH PROPOFOL N/A 10/22/2015   Procedure: ESOPHAGOGASTRODUODENOSCOPY (EGD) WITH PROPOFOL;  Surgeon: Lucilla Lame, MD;  Location: [facility];  Service: Endoscopy;  Laterality: N/A;  . EXTRACORPOREAL SHOCK WAVE LITHOTRIPSY  2016  . EXTRACORPOREAL SHOCK WAVE LITHOTRIPSY Left 09/13/2014   Procedure: EXTRACORPOREAL SHOCK WAVE LITHOTRIPSY (ESWL);  Surgeon: Hollice Espy, MD;  Location: [facility];  Service: Urology;  Laterality: Left;  . LOWER EXTREMITY ANGIOGRAPHY Left 07/15/2017   Procedure: LOWER EXTREMITY ANGIOGRAPHY;  Surgeon: Leotis Pain  S, MD;  Location: [place] [facility];  Service: Cardiovascular;  Laterality: Left;  . lower extremity interventions x2 right leg Right    stent  . PERIPHERAL VASCULAR CATHETERIZATION Right 08/20/2014   Procedure: Lower Extremity Angiography;  Surgeon: Algernon Huxley, MD;  Location: [place] [facility];  Service: Cardiovascular;  Laterality: Right;  . PERIPHERAL VASCULAR CATHETERIZATION Right 08/20/2014   Procedure: Lower Extremity  Intervention;  Surgeon: Algernon Huxley, MD;  Location: [place] [facility];  Service: Cardiovascular;  Laterality: Right;    Social History        Tobacco Use  . Smoking status: Former Smoker    Packs/day: 1.00    Years: 37.00    Pack years: 37.00    Last attempt to quit: 04/21/2013    Years since quitting: 4.3  . Smokeless tobacco: Never Used  Substance Use Topics  . Alcohol use: Yes    Alcohol/week: 8.4 oz    Types: 7 Glasses of wine, 7 Cans of beer per week    Comment: trying to cut back on daily consumption  . Drug use: No      Family History      Family History  Problem Relation Age of Onset  . Heart failure Unknown   No bleeding disorders, clotting disorders, autoimmune diseases, or aneurysms       Allergies  Allergen Reactions  . Fenofibrate Other (See Comments)    Reaction: causes abdominal pain     REVIEW OF SYSTEMS (Negative unless checked)  Constitutional: [] Weight loss  [] Fever  [] Chills Cardiac: [] Chest pain   [] Chest pressure   [] Palpitations   [] Shortness of breath when laying flat   [] Shortness of breath at rest   [] Shortness of breath with exertion. Vascular:  [x] Pain in legs with walking   [] Pain in legs at rest   [] Pain in legs when laying flat   [] Claudication   [] Pain in feet when walking  [] Pain in feet at rest  [] Pain in feet when laying flat   [] History of DVT   [] Phlebitis   [x] Swelling in legs   [] Varicose veins   [] Non-healing ulcers Pulmonary:   [] Uses home oxygen   [] Productive cough   [] Hemoptysis   [] Wheeze  [] COPD   [] Asthma Neurologic:  [] Dizziness  [] Blackouts   [] Seizures   [] History of stroke   [] History of TIA  [] Aphasia   [] Temporary blindness   [] Dysphagia   [] Weakness or numbness in arms   [] Weakness or numbness in legs Musculoskeletal:  [x] Arthritis   [] Joint swelling   [] Joint pain   [] Low back pain Hematologic:  [] Easy bruising  [] Easy bleeding   [] Hypercoagulable state   [x] Anemic   Gastrointestinal:   [] Blood in stool   [] Vomiting blood  [x] Gastroesophageal reflux/heartburn   [] Abdominal pain Genitourinary:  [x] Chronic kidney disease   [] Difficult urination  [] Frequent urination  [] Burning with urination   [] Hematuria Skin:  [] Rashes   [] Ulcers   [] Wounds Psychological:  [] History of anxiety   []  History of major depression.    Physical Examination  BP 124/75 (BP Location: Right Arm)   Pulse 82   Resp 16   Ht 5\' 10"  (1.778 m)   Wt 207 lb (93.9 kg)   BMI 29.70 kg/m  Gen:  WD/WN, NAD Head: [place]/AT, No temporalis wasting. Ear/Nose/Throat: Hearing grossly intact, nares w/o erythema or drainage Eyes: Conjunctiva clear. Sclera non-icteric Neck: Supple.  Trachea midline Pulmonary:  Good air movement, no use of accessory muscles.  Cardiac: RRR, no  JVD Vascular:  Vessel Right Left  Radial Palpable Palpable                          PT Palpable 1+ Palpable  DP Palpable Palpable    Musculoskeletal: M/S 5/5 throughout.  No deformity or atrophy. Mild LLE edema. Neurologic: Sensation grossly intact in extremities.  Symmetrical.  Speech is fluent.  Psychiatric: Judgment intact, Mood & affect appropriate for pt's clinical situation. Dermatologic: No rashes or ulcers noted.  No cellulitis or open wounds.       Labs Recent Results (from the past 2160 hour(s))  Urinalysis, Complete     Status: None   Collection Time: 10/29/17 10:35 AM  Result Value Ref Range   Specific Gravity, UA 1.022 1.005 - 1.030   pH, UA 5.5 5.0 - 7.5   Color, UA Yellow Yellow   Appearance Ur Clear Clear   Leukocytes, UA Negative Negative   Protein, UA Trace Negative/Trace   Glucose, UA Negative Negative   Ketones, UA Negative Negative   RBC, UA Negative Negative   Bilirubin, UA Negative Negative   Urobilinogen, Ur 0.2 0.2 - 1.0 mg/dL   Nitrite, UA Negative Negative   Microscopic Examination Comment     Comment: Microscopic follows if indicated.   Microscopic Examination See below:     Comment:  Microscopic was indicated and was performed.  Microscopic Examination     Status: None   Collection Time: 10/29/17 10:35 AM  Result Value Ref Range   WBC, UA 0-5 0 - 5 /hpf   RBC, UA 0-2 0 - 2 /hpf   Epithelial Cells (non renal) 0-10 0 - 10 /hpf   Casts None seen None seen /lpf   Mucus, UA Present Not Estab.   Bacteria, UA None seen None seen/Few    Radiology No results found.  Assessment/Plan Benign essential HTN blood pressure control important in reducing the progression of atherosclerotic disease. On appropriate oral medications.   Carotid stenosis Carotid duplex earlier this year reveals stable 1 to 39% bilateral carotid artery stenosis. On appropriate medical therapy.  No intervention recommended currently for this mild disease.  Recheck in 1 year.  Swelling of limb Stable.  Does have a lymphedema pump and has been wearing compression stockings.  Appears a little bit better today although he says his gout is causing some of the issues as well.  Continue current conservative therapies.  Atherosclerosis of native arteries of extremity with intermittent claudication (HCC) Symptoms much better after intervention.  His ABIs today are 1.01 on the right and 1.06 on the left with good strong biphasic waveforms.  Continue current medical regimen including aspirin, Plavix, and Crestor.  Recheck in 6 months.    Leotis Pain, MD  12/28/2017 11:59 AM    This note was created with Dragon medical transcription system.  Any errors from dictation are purely unintentional

## 2018-05-17 ENCOUNTER — Telehealth: Payer: Self-pay | Admitting: *Deleted

## 2018-05-17 DIAGNOSIS — Z87891 Personal history of nicotine dependence: Secondary | ICD-10-CM

## 2018-05-17 DIAGNOSIS — Z122 Encounter for screening for malignant neoplasm of respiratory organs: Secondary | ICD-10-CM

## 2018-05-17 NOTE — Telephone Encounter (Signed)
Received referral for initial lung cancer screening scan. Contacted patient and obtained smoking history,(former, quit 2015, 37 pack year) as well as answering questions related to screening process. Patient denies signs of lung cancer such as weight loss or hemoptysis. Patient denies comorbidity that would prevent curative treatment if lung cancer were found. Patient is scheduled for shared decision making visit and CT scan on 06/09/18 at 130pm.

## 2018-06-08 ENCOUNTER — Telehealth: Payer: Self-pay | Admitting: *Deleted

## 2018-06-08 NOTE — Telephone Encounter (Signed)
CALLED PT TO REMIND HIM OF HIS APPT FOR LDCT SCREENING ON 06-09-2018@1330 , VOICED UNDERSTANDING.

## 2018-06-09 ENCOUNTER — Other Ambulatory Visit: Payer: Self-pay

## 2018-06-09 ENCOUNTER — Ambulatory Visit
Admission: RE | Admit: 2018-06-09 | Discharge: 2018-06-09 | Disposition: A | Discharge status: Home / Self Care | Payer: Managed Care, Other (non HMO) | Source: Ambulatory Visit | Attending: Nurse Practitioner | Admitting: Nurse Practitioner

## 2018-06-09 ENCOUNTER — Inpatient Hospital Stay: Payer: Managed Care, Other (non HMO) | Attending: Nurse Practitioner | Admitting: Oncology

## 2018-06-09 DIAGNOSIS — Z87891 Personal history of nicotine dependence: Secondary | ICD-10-CM

## 2018-06-09 DIAGNOSIS — Z122 Encounter for screening for malignant neoplasm of respiratory organs: Secondary | ICD-10-CM | POA: Insufficient documentation

## 2018-06-09 NOTE — Progress Notes (Signed)
In accordance with CMS guidelines, patient has met eligibility criteria including age, absence of signs or symptoms of lung cancer.  Social History   Tobacco Use  . Smoking status: Former Smoker    Packs/day: 1.00    Years: 37.00    Pack years: 37.00    Last attempt to quit: 04/21/2013    Years since quitting: 5.1  . Smokeless tobacco: Never Used  Substance Use Topics  . Alcohol use: Yes    Alcohol/week: 14.0 standard drinks    Types: 7 Glasses of wine, 7 Cans of beer per week    Comment: trying to cut back on daily consumption  . Drug use: No     A shared decision-making session was conducted prior to the performance of CT scan. This includes one or more decision aids, includes benefits and harms of screening, follow-up diagnostic testing, over-diagnosis, false positive rate, and total radiation exposure.  Counseling on the importance of adherence to annual lung cancer LDCT screening, impact of co-morbidities, and ability or willingness to undergo diagnosis and treatment is imperative for compliance of the program.  Counseling on the importance of continued smoking cessation for former smokers; the importance of smoking cessation for current smokers, and information about tobacco cessation interventions have been given to patient including [place] and [facility] quit [place] programs.  Written order for lung cancer screening with LDCT has been given to the patient and any and all questions have been answered to the best of my abilities.   Yearly follow up will be coordinated by Burgess Estelle, Thoracic Navigator.  Faythe Casa, NP 06/09/2018 2:18 PM

## 2018-06-10 ENCOUNTER — Encounter: Payer: Self-pay | Admitting: *Deleted

## 2018-06-10 ENCOUNTER — Telehealth: Payer: Self-pay | Admitting: *Deleted

## 2018-06-10 NOTE — Telephone Encounter (Signed)
Notified patient of LDCT lung cancer screening program results with recommendation for 12 month follow up imaging. Also notified of incidental findings noted below and is encouraged to discuss further with PCP who will receive a copy of this note and/or the CT report. Patient verbalizes understanding.  

## 2018-06-20 DIAGNOSIS — I2510 Atherosclerotic heart disease of native coronary artery without angina pectoris: Secondary | ICD-10-CM | POA: Insufficient documentation

## 2018-06-20 DIAGNOSIS — J439 Emphysema, unspecified: Secondary | ICD-10-CM | POA: Insufficient documentation

## 2018-06-22 ENCOUNTER — Encounter: Payer: Self-pay | Admitting: *Deleted

## 2018-06-28 ENCOUNTER — Other Ambulatory Visit: Payer: Self-pay

## 2018-06-28 ENCOUNTER — Ambulatory Visit (INDEPENDENT_AMBULATORY_CARE_PROVIDER_SITE_OTHER): Payer: Managed Care, Other (non HMO) | Admitting: Vascular Surgery

## 2018-06-28 ENCOUNTER — Ambulatory Visit (INDEPENDENT_AMBULATORY_CARE_PROVIDER_SITE_OTHER): Payer: Managed Care, Other (non HMO)

## 2018-06-28 ENCOUNTER — Encounter (INDEPENDENT_AMBULATORY_CARE_PROVIDER_SITE_OTHER): Payer: Self-pay | Admitting: Vascular Surgery

## 2018-06-28 VITALS — BP 129/77 | HR 79 | Resp 10 | Ht 72.0 in | Wt 206.0 lb

## 2018-06-28 DIAGNOSIS — R600 Localized edema: Secondary | ICD-10-CM

## 2018-06-28 DIAGNOSIS — I6523 Occlusion and stenosis of bilateral carotid arteries: Secondary | ICD-10-CM

## 2018-06-28 DIAGNOSIS — Z79899 Other long term (current) drug therapy: Secondary | ICD-10-CM

## 2018-06-28 DIAGNOSIS — Z87891 Personal history of nicotine dependence: Secondary | ICD-10-CM

## 2018-06-28 DIAGNOSIS — I10 Essential (primary) hypertension: Secondary | ICD-10-CM

## 2018-06-28 DIAGNOSIS — I70213 Atherosclerosis of native arteries of extremities with intermittent claudication, bilateral legs: Secondary | ICD-10-CM

## 2018-06-28 DIAGNOSIS — M7989 Other specified soft tissue disorders: Secondary | ICD-10-CM

## 2018-06-28 NOTE — Assessment & Plan Note (Signed)
His ABIs today are stable at 0.93 on the right and 0.98 on the left with good waveforms which are triphasic and normal digital pressures bilaterally. Has undergone bilateral lower extremity revascularization in the past and is doing well.  We will see him back in 6 months with noninvasive studies.  Continue current medical regimen.

## 2018-06-28 NOTE — Patient Instructions (Signed)
Peripheral Vascular Disease  Peripheral vascular disease (PVD) is a disease of the blood vessels that are not part of your heart and brain. A simple term for PVD is poor circulation. In most cases, PVD narrows the blood vessels that carry blood from your heart to the rest of your body. This can reduce the supply of blood to your arms, legs, and internal organs, like your stomach or kidneys. However, PVD most often affects a person's lower legs and feet. Without treatment, PVD tends to get worse. PVD can also lead to acute ischemic limb. This is when an arm or leg suddenly cannot get enough blood. This is a medical emergency. Follow these instructions at home: Lifestyle  Do not use any products that contain nicotine or tobacco, such as cigarettes and e-cigarettes. If you need help quitting, ask your doctor.  Lose weight if you are overweight. Or, stay at a healthy weight as told by your doctor.  Eat a diet that is low in fat and cholesterol. If you need help, ask your doctor.  Exercise regularly. Ask your doctor for activities that are right for you. General instructions  Take over-the-counter and prescription medicines only as told by your doctor.  Take good care of your feet: ? Wear comfortable shoes that fit well. ? Check your feet often for any cuts or sores.  Keep all follow-up visits as told by your doctor This is important. Contact a doctor if:  You have cramps in your legs when you walk.  You have leg pain when you are at rest.  You have coldness in a leg or foot.  Your skin changes.  You are unable to get or have an erection (erectile dysfunction).  You have cuts or sores on your feet that do not heal. Get help right away if:  Your arm or leg turns cold, numb, and blue.  Your arms or legs become red, warm, swollen, painful, or numb.  You have chest pain.  You have trouble breathing.  You suddenly have weakness in your face, arm, or leg.  You become very  confused or you cannot speak.  You suddenly have a very bad headache.  You suddenly cannot see. Summary  Peripheral vascular disease (PVD) is a disease of the blood vessels.  A simple term for PVD is poor circulation. Without treatment, PVD tends to get worse.  Treatment may include exercise, low fat and low cholesterol diet, and quitting smoking. This information is not intended to replace advice given to you by your health care provider. Make sure you discuss any questions you have with your health care provider. Document Released: 06/17/2009 Document Revised: 04/30/2016 Document Reviewed: 04/30/2016 Elsevier Interactive Patient Education  2019 [employer]  

## 2018-06-28 NOTE — Progress Notes (Signed)
MRN : [id number]  [name] [name] is a 58 y.o. (11-Dec-1960) male who presents with chief complaint of  Chief Complaint  Patient presents with  . Follow-up  .  History of Present Illness: Patient returns today in follow up of his PAD.  He is doing well.  He does have significant neuropathy in his legs but is not having any current lifestyle limiting claudication, ischemic rest pain, or ulceration.  His ABIs today are stable at 0.93 on the right and 0.98 on the left with good waveforms which are triphasic and normal digital pressures bilaterally.  His swelling is also significantly improved with the regular use of compression stockings.  He has seen podiatry and has a metatarsal fracture which they are considering surgery for on the left foot.  Current Outpatient Medications  Medication Sig Dispense Refill  . amLODipine (NORVASC) 10 MG tablet Take 10 mg by mouth daily.    Marland Kitchen aspirin EC 81 MG tablet Take 81 mg by mouth daily.    . clopidogrel (PLAVIX) 75 MG tablet Take 75 mg by mouth daily.    . metoprolol (TOPROL-XL) 200 MG 24 hr tablet Take 200 mg by mouth daily.     . Multiple Vitamin (MULTIVITAMIN WITH MINERALS) TABS tablet Take 1 tablet by mouth daily.    Marland Kitchen omega-3 acid ethyl esters (LOVAZA) 1 G capsule Take 4 g by mouth daily.     . pantoprazole (PROTONIX) 40 MG tablet Take 40 mg by mouth daily.    . rosuvastatin (CRESTOR) 40 MG tablet Take 40 mg by mouth daily.      No current facility-administered medications for this visit.     Past Medical History:  Diagnosis Date  . Anemia    pernicious anemia  . Chest pain    Unspecified  . Chronic kidney disease   . Dysrhythmia    pac's.  okay now.  Marland Kitchen GERD (gastroesophageal reflux disease)   . Hypertension   . Hypertriglyceridemia   . Kidney stone on left side    2018  . Palpitations   . Peripheral vascular disease ([place]) 2019  . Pre-diabetes   . Thyroid nodule 2019   being monitored    Past Surgical History:  Procedure  Laterality Date  . CYSTOSCOPY W/ RETROGRADES Bilateral 03/23/2017   Procedure: CYSTOSCOPY WITH RETROGRADE PYELOGRAM;  Surgeon: Abbie Sons, MD;  Location: [facility];  Service: Urology;  Laterality: Bilateral;  . CYSTOSCOPY W/ URETERAL STENT PLACEMENT Left 03/31/2017   Procedure: CYSTOSCOPY WITH STENT REPLACEMENT;  Surgeon: Abbie Sons, MD;  Location: [facility];  Service: Urology;  Laterality: Left;  . CYSTOSCOPY/RETROGRADE/URETEROSCOPY/STONE EXTRACTION WITH BASKET Left 03/31/2017   Procedure: CYSTOSCOPY/RETROGRADE/URETEROSCOPY/STONE EXTRACTION WITH BASKET;  Surgeon: Abbie Sons, MD;  Location: [facility];  Service: Urology;  Laterality: Left;  . CYSTOSCOPY/URETEROSCOPY/HOLMIUM LASER/STENT PLACEMENT Left 03/23/2017   Procedure: CYSTOSCOPY/URETEROSCOPY/HOLMIUM LASER/STENT PLACEMENT;  Surgeon: Abbie Sons, MD;  Location: [facility];  Service: Urology;  Laterality: Left;  . ESOPHAGOGASTRODUODENOSCOPY (EGD) WITH PROPOFOL N/A 10/22/2015   Procedure: ESOPHAGOGASTRODUODENOSCOPY (EGD) WITH PROPOFOL;  Surgeon: Lucilla Lame, MD;  Location: [facility];  Service: Endoscopy;  Laterality: N/A;  . EXTRACORPOREAL SHOCK WAVE LITHOTRIPSY  2016  . EXTRACORPOREAL SHOCK WAVE LITHOTRIPSY Left 09/13/2014   Procedure: EXTRACORPOREAL SHOCK WAVE LITHOTRIPSY (ESWL);  Surgeon: Hollice Espy, MD;  Location: [facility];  Service: Urology;  Laterality: Left;  . LOWER EXTREMITY ANGIOGRAPHY Left 07/15/2017   Procedure: LOWER EXTREMITY ANGIOGRAPHY;  Surgeon: Algernon Huxley, MD;  Location: [facility]  INVASIVE CV LAB;  Service: Cardiovascular;  Laterality: Left;  . lower extremity interventions x2 right leg Right    stent  . PERIPHERAL VASCULAR CATHETERIZATION Right 08/20/2014   Procedure: Lower Extremity Angiography;  Surgeon: Algernon Huxley, MD;  Location: [place] [facility];  Service: Cardiovascular;  Laterality: Right;  . PERIPHERAL VASCULAR CATHETERIZATION Right 08/20/2014   Procedure: Lower Extremity Intervention;  Surgeon:  Algernon Huxley, MD;  Location: [place] [facility];  Service: Cardiovascular;  Laterality: Right;   Social History        Tobacco Use  . Smoking status: Former Smoker    Packs/day: 1.00    Years: 37.00    Pack years: 37.00    Last attempt to quit: 04/21/2013    Years since quitting: 4.3  . Smokeless tobacco: Never Used  Substance Use Topics  . Alcohol use: Yes    Alcohol/week: 8.4 oz    Types: 7 Glasses of wine, 7 Cans of beer per week    Comment: trying to cut back on daily consumption  . Drug use: No     Family History      Family History  Problem Relation Age of Onset  . Heart failure Unknown   No bleeding disorders, clotting disorders, autoimmune diseases, or aneurysms       Allergies  Allergen Reactions  . Fenofibrate Other (See Comments)    Reaction: causes abdominal pain     REVIEW OF SYSTEMS(Negative unless checked)  Constitutional: [] ?Weight loss [] ?Fever [] ?Chills Cardiac: [] ?Chest pain [] ?Chest pressure [] ?Palpitations [] ?Shortness of breath when laying flat [] ?Shortness of breath at rest [] ?Shortness of breath with exertion. Vascular: [x] ?Pain in legs with walking [] ?Pain in legs at rest [] ?Pain in legs when laying flat [] ?Claudication [] ?Pain in feet when walking [] ?Pain in feet at rest [] ?Pain in feet when laying flat [] ?History of DVT [] ?Phlebitis [x] ?Swelling in legs [] ?Varicose veins [] ?Non-healing ulcers Pulmonary: [] ?Uses home oxygen [] ?Productive cough [] ?Hemoptysis [] ?Wheeze [] ?COPD [] ?Asthma Neurologic: [] ?Dizziness [] ?Blackouts [] ?Seizures [] ?History of stroke [] ?History of TIA [] ?Aphasia [] ?Temporary blindness [] ?Dysphagia [] ?Weakness or numbness in arms [] ?Weakness or numbness in legs Musculoskeletal: [x] ?Arthritis [] ?Joint swelling [] ?Joint pain [] ?Low back pain Hematologic: [] ?Easy bruising [] ?Easy bleeding [] ?Hypercoagulable state  [x] ?Anemic  Gastrointestinal: [] ?Blood in stool [] ?Vomiting blood [x] ?Gastroesophageal reflux/heartburn [] ?Abdominal pain Genitourinary: [x] ?Chronic kidney disease [] ?Difficult urination [] ?Frequent urination [] ?Burning with urination [] ?Hematuria Skin: [] ?Rashes [] ?Ulcers [] ?Wounds Psychological: [] ?History of anxiety [] ?History of major depression.    Physical Examination  BP 129/77 (BP Location: Left Arm, Patient Position: Sitting, Cuff Size: Large)   Pulse 79   Resp 10   Ht 6' (1.829 m)   Wt 206 lb (93.4 kg)   BMI 27.94 kg/m  Gen:  WD/WN, NAD Head: [place]/AT, No temporalis wasting. Ear/Nose/Throat: Hearing grossly intact, nares w/o erythema or drainage Eyes: Conjunctiva clear. Sclera non-icteric Neck: Supple.  Trachea midline Pulmonary:  Good air movement, no use of accessory muscles.  Cardiac: RRR, no JVD Vascular:  Vessel Right Left  Radial Palpable Palpable                          PT Palpable Palpable  DP Palpable Palpable    Musculoskeletal: M/S 5/5 throughout.  No deformity or atrophy.  No significant lower extremity edema present today. Neurologic: Sensation grossly intact in extremities.  Symmetrical.  Speech is fluent.  Psychiatric: Judgment intact, Mood & affect appropriate for pt's clinical situation. Dermatologic: No rashes or ulcers noted.  No cellulitis or open wounds.  Labs No results found for this or any previous visit (from the past 2160 hour(s)).  Radiology Ct Chest Lung Cancer Screening Low Dose Wo Contrast  Result Date: 06/09/2018 CLINICAL DATA:  58 year old male former smoker, quit 5 years ago, with 37 pack-year history of smoking, for initial lung cancer screening EXAM: CT CHEST WITHOUT CONTRAST LOW-DOSE FOR LUNG CANCER SCREENING TECHNIQUE: Multidetector CT imaging of the chest was performed following the standard protocol without IV contrast. COMPARISON:  None. FINDINGS: Cardiovascular: Heart is normal in size.   No pericardial effusion. No evidence of thoracic aortic aneurysm. Mild coronary atherosclerosis the LAD. Mediastinum/Nodes: No suspicious mediastinal lymphadenopathy. Visualized thyroid is unremarkable. Lungs/Pleura: Mild biapical pleural-parenchymal scarring. Mild centrilobular and paraseptal emphysematous changes in the upper lobes. No focal consolidation. 4.0 mm right lower lobe nodule. No pleural effusion or pneumothorax. Upper Abdomen: Visualized upper abdomen is notable for an incompletely visualized interpolar right renal cyst (series 2/image 36), better evaluated on prior CT abdomen/pelvis. Musculoskeletal: Degenerative changes of the visualized thoracolumbar spine. IMPRESSION: Lung-RADS 2, benign appearance or behavior. Continue annual screening with low-dose chest CT without contrast in 12 months. Emphysema (ICD10-J43.9). Electronically Signed   By: Julian Hy M.D.   On: 06/09/2018 15:28    Assessment/Plan Benign essential HTN blood pressure control important in reducing the progression of atherosclerotic disease. On appropriate oral medications.   Carotid stenosis No recent symptoms.  Has been mild.  Will check at his next visit.  Swelling of limb Stable.  Does have a lymphedema pump and has been wearing compression stockings.  Appears better at this point and may have been related to the metatarsal fracture.  Atherosclerosis of native arteries of extremity with intermittent claudication (HCC) His ABIs today are stable at 0.93 on the right and 0.98 on the left with good waveforms which are triphasic and normal digital pressures bilaterally. Has undergone bilateral lower extremity revascularization in the past and is doing well.  We will see him back in 6 months with noninvasive studies.  Continue current medical regimen.    Leotis Pain, MD  06/28/2018 11:45 AM    This note was created with Dragon medical transcription system.  Any errors from dictation are purely  unintentional

## 2018-09-02 ENCOUNTER — Encounter (INDEPENDENT_AMBULATORY_CARE_PROVIDER_SITE_OTHER): Payer: Managed Care, Other (non HMO)

## 2018-09-02 ENCOUNTER — Ambulatory Visit (INDEPENDENT_AMBULATORY_CARE_PROVIDER_SITE_OTHER): Payer: Managed Care, Other (non HMO) | Admitting: Vascular Surgery

## 2019-01-03 ENCOUNTER — Encounter (INDEPENDENT_AMBULATORY_CARE_PROVIDER_SITE_OTHER): Payer: Managed Care, Other (non HMO)

## 2019-01-03 ENCOUNTER — Ambulatory Visit (INDEPENDENT_AMBULATORY_CARE_PROVIDER_SITE_OTHER): Payer: Managed Care, Other (non HMO) | Admitting: Vascular Surgery

## 2019-05-30 ENCOUNTER — Encounter (INDEPENDENT_AMBULATORY_CARE_PROVIDER_SITE_OTHER): Payer: Managed Care, Other (non HMO)

## 2019-05-30 ENCOUNTER — Ambulatory Visit (INDEPENDENT_AMBULATORY_CARE_PROVIDER_SITE_OTHER): Payer: Managed Care, Other (non HMO) | Admitting: Vascular Surgery

## 2019-06-02 ENCOUNTER — Telehealth: Payer: Self-pay | Admitting: *Deleted

## 2019-06-02 NOTE — Telephone Encounter (Signed)
(  06/02/2019) Left message for patient to notify them that it is time to schedule annual low dose lung cancer screening CT scan. Instructed patient to call back to verify information prior to the scan being scheduled SRW

## 2019-06-27 ENCOUNTER — Telehealth: Payer: Self-pay | Admitting: *Deleted

## 2019-06-27 NOTE — Telephone Encounter (Signed)
(  06/27/19) Pt has been notified that lung cancer screening CT scan is due currently or will be in near future. Confirmed pt is within appropriate age range, and asymptomatic. Pt denies illness that would prevent curative treatment for lung cancer if found. Verified smoking history (Former Smoker since 2015, 1 ppd). Pt will receive 2nd COVID VX on (08/04/19) [CT to be scheduled approx. 4 weeks after vx date] Pt is agreeable for CT scan being scheduled, but wants to wait until after he has received his 2nd vax.  SRW

## 2019-08-01 ENCOUNTER — Ambulatory Visit: Payer: Self-pay | Admitting: General Surgery

## 2019-08-01 NOTE — H&P (Signed)
PATIENT PROFILE: [name] [name] is a 59 y.o. male who presents to the Clinic for consultation at the request of Dr. Ouida Sills for evaluation of ventral hernia.  PCP:  Harrold Donath, MD  HISTORY OF PRESENT ILLNESS: Mr. [name] [name] reports feeling hernia on the supraumbilical area since 49-monthago.  He reported that he is feeling some discomfort in that area.  Denies any pain radiation.  There is no alleviating or aggravating factor.  The patient endorses that he has been increasing in size.  Denies any abdominal distention nausea or vomiting.  Denies any previous abdominal surgeries.   PROBLEM LIST:        Problem List  Date Reviewed: 05/31/2018       Noted   Emphysema lung (CMS-HCC) 06/20/2018   Overview    On screening ct chest      Coronary artery disease involving native coronary artery 06/20/2018   Overview    Mild LAD on screening ct      Hepatic steatosis 05/06/2017   Overview    On ct 2018      Hyperglycemia 05/06/2017   Ataxia 04/30/2016   Diplopia 04/30/2016   Chronic gouty arthritis 12/26/2015   Overview    One or two flairs plus uric acid stones, with ckd II-III noted      Anemia 11/21/2015   Overview    Unclear cause at [facility], ferritin high, carrier of hemachromatosis. b12 and folate ok.       Pernicious anemia 05/23/2015   Health care maintenance 05/23/2015   Overview    Colonoscopy last 11-15 with polyps. Flu at work, declines other vaccines, has covid antibodies       Multinodular goiter 01/10/2015   Overview    2 cm on carotid doppler      History of tobacco use 01/13/2014   Overview    Ct handout 1-18      PVD (peripheral vascular disease) (CMS-HCC) 09/03/2013   Benign hypertension with chronic kidney disease, stage III 09/03/2013   Overview    [place], improving arf       Hypertriglyceridemia 09/03/2013      GENERAL REVIEW OF SYSTEMS:    General ROS: negative for - chills, fatigue, fever, weight gain or weight loss Allergy and Immunology ROS: negative for - hives  Hematological and Lymphatic ROS: negative for - bleeding problems or bruising, negative for palpable nodes Endocrine ROS: negative for - heat or cold intolerance, hair changes Respiratory ROS: negative for - cough, shortness of breath or wheezing Cardiovascular ROS: no chest pain or palpitations GI ROS: negative for nausea, vomiting, abdominal pain, diarrhea, constipation Musculoskeletal ROS: negative for - joint swelling or muscle pain Neurological ROS: negative for - confusion, syncope Dermatological ROS: negative for pruritus and rash Psychiatric: negative for anxiety, depression, difficulty sleeping and memory loss  MEDICATIONS: Current Medications        Current Outpatient Medications  Medication Sig Dispense Refill  . amLODIPine (NORVASC) 10 MG tablet TAKE 1 TABLET BY MOUTH ONCE DAILY 90 tablet 3  . aspirin 81 MG EC tablet Take 81 mg by mouth once daily.    . clopidogreL (PLAVIX) 75 mg tablet TAKE 1 TABLET BY MOUTH ONCE DAILY 90 tablet 3  . metoprolol succinate (TOPROL-XL) 200 MG XL tablet TAKE 1 TABLET BY MOUTH  DAILY 90 tablet 3  . multivitamin tablet Take 1 tablet by mouth once daily.    .Marland Kitchenomega-3 acid ethyl esters (LOVAZA) 1 gram capsule  TAKE 4 CAPSULES BY MOUTH  DAILY 360 capsule 3  . pantoprazole (PROTONIX) 40 MG DR tablet TAKE 1 TABLET BY MOUTH  DAILY 90 tablet 3  . rosuvastatin (CRESTOR) 40 MG tablet TAKE 1 TABLET BY MOUTH ONCE DAILY 90 tablet 1   No current facility-administered medications for this visit.      ALLERGIES: Patient has no known allergies.  PAST MEDICAL HISTORY:     Past Medical History:  Diagnosis Date  . Anemia    resolved 01/2016  . Arthritis 2016  . Emphysema lung (CMS-HCC)   . GERD (gastroesophageal reflux disease)   . Gout   . History of blood transfusion 2017  . Hyperlipidemia   .  Hypertension   . Kidney failure    resolved in 10/2015  . PVD (peripheral vascular disease) (CMS-HCC)   . Thyroid disease 2018  . Tobacco abuse     PAST SURGICAL HISTORY:      Past Surgical History:  Procedure Laterality Date  . COLONOSCOPY  07/24/2010  . EGD  07/24/2010  . VASCULAR SURGERY  2014-2016     FAMILY HISTORY:      Family History  Problem Relation Age of Onset  . Heart failure Mother   . Lymphoma Maternal Grandmother   . Cancer Maternal Grandmother   . No Known Problems Father   . No Known Problems Maternal Grandfather   . No Known Problems Paternal Grandmother   . No Known Problems Paternal Grandfather      SOCIAL HISTORY: Social History          Socioeconomic History  . Marital status: Married    Spouse name: Not on file  . Number of children: Not on file  . Years of education: Not on file  . Highest education level: Not on file  Occupational History  . Not on file  Tobacco Use  . Smoking status: Former Smoker    Packs/day: 1.00    Years: 1.00    Pack years: 1.00    Types: Cigarettes    Start date: 12/05/1976    Quit date: 04/2013    Years since quitting: 6.3  . Smokeless tobacco: Never Used  Substance and Sexual Activity  . Alcohol use: Yes    Alcohol/week: 14.0 standard drinks    Types: 14 Glasses of wine per week  . Drug use: No  . Sexual activity: Yes    Partners: Female    Birth control/protection: None  Other Topics Concern  . Not on file  Social History Narrative  . Not on file   Social Determinants of Health      Financial Resource Strain:   . Difficulty of Paying Living Expenses:   Food Insecurity:   . Worried About Charity fundraiser in the Last Year:   . Arboriculturist in the Last Year:   Transportation Needs:   . Film/video editor (Medical):   Marland Kitchen Lack of Transportation (Non-Medical):       PHYSICAL EXAM:    Vitals:   08/01/19 1608  BP: 130/75  Pulse: 73    Body mass index is 26.58 kg/m. Weight: 88.9 kg (196 lb)   GENERAL: Alert, active, oriented x3  HEENT: Pupils equal reactive to light. Extraocular movements are intact. Sclera clear. Palpebral conjunctiva normal red color.Pharynx clear.  NECK: Supple with no palpable mass and no adenopathy.  LUNGS: Sound clear with no rales rhonchi or wheezes.  HEART: Regular rhythm S1 and S2 without murmur.  ABDOMEN:  Soft and depressible, nontender with no palpable mass, no hepatomegaly.  Large diastases recti with ventral hernia on the total aspect just above the umbilical area.  Not able to reduce most likely some incarcerated fat-containing hernia.  EXTREMITIES: Well-developed well-nourished symmetrical with no dependent edema.  NEUROLOGICAL: Awake alert oriented, facial expression symmetrical, moving all extremities.  REVIEW OF DATA: I have reviewed the following data today:      Orders Only on 07/20/2019  Component Date Value  . Glucose Random - [facility] 07/20/2019 123*  . Blood Urea Nitrogen - [facility]* 07/20/2019 14   . Creatinine  - [facility] 07/20/2019 0.90   . eGFR If NonAfricn Am - L* 07/20/2019 93   . eGFR If Africn Am - [facility] 07/20/2019 107   . Bun/Creatinine Ratio - L* 07/20/2019 16   . Sodium - [facility] 07/20/2019 141   . Potassium - [facility] 07/20/2019 4.1   . Chloride - [facility] 07/20/2019 104   . Carbon Dioxide - [facility] 07/20/2019 19*  . Calcium - [facility] 07/20/2019 9.5   . Cholesterol, Total - Lab* 07/20/2019 154   . Triglycerides - [facility] 07/20/2019 237*  . Hdl Cholesterol - [facility] 07/20/2019 59   . VLDL Cholesterol Cal - L* 07/20/2019 37   . Low Density Lipoprotein * 07/20/2019 58   . LDL/HDL Ratio - [facility] 07/20/2019 1.0   . Protein Total - [facility] 07/20/2019 7.6   . Albumin - [facility] 07/20/2019 4.8   . Bilirubin Total - [facility] 07/20/2019 0.4   . Bilirubin, Direct - [facility] 07/20/2019 0.16   . Alkaline Phosphatase - L* 07/20/2019 82   . Ast - [facility]  07/20/2019 39   . Alanine Aminotransferase* 07/20/2019 45*  . Creatinine, Urine - [facility] 07/20/2019 157.9   . Microalbum.,U,Random - L* 07/20/2019 91.9   . Microalb/Creat Ratio - L* 07/20/2019 58*  . Hemoglobin A1c - [facility] 07/20/2019 5.7*  . Prostate Specific Ag, Se* 07/20/2019 1.6   . SARS-CoV-2 Semi-Quant To* 07/20/2019 >250.00   . Specimen Status Report -* 07/20/2019 Comment      ASSESSMENT: [name] [name] is a 59 y.o. male presenting for consultation for ventral hernia.    Patient oriented about the diagnosis of ventral hernia.  He was also oriented about the diagnosis of diastases recti.  The difference with the diastases in the hernia was explained to the patient.  I discussed with the patient the goal of the surgery that is to fix the hernia portion but this will not fix the diastasis portion.  Due to the size of the palpable hernia and the fact that patient has diastases this making having a weaker abdominal wall and the patient will benefit of minimally invasive surgery with large mesh to decrease the chances of recurrence.  Patient was oriented about the benefits and risks of surgery.  Risks of surgery includes bleeding, infection, injury to bowel, adhesions, bowel obstruction, need of reoperation among others.  Ventral hernia without obstruction or gangrene [K43.9]  PLAN: 1.  Robotic assisted laparoscopic ventral hernia repair with mesh ([id number]) 2. CBC 3. Hold Plavix 7 days before surgery  4. Hold Aspirin 5 days before surgery 5. Contact us if has any question or concern.  Patient verbalized understanding, all questions were answered, and were agreeable with the plan outlined above.     Herbert Pun, MD  Electronically signed by Herbert Pun, MD

## 2019-08-01 NOTE — H&P (View-Only) (Signed)
PATIENT PROFILE: [name] [name] is a 58 y.o. male who presents to the Clinic for consultation at the request of Dr. Anderson for evaluation of ventral hernia.  PCP:  Anderson, [name] Wilson III, MD  HISTORY OF PRESENT ILLNESS: [name] [name] reports feeling hernia on the supraumbilical area since 3-month ago.  He reported that he is feeling some discomfort in that area.  Denies any pain radiation.  There is no alleviating or aggravating factor.  The patient endorses that he has been increasing in size.  Denies any abdominal distention nausea or vomiting.  Denies any previous abdominal surgeries.   PROBLEM LIST:        Problem List  Date Reviewed: 05/31/2018       Noted   Emphysema lung (CMS-HCC) 06/20/2018   Overview    On screening ct chest      Coronary artery disease involving native coronary artery 06/20/2018   Overview    Mild LAD on screening ct      Hepatic steatosis 05/06/2017   Overview    On ct 2018      Hyperglycemia 05/06/2017   Ataxia 04/30/2016   Diplopia 04/30/2016   Chronic gouty arthritis 12/26/2015   Overview    One or two flairs plus uric acid stones, with ckd II-[name] noted      Anemia 11/21/2015   Overview    Unclear cause at [facility], ferritin high, carrier of hemachromatosis. b12 and folate ok.       Pernicious anemia 05/23/2015   Health care maintenance 05/23/2015   Overview    Colonoscopy last 11-15 with polyps. Flu at work, declines other vaccines, has covid antibodies       Multinodular goiter 01/10/2015   Overview    2 cm on carotid doppler      History of tobacco use 01/13/2014   Overview    Ct handout 1-18      PVD (peripheral vascular disease) (CMS-HCC) 09/03/2013   Benign hypertension with chronic kidney disease, stage [name] 09/03/2013   Overview    Arf at [facility], may have had pyelonephritis, improving arf       Hypertriglyceridemia 09/03/2013      GENERAL REVIEW OF SYSTEMS:    General ROS: negative for - chills, fatigue, fever, weight gain or weight loss Allergy and Immunology ROS: negative for - hives  Hematological and Lymphatic ROS: negative for - bleeding problems or bruising, negative for palpable nodes Endocrine ROS: negative for - heat or cold intolerance, hair changes Respiratory ROS: negative for - cough, shortness of breath or wheezing Cardiovascular ROS: no chest pain or palpitations GI ROS: negative for nausea, vomiting, abdominal pain, diarrhea, constipation Musculoskeletal ROS: negative for - joint swelling or muscle pain Neurological ROS: negative for - confusion, syncope Dermatological ROS: negative for pruritus and rash Psychiatric: negative for anxiety, depression, difficulty sleeping and memory loss  MEDICATIONS: Current Medications        Current Outpatient Medications  Medication Sig Dispense Refill  . amLODIPine (NORVASC) 10 MG tablet TAKE 1 TABLET BY MOUTH ONCE DAILY 90 tablet 3  . aspirin 81 MG EC tablet Take 81 mg by mouth once daily.    . clopidogreL (PLAVIX) 75 mg tablet TAKE 1 TABLET BY MOUTH ONCE DAILY 90 tablet 3  . metoprolol succinate (TOPROL-XL) 200 MG XL tablet TAKE 1 TABLET BY MOUTH  DAILY 90 tablet 3  . multivitamin tablet Take 1 tablet by mouth once daily.    . omega-3 acid ethyl esters (LOVAZA) 1 gram capsule   TAKE 4 CAPSULES BY MOUTH  DAILY 360 capsule 3  . pantoprazole (PROTONIX) 40 MG DR tablet TAKE 1 TABLET BY MOUTH  DAILY 90 tablet 3  . rosuvastatin (CRESTOR) 40 MG tablet TAKE 1 TABLET BY MOUTH ONCE DAILY 90 tablet 1   No current facility-administered medications for this visit.      ALLERGIES: Patient has no known allergies.  PAST MEDICAL HISTORY:     Past Medical History:  Diagnosis Date  . Anemia    resolved 01/2016  . Arthritis 2016  . Emphysema lung (CMS-HCC)   . GERD (gastroesophageal reflux disease)   . Gout   . History of blood transfusion 2017  . Hyperlipidemia   .  Hypertension   . Kidney failure    resolved in 10/2015  . PVD (peripheral vascular disease) (CMS-HCC)   . Thyroid disease 2018  . Tobacco abuse     PAST SURGICAL HISTORY:      Past Surgical History:  Procedure Laterality Date  . COLONOSCOPY  07/24/2010  . EGD  07/24/2010  . VASCULAR SURGERY  2014-2016     FAMILY HISTORY:      Family History  Problem Relation Age of Onset  . Heart failure Mother   . Lymphoma Maternal Grandmother   . Cancer Maternal Grandmother   . No Known Problems Father   . No Known Problems Maternal Grandfather   . No Known Problems Paternal Grandmother   . No Known Problems Paternal Grandfather      SOCIAL HISTORY: Social History          Socioeconomic History  . Marital status: Married    Spouse name: Not on file  . Number of children: Not on file  . Years of education: Not on file  . Highest education level: Not on file  Occupational History  . Not on file  Tobacco Use  . Smoking status: Former Smoker    Packs/day: 1.00    Years: 1.00    Pack years: 1.00    Types: Cigarettes    Start date: 12/05/1976    Quit date: 04/2013    Years since quitting: 6.3  . Smokeless tobacco: Never Used  Substance and Sexual Activity  . Alcohol use: Yes    Alcohol/week: 14.0 standard drinks    Types: 14 Glasses of wine per week  . Drug use: No  . Sexual activity: Yes    Partners: Female    Birth control/protection: None  Other Topics Concern  . Not on file  Social History Narrative  . Not on file   Social Determinants of Health      Financial Resource Strain:   . Difficulty of Paying Living Expenses:   Food Insecurity:   . Worried About Charity fundraiser in the Last Year:   . Arboriculturist in the Last Year:   Transportation Needs:   . Film/video editor (Medical):   Marland Kitchen Lack of Transportation (Non-Medical):       PHYSICAL EXAM:    Vitals:   08/01/19 1608  BP: 130/75  Pulse: 73    Body mass index is 26.58 kg/m. Weight: 88.9 kg (196 lb)   GENERAL: Alert, active, oriented x3  HEENT: Pupils equal reactive to light. Extraocular movements are intact. Sclera clear. Palpebral conjunctiva normal red color.Pharynx clear.  NECK: Supple with no palpable mass and no adenopathy.  LUNGS: Sound clear with no rales rhonchi or wheezes.  HEART: Regular rhythm S1 and S2 without murmur.  ABDOMEN:  Soft and depressible, nontender with no palpable mass, no hepatomegaly.  Large diastases recti with ventral hernia on the total aspect just above the umbilical area.  Not able to reduce most likely some incarcerated fat-containing hernia.  EXTREMITIES: Well-developed well-nourished symmetrical with no dependent edema.  NEUROLOGICAL: Awake alert oriented, facial expression symmetrical, moving all extremities.  REVIEW OF DATA: I have reviewed the following data today:      Orders Only on 07/20/2019  Component Date Value  . Glucose Random - [facility] 07/20/2019 123*  . Blood Urea Nitrogen - [facility]* 07/20/2019 14   . Creatinine  - [facility] 07/20/2019 0.90   . eGFR If NonAfricn Am - L* 07/20/2019 93   . eGFR If Africn Am - [facility] 07/20/2019 107   . Bun/Creatinine Ratio - L* 07/20/2019 16   . Sodium - [facility] 07/20/2019 141   . Potassium - [facility] 07/20/2019 4.1   . Chloride - [facility] 07/20/2019 104   . Carbon Dioxide - [facility] 07/20/2019 19*  . Calcium - [facility] 07/20/2019 9.5   . Cholesterol, Total - Lab* 07/20/2019 154   . Triglycerides - [facility] 07/20/2019 237*  . Hdl Cholesterol - [facility] 07/20/2019 59   . VLDL Cholesterol Cal - L* 07/20/2019 37   . Low Density Lipoprotein * 07/20/2019 58   . LDL/HDL Ratio - [facility] 07/20/2019 1.0   . Protein Total - [facility] 07/20/2019 7.6   . Albumin - [facility] 07/20/2019 4.8   . Bilirubin Total - [facility] 07/20/2019 0.4   . Bilirubin, Direct - [facility] 07/20/2019 0.16   . Alkaline Phosphatase - L* 07/20/2019 82   . Ast - [facility]  07/20/2019 39   . Alanine Aminotransferase* 07/20/2019 45*  . Creatinine, Urine - [facility] 07/20/2019 157.9   . Microalbum.,U,Random - L* 07/20/2019 91.9   . Microalb/Creat Ratio - L* 07/20/2019 58*  . Hemoglobin A1c - [facility] 07/20/2019 5.7*  . Prostate Specific Ag, Se* 07/20/2019 1.6   . SARS-CoV-2 Semi-Quant To* 07/20/2019 >250.00   . Specimen Status Report -* 07/20/2019 Comment      ASSESSMENT: Mr. Liles is a 58 y.o. male presenting for consultation for ventral hernia.    Patient oriented about the diagnosis of ventral hernia.  He was also oriented about the diagnosis of diastases recti.  The difference with the diastases in the hernia was explained to the patient.  I discussed with the patient the goal of the surgery that is to fix the hernia portion but this will not fix the diastasis portion.  Due to the size of the palpable hernia and the fact that patient has diastases this making having a weaker abdominal wall and the patient will benefit of minimally invasive surgery with large mesh to decrease the chances of recurrence.  Patient was oriented about the benefits and risks of surgery.  Risks of surgery includes bleeding, infection, injury to bowel, adhesions, bowel obstruction, need of reoperation among others.  Ventral hernia without obstruction or gangrene [K43.9]  PLAN: 1.  Robotic assisted laparoscopic ventral hernia repair with mesh ([id number]) 2. CBC 3. Hold Plavix 7 days before surgery  4. Hold Aspirin 5 days before surgery 5. Contact us if has any question or concern.  Patient verbalized understanding, all questions were answered, and were agreeable with the plan outlined above.     Virgen Belland Cintron-Diaz, MD  Electronically signed by Caila Cirelli Cintron-Diaz, MD  

## 2019-08-07 ENCOUNTER — Encounter
Admission: RE | Admit: 2019-08-07 | Discharge: 2019-08-07 | Disposition: A | Discharge status: Home / Self Care | Payer: Managed Care, Other (non HMO) | Source: Ambulatory Visit | Attending: General Surgery | Admitting: General Surgery

## 2019-08-07 ENCOUNTER — Other Ambulatory Visit: Payer: Self-pay

## 2019-08-07 DIAGNOSIS — Z01818 Encounter for other preprocedural examination: Secondary | ICD-10-CM | POA: Diagnosis present

## 2019-08-07 DIAGNOSIS — I10 Essential (primary) hypertension: Secondary | ICD-10-CM | POA: Diagnosis not present

## 2019-08-07 NOTE — Patient Instructions (Signed)
Your procedure is scheduled on: Friday Aug 11, 2019 Report to Day Surgery. To find out your arrival time please call [phone] between 1PM - 3PM on Thursday Aug 10, 2019.  Remember: Instructions that are not followed completely may result in serious medical risk,  up to and including death, or upon the discretion of your surgeon and anesthesiologist your  surgery may need to be rescheduled.     _X__ 1. Do not eat food after midnight the night before your procedure.                 No gum chewing or hard candies. You may drink clear liquids up to 2 hours                 before you are scheduled to arrive for your surgery- DO not drink clear                 liquids within 2 hours of the start of your surgery.                 Clear Liquids include:  water, apple juice without pulp, clear Gatorade, G2 or                  Gatorade Zero (avoid Red/Purple/Blue), Black Coffee or Tea (Do not add                 anything to coffee or tea).  __X__2.  On the morning of surgery brush your teeth with toothpaste and water, you                may rinse your mouth with mouthwash if you wish.  Do not swallow any toothpaste of mouthwash.     _X__ 3.  No Alcohol for 24 hours before or after surgery.   _X__ 4.  Do Not Smoke or use e-cigarettes For 24 Hours Prior to Your Surgery.                 Do not use any chewable tobacco products for at least 6 hours prior to                 Surgery.  _X__  5.  Do not use any recreational drugs (marijuana, cocaine, heroin, ecstacy, MDMA or other)                For at least one week prior to your surgery.  Combination of these drugs with anesthesia                May have life threatening results.  __X__ 6.  Notify your doctor if there is any change in your medical condition      (cold, fever, infections).     Do not wear jewelry, make-up, hairpins, clips or nail polish. Do not wear lotions, powders, or perfumes. You may wear deodorant. Do  not shave 48 hours prior to surgery. Men may shave face and neck. Do not bring valuables to the hospital.    [facility] is not responsible for any belongings or valuables.  Contacts, dentures or bridgework may not be worn into surgery. Leave your suitcase in the car. After surgery it may be brought to your room. For patients admitted to the hospital, discharge time is determined by your treatment team.   Patients discharged the day of surgery will not be allowed to drive home.   Make arrangements for someone to be with you for the first  24 hours of your Same Day Discharge.   __X__ Take these medicines the morning of surgery with A SIP OF WATER:    1. amLODipine (NORVASC) 10 MG   2. metoprolol (TOPROL-XL) 200 MG  3. pantoprazole (PROTONIX) 40 MG    __X__ Use CHG wipes as directed  __X__ Stop Plavix and aspirin as instructed by your doctor.  __X__ Stop Anti-inflammatories such as ibuprofen, Aleve, naproxen and or BC powders.   __X__ Stop supplements until after surgery.    __X__ Do not start any herbal supplements before your surgery.

## 2019-08-08 ENCOUNTER — Encounter
Admission: RE | Admit: 2019-08-08 | Discharge: 2019-08-08 | Disposition: A | Discharge status: Home / Self Care | Payer: Managed Care, Other (non HMO) | Source: Ambulatory Visit | Attending: General Surgery | Admitting: General Surgery

## 2019-08-08 DIAGNOSIS — Z01818 Encounter for other preprocedural examination: Secondary | ICD-10-CM | POA: Diagnosis not present

## 2019-08-09 ENCOUNTER — Other Ambulatory Visit
Admission: RE | Admit: 2019-08-09 | Discharge: 2019-08-09 | Disposition: A | Discharge status: Home / Self Care | Payer: Managed Care, Other (non HMO) | Source: Ambulatory Visit | Attending: General Surgery | Admitting: General Surgery

## 2019-08-09 ENCOUNTER — Other Ambulatory Visit: Payer: Self-pay

## 2019-08-09 DIAGNOSIS — Z20822 Contact with and (suspected) exposure to covid-19: Secondary | ICD-10-CM | POA: Diagnosis not present

## 2019-08-09 DIAGNOSIS — Z01812 Encounter for preprocedural laboratory examination: Secondary | ICD-10-CM | POA: Insufficient documentation

## 2019-08-09 LAB — SARS CORONAVIRUS 2 (TAT 6-24 HRS): SARS Coronavirus 2: NEGATIVE

## 2019-08-10 ENCOUNTER — Telehealth: Payer: Self-pay

## 2019-08-10 DIAGNOSIS — Z122 Encounter for screening for malignant neoplasm of respiratory organs: Secondary | ICD-10-CM

## 2019-08-10 DIAGNOSIS — Z87891 Personal history of nicotine dependence: Secondary | ICD-10-CM

## 2019-08-10 MED ORDER — CEFAZOLIN SODIUM-DEXTROSE 2-4 GM/100ML-% IV SOLN
2.0000 g | INTRAVENOUS | Status: AC
  Administered 2019-08-11: 2 g via INTRAVENOUS

## 2019-08-10 NOTE — Telephone Encounter (Signed)
I contacted patient to schedule annual lung screening CT scan.  Patient's last scan was 06/09/2018.  Patient is agreeable to schedule and has chosen June 17 at 3:45.  Patient was given address of [place], but states he feels he knows where to go from his last scan.  Patient confirms he had his COVID vaccines, but these were over four weeks ago. He is a former smoker and stopped around 7 years ago.

## 2019-08-11 ENCOUNTER — Other Ambulatory Visit: Payer: Self-pay

## 2019-08-11 ENCOUNTER — Encounter: Payer: Self-pay | Admitting: General Surgery

## 2019-08-11 ENCOUNTER — Ambulatory Visit: Payer: Managed Care, Other (non HMO) | Admitting: Anesthesiology

## 2019-08-11 ENCOUNTER — Observation Stay
Admission: RE | Admit: 2019-08-11 | Discharge: 2019-08-12 | Disposition: A | Discharge status: Home / Self Care | Payer: Managed Care, Other (non HMO) | Attending: General Surgery | Admitting: General Surgery

## 2019-08-11 ENCOUNTER — Encounter
Admission: RE | Disposition: A | Discharge status: Home / Self Care | Payer: Self-pay | Source: Home / Self Care | Attending: General Surgery

## 2019-08-11 DIAGNOSIS — Y92530 Ambulatory surgery center as the place of occurrence of the external cause: Secondary | ICD-10-CM | POA: Insufficient documentation

## 2019-08-11 DIAGNOSIS — Z7982 Long term (current) use of aspirin: Secondary | ICD-10-CM | POA: Diagnosis not present

## 2019-08-11 DIAGNOSIS — Z5331 Laparoscopic surgical procedure converted to open procedure: Secondary | ICD-10-CM | POA: Insufficient documentation

## 2019-08-11 DIAGNOSIS — I129 Hypertensive chronic kidney disease with stage 1 through stage 4 chronic kidney disease, or unspecified chronic kidney disease: Secondary | ICD-10-CM | POA: Insufficient documentation

## 2019-08-11 DIAGNOSIS — K219 Gastro-esophageal reflux disease without esophagitis: Secondary | ICD-10-CM | POA: Insufficient documentation

## 2019-08-11 DIAGNOSIS — K439 Ventral hernia without obstruction or gangrene: Secondary | ICD-10-CM

## 2019-08-11 DIAGNOSIS — Y838 Other surgical procedures as the cause of abnormal reaction of the patient, or of later complication, without mention of misadventure at the time of the procedure: Secondary | ICD-10-CM | POA: Insufficient documentation

## 2019-08-11 DIAGNOSIS — J439 Emphysema, unspecified: Secondary | ICD-10-CM | POA: Diagnosis not present

## 2019-08-11 DIAGNOSIS — Z79899 Other long term (current) drug therapy: Secondary | ICD-10-CM | POA: Insufficient documentation

## 2019-08-11 DIAGNOSIS — N183 Chronic kidney disease, stage 3 unspecified: Secondary | ICD-10-CM | POA: Insufficient documentation

## 2019-08-11 DIAGNOSIS — I739 Peripheral vascular disease, unspecified: Secondary | ICD-10-CM | POA: Diagnosis not present

## 2019-08-11 DIAGNOSIS — Z87891 Personal history of nicotine dependence: Secondary | ICD-10-CM | POA: Diagnosis not present

## 2019-08-11 DIAGNOSIS — I2510 Atherosclerotic heart disease of native coronary artery without angina pectoris: Secondary | ICD-10-CM | POA: Diagnosis not present

## 2019-08-11 DIAGNOSIS — K9171 Accidental puncture and laceration of a digestive system organ or structure during a digestive system procedure: Secondary | ICD-10-CM | POA: Insufficient documentation

## 2019-08-11 DIAGNOSIS — E781 Pure hyperglyceridemia: Secondary | ICD-10-CM | POA: Insufficient documentation

## 2019-08-11 DIAGNOSIS — K436 Other and unspecified ventral hernia with obstruction, without gangrene: Secondary | ICD-10-CM | POA: Diagnosis not present

## 2019-08-11 DIAGNOSIS — Z7902 Long term (current) use of antithrombotics/antiplatelets: Secondary | ICD-10-CM | POA: Diagnosis not present

## 2019-08-11 HISTORY — PX: XI ROBOTIC ASSISTED VENTRAL HERNIA: SHX6789

## 2019-08-11 HISTORY — PX: VENTRAL HERNIA REPAIR: SHX424

## 2019-08-11 LAB — CBC
HCT: 38.1 % — ABNORMAL LOW (ref 39.0–52.0)
Hemoglobin: 13 g/dL (ref 13.0–17.0)
MCH: 33.9 pg (ref 26.0–34.0)
MCHC: 34.1 g/dL (ref 30.0–36.0)
MCV: 99.2 fL (ref 80.0–100.0)
Platelets: 120 10*3/uL — ABNORMAL LOW (ref 150–400)
RBC: 3.84 MIL/uL — ABNORMAL LOW (ref 4.22–5.81)
RDW: 12.5 % (ref 11.5–15.5)
WBC: 14 10*3/uL — ABNORMAL HIGH (ref 4.0–10.5)
nRBC: 0 % (ref 0.0–0.2)

## 2019-08-11 LAB — CREATININE, SERUM
Creatinine, Ser: 1.11 mg/dL (ref 0.61–1.24)
GFR calc Af Amer: >60 mL/min (ref >60)
GFR calc non Af Amer: >60 mL/min (ref >60)

## 2019-08-11 SURGERY — REPAIR, HERNIA, VENTRAL, ROBOT-ASSISTED
Anesthesia: General | Site: Abdomen

## 2019-08-11 MED ORDER — ROCURONIUM BROMIDE 10 MG/ML (PF) SYRINGE
PREFILLED_SYRINGE | INTRAVENOUS | Status: AC
  Filled 2019-08-11: qty 10

## 2019-08-11 MED ORDER — DEXAMETHASONE SODIUM PHOSPHATE 10 MG/ML IJ SOLN
INTRAMUSCULAR | Status: AC
  Filled 2019-08-11: qty 1

## 2019-08-11 MED ORDER — FENTANYL CITRATE (PF) 100 MCG/2ML IJ SOLN
INTRAMUSCULAR | Status: AC
  Filled 2019-08-11: qty 2

## 2019-08-11 MED ORDER — EPINEPHRINE PF 1 MG/ML IJ SOLN
INTRAMUSCULAR | Status: AC
  Filled 2019-08-11: qty 1

## 2019-08-11 MED ORDER — SODIUM CHLORIDE 0.9 % IV SOLN: INTRAVENOUS | Status: DC

## 2019-08-11 MED ORDER — METOPROLOL SUCCINATE ER 100 MG PO TB24
200.0000 mg | ORAL_TABLET | Freq: Every day | ORAL | Status: DC
  Administered 2019-08-12: 10:00:00 200 mg via ORAL
  Filled 2019-08-11: qty 2

## 2019-08-11 MED ORDER — BUPIVACAINE LIPOSOME 1.3 % IJ SUSP
INTRAMUSCULAR | Status: AC
  Filled 2019-08-11: qty 20

## 2019-08-11 MED ORDER — PHENYLEPHRINE HCL-NACL 20-0.9 MG/250ML-% IV SOLN
INTRAVENOUS | Status: DC | PRN
  Administered 2019-08-11: 20 ug/min via INTRAVENOUS

## 2019-08-11 MED ORDER — PHENYLEPHRINE HCL (PRESSORS) 10 MG/ML IV SOLN
INTRAVENOUS | Status: DC | PRN
  Administered 2019-08-11: 100 ug via INTRAVENOUS
  Administered 2019-08-11: 200 ug via INTRAVENOUS
  Administered 2019-08-11: 100 ug via INTRAVENOUS
  Administered 2019-08-11 (×3): 200 ug via INTRAVENOUS

## 2019-08-11 MED ORDER — METRONIDAZOLE IN NACL 5-0.79 MG/ML-% IV SOLN
500.0000 mg | Freq: Three times a day (TID) | INTRAVENOUS | Status: DC
  Filled 2019-08-11 (×3): qty 100

## 2019-08-11 MED ORDER — PROPOFOL 10 MG/ML IV BOLUS
INTRAVENOUS | Status: AC
  Filled 2019-08-11: qty 20

## 2019-08-11 MED ORDER — ROCURONIUM BROMIDE 100 MG/10ML IV SOLN
INTRAVENOUS | Status: DC | PRN
  Administered 2019-08-11: 20 mg via INTRAVENOUS
  Administered 2019-08-11 (×2): 30 mg via INTRAVENOUS
  Administered 2019-08-11: 10 mg via INTRAVENOUS
  Administered 2019-08-11: 50 mg via INTRAVENOUS

## 2019-08-11 MED ORDER — AMLODIPINE BESYLATE 10 MG PO TABS
10.0000 mg | ORAL_TABLET | Freq: Every day | ORAL | Status: DC
  Administered 2019-08-12: 10:00:00 10 mg via ORAL
  Filled 2019-08-11: qty 1

## 2019-08-11 MED ORDER — MORPHINE SULFATE (PF) 4 MG/ML IV SOLN: 4.0000 mg | INTRAVENOUS | Status: DC | PRN

## 2019-08-11 MED ORDER — METRONIDAZOLE IN NACL 5-0.79 MG/ML-% IV SOLN
INTRAVENOUS | Status: DC | PRN
  Administered 2019-08-11: 500 mg via INTRAVENOUS

## 2019-08-11 MED ORDER — VASOPRESSIN 20 UNIT/ML IV SOLN
INTRAVENOUS | Status: DC | PRN
  Administered 2019-08-11: 1 [IU] via INTRAVENOUS

## 2019-08-11 MED ORDER — MIDAZOLAM HCL 2 MG/2ML IJ SOLN
INTRAMUSCULAR | Status: DC | PRN
  Administered 2019-08-11: 2 mg via INTRAVENOUS

## 2019-08-11 MED ORDER — LIDOCAINE HCL (CARDIAC) PF 100 MG/5ML IV SOSY
PREFILLED_SYRINGE | INTRAVENOUS | Status: DC | PRN
  Administered 2019-08-11: 60 mg via INTRAVENOUS

## 2019-08-11 MED ORDER — FENTANYL CITRATE (PF) 100 MCG/2ML IJ SOLN
INTRAMUSCULAR | Status: AC
  Administered 2019-08-11: 17:00:00 25 ug via INTRAVENOUS
  Filled 2019-08-11: qty 2

## 2019-08-11 MED ORDER — ONDANSETRON HCL 4 MG/2ML IJ SOLN
INTRAMUSCULAR | Status: AC
  Filled 2019-08-11: qty 2

## 2019-08-11 MED ORDER — DEXAMETHASONE SODIUM PHOSPHATE 10 MG/ML IJ SOLN
INTRAMUSCULAR | Status: DC | PRN
  Administered 2019-08-11: 4 mg via INTRAVENOUS

## 2019-08-11 MED ORDER — ENOXAPARIN SODIUM 40 MG/0.4ML ~~LOC~~ SOLN
40.0000 mg | SUBCUTANEOUS | Status: DC
  Filled 2019-08-11: qty 0.4

## 2019-08-11 MED ORDER — FENTANYL CITRATE (PF) 100 MCG/2ML IJ SOLN
INTRAMUSCULAR | Status: DC | PRN
  Administered 2019-08-11 (×2): 50 ug via INTRAVENOUS
  Administered 2019-08-11: 100 ug via INTRAVENOUS
  Administered 2019-08-11 (×2): 50 ug via INTRAVENOUS

## 2019-08-11 MED ORDER — FENTANYL CITRATE (PF) 100 MCG/2ML IJ SOLN
25.0000 ug | INTRAMUSCULAR | Status: DC | PRN
  Administered 2019-08-11: 25 ug via INTRAVENOUS
  Administered 2019-08-11: 17:00:00 50 ug via INTRAVENOUS

## 2019-08-11 MED ORDER — CEFAZOLIN SODIUM-DEXTROSE 2-4 GM/100ML-% IV SOLN
INTRAVENOUS | Status: AC
  Filled 2019-08-11: qty 100

## 2019-08-11 MED ORDER — ONDANSETRON HCL 4 MG/2ML IJ SOLN
INTRAMUSCULAR | Status: DC | PRN
  Administered 2019-08-11: 4 mg via INTRAVENOUS

## 2019-08-11 MED ORDER — CIPROFLOXACIN IN D5W 400 MG/200ML IV SOLN
400.0000 mg | Freq: Two times a day (BID) | INTRAVENOUS | Status: DC
  Administered 2019-08-11: 23:00:00 400 mg via INTRAVENOUS
  Filled 2019-08-11 (×2): qty 200

## 2019-08-11 MED ORDER — ACETAMINOPHEN 10 MG/ML IV SOLN
INTRAVENOUS | Status: AC
  Filled 2019-08-11: qty 100

## 2019-08-11 MED ORDER — HYDROCODONE-ACETAMINOPHEN 5-325 MG PO TABS
1.0000 | ORAL_TABLET | ORAL | Status: DC | PRN
  Administered 2019-08-11 – 2019-08-12 (×3): 2 via ORAL
  Filled 2019-08-11 (×3): qty 2

## 2019-08-11 MED ORDER — ACETAMINOPHEN 650 MG RE SUPP: 650.0000 mg | Freq: Four times a day (QID) | RECTAL | Status: DC | PRN

## 2019-08-11 MED ORDER — PANTOPRAZOLE SODIUM 40 MG PO TBEC
40.0000 mg | DELAYED_RELEASE_TABLET | Freq: Every day | ORAL | Status: DC
  Administered 2019-08-12: 10:00:00 40 mg via ORAL
  Filled 2019-08-11: qty 1

## 2019-08-11 MED ORDER — PROMETHAZINE HCL 25 MG/ML IJ SOLN: 6.2500 mg | INTRAMUSCULAR | Status: DC | PRN

## 2019-08-11 MED ORDER — ACETAMINOPHEN 325 MG PO TABS: 650.0000 mg | ORAL_TABLET | Freq: Four times a day (QID) | ORAL | Status: DC | PRN

## 2019-08-11 MED ORDER — BUPIVACAINE LIPOSOME 1.3 % IJ SUSP
INTRAMUSCULAR | Status: DC | PRN
  Administered 2019-08-11: 20 mL

## 2019-08-11 MED ORDER — METRONIDAZOLE IN NACL 5-0.79 MG/ML-% IV SOLN
500.0000 mg | Freq: Three times a day (TID) | INTRAVENOUS | Status: DC
  Administered 2019-08-11 – 2019-08-12 (×2): 500 mg via INTRAVENOUS
  Filled 2019-08-11 (×3): qty 100

## 2019-08-11 MED ORDER — BUPIVACAINE-EPINEPHRINE (PF) 0.25% -1:200000 IJ SOLN
INTRAMUSCULAR | Status: DC | PRN
  Administered 2019-08-11: 30 mL

## 2019-08-11 MED ORDER — BUPIVACAINE HCL (PF) 0.25 % IJ SOLN
INTRAMUSCULAR | Status: AC
  Filled 2019-08-11: qty 30

## 2019-08-11 MED ORDER — MIDAZOLAM HCL 2 MG/2ML IJ SOLN
INTRAMUSCULAR | Status: AC
  Filled 2019-08-11: qty 2

## 2019-08-11 MED ORDER — SUGAMMADEX SODIUM 200 MG/2ML IV SOLN
INTRAVENOUS | Status: DC | PRN
  Administered 2019-08-11: 200 mg via INTRAVENOUS

## 2019-08-11 MED ORDER — PROPOFOL 10 MG/ML IV BOLUS
INTRAVENOUS | Status: DC | PRN
  Administered 2019-08-11: 170 mg via INTRAVENOUS

## 2019-08-11 MED ORDER — ACETAMINOPHEN 10 MG/ML IV SOLN
INTRAVENOUS | Status: DC | PRN
Start: 2019-08-11 — End: 2019-08-11
  Administered 2019-08-11: 1000 mg via INTRAVENOUS

## 2019-08-11 SURGICAL SUPPLY — 63 items
BINDER ABDOMINAL 12 ML 46-62 (SOFTGOODS) ×2 IMPLANT
BLADE SURG SZ11 CARB STEEL (BLADE) ×4 IMPLANT
CANISTER SUCT 1200ML W/VALVE (MISCELLANEOUS) ×4 IMPLANT
CHLORAPREP W/TINT 26 (MISCELLANEOUS) ×4 IMPLANT
COVER TIP SHEARS 8 DVNC (MISCELLANEOUS) ×2 IMPLANT
COVER TIP SHEARS 8MM DA VINCI (MISCELLANEOUS) ×2
COVER WAND RF STERILE (DRAPES) ×8 IMPLANT
DEFOGGER SCOPE WARMER CLEARIFY (MISCELLANEOUS) ×4 IMPLANT
DERMABOND ADVANCED (GAUZE/BANDAGES/DRESSINGS) ×2
DERMABOND ADVANCED .7 DNX12 (GAUZE/BANDAGES/DRESSINGS) ×2 IMPLANT
DRAPE 3/4 80X56 (DRAPES) ×2 IMPLANT
DRAPE ARM DVNC X/XI (DISPOSABLE) ×6 IMPLANT
DRAPE COLUMN DVNC XI (DISPOSABLE) ×2 IMPLANT
DRAPE DA VINCI XI ARM (DISPOSABLE) ×6
DRAPE DA VINCI XI COLUMN (DISPOSABLE) ×2
DRSG OPSITE POSTOP 4X10 (GAUZE/BANDAGES/DRESSINGS) ×2 IMPLANT
ELECT CAUTERY BLADE TIP 2.5 (TIP) ×4
ELECT REM PT RETURN 9FT ADLT (ELECTROSURGICAL) ×4
ELECTRODE CAUTERY BLDE TIP 2.5 (TIP) IMPLANT
ELECTRODE REM PT RTRN 9FT ADLT (ELECTROSURGICAL) ×2 IMPLANT
GLOVE BIO SURGEON STRL SZ 6.5 (GLOVE) ×6 IMPLANT
GLOVE BIO SURGEONS STRL SZ 6.5 (GLOVE) ×2
GLOVE BIOGEL PI IND STRL 6.5 (GLOVE) ×4 IMPLANT
GLOVE BIOGEL PI INDICATOR 6.5 (GLOVE) ×4
GOWN STRL REUS W/ TWL LRG LVL3 (GOWN DISPOSABLE) ×6 IMPLANT
GOWN STRL REUS W/TWL LRG LVL3 (GOWN DISPOSABLE) ×6
HANDLE YANKAUER SUCT BULB TIP (MISCELLANEOUS) ×2 IMPLANT
IRRIGATOR SUCT 8 DISP DVNC XI (IRRIGATION / IRRIGATOR) IMPLANT
IRRIGATOR SUCTION 8MM XI DISP (IRRIGATION / IRRIGATOR)
IV NS 1000ML (IV SOLUTION)
IV NS 1000ML BAXH (IV SOLUTION) IMPLANT
KIT PINK PAD W/HEAD ARE REST (MISCELLANEOUS) ×4
KIT PINK PAD W/HEAD ARM REST (MISCELLANEOUS) ×2 IMPLANT
LABEL OR SOLS (LABEL) ×4 IMPLANT
LIGASURE IMPACT 36 18CM CVD LR (INSTRUMENTS) ×2 IMPLANT
NDL INSUFFLATION 14GA 120MM (NEEDLE) ×2 IMPLANT
NEEDLE HYPO 22GX1.5 SAFETY (NEEDLE) ×4 IMPLANT
NEEDLE INSUFFLATION 14GA 120MM (NEEDLE) ×4 IMPLANT
OBTURATOR OPTICAL STANDARD 8MM (TROCAR) ×2
OBTURATOR OPTICAL STND 8 DVNC (TROCAR) ×2
OBTURATOR OPTICALSTD 8 DVNC (TROCAR) ×2 IMPLANT
PACK LAP CHOLECYSTECTOMY (MISCELLANEOUS) ×4 IMPLANT
SEAL CANN UNIV 5-8 DVNC XI (MISCELLANEOUS) ×6 IMPLANT
SEAL XI 5MM-8MM UNIVERSAL (MISCELLANEOUS) ×6
SEALER VESSEL DA VINCI XI (MISCELLANEOUS) ×2
SEALER VESSEL EXT DVNC XI (MISCELLANEOUS) IMPLANT
SET TUBE SMOKE EVAC HIGH FLOW (TUBING) ×4 IMPLANT
SOLUTION ELECTROLUBE (MISCELLANEOUS) ×4 IMPLANT
SPONGE LAP 18X18 RF (DISPOSABLE) ×2 IMPLANT
STAPLER SKIN PROX 35W (STAPLE) ×2 IMPLANT
SUT DVC VLOC 3-0 CL 6 P-12 (SUTURE) ×8 IMPLANT
SUT MNCRL AB 4-0 PS2 18 (SUTURE) ×4 IMPLANT
SUT PDS AB 0 CT1 27 (SUTURE) ×4 IMPLANT
SUT SILK 2 0 (SUTURE) ×2
SUT SILK 2-0 18XBRD TIE 12 (SUTURE) IMPLANT
SUT STRATAFIX PDS 30 CT-1 (SUTURE) ×4 IMPLANT
SUT VIC AB 2-0 SH 27 (SUTURE) ×8
SUT VIC AB 2-0 SH 27XBRD (SUTURE) IMPLANT
SUT VIC AB 3-0 SH 27 (SUTURE) ×4
SUT VIC AB 3-0 SH 27X BRD (SUTURE) IMPLANT
SUT VICRYL 0 AB UR-6 (SUTURE) ×4 IMPLANT
TAPE TRANSPORE STRL 2 31045 (GAUZE/BANDAGES/DRESSINGS) ×4 IMPLANT
TRAY FOLEY MTR SLVR 16FR STAT (SET/KITS/TRAYS/PACK) IMPLANT

## 2019-08-11 NOTE — Interval H&P Note (Signed)
History and Physical Interval Note:  08/11/2019 11:48 AM  [name] [name]  has presented today for surgery, with the diagnosis of K43.9 Ventral hernia w/o obstruction or gangrene.  The various methods of treatment have been discussed with the patient and family. After consideration of risks, benefits and other options for treatment, the patient has consented to  Procedure(s): XI ROBOTIC [place] (N/A) as a surgical intervention.  The patient's history has been reviewed, patient examined, no change in status, stable for surgery.  I have reviewed the patient's chart and labs.  Questions were answered to the patient's satisfaction.     Herbert Pun

## 2019-08-11 NOTE — Transfer of Care (Signed)
Immediate Anesthesia Transfer of Care Note  Patient: [name] [name]  Procedure(s) Performed: XI ROBOTIC ASSISTED VENTRAL HERNIA (N/A Abdomen) HERNIA REPAIR VENTRAL ADULT WITH REPAIR OF LARGE INTESTINE LACERATION  Patient Location: PACU  Anesthesia Type:General  Level of Consciousness: awake and sedated  Airway & Oxygen Therapy: Patient Spontanous Breathing and Patient connected to face mask oxygen  Post-op Assessment: Report given to RN and Post -op Vital signs reviewed and stable  Post vital signs: Reviewed and stable  Last Vitals:  Vitals Value Taken Time  BP 139/78 08/11/19 1636  Temp    Pulse 87 08/11/19 1644  Resp 16 08/11/19 1644  SpO2 100 % 08/11/19 1644  Vitals shown include unvalidated device data.  Last Pain:  Vitals:   08/11/19 1128  TempSrc: Tympanic  PainSc: 0-No pain         Complications: No apparent anesthesia complications

## 2019-08-11 NOTE — Anesthesia Procedure Notes (Signed)
Procedure Name: Intubation Date/Time: 08/11/2019 1:07 PM Performed by: Letitia Neri, CRNA Pre-anesthesia Checklist: Patient identified, Patient being monitored, Timeout performed, Emergency Drugs available and Suction available Patient Re-evaluated:Patient Re-evaluated prior to induction Oxygen Delivery Method: Circle system utilized Preoxygenation: Pre-oxygenation with 100% oxygen Induction Type: IV induction Ventilation: Mask ventilation without difficulty Laryngoscope Size: Mac and 3 Grade View: Grade I Tube type: Oral Tube size: 7.0 mm Number of attempts: 1 Placement Confirmation: ETT inserted through vocal cords under direct vision,  positive ETCO2 and breath sounds checked- equal and bilateral Secured at: 21 cm Tube secured with: Tape Dental Injury: Teeth and Oropharynx as per pre-operative assessment

## 2019-08-11 NOTE — Anesthesia Postprocedure Evaluation (Signed)
Anesthesia Post Note  Patient: [name] [name]  Procedure(s) Performed: XI ROBOTIC ASSISTED VENTRAL HERNIA (N/A Abdomen) HERNIA REPAIR VENTRAL ADULT WITH REPAIR OF LARGE INTESTINE LACERATION  Patient location during evaluation: PACU Anesthesia Type: General Level of consciousness: awake and alert Pain management: pain level controlled Vital Signs Assessment: post-procedure vital signs reviewed and stable Respiratory status: spontaneous breathing, nonlabored ventilation, respiratory function stable and patient connected to nasal cannula oxygen Cardiovascular status: blood pressure returned to baseline and stable Postop Assessment: no apparent nausea or vomiting Anesthetic complications: no     Last Vitals:  Vitals:   08/11/19 1736 08/11/19 1751  BP: 124/74 129/72  Pulse: 94 91  Resp: 10 15  Temp:    SpO2: 90% 93%    Last Pain:  Vitals:   08/11/19 1751  TempSrc:   PainSc: 4                  Precious Haws [name]

## 2019-08-11 NOTE — Anesthesia Preprocedure Evaluation (Signed)
Anesthesia Evaluation  Patient identified by MRN, date of birth, ID band Patient awake    Reviewed: Allergy & Precautions, NPO status , Patient's Chart, lab work & pertinent test results, reviewed documented beta blocker date and time   History of Anesthesia Complications Negative for: history of anesthetic complications  Airway Mallampati: III  TM Distance: >3 FB     Dental  (+) Chipped, Dental Advidsory Given, Caps, Missing   Pulmonary neg shortness of breath, COPD, neg recent URI, Patient abstained from smoking., former smoker,    Pulmonary exam normal        Cardiovascular hypertension, Pt. on medications and Pt. on home beta blockers (-) angina+ Peripheral Vascular Disease  (-) Past MI and (-) Cardiac Stents Normal cardiovascular exam+ dysrhythmias (PACs in the past) (-) Valvular Problems/Murmurs     Neuro/Psych negative neurological ROS  negative psych ROS   GI/Hepatic GERD  Controlled,(+) Hepatitis -  Endo/Other  negative endocrine ROS  Renal/GU Renal disease     Musculoskeletal   Abdominal   Peds  Hematology  (+) Blood dyscrasia, anemia ,   Anesthesia Other Findings Past Medical History: No date: Anemia     Comment:  pernicious anemia No date: Chest pain     Comment:  Unspecified No date: Chronic kidney disease No date: Dysrhythmia     Comment:  pac's.  okay now. No date: GERD (gastroesophageal reflux disease) No date: Hypertension No date: Hypertriglyceridemia No date: Kidney stone on left side     Comment:  2018 No date: Palpitations 2019: Peripheral vascular disease ([place]) No date: Pre-diabetes 2019: Thyroid nodule     Comment:  being monitored   Reproductive/Obstetrics                             Anesthesia Physical  Anesthesia Plan  ASA: III  Anesthesia Plan: General   Post-op Pain Management:    Induction: Intravenous  PONV Risk Score and Plan: 3 and  Ondansetron, Dexamethasone, Midazolam, Promethazine and Treatment may vary due to age or medical condition  Airway Management Planned: Oral ETT  Additional Equipment:   Intra-op Plan:   Post-operative Plan: Extubation in OR  Informed Consent: I have reviewed the patients History and Physical, chart, labs and discussed the procedure including the risks, benefits and alternatives for the proposed anesthesia with the patient or authorized representative who has indicated his/her understanding and acceptance.       Plan Discussed with: CRNA  Anesthesia Plan Comments:         Anesthesia Quick Evaluation

## 2019-08-11 NOTE — Brief Op Note (Signed)
08/11/2019  4:42 PM  PATIENT:  [name] [name]  59 y.o. male  PRE-OPERATIVE DIAGNOSIS:  K43.9 Ventral hernia w/o obstruction or gangrene  POST-OPERATIVE DIAGNOSIS:  K43.9 Ventral hernia w/o obstruction or gangrene                                                          Large intestine injury  PROCEDURE:  Procedure(s) with comments: XI ROBOTIC ASSISTED VENTRAL HERNIA (N/A) - Converted to open procedure HERNIA REPAIR VENTRAL ADULT WITH REPAIR OF LARGE INTESTINE LACERATION  SURGEON:  Surgeon(s) and Role:    * Herbert Pun, MD - Primary  ASSISTANTS: Dr. Caroleen Hamman   ANESTHESIA:   general  EBL:  400 mL   BLOOD ADMINISTERED:none  DRAINS: none   PLAN OF CARE: Admit for overnight observation   Patient with attempted robotic assisted laparoscopic ventral hernia repair.  There was not identified Intra-Op ulceration of the large intestine.  The injury was on the anterior and posterior aspect of the transverse colon.  Due to the complexity of the location of the injury it was decided to proceed with opening of the patient for the repair of the large intestine.  Open repair of the supraumbilical hernia was also done.  Patient was hemodynamically stable.  We will keep overnight for observation, pain control and follow-up with physical exam and vital signs.  PATIENT DISPOSITION:  PACU - hemodynamically stable.

## 2019-08-11 NOTE — Op Note (Signed)
Preoperative diagnosis: Ventral Hernia (incarcerated)  Postoperative diagnosis: Ventral Hernia (incarcerated)                                               Intra operative large intestine injury  Procedure: Attempted Robotic assisted laparoscopic Ventral hernia repair with mesh                      Open ventral hernia repair                      Repair of large intestine laceration x 2 (anterior and posterior walls.   Anesthesia: General  Surgeon: Dr. Windell Moment  Assistant surgeon: Dr. Pia Mau  Wound Classification: Clean  Specimen: Hernia sac content                    Omentum  Complications: None  Estimated Blood Loss: 26ml  Indications:  Findings: 1. 3 cm ventral hernia, incarcerated 2.  Transverse colon completely adhesed to the anterior abdominal wall. 3.  Injury to the large intestine with robotic trocar upon entrance 4.  The laceration of the large intestine was clean and primary repair was able to be completed  Description of procedure: The patient was brought to the operating room and general anesthesia was induced. A time-out was completed verifying correct patient, procedure, site, positioning, and implant(s) and/or special equipment prior to beginning this procedure. Antibiotics were administered prior to making the incision. SCDs placed. The anterior abdominal wall was prepped and draped in the standard sterile fashion.   Palmer's point chosen for entry.  Veress needle placed and abdomen insufflated to 15cm without any dramatic increase in pressure.  Needle removed and optiview technique used to place 70mm port at same point.  No injury noted during placement. Exparel was infused in a TAP block. Two additional ports, 18mm x2 along left lateral aspect placed.  Xi robot then docked into place.  Upon entrance of the scope, the left upper quadrant trocar was seen going through and through the transverse colon.  Since it was clinically difficult to repair the posterior aspect  of the transverse colon, I decided to proceed with laparotomy for adequate visualization of the laceration and be able to perform an adequate repair.  Midline incision was done supraumbilically up to the xiphoid.  With cautery dissection was carried down to the anterior fascia.  Midline fascia was opened and abdominal cavity entered.  The transverse colon was mobilized from the anterior abdominal wall.  The anterior and posterior lacerations were identified.  Both lacerations were repaired in a double layer fashion with interrupted Vicryl sutures.  At this point the omentum that was attached that there are abdominal wall was bleeding so it was resected with LigaSure.  The excision was extended to the supraumbilical area where the hernia sac was identified.  The hernia sac was dissected and the hernia content was reduced.  The piece of omentum that was incarcerated in the sac was resected.  The abdominal cavity was irrigated with 2 L of saline.  No bleeding identified.  The fascia in the midline was closed with 0 PDS.  Subcutaneous tissue was approximated with 3-0 Vicryl.  Skin closed with staples.  The skin from the trocar incisions were closed with Monocryl.  Patient was then successfully awakened and transferred to PACU in stable  condition.  At the end of the procedure sponge and instrument counts were correct.

## 2019-08-12 DIAGNOSIS — K436 Other and unspecified ventral hernia with obstruction, without gangrene: Secondary | ICD-10-CM | POA: Diagnosis not present

## 2019-08-12 MED ORDER — HYDROCODONE-ACETAMINOPHEN 5-325 MG PO TABS: 1.0000 | ORAL_TABLET | ORAL | 0 refills | Status: AC | PRN

## 2019-08-12 NOTE — Discharge Instructions (Signed)

## 2019-08-12 NOTE — Discharge Summary (Signed)
  Patient ID: [name] [name] MRN: [id number] DOB/AGE: 08/07/1960 59 y.o.  Admit date: 08/11/2019 Discharge date: 08/12/2019   Discharge Diagnoses:  Active Problems:   Ventral hernia   Procedures: Attempted robotic assisted ventral hernia repair converted to open ventral hernia repair Repair of large intestinal laceration x2  Hospital Course: Patient came for robotic assisted ventral hernia repair.  Intraoperative laceration of the large intestine was identified.  Surgery was converted to an open ventral hernia repair and repair of the laceration of the large intestine.  During the surgery there was no significant spillage.  Patient tolerated the procedure well.  This morning patient feeling comfortable with minimal pain controlled with current pain medication.  He has tolerated diet.  He has ambulated.  Wound is healing well.  Physical Exam  Constitutional: He is oriented to person, place, and time and well-developed, well-nourished, and in no distress.  Cardiovascular: Normal rate.  Pulmonary/Chest: Effort normal.  Abdominal: Soft. He exhibits no distension. There is no abdominal tenderness. There is no rebound and no guarding.  Neurological: He is alert and oriented to person, place, and time.  Wound is dry and clean   Consults: None  Disposition: Discharge disposition: 01-Home or Self Care       Discharge Instructions    Diet - low sodium heart healthy   Complete by: As directed      Allergies as of 08/12/2019   No Known Allergies     Medication List    TAKE these medications   amLODipine 10 MG tablet Commonly known as: NORVASC Take 10 mg by mouth daily.   aspirin EC 81 MG tablet Take 81 mg by mouth daily.   clopidogrel 75 MG tablet Commonly known as: PLAVIX Take 75 mg by mouth daily.   HYDROcodone-acetaminophen 5-325 MG tablet Commonly known as: Norco Take 1 tablet by mouth every 4 (four) hours as needed for up to 3 days for moderate pain.   metoprolol  200 MG 24 hr tablet Commonly known as: TOPROL-XL Take 200 mg by mouth daily.   multivitamin with minerals Tabs tablet Take 1 tablet by mouth daily.   omega-3 acid ethyl esters 1 g capsule Commonly known as: LOVAZA Take 4 g by mouth daily.   pantoprazole 40 MG tablet Commonly known as: PROTONIX Take 40 mg by mouth daily.   rosuvastatin 40 MG tablet Commonly known as: CRESTOR Take 40 mg by mouth daily.      Follow-up Information    Herbert Pun, MD Follow up on 08/24/2019.   Specialty: General Surgery Contact information: [address] [place] [address] [phone]

## 2019-08-12 NOTE — Progress Notes (Signed)
[name] to be D/C'd Home per MD order.  Discussed prescriptions and follow up appointments with the patient. Prescriptions given to patient, medication list explained in detail. Pt verbalized understanding.  Allergies as of 08/12/2019   No Known Allergies     Medication List    TAKE these medications   amLODipine 10 MG tablet Commonly known as: NORVASC Take 10 mg by mouth daily.   aspirin EC 81 MG tablet Take 81 mg by mouth daily.   clopidogrel 75 MG tablet Commonly known as: PLAVIX Take 75 mg by mouth daily.   HYDROcodone-acetaminophen 5-325 MG tablet Commonly known as: Norco Take 1 tablet by mouth every 4 (four) hours as needed for up to 3 days for moderate pain.   metoprolol 200 MG 24 hr tablet Commonly known as: TOPROL-XL Take 200 mg by mouth daily.   multivitamin with minerals Tabs tablet Take 1 tablet by mouth daily.   omega-3 acid ethyl esters 1 g capsule Commonly known as: LOVAZA Take 4 g by mouth daily.   pantoprazole 40 MG tablet Commonly known as: PROTONIX Take 40 mg by mouth daily.   rosuvastatin 40 MG tablet Commonly known as: CRESTOR Take 40 mg by mouth daily.       Vitals:   08/12/19 0429 08/12/19 0754  BP: 119/69 120/64  Pulse: 74 78  Resp: 16 16  Temp: 98 F (36.7 C) 98.1 F (36.7 C)  SpO2: 95% (!) 89%    Skin clean, dry and intact without evidence of skin break down, no evidence of skin tears noted. IV catheter discontinued intact. Site without signs and symptoms of complications. Dressing and pressure applied. Pt denies pain at this time. No complaints noted.  An After Visit Summary was printed and given to the patient. Patient escorted via [place], and D/C home via private auto.  Fuller Mandril, RN

## 2019-08-13 ENCOUNTER — Encounter (INDEPENDENT_AMBULATORY_CARE_PROVIDER_SITE_OTHER): Payer: Self-pay

## 2019-08-13 LAB — TYPE AND SCREEN
ABO/RH(D): O POS
Antibody Screen: NEGATIVE
Unit division: 0
Unit division: 0

## 2019-08-13 LAB — BPAM RBC
Blood Product Expiration Date: 202106082359
Blood Product Expiration Date: 202106082359
Unit Type and Rh: 5100
Unit Type and Rh: 5100

## 2019-08-13 LAB — PREPARE RBC (CROSSMATCH)

## 2019-08-14 NOTE — Addendum Note (Signed)
Addended by: Lieutenant Diego on: 08/14/2019 01:42 PM   Modules accepted: Orders

## 2019-08-14 NOTE — Telephone Encounter (Signed)
Smoking history: former, quit 2015, 37 pack year

## 2019-08-15 LAB — SURGICAL PATHOLOGY

## 2019-08-21 ENCOUNTER — Ambulatory Visit: Payer: Managed Care, Other (non HMO)

## 2019-09-06 IMAGING — US US RENAL
1 series · 14 of 25 positions shown · non-contrast
Comparison: CT scan November 20, 2015

CLINICAL DATA: Flank pain for 3 days.

EXAM:
RENAL / URINARY TRACT ULTRASOUND COMPLETE

[Series 1: us renal · 0.26mm/px · 14 of 37 slices shown]
[im 1/37]
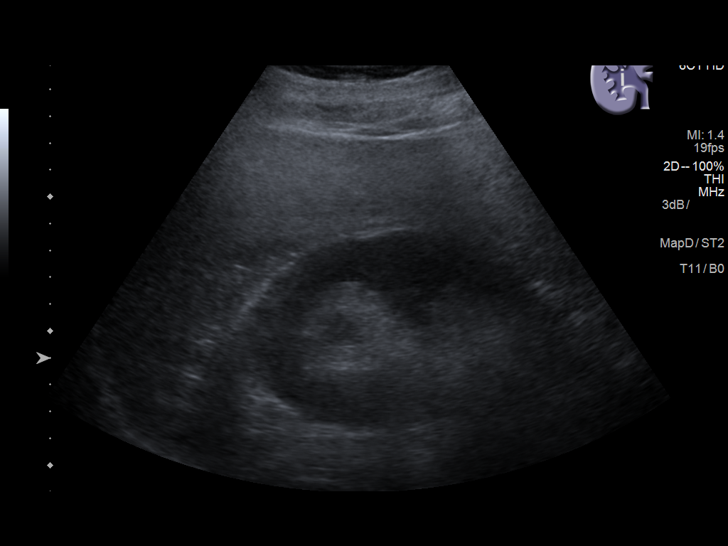
[im 4/37]
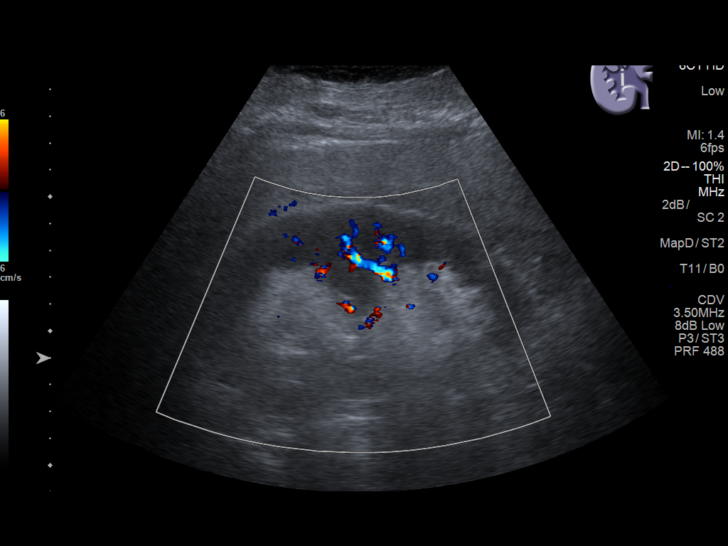
[im 7/37]
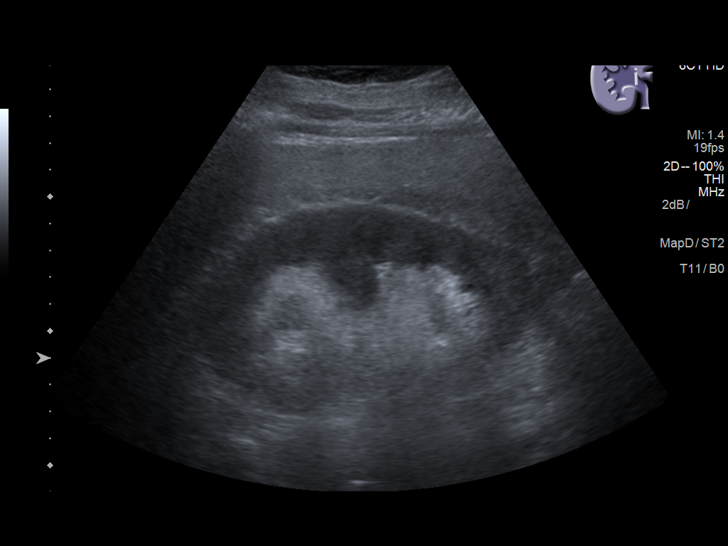
[im 10/37]
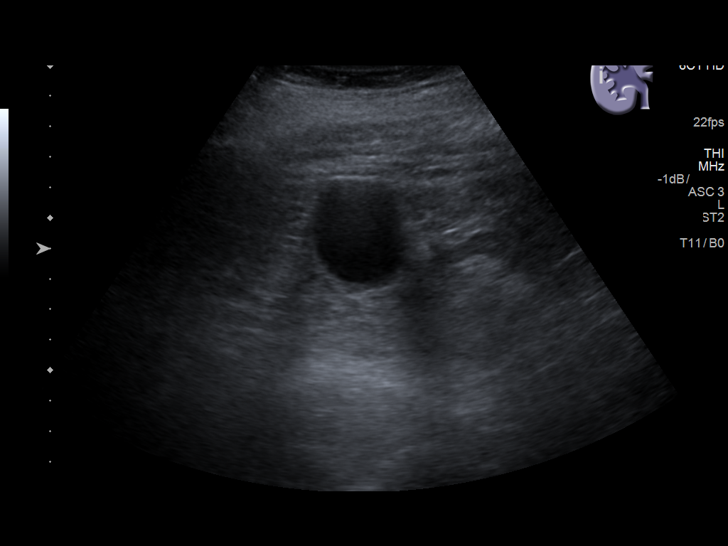
[im 13/37]
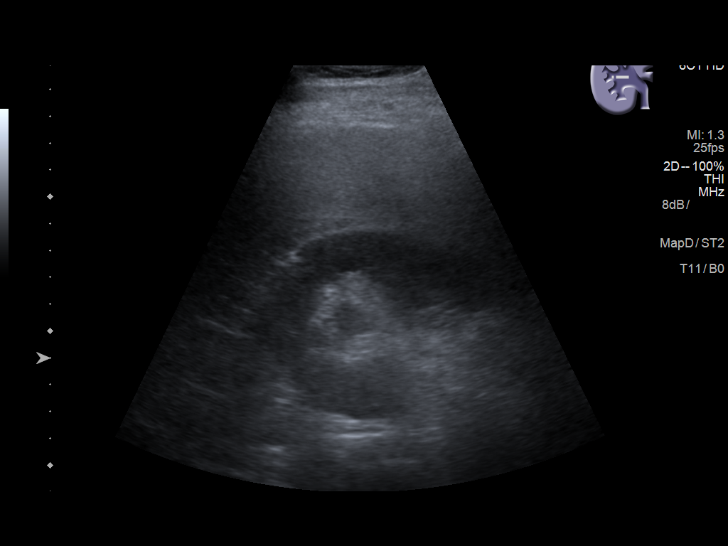
[im 14/37]
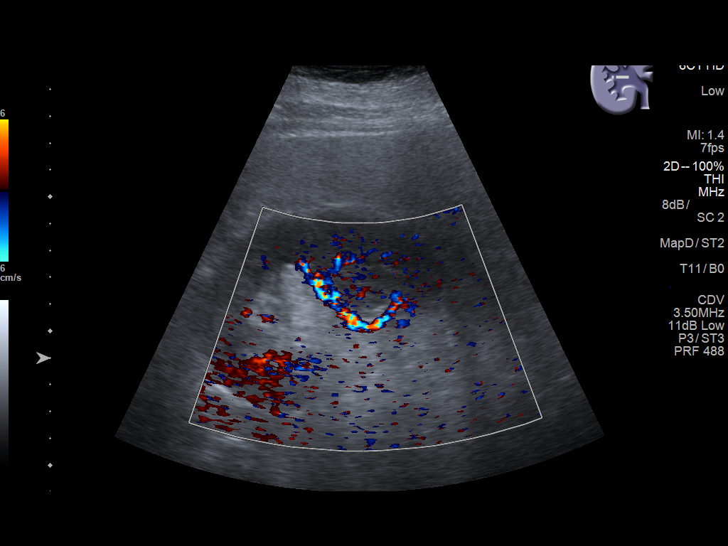
[im 17/37]
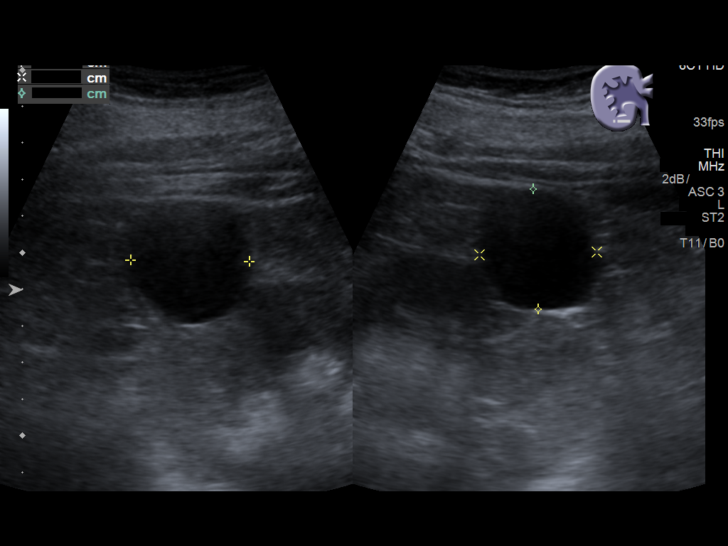
[im 20/37]
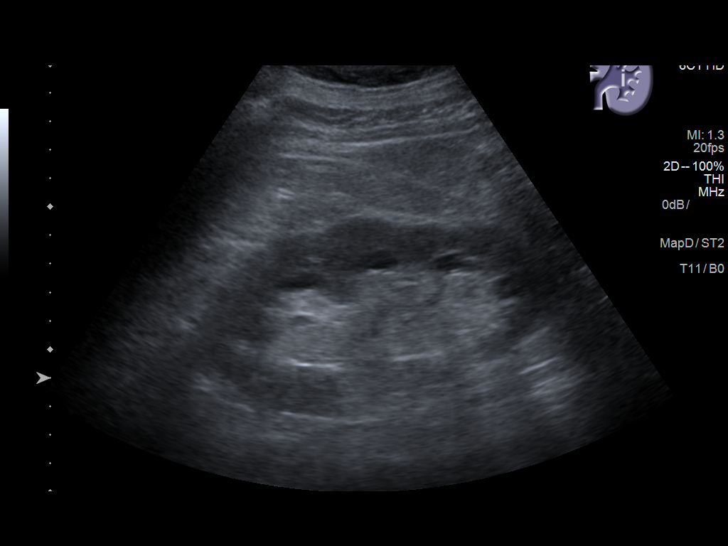
[im 23/37]
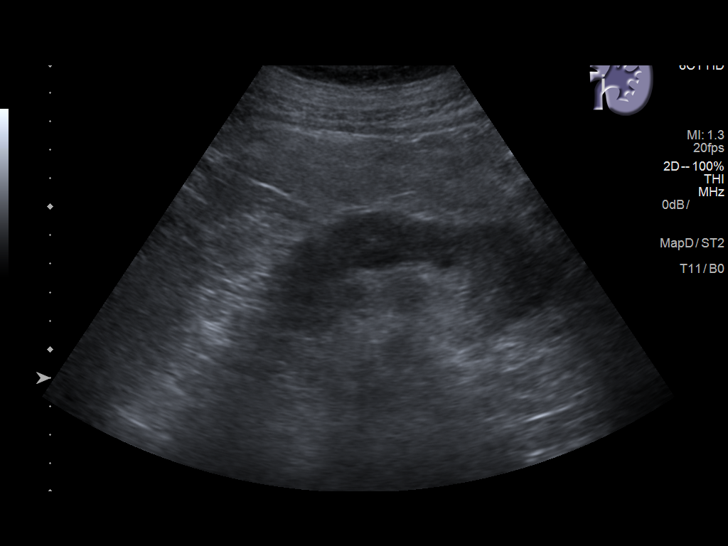
[im 25/37]
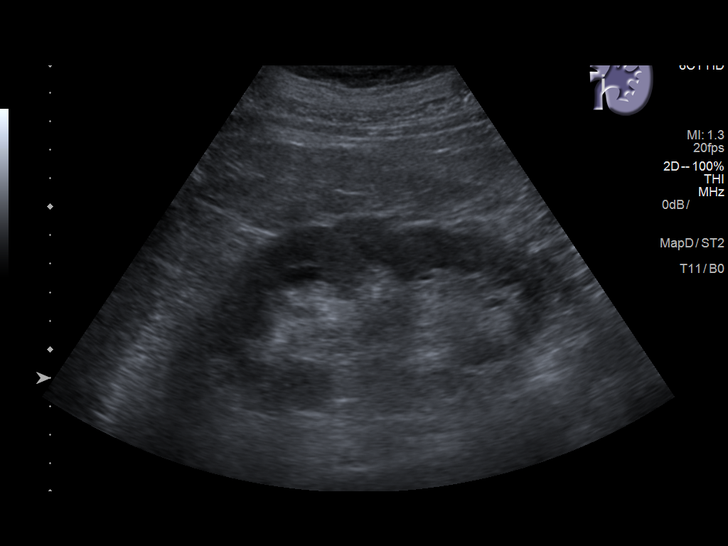
[im 28/37]
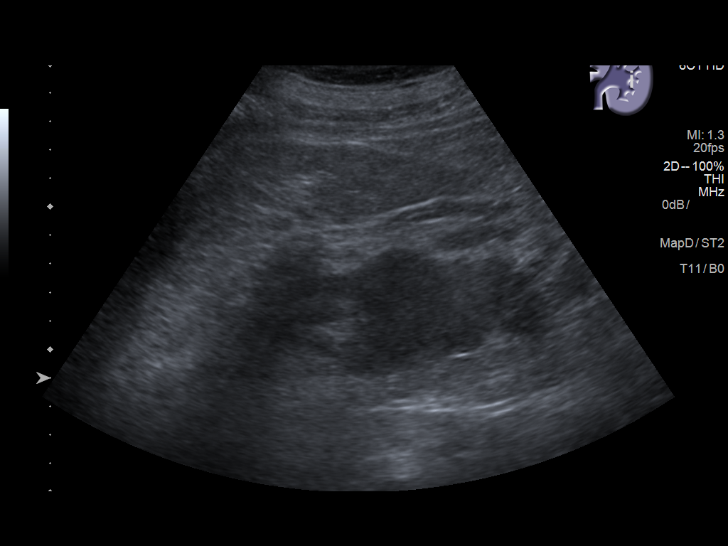
[im 31/37]
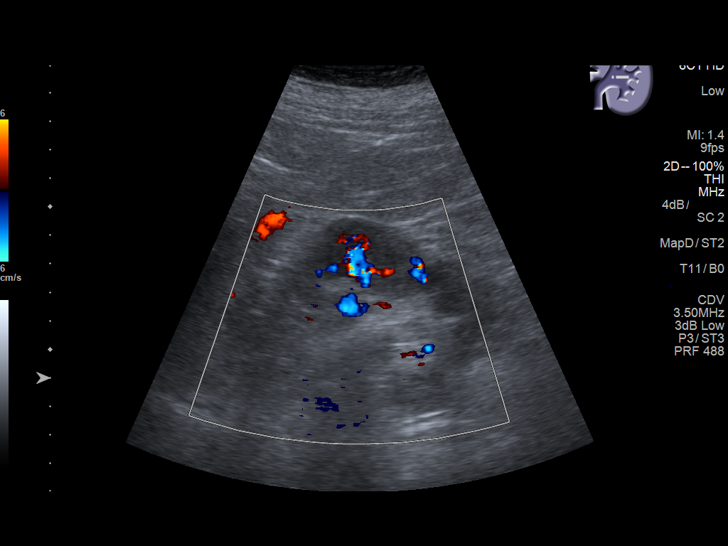
[im 34/37]
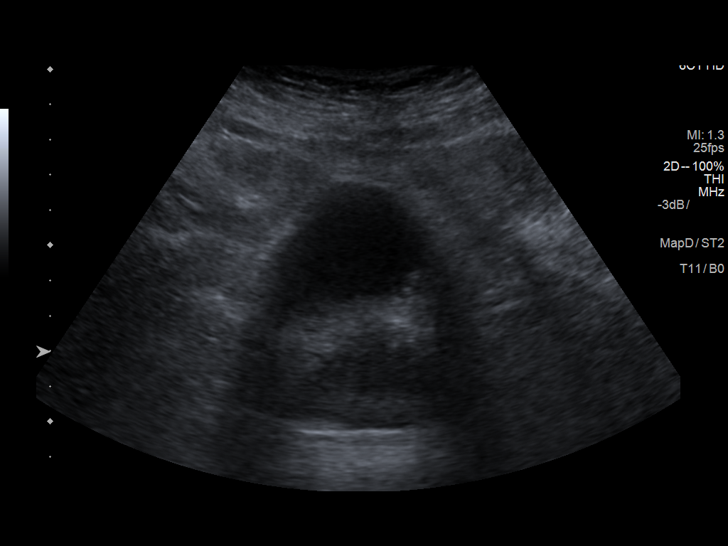
[im 37/37]
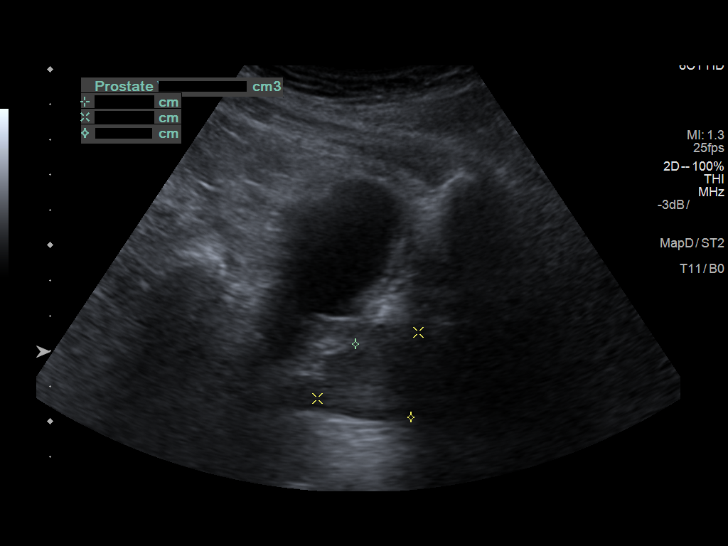

[14 of 25 positions shown; findings below may reference images not displayed]

FINDINGS: Right Kidney:

Length: 12.9 cm. There is a 3.2 cm simple cyst in the lower pole. No
masses or obstruction identified.

Left Kidney:

Length: 12.8 cm. Echogenicity within normal limits. No mass or
hydronephrosis visualized.

Bladder:

Poorly distended limiting evaluation.  No abnormalities are noted.
IMPRESSION: 1. There is a simple cyst off the lower pole of the right kidney. No
other abnormalities identified on this study.

## 2019-09-19 ENCOUNTER — Other Ambulatory Visit: Payer: Self-pay

## 2019-09-19 ENCOUNTER — Ambulatory Visit (INDEPENDENT_AMBULATORY_CARE_PROVIDER_SITE_OTHER): Payer: Managed Care, Other (non HMO)

## 2019-09-19 ENCOUNTER — Ambulatory Visit (INDEPENDENT_AMBULATORY_CARE_PROVIDER_SITE_OTHER): Payer: Managed Care, Other (non HMO) | Admitting: Vascular Surgery

## 2019-09-19 ENCOUNTER — Encounter (INDEPENDENT_AMBULATORY_CARE_PROVIDER_SITE_OTHER): Payer: Self-pay | Admitting: Vascular Surgery

## 2019-09-19 VITALS — BP 160/83 | HR 88 | Resp 16 | Wt 196.4 lb

## 2019-09-19 DIAGNOSIS — I10 Essential (primary) hypertension: Secondary | ICD-10-CM | POA: Diagnosis not present

## 2019-09-19 DIAGNOSIS — I6523 Occlusion and stenosis of bilateral carotid arteries: Secondary | ICD-10-CM

## 2019-09-19 DIAGNOSIS — I70213 Atherosclerosis of native arteries of extremities with intermittent claudication, bilateral legs: Secondary | ICD-10-CM

## 2019-09-19 DIAGNOSIS — M7989 Other specified soft tissue disorders: Secondary | ICD-10-CM

## 2019-09-19 NOTE — Assessment & Plan Note (Signed)
His ABIs today are 0.96 on the left and 1.0 on the right with strong biphasic to triphasic waveforms and normal digital pressures bilaterally.  His perfusion is currently well intact.  Overall, his blood flow should be adequate for wound healing and any surgical procedures that would need to be done for his Charcot fracture.  Continue current medical regimen.  Recheck in 1 year.

## 2019-09-19 NOTE — Progress Notes (Signed)
MRN : [id number]  [name] [name] is a 59 y.o. ([birth year]) male who presents with chief complaint of  Chief Complaint  Patient presents with  . Follow-up    ultrasound follow up  .  History of Present Illness: Patient returns today in follow up of multiple vascular issues.  Since his last visit, which has been delayed for many extra months secondary to Covid restrictions and him dealing with other issues, he has been diagnosed with a Charcot fracture of the left foot.  He is in a walking boot today.  He is following with podiatry.  He has a long history of significant peripheral arterial disease with previous interventions bilaterally.  His ABIs today are 0.96 on the left and 1.0 on the right with strong biphasic to triphasic waveforms and normal digital pressures bilaterally.  His perfusion is currently well intact. He is also evaluated for his carotid disease today.  He does not have any focal neurologic symptoms. Carotid duplex reveals stable, 1 to 39% ICA stenosis bilaterally.  Doing well with no current symptoms. His leg swelling is better as well.  He has not really had to use his pump.  He is now attributing much of the foot and ankle swelling to the Charcot fracture.  Current Outpatient Medications  Medication Sig Dispense Refill  . amLODipine (NORVASC) 10 MG tablet Take 10 mg by mouth daily.    Marland Kitchen aspirin EC 81 MG tablet Take 81 mg by mouth daily.    . clopidogrel (PLAVIX) 75 MG tablet Take 75 mg by mouth daily.    . metoprolol (TOPROL-XL) 200 MG 24 hr tablet Take 200 mg by mouth daily.     . Multiple Vitamin (MULTIVITAMIN WITH MINERALS) TABS tablet Take 1 tablet by mouth daily.    Marland Kitchen omega-3 acid ethyl esters (LOVAZA) 1 G capsule Take 4 g by mouth daily.     . pantoprazole (PROTONIX) 40 MG tablet Take 40 mg by mouth daily.    . rosuvastatin (CRESTOR) 40 MG tablet Take 40 mg by mouth daily.      No current facility-administered medications for this visit.    Past Medical  History:  Diagnosis Date  . Anemia    pernicious anemia  . Chest pain    Unspecified  . Chronic kidney disease   . Dysrhythmia    pac's.  okay now.  Marland Kitchen GERD (gastroesophageal reflux disease)   . Hypertension   . Hypertriglyceridemia   . Kidney stone on left side    2018  . Palpitations   . Peripheral vascular disease ([place]) 2019  . Pre-diabetes   . Thyroid nodule 2019   being monitored    Past Surgical History:  Procedure Laterality Date  . CYSTOSCOPY W/ RETROGRADES Bilateral 03/23/2017   Procedure: CYSTOSCOPY WITH RETROGRADE PYELOGRAM;  Surgeon: Abbie Sons, MD;  Location: [facility];  Service: Urology;  Laterality: Bilateral;  . CYSTOSCOPY W/ URETERAL STENT PLACEMENT Left 03/31/2017   Procedure: CYSTOSCOPY WITH STENT REPLACEMENT;  Surgeon: Abbie Sons, MD;  Location: [facility];  Service: Urology;  Laterality: Left;  . CYSTOSCOPY/RETROGRADE/URETEROSCOPY/STONE EXTRACTION WITH BASKET Left 03/31/2017   Procedure: CYSTOSCOPY/RETROGRADE/URETEROSCOPY/STONE EXTRACTION WITH BASKET;  Surgeon: Abbie Sons, MD;  Location: [facility];  Service: Urology;  Laterality: Left;  . CYSTOSCOPY/URETEROSCOPY/HOLMIUM LASER/STENT PLACEMENT Left 03/23/2017   Procedure: CYSTOSCOPY/URETEROSCOPY/HOLMIUM LASER/STENT PLACEMENT;  Surgeon: Abbie Sons, MD;  Location: [facility];  Service: Urology;  Laterality: Left;  . ESOPHAGOGASTRODUODENOSCOPY (EGD) WITH PROPOFOL N/A 10/22/2015  Procedure: ESOPHAGOGASTRODUODENOSCOPY (EGD) WITH PROPOFOL;  Surgeon: Lucilla Lame, MD;  Location: [facility];  Service: Endoscopy;  Laterality: N/A;  . EXTRACORPOREAL SHOCK WAVE LITHOTRIPSY  2016  . EXTRACORPOREAL SHOCK WAVE LITHOTRIPSY Left 09/13/2014   Procedure: EXTRACORPOREAL SHOCK WAVE LITHOTRIPSY (ESWL);  Surgeon: Hollice Espy, MD;  Location: [facility];  Service: Urology;  Laterality: Left;  . LOWER EXTREMITY ANGIOGRAPHY Left 07/15/2017   Procedure: LOWER EXTREMITY ANGIOGRAPHY;  Surgeon: Algernon Huxley, MD;  Location:  [facility];  Service: Cardiovascular;  Laterality: Left;  . lower extremity interventions x2 right leg Right    stent  . PERIPHERAL VASCULAR CATHETERIZATION Right 08/20/2014   Procedure: Lower Extremity Angiography;  Surgeon: Algernon Huxley, MD;  Location: [facility];  Service: Cardiovascular;  Laterality: Right;  . PERIPHERAL VASCULAR CATHETERIZATION Right 08/20/2014   Procedure: Lower Extremity Intervention;  Surgeon: Algernon Huxley, MD;  Location: [facility];  Service: Cardiovascular;  Laterality: Right;  . VENTRAL HERNIA REPAIR  08/11/2019   Procedure: HERNIA REPAIR VENTRAL ADULT WITH REPAIR OF LARGE INTESTINE LACERATION;  Surgeon: Herbert Pun, MD;  Location: [facility];  Service: General;;  . XI ROBOTIC ASSISTED VENTRAL HERNIA N/A 08/11/2019   Procedure: XI ROBOTIC ASSISTED VENTRAL HERNIA;  Surgeon: Herbert Pun, MD;  Location: [facility];  Service: General;  Laterality: N/A;  Converted to open procedure     Social History   Tobacco Use  . Smoking status: Former Smoker    Packs/day: 1.00    Years: 37.00    Pack years: 37.00    Quit date: 04/21/2013    Years since quitting: 6.4  . Smokeless tobacco: Never Used  Vaping Use  . Vaping Use: Never used  Substance Use Topics  . Alcohol use: Yes    Alcohol/week: 14.0 standard drinks    Types: 7 Glasses of wine, 7 Cans of beer per week    Comment: trying to cut back on daily consumption  . Drug use: No      Family History  Problem Relation Age of Onset  . Heart failure Other   No bleeding disorders, clotting disorders, or aneurysms   No Known Allergies   REVIEW OF SYSTEMS(Negative unless checked)  Constitutional: [] ??Weight loss [] ??Fever [] ??Chills Cardiac: [] ??Chest pain [] ??Chest pressure [] ??Palpitations [] ??Shortness of breath when laying flat [] ??Shortness of breath at rest [] ??Shortness of breath with exertion. Vascular: [x] ??Pain in legs with walking [] ??Pain in  legs at rest [] ??Pain in legs when laying flat [] ??Claudication [] ??Pain in feet when walking [] ??Pain in feet at rest [] ??Pain in feet when laying flat [] ??History of DVT [] ??Phlebitis [x] ??Swelling in legs [] ??Varicose veins [] ??Non-healing ulcers Pulmonary: [] ??Uses home oxygen [] ??Productive cough [] ??Hemoptysis [] ??Wheeze [] ??COPD [] ??Asthma Neurologic: [] ??Dizziness [] ??Blackouts [] ??Seizures [] ??History of stroke [] ??History of TIA [] ??Aphasia [] ??Temporary blindness [] ??Dysphagia [] ??Weakness or numbness in arms [] ??Weakness or numbness in legs Musculoskeletal: [x] ??Arthritis [] ??Joint swelling [] ??Joint pain [] ??Low back pain Hematologic: [] ??Easy bruising [] ??Easy bleeding [] ??Hypercoagulable state [x] ??Anemic  Gastrointestinal: [] ??Blood in stool [] ??Vomiting blood [x] ??Gastroesophageal reflux/heartburn [] ??Abdominal pain Genitourinary: [x] ??Chronic kidney disease [] ??Difficult urination [] ??Frequent urination [] ??Burning with urination [] ??Hematuria Skin: [] ??Rashes [] ??Ulcers [] ??Wounds Psychological: [] ??History of anxiety [] ??History of major depression.  Physical Examination  BP (!) 160/83 (BP Location: Right Arm)   Pulse 88   Resp 16   Wt 196 lb 6.4 oz (89.1 kg)   BMI 26.64 kg/m  Gen:  WD/WN, NAD Head: [place]/AT, No temporalis wasting. Ear/Nose/Throat: Hearing grossly intact, nares w/o erythema or drainage Eyes: Conjunctiva clear. Sclera non-icteric Neck: Supple.  Trachea midline.  No bruit Pulmonary:  Good air movement, no use of accessory muscles.  Cardiac: RRR, no JVD Vascular:  Vessel Right Left  Radial Palpable Palpable                          PT Palpable Palpable  DP Palpable Palpable   Gastrointestinal: soft, non-tender/non-distended. No guarding/reflex.  Musculoskeletal: M/S 5/5 throughout.  No deformity or atrophy.  In a walking boot on the left foot.  Trace left lower  extremity edema. Neurologic: Sensation grossly intact in extremities.  Symmetrical.  Speech is fluent.  Psychiatric: Judgment intact, Mood & affect appropriate for pt's clinical situation. Dermatologic: No rashes or ulcers noted.  No cellulitis or open wounds.       Labs Recent Results (from the past 2160 hour(s))  SARS CORONAVIRUS 2 (TAT 6-24 HRS) Nasopharyngeal Nasopharyngeal Swab     Status: None   Collection Time: 08/09/19  9:41 AM   Specimen: Nasopharyngeal Swab  Result Value Ref Range   SARS Coronavirus 2 NEGATIVE NEGATIVE    Comment: (NOTE) SARS-CoV-2 target nucleic acids are NOT DETECTED. The SARS-CoV-2 RNA is generally detectable in upper and lower respiratory specimens during the acute phase of infection. Negative results do not preclude SARS-CoV-2 infection, do not rule out co-infections with other pathogens, and should not be used as the sole basis for treatment or other patient management decisions. Negative results must be combined with clinical observations, patient history, and epidemiological information. The expected result is Negative. Fact Sheet for Patients: SugarRoll.be Fact Sheet for Healthcare Providers: https://www.woods-mathews.com/ This test is not yet approved or cleared by the [place] FDA and  has been authorized for detection and/or diagnosis of SARS-CoV-2 by FDA under an Emergency Use Authorization (EUA). This EUA will remain  in effect (meaning this test can be used) for the duration of the COVID-19 declaration under Section 56 4(b)(1) of the Act, 21 U.S.C. section 360bbb-3(b)(1), unless the authorization is terminated or revoked sooner. Performed at [facility], [place] [address] [place], [place] [address]   Surgical pathology     Status: None   Collection Time: 08/11/19  2:33 PM  Result Value Ref Range   SURGICAL PATHOLOGY      SURGICAL PATHOLOGY CASE: [id number] PATIENT: Bertram Millard Surgical Pathology Report     Specimen Submitted: A. Hernia sac contents B. Greater omentum  Clinical History: Supraumbilical ventral hernia, incarcerated     DIAGNOSIS: A. VENTRAL HERNIA CONTENTS; HERNIA REPAIR: - BENIGN ADIPOSE TISSUE WITH AREAS OF FAT NECROSIS, REACTIVE FIBROSIS, AND FOCAL FIBRINOUS EXUDATE.  B. GREATER OMENTUM; PARTIAL RESECTION: - BENIGN ADIPOSE TISSUE CONSISTENT WITH OMENTUM.  GROSS DESCRIPTION: A. Labeled: Hernia sac contents Received: In formalin Tissue fragment(s): 1 Size: 5.3 x 3 x 2.5 cm Description: A piece of gray-pink yellow fibrofatty and fibromembranous tissue, sectioned Representative sections submitted in 1 cassette.  B. Labeled: Greater Omentum Received: In formalin Tissue fragment(s): 1 Size: 29 x 15 x 2 cm Description: A piece of pink-red to yellow omental fat which on serial sectioning does not reveal any lesions or nodularities Representative  sections submitted in 1 cassette.   Final Diagnosis performed by Bryan Lemma, MD.   Electronically signed 08/15/2019 1:24:29PM The electronic signature indicates that the named Attending Pathologist has evaluated the specimen Technical component performed at [facility], [address] [place],  [address] Lab: [phone] Dir: Rush Farmer, MD, MMM  Professional component performed at [facility], [facility],  [place], [place], [place] [address] Lab: [phone] Dir: Dellia Nims. Rubinas, MD   Prepare RBC (crossmatch)     Status: None   Collection Time: 08/11/19  3:39 PM  Result Value Ref Range   Order Confirmation      ORDER PROCESSED BY BLOOD BANK Performed at [facility], [address] [place], [place] [address]   Type and screen     Status: None   Collection Time: 08/11/19  4:44 PM  Result Value Ref Range   ABO/RH(D) O POS    Antibody Screen NEG    Sample Expiration 08/14/2019,2359    Unit Number [id number]    Blood Component  Type RED CELLS,LR    Unit division 00    Status of Unit REL FROM [facility]    Transfusion Status OK TO TRANSFUSE    Crossmatch Result      Compatible Performed at [facility], [address] [place], [place] [address]    Unit Number [id number]    Blood Component Type RED CELLS,LR    Unit division 00    Status of Unit REL FROM [facility]    Transfusion Status OK TO TRANSFUSE    Crossmatch Result Compatible   BPAM RBC     Status: None   Collection Time: 08/11/19  4:44 PM  Result Value Ref Range   Blood Product Unit Number [id number]    PRODUCT CODE G1829H37    Unit Type and Rh 5100    Blood Product Expiration Date 169678938101    Blood Product Unit Number [id number]    PRODUCT CODE E0382V00    Unit Type and Rh 5100    Blood Product Expiration Date 423536144315   CBC     Status: Abnormal   Collection Time: 08/11/19  4:45 PM  Result Value Ref Range   WBC 14.0 (H) 4.0 - 10.5 K/uL   RBC 3.84 (L) 4.22 - 5.81 MIL/uL   Hemoglobin 13.0 13.0 - 17.0 g/dL   HCT 38.1 (L) 39 - 52 %   MCV 99.2 80.0 - 100.0 fL   MCH 33.9 26.0 - 34.0 pg   MCHC 34.1 30.0 - 36.0 g/dL   RDW 12.5 11.5 - 15.5 %   Platelets 120 (L) 150 - 400 K/uL    Comment: Immature Platelet Fraction may be clinically indicated, consider ordering this additional test [id number]    nRBC 0.0 0.0 - 0.2 %    Comment: Performed at [facility], [address] [place], [place] [address]  Creatinine, serum     Status: None   Collection Time: 08/11/19  4:45 PM  Result Value Ref Range   Creatinine, Ser 1.11 0.61 - 1.24 mg/dL   GFR calc non Af Amer >60 >60 mL/min   GFR calc Af Amer >60 >60 mL/min    Comment: Performed at [facility], [address], [place], [place] [address]    Radiology No results found.  Assessment/Plan Benign essential HTN blood pressure control important in reducing the progression of atherosclerotic disease. On appropriate oral medications.   Carotid  stenosis Carotid duplex reveals stable, 1 to 39% ICA stenosis bilaterally.  Doing well with no current symptoms.  On appropriate medical therapy with aspirin, statin, and Plavix.  Recheck annually.  Swelling of limb Stable. Does have a lymphedema pump and has been wearing compression stockings. Appears better at this point and may have been related to the metatarsal fracture more than actual lymphedema or venous disease.  Does not have  to use his lymphedema pump all that frequently at this point..  Atherosclerosis of native arteries of extremity with intermittent claudication (HCC) His ABIs today are 0.96 on the left and 1.0 on the right with strong biphasic to triphasic waveforms and normal digital pressures bilaterally.  His perfusion is currently well intact.  Overall, his blood flow should be adequate for wound healing and any surgical procedures that would need to be done for his Charcot fracture.  Continue current medical regimen.  Recheck in 1 year.    Leotis Pain, MD  09/19/2019 3:35 PM    This note was created with Dragon medical transcription system.  Any errors from dictation are purely unintentional

## 2019-09-19 NOTE — Patient Instructions (Signed)
Peripheral Vascular Disease  Peripheral vascular disease (PVD) is a disease of the blood vessels that are not part of your heart and brain. A simple term for PVD is poor circulation. In most cases, PVD narrows the blood vessels that carry blood from your heart to the rest of your body. This can reduce the supply of blood to your arms, legs, and internal organs, like your stomach or kidneys. However, PVD most often affects a person's lower legs and feet. Without treatment, PVD tends to get worse. PVD can also lead to acute ischemic limb. This is when an arm or leg suddenly cannot get enough blood. This is a medical emergency. Follow these instructions at home: Lifestyle  Do not use any products that contain nicotine or tobacco, such as cigarettes and e-cigarettes. If you need help quitting, ask your doctor.  Lose weight if you are overweight. Or, stay at a healthy weight as told by your doctor.  Eat a diet that is low in fat and cholesterol. If you need help, ask your doctor.  Exercise regularly. Ask your doctor for activities that are right for you. General instructions  Take over-the-counter and prescription medicines only as told by your doctor.  Take good care of your feet: ? Wear comfortable shoes that fit well. ? Check your feet often for any cuts or sores.  Keep all follow-up visits as told by your doctor This is important. Contact a doctor if:  You have cramps in your legs when you walk.  You have leg pain when you are at rest.  You have coldness in a leg or foot.  Your skin changes.  You are unable to get or have an erection (erectile dysfunction).  You have cuts or sores on your feet that do not heal. Get help right away if:  Your arm or leg turns cold, numb, and blue.  Your arms or legs become red, warm, swollen, painful, or numb.  You have chest pain.  You have trouble breathing.  You suddenly have weakness in your face, arm, or leg.  You become very  confused or you cannot speak.  You suddenly have a very bad headache.  You suddenly cannot see. Summary  Peripheral vascular disease (PVD) is a disease of the blood vessels.  A simple term for PVD is poor circulation. Without treatment, PVD tends to get worse.  Treatment may include exercise, low fat and low cholesterol diet, and quitting smoking. This information is not intended to replace advice given to you by your health care provider. Make sure you discuss any questions you have with your health care provider. Document Revised: 03/05/2017 Document Reviewed: 04/30/2016 Elsevier Patient Education  2020 [employer]  

## 2019-09-21 ENCOUNTER — Ambulatory Visit
Admission: RE | Admit: 2019-09-21 | Discharge: 2019-09-21 | Disposition: A | Discharge status: Home / Self Care | Payer: Managed Care, Other (non HMO) | Source: Ambulatory Visit | Attending: Oncology | Admitting: Oncology

## 2019-09-21 ENCOUNTER — Other Ambulatory Visit: Payer: Self-pay

## 2019-09-21 DIAGNOSIS — Z87891 Personal history of nicotine dependence: Secondary | ICD-10-CM | POA: Diagnosis present

## 2019-09-21 DIAGNOSIS — Z122 Encounter for screening for malignant neoplasm of respiratory organs: Secondary | ICD-10-CM | POA: Insufficient documentation

## 2019-09-22 IMAGING — CT CT RENAL STONE PROTOCOL
2 of 4 series · 16 of 46 positions shown, 18 images · non-contrast
Comparison: CT abdomen pelvis 11/20/2015.

CLINICAL DATA: Patient with right-sided flank pain.  Hematuria.

EXAM:
CT ABDOMEN AND PELVIS WITHOUT CONTRAST
TECHNIQUE: Multidetector CT imaging of the abdomen and pelvis was performed
following the standard protocol without IV contrast.

[Series 2: stone full standard · axial · 0.89mm/px · z∈[-536,-141]mm · 13 of 87 slices shown, 15 images]
[im 4/87  soft-tissue]
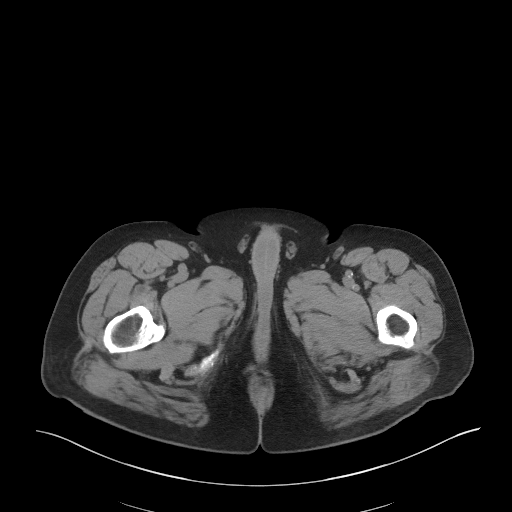
[im 4/87  bone]
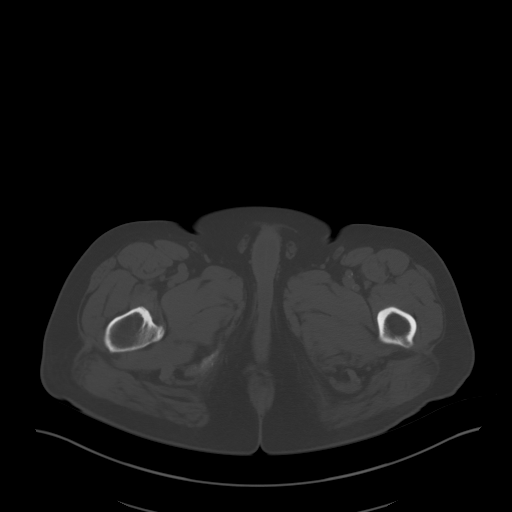
[im 11/87  soft-tissue]
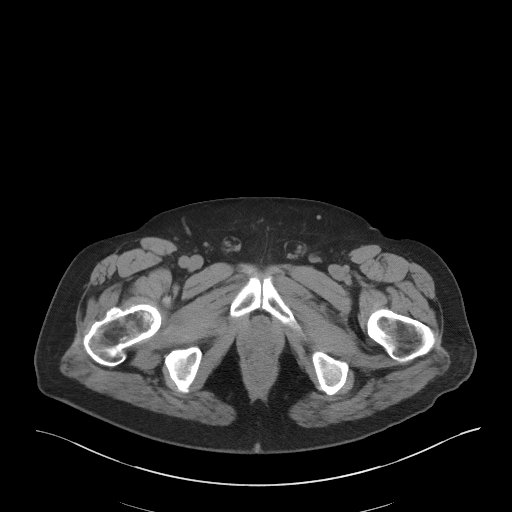
[im 18/87  soft-tissue]
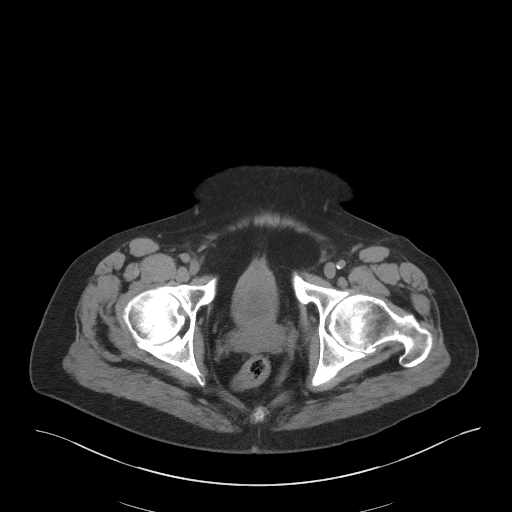
[im 25/87  soft-tissue]
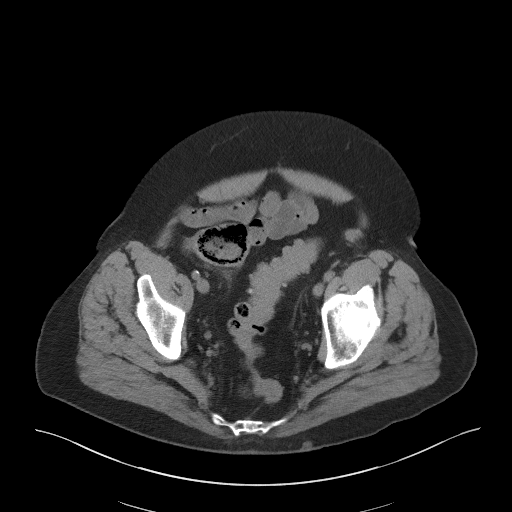
[im 31/87  soft-tissue]
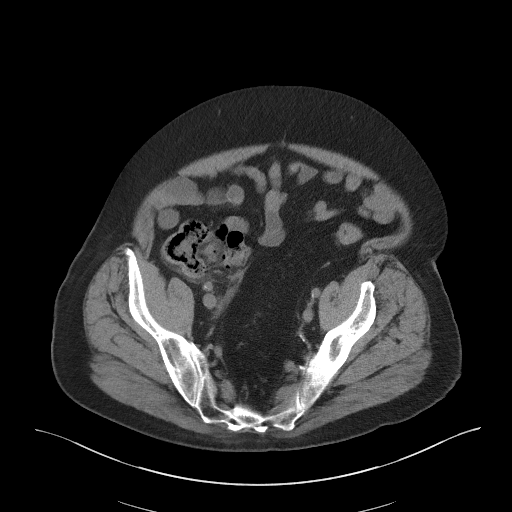
[im 38/87  soft-tissue]
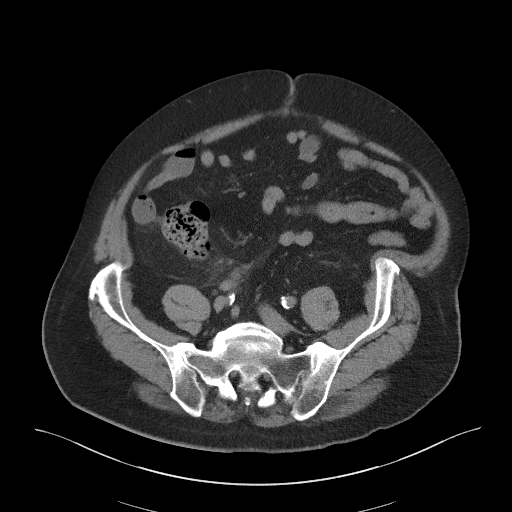
[im 45/87  soft-tissue]
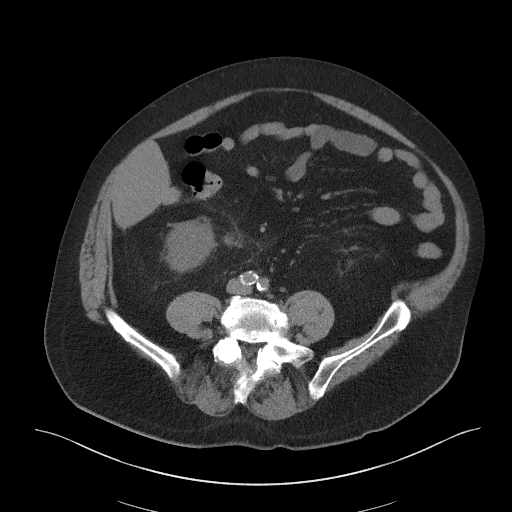
[im 49/87  soft-tissue]
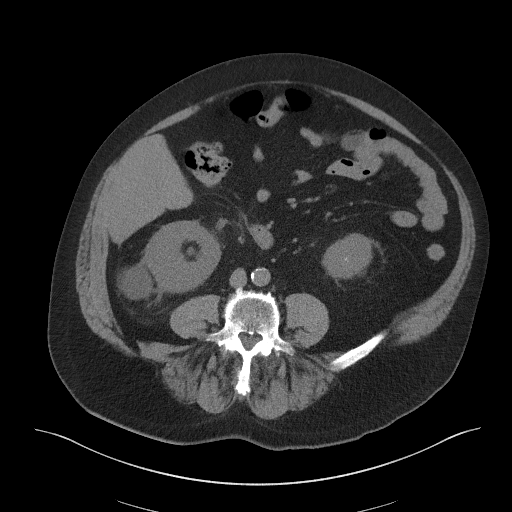
[im 56/87  soft-tissue]
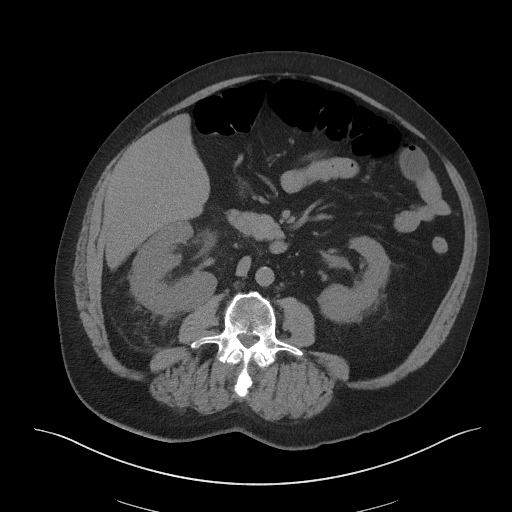
[im 56/87  bone]
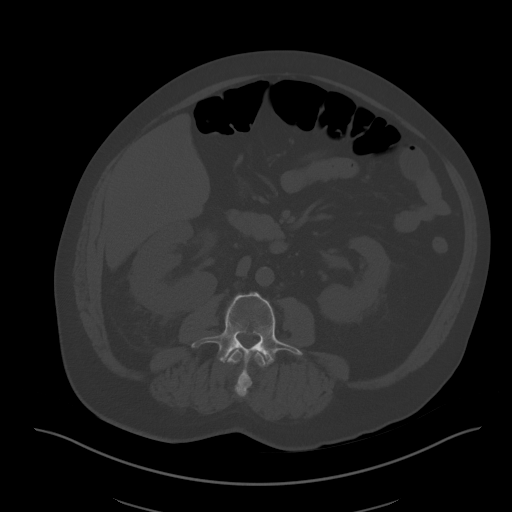
[im 62/87  soft-tissue]
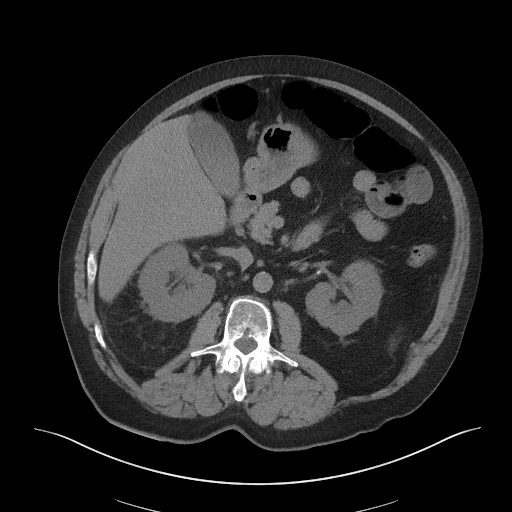
[im 69/87  soft-tissue]
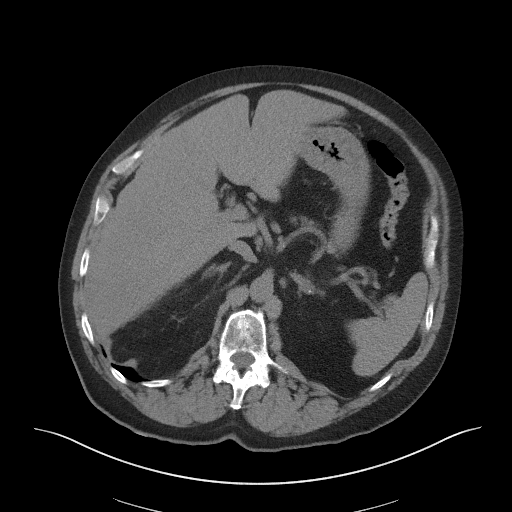
[im 76/87  soft-tissue]
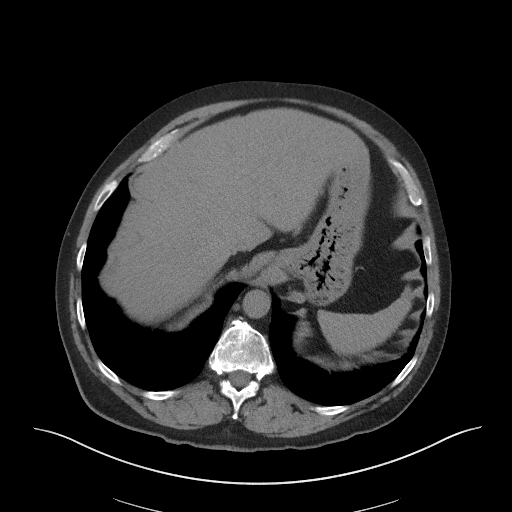
[im 83/87  soft-tissue]
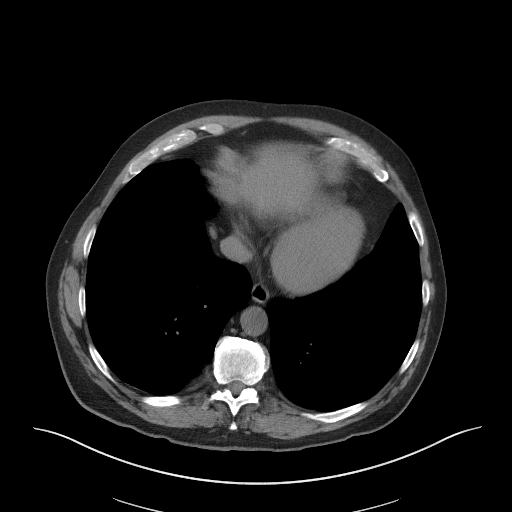

[Series 5: coronal · coronal · 0.78mm/px · 3 of 176 slices shown]
[im 59/176  soft-tissue]
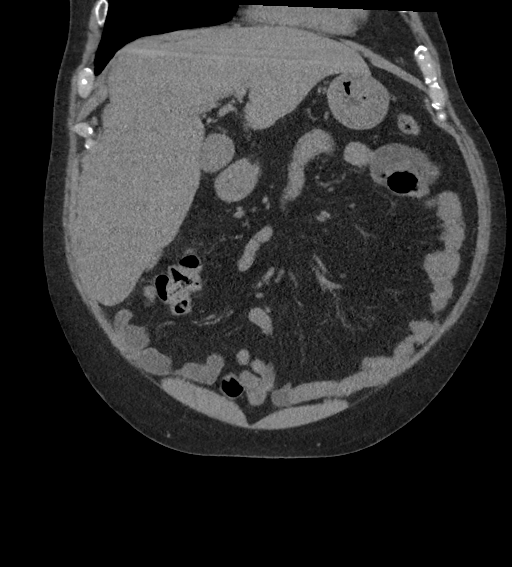
[im 78/176  soft-tissue]
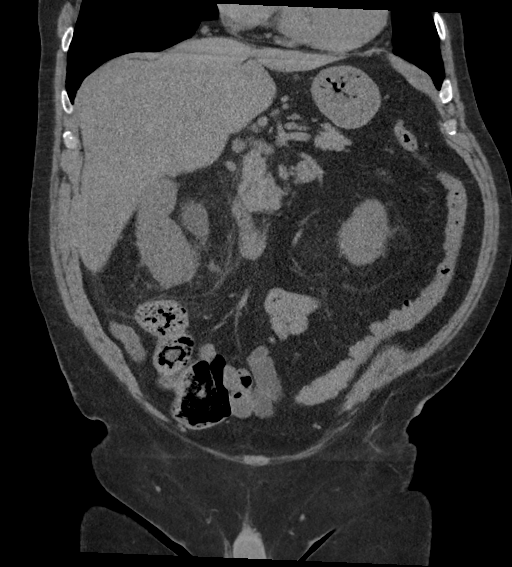
[im 98/176  soft-tissue]
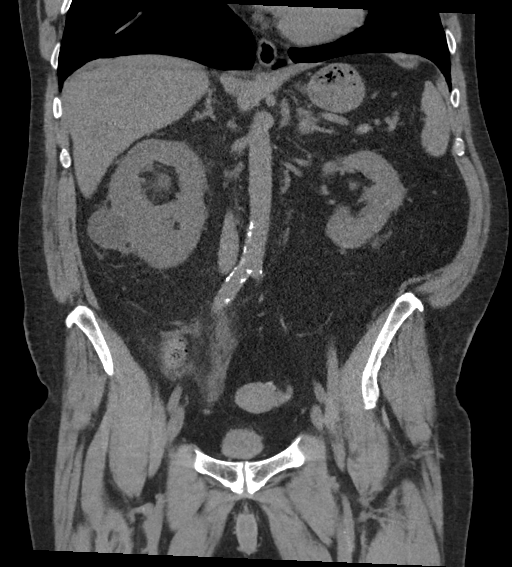

[16 of 46 positions shown; findings below may reference images not displayed]

FINDINGS: Lower chest: Normal heart size. Dependent atelectasis within the
lower lobes bilaterally. No pleural effusion.

Hepatobiliary: The liver is low in attenuation compatible with
steatosis. Sludge within the gallbladder lumen. No intrahepatic or
extrahepatic biliary ductal dilatation.

Pancreas: Unremarkable

Spleen: Unremarkable

Adrenals/Urinary Tract: Stable appearance of the adrenal glands.
Bilateral perinephric fat stranding. Multiple stones demonstrated
within the left kidney with a conglomerate of stones measuring up to
1.7 cm within the inferior pole (image 38; series 2). No left-sided
hydronephrosis.

There is moderate right hydroureteronephrosis to the level of the
urinary bladder/distal right ureter were there is an obstructing 4
mm stone at the UVJ (image 69; series 2). The urinary bladder is
relatively decompressed. There is a 3.9 cm exophytic cyst off the
interpolar region of the right kidney.

Stomach/Bowel: Descending and sigmoid colonic diverticulosis. No CT
evidence for acute diverticulitis. Normal appendix. Normal
morphology of the stomach. No free intraperitoneal air. Small amount
of fluid within the right pericolic gutter and pelvis.

Vascular/Lymphatic: Normal caliber abdominal aorta. Peripheral
calcified atherosclerotic plaque. No retroperitoneal
lymphadenopathy.

Reproductive: Prostate unremarkable.

Other: Bilateral fat containing inguinal hernias.

Musculoskeletal: Lumbar spine degenerative changes. No aggressive or
acute appearing osseous lesions.
IMPRESSION: 1. There is an obstructing 4 mm stone at the right UVJ resulting in
moderate right hydronephrosis.
2. Multiple nonobstructing left-sided renal stones.
3. Hepatic steatosis.

## 2019-09-26 ENCOUNTER — Encounter: Payer: Self-pay | Admitting: *Deleted

## 2019-11-15 ENCOUNTER — Ambulatory Visit (INDEPENDENT_AMBULATORY_CARE_PROVIDER_SITE_OTHER): Payer: Managed Care, Other (non HMO)

## 2019-11-15 ENCOUNTER — Ambulatory Visit (INDEPENDENT_AMBULATORY_CARE_PROVIDER_SITE_OTHER): Payer: Managed Care, Other (non HMO) | Admitting: Podiatry

## 2019-11-15 ENCOUNTER — Other Ambulatory Visit: Payer: Self-pay

## 2019-11-15 ENCOUNTER — Encounter: Payer: Self-pay | Admitting: Podiatry

## 2019-11-15 DIAGNOSIS — M14671 Charcot's joint, right ankle and foot: Secondary | ICD-10-CM | POA: Diagnosis not present

## 2019-11-15 DIAGNOSIS — L97521 Non-pressure chronic ulcer of other part of left foot limited to breakdown of skin: Secondary | ICD-10-CM | POA: Diagnosis not present

## 2019-11-15 NOTE — Progress Notes (Signed)
Subjective:  Patient ID: [name] [name], male    DOB: 11/08/1960,  MRN: [id number] HPI Chief Complaint  Patient presents with  . Foot Pain    Charcot left foot x 2 years, seeing Dr. Luana Shu at [facility], ulcer x 1 year, COVID kept him at home from professional treatment, using iodine and bandage at home, Dr Luana Shu has been debriding, tried 4 weeks of grafts and bandaging recently, would like 2nd opinion, wearing surgical shoe with offloading pad, did get a shoe made but still in process-haven't received it yet    59 y.o. male presents with the above complaint.   ROS: Denies fever chills nausea vomiting muscle aches pains calf pain back pain chest pain shortness of breath.  Past Medical History:  Diagnosis Date  . Anemia    pernicious anemia  . Chest pain    Unspecified  . Chronic gouty arthritis 12/26/2015  . Chronic kidney disease   . Dysrhythmia    pac's.  okay now.  Marland Kitchen GERD (gastroesophageal reflux disease)   . Hypertension   . Hypertriglyceridemia   . Kidney stone on left side    2018  . Palpitations   . Peripheral vascular disease ([place]) 2019  . Pre-diabetes   . Thyroid nodule 2019   being monitored   Past Surgical History:  Procedure Laterality Date  . CYSTOSCOPY W/ RETROGRADES Bilateral 03/23/2017   Procedure: CYSTOSCOPY WITH RETROGRADE PYELOGRAM;  Surgeon: Abbie Sons, MD;  Location: [facility];  Service: Urology;  Laterality: Bilateral;  . CYSTOSCOPY W/ URETERAL STENT PLACEMENT Left 03/31/2017   Procedure: CYSTOSCOPY WITH STENT REPLACEMENT;  Surgeon: Abbie Sons, MD;  Location: [facility];  Service: Urology;  Laterality: Left;  . CYSTOSCOPY/RETROGRADE/URETEROSCOPY/STONE EXTRACTION WITH BASKET Left 03/31/2017   Procedure: CYSTOSCOPY/RETROGRADE/URETEROSCOPY/STONE EXTRACTION WITH BASKET;  Surgeon: Abbie Sons, MD;  Location: [facility];  Service: Urology;  Laterality: Left;  . CYSTOSCOPY/URETEROSCOPY/HOLMIUM LASER/STENT PLACEMENT Left 03/23/2017   Procedure:  CYSTOSCOPY/URETEROSCOPY/HOLMIUM LASER/STENT PLACEMENT;  Surgeon: Abbie Sons, MD;  Location: [facility];  Service: Urology;  Laterality: Left;  . ESOPHAGOGASTRODUODENOSCOPY (EGD) WITH PROPOFOL N/A 10/22/2015   Procedure: ESOPHAGOGASTRODUODENOSCOPY (EGD) WITH PROPOFOL;  Surgeon: Lucilla Lame, MD;  Location: [facility];  Service: Endoscopy;  Laterality: N/A;  . EXTRACORPOREAL SHOCK WAVE LITHOTRIPSY  2016  . EXTRACORPOREAL SHOCK WAVE LITHOTRIPSY Left 09/13/2014   Procedure: EXTRACORPOREAL SHOCK WAVE LITHOTRIPSY (ESWL);  Surgeon: Hollice Espy, MD;  Location: [facility];  Service: Urology;  Laterality: Left;  . LOWER EXTREMITY ANGIOGRAPHY Left 07/15/2017   Procedure: LOWER EXTREMITY ANGIOGRAPHY;  Surgeon: Algernon Huxley, MD;  Location: [facility];  Service: Cardiovascular;  Laterality: Left;  . lower extremity interventions x2 right leg Right    stent  . PERIPHERAL VASCULAR CATHETERIZATION Right 08/20/2014   Procedure: Lower Extremity Angiography;  Surgeon: Algernon Huxley, MD;  Location: [facility];  Service: Cardiovascular;  Laterality: Right;  . PERIPHERAL VASCULAR CATHETERIZATION Right 08/20/2014   Procedure: Lower Extremity Intervention;  Surgeon: Algernon Huxley, MD;  Location: [facility];  Service: Cardiovascular;  Laterality: Right;  . VENTRAL HERNIA REPAIR  08/11/2019   Procedure: HERNIA REPAIR VENTRAL ADULT WITH REPAIR OF LARGE INTESTINE LACERATION;  Surgeon: Herbert Pun, MD;  Location: [facility];  Service: General;;  . XI ROBOTIC ASSISTED VENTRAL HERNIA N/A 08/11/2019   Procedure: XI ROBOTIC ASSISTED VENTRAL HERNIA;  Surgeon: Herbert Pun, MD;  Location: [facility];  Service: General;  Laterality: N/A;  Converted to open procedure    Current  Outpatient Medications:  .  amLODipine (NORVASC) 10 MG tablet, Take 10 mg by mouth daily., Disp: , Rfl:  .  aspirin EC 81 MG tablet, Take 81 mg by mouth daily., Disp: , Rfl:  .  clopidogrel (PLAVIX) 75 MG tablet, Take  75 mg by mouth daily., Disp: , Rfl:  .  metoprolol (TOPROL-XL) 200 MG 24 hr tablet, Take 200 mg by mouth daily. , Disp: , Rfl:  .  Multiple Vitamin (MULTIVITAMIN WITH MINERALS) TABS tablet, Take 1 tablet by mouth daily., Disp: , Rfl:  .  omega-3 acid ethyl esters (LOVAZA) 1 G capsule, Take 4 g by mouth daily. , Disp: , Rfl:  .  pantoprazole (PROTONIX) 40 MG tablet, Take 40 mg by mouth daily., Disp: , Rfl:  .  rosuvastatin (CRESTOR) 40 MG tablet, Take 40 mg by mouth daily. , Disp: , Rfl:   No Known Allergies Review of Systems Objective:  There were no vitals filed for this visit.  General: Well developed, nourished, in no acute distress, alert and oriented x3   Dermatological: Skin is warm, dry and supple bilateral. Nails x 10 are well maintained; remaining integument appears unremarkable at this time. There are no open sores, no preulcerative lesions, no rash or signs of infection present.  Ulcerative lesion some first metatarsal base and medial cuneiform area.  This is secondary to Charcot.  Open ulceration no erythema cellulitis drainage or odor at this point.  Vascular: Dorsalis Pedis artery and Posterior Tibial artery pedal pulses are 1/4 bilateral with immedate capillary fill time. Pedal hair growth present. No varicosities and no lower extremity edema present bilateral.  History of SFA stents  Neruologic: Grossly intact via light touch bilateral. Vibratory intact via tuning fork bilateral. Protective threshold with Semmes Wienstein monofilament diminished to all pedal sites bilateral. Patellar and Achilles deep tendon reflexes 2+ bilateral. No Babinski or clonus noted bilateral.  History of B12 neuropathy  Musculoskeletal: No gross boney pedal deformities bilateral. No pain, crepitus, or limitation noted with foot and ankle range of motion bilateral. Muscular strength 5/5 in all groups tested bilateral.  Flatfoot deformity with protrusion of the medial and plantar medial first metatarsal  medial cuneiform joint with midfoot break.  Gait: Unassisted, Nonantalgic.    Radiographs:  Radiographs taken today demonstrate osseous abnormalities and what appears to be a Charcot breakdown at the tarsometatarsal joints and a collapse of the midfoot.  I do not see any signs of osteomyelitis.  Assessment & Plan:   Assessment: Discussed etiology pathology conservative surgical therapies.  Charcot arthropathy with an ulceration.  Plan: Discussed etiology pathology and surgical therapies at this point I will address the wound today and look good most likely just been cleaned.  Demonstrated some ways to help offload this with padding and taping.  I also wrote down the name of knee scooter for him so that he could offload this so that it we going to heal prior to his new shoes coming.  We did discuss the possible need for surgery and I would like to go ahead and follow-up with him regularly until we can get this close to healed and then consider surgical intervention with one of the associates.     Camaria Gerald T. [place], [place]

## 2019-11-23 ENCOUNTER — Encounter: Payer: Self-pay | Admitting: Podiatry

## 2019-12-06 ENCOUNTER — Encounter: Payer: Self-pay | Admitting: Podiatry

## 2019-12-06 ENCOUNTER — Ambulatory Visit (INDEPENDENT_AMBULATORY_CARE_PROVIDER_SITE_OTHER): Payer: Managed Care, Other (non HMO) | Admitting: Podiatry

## 2019-12-06 ENCOUNTER — Other Ambulatory Visit: Payer: Self-pay

## 2019-12-06 DIAGNOSIS — L97521 Non-pressure chronic ulcer of other part of left foot limited to breakdown of skin: Secondary | ICD-10-CM | POA: Diagnosis not present

## 2019-12-06 DIAGNOSIS — B351 Tinea unguium: Secondary | ICD-10-CM

## 2019-12-06 DIAGNOSIS — M79676 Pain in unspecified toe(s): Secondary | ICD-10-CM | POA: Diagnosis not present

## 2019-12-06 DIAGNOSIS — M14671 Charcot's joint, right ankle and foot: Secondary | ICD-10-CM

## 2019-12-06 NOTE — Progress Notes (Signed)
He presents today for follow-up of ulceration medial aspect of the left foot status post Charcot deformity.  He also like to have his nails trimmed.  He states that the ulceration has been treating daily with antibiotic solution iodine and Neosporin.  He states that it seems to be some better than it was.  Objective: Vital signs are stable he is alert oriented x3 pulses remain strong and palpable.  He has the prominent first metatarsal medial cuneiform joint area plantarly that is resulting in skin breakdown and ulceration.  After debridement today it only measures about 3 mm in diameter there is no purulence no malodor it does not probe deep at all.  This appears to be healing very nicely after sharp debridement previously.  His toenails are long thick yellow dystrophic-like mycotic particularly the left foot right foot is not as bad.  Assessment: Diabetes mellitus diabetic peripheral neuropathy angiopathy also pain in limb secondary to onychomycosis.  Diabetic ulceration.  Plan: Debrided ulceration today dressed a compressive dressing with Iodosorb.  I also recommend he continue his daily dressing changes.  I debrided his toenails 1 through 5 bilaterally.  He will follow up with Liliane Channel in about 2 weeks at which time I will follow-up with him as well.

## 2019-12-20 ENCOUNTER — Ambulatory Visit (INDEPENDENT_AMBULATORY_CARE_PROVIDER_SITE_OTHER): Payer: Managed Care, Other (non HMO) | Admitting: Podiatry

## 2019-12-20 ENCOUNTER — Encounter: Payer: Self-pay | Admitting: Podiatry

## 2019-12-20 ENCOUNTER — Other Ambulatory Visit: Payer: Self-pay

## 2019-12-20 ENCOUNTER — Ambulatory Visit: Payer: Managed Care, Other (non HMO) | Admitting: Orthotics

## 2019-12-20 DIAGNOSIS — L97521 Non-pressure chronic ulcer of other part of left foot limited to breakdown of skin: Secondary | ICD-10-CM

## 2019-12-20 DIAGNOSIS — M14671 Charcot's joint, right ankle and foot: Secondary | ICD-10-CM

## 2019-12-20 NOTE — Progress Notes (Signed)
He presents today for chronic ulceration subfirst TMT J left foot.  He states that it feels like it is about the same.  Objective: Vital signs are stable he is alert and oriented x3.  Pulses are palpable.  Reactive hyperkeratotic tissue overlying the wound was debrided demonstrates no erythema cellulitis drainage or odor no purulence does not probe deep.  Wound size measures about 3 mm in length improved from last visit.  Assessment: Slowly healing ulcerative lesion status post Charcot left.  Plan: Regular try debridement again today with offloading he also Renae Fickle and Liliane Channel is going to try different approach to help offload this foot.  I will follow-up with him once this comes in oriented about the next 3 weeks for redebridement.

## 2019-12-20 NOTE — Progress Notes (Signed)
Cast today for Mezzo brace with bubble medial cuniform/met base to take pressure off painful charcot deformity.

## 2020-01-10 ENCOUNTER — Other Ambulatory Visit: Payer: Self-pay

## 2020-01-10 ENCOUNTER — Ambulatory Visit (INDEPENDENT_AMBULATORY_CARE_PROVIDER_SITE_OTHER): Payer: Managed Care, Other (non HMO) | Admitting: Podiatry

## 2020-01-10 ENCOUNTER — Other Ambulatory Visit: Payer: Managed Care, Other (non HMO) | Admitting: Orthotics

## 2020-01-10 DIAGNOSIS — M14671 Charcot's joint, right ankle and foot: Secondary | ICD-10-CM | POA: Diagnosis not present

## 2020-01-10 DIAGNOSIS — L97521 Non-pressure chronic ulcer of other part of left foot limited to breakdown of skin: Secondary | ICD-10-CM

## 2020-01-10 NOTE — Progress Notes (Signed)
He presents today for follow-up of his ulceration of Charcot deformity of the left foot.  He states that feel like he is doing pretty well he presents today with his padding.  Objective: Vital signs are stable alert oriented x3 padding was removed demonstrates a bulging lesion plantar aspect of the medial longitudinal arch left once debrided all of the reactive hyperkeratotic tissue was noted there was completely healed there was closed wounds there is no purulence no drainage no serosanguineous drainage no erythema cellulitis drainage or odor.  Assessment: Charcot deformity with chronic ulceration and skin breakdown that has gone on to heal.  Plan: I recommended that he continue to pad this time continuously and I will follow-up with him on an 3-week basis.  Hopefully his orthotics will be in by that point.

## 2020-02-07 ENCOUNTER — Ambulatory Visit (INDEPENDENT_AMBULATORY_CARE_PROVIDER_SITE_OTHER): Payer: Managed Care, Other (non HMO) | Admitting: Orthotics

## 2020-02-07 ENCOUNTER — Ambulatory Visit (INDEPENDENT_AMBULATORY_CARE_PROVIDER_SITE_OTHER): Payer: Managed Care, Other (non HMO) | Admitting: Podiatry

## 2020-02-07 ENCOUNTER — Other Ambulatory Visit: Payer: Self-pay

## 2020-02-07 ENCOUNTER — Encounter: Payer: Self-pay | Admitting: Podiatry

## 2020-02-07 DIAGNOSIS — M14671 Charcot's joint, right ankle and foot: Secondary | ICD-10-CM

## 2020-02-07 DIAGNOSIS — L97521 Non-pressure chronic ulcer of other part of left foot limited to breakdown of skin: Secondary | ICD-10-CM | POA: Diagnosis not present

## 2020-02-07 DIAGNOSIS — M79676 Pain in unspecified toe(s): Secondary | ICD-10-CM | POA: Diagnosis not present

## 2020-02-07 DIAGNOSIS — B351 Tinea unguium: Secondary | ICD-10-CM | POA: Diagnosis not present

## 2020-02-07 DIAGNOSIS — D689 Coagulation defect, unspecified: Secondary | ICD-10-CM | POA: Diagnosis not present

## 2020-02-07 NOTE — Progress Notes (Signed)
Patient came in today to pick up standard mezzo brace to offload navicular/cunifirom deformity. .  Patient was evaluated for fit and function.   The brace fit very well and there were any complaints of the way it felt once donned.  tient advised to always wear proper fitting shoes with brace.  Patient advised of cost if [employer] didn't cover and signed ABN

## 2020-02-07 NOTE — Progress Notes (Signed)
He presents today chief complaint of painfully elongated toenails of her ulcer follow-up left foot.  Objective: Vital signs are stable alert and oriented x3.  Pulses are palpable.  There is no erythema edema cellulitis drainage or odor.  Charcot foot which had resulted in the skin breakdown of the medial arch due to hypertrophic bone growth has not changed.  However the ulceration has healed 100%.  His toenails are long thick yellow dystrophic-like mycotic.  There is no signs of infection at the site.  Assessment well-healing ulcerative lesion left.  Painful elongated toenails.  Plan: Discussed etiology pathology conservative surgical therapies.  At this point I debrided nails 1 through 5 bilaterally.  Rick dispensed his Messo brace with aperture today.  I will follow-up with him in a few weeks for reevaluation.

## 2020-03-11 ENCOUNTER — Ambulatory Visit: Payer: Managed Care, Other (non HMO) | Admitting: Podiatry

## 2020-04-17 ENCOUNTER — Ambulatory Visit: Payer: Managed Care, Other (non HMO) | Admitting: Podiatry

## 2020-05-29 ENCOUNTER — Ambulatory Visit: Payer: Managed Care, Other (non HMO) | Admitting: Podiatry

## 2020-07-01 ENCOUNTER — Ambulatory Visit: Payer: Managed Care, Other (non HMO) | Admitting: Podiatry

## 2020-07-01 ENCOUNTER — Encounter: Payer: Self-pay | Admitting: Podiatry

## 2020-07-01 ENCOUNTER — Ambulatory Visit (INDEPENDENT_AMBULATORY_CARE_PROVIDER_SITE_OTHER): Payer: Managed Care, Other (non HMO)

## 2020-07-01 ENCOUNTER — Other Ambulatory Visit: Payer: Self-pay

## 2020-07-01 DIAGNOSIS — M79676 Pain in unspecified toe(s): Secondary | ICD-10-CM | POA: Diagnosis not present

## 2020-07-01 DIAGNOSIS — M14672 Charcot's joint, left ankle and foot: Secondary | ICD-10-CM

## 2020-07-01 DIAGNOSIS — B351 Tinea unguium: Secondary | ICD-10-CM

## 2020-07-01 DIAGNOSIS — D689 Coagulation defect, unspecified: Secondary | ICD-10-CM | POA: Diagnosis not present

## 2020-07-01 DIAGNOSIS — M14671 Charcot's joint, right ankle and foot: Secondary | ICD-10-CM

## 2020-07-01 NOTE — Progress Notes (Signed)
He presents today for follow-up of his ulceration plantar aspect left foot.  He is also here for his some nail trim.  He is complaining about pain to the dorsal lateral aspect of the left foot.  Objective: Vital signs are stable he is alert and oriented x3 pulses are palpable.  Neurologic sensorium is diminished per Semmes Weinstein monofilament though he does have tenderness on palpation overlying the fourth fifth tarsometatarsal joint left foot.  Radiographs taken today do demonstrate Charcot deformity to the midfoot as well as the fourth fifth tarsometatarsal joint.  No fractures are identified.  Toenails are long thick yellow dystrophic-like mycotic and painful on palpation.  Chronic wound to the plantar medial aspect of the arch with the [place] device has gone on to heal uneventfully.  Assessment: Well-healed ulceration left foot.  Arthritis Charcot arthropathy left foot as well as pain in limb secondary to onychomycosis.  Plan: Debridement of toenails 1 through 5 bilaterally today follow-up with him in a couple months

## 2020-07-02 ENCOUNTER — Ambulatory Visit: Payer: Managed Care, Other (non HMO) | Admitting: Surgery

## 2020-07-02 ENCOUNTER — Encounter: Payer: Self-pay | Admitting: Surgery

## 2020-07-02 VITALS — BP 138/80 | HR 76 | Temp 98.9°F | Ht 72.0 in | Wt 200.8 lb

## 2020-07-02 DIAGNOSIS — K432 Incisional hernia without obstruction or gangrene: Secondary | ICD-10-CM

## 2020-07-02 DIAGNOSIS — M6208 Separation of muscle (nontraumatic), other site: Secondary | ICD-10-CM

## 2020-07-02 NOTE — Patient Instructions (Addendum)
We have scheduled you a CT for 07/12/2020 @ 2pm at the [facility] on [facility]. Nothing to eat or drink 4 hours prior to scan.Please pick up the contrast between now and the day before your CT. If you have any concerns or questions, please feel free to call our office.     Ventral Hernia  A ventral hernia is a bulge of tissue from inside the abdomen that pushes through a weak area of the muscles that form the front wall of the abdomen. The tissues inside the abdomen are inside a sac (peritoneum). These tissues include the small intestine, large intestine, and the fatty tissue that covers the intestines (omentum). Sometimes, the bulge that forms a hernia contains intestines. Other hernias contain only fat. Ventral hernias do not go away without surgical treatment. There are several types of ventral hernias. You may have:  A hernia at an incision site from previous abdominal surgery (incisional hernia).  A hernia just above the belly button (epigastric hernia), or at the belly button (umbilical hernia). These types of hernias can develop from heavy lifting or straining.  A hernia that comes and goes (reducible hernia). It may be visible only when you lift or strain. This type of hernia can be pushed back into the abdomen (reduced).  A hernia that traps abdominal tissue inside the hernia (incarcerated hernia). This type of hernia does not reduce.  A hernia that cuts off blood flow to the tissues inside the hernia (strangulated hernia). The tissues can start to die if this happens. This is a very painful bulge that cannot be reduced. A strangulated hernia is a medical emergency. What are the causes? This condition is caused by abdominal tissue putting pressure on an area of weakness in the abdominal muscles. What increases the risk? The following factors may make you more likely to develop this condition:  Being age 3 or older.  Being overweight or obese.  Having had previous  abdominal surgery, especially if there was an infection after surgery.  Having had an injury to the abdominal wall.  Frequently lifting or pushing heavy objects.  Having had several pregnancies.  Having a buildup of fluid inside the abdomen (ascites).  Straining to have a bowel movement or to urinate.  Having frequent coughing episodes. What are the signs or symptoms? The only symptom of a ventral hernia may be a painless bulge in the abdomen. A reducible hernia may be visible only when you strain, cough, or lift. Other symptoms may include:  Dull pain.  A feeling of pressure. Signs and symptoms of a strangulated hernia may include:  Increasing pain.  Nausea and vomiting.  Pain when pressing on the hernia.  The skin over the hernia turning red or purple.  Constipation.  Blood in the stool (feces). How is this diagnosed? This condition may be diagnosed based on:  Your symptoms.  Your medical history.  A physical exam. You may be asked to cough or strain while standing. These actions increase the pressure inside your abdomen and force the hernia through the opening in your muscles. Your health care provider may try to reduce the hernia by gently pushing the hernia back in.  Imaging studies, such as an ultrasound or CT scan. How is this treated? This condition is treated with surgery. If you have a strangulated hernia, surgery is done as soon as possible. If your hernia is small and not incarcerated, you may be asked to lose some weight before surgery. Follow these instructions  at home:  Follow instructions from your health care provider about eating or drinking restrictions.  If you are overweight, your health care provider may recommend that you increase your activity level and eat a healthier diet.  Do not lift anything that is heavier than 10 lb (4.5 kg), or the limit that you are told, until your health care provider says that it is safe.  Return to your normal  activities as told by your health care provider. Ask your health care provider what activities are safe for you. You may need to avoid activities that increase pressure on your hernia.  Take over-the-counter and prescription medicines only as told by your health care provider.  Keep all follow-up visits. This is important. Contact a health care provider if:  Your hernia gets larger.  Your hernia becomes painful. Get help right away if:  Your hernia becomes increasingly painful.  You have pain along with any of the following: ? Changes in skin color in the area of the hernia. ? Nausea. ? Vomiting. ? Fever. These symptoms may represent a serious problem that is an emergency. Do not wait to see if the symptoms will go away. Get medical help right away. Call your local emergency services (911 in the [place] Do not drive yourself to the hospital. Summary  A ventral hernia is a bulge of tissue from inside the abdomen that pushes through a weak area of the muscles that form the front wall of the abdomen.  This condition is treated with surgery, which may be urgent depending on your hernia.  Do not lift anything that is heavier than 10 lb (4.5 kg), and follow activity instructions from your health care provider. This information is not intended to replace advice given to you by your health care provider. Make sure you discuss any questions you have with your health care provider. Document Revised: 11/10/2019 Document Reviewed: 11/10/2019 Elsevier Patient Education  2021 [employer].

## 2020-07-02 NOTE — Progress Notes (Signed)
Patient ID: [name] [name], male   DOB: 12-22-1960, 60 y.o.   MRN: [id number]  Chief Complaint: Hernia recurrence  History of Present Illness [name] [name] is a 60 y.o. male with history of laparoscopic converted open with primary repair of umbilical defect following repair iatrogenic colonic injury.  He had a large diastases recti associated with his hernia prior to the repair attempt last May 2021. He does not complain of any pain now, still has a large diastases recti.  Does not know any particular types of alleviating or aggravating issues.  And has no particular concerns about the area of the previous hernia with exception of its softer character.  It seems he may be just be very proactive in terms of preventing any potential recurrence which he knows he is at risk for.  Past Medical History Past Medical History:  Diagnosis Date  . Anemia    pernicious anemia  . Chest pain    Unspecified  . Chronic gouty arthritis 12/26/2015  . Chronic kidney disease   . Dysrhythmia    pac's.  okay now.  Marland Kitchen GERD (gastroesophageal reflux disease)   . Hypertension   . Hypertriglyceridemia   . Kidney stone on left side    2018  . Palpitations   . Peripheral vascular disease ([place]) 2019  . Pre-diabetes   . Thyroid nodule 2019   being monitored      Past Surgical History:  Procedure Laterality Date  . CYSTOSCOPY W/ RETROGRADES Bilateral 03/23/2017   Procedure: CYSTOSCOPY WITH RETROGRADE PYELOGRAM;  Surgeon: Abbie Sons, MD;  Location: [facility];  Service: Urology;  Laterality: Bilateral;  . CYSTOSCOPY W/ URETERAL STENT PLACEMENT Left 03/31/2017   Procedure: CYSTOSCOPY WITH STENT REPLACEMENT;  Surgeon: Abbie Sons, MD;  Location: [facility];  Service: Urology;  Laterality: Left;  . CYSTOSCOPY/RETROGRADE/URETEROSCOPY/STONE EXTRACTION WITH BASKET Left 03/31/2017   Procedure: CYSTOSCOPY/RETROGRADE/URETEROSCOPY/STONE EXTRACTION WITH BASKET;  Surgeon: Abbie Sons, MD;  Location:  [facility];  Service: Urology;  Laterality: Left;  . CYSTOSCOPY/URETEROSCOPY/HOLMIUM LASER/STENT PLACEMENT Left 03/23/2017   Procedure: CYSTOSCOPY/URETEROSCOPY/HOLMIUM LASER/STENT PLACEMENT;  Surgeon: Abbie Sons, MD;  Location: [facility];  Service: Urology;  Laterality: Left;  . ESOPHAGOGASTRODUODENOSCOPY (EGD) WITH PROPOFOL N/A 10/22/2015   Procedure: ESOPHAGOGASTRODUODENOSCOPY (EGD) WITH PROPOFOL;  Surgeon: Lucilla Lame, MD;  Location: [facility];  Service: Endoscopy;  Laterality: N/A;  . EXTRACORPOREAL SHOCK WAVE LITHOTRIPSY  2016  . EXTRACORPOREAL SHOCK WAVE LITHOTRIPSY Left 09/13/2014   Procedure: EXTRACORPOREAL SHOCK WAVE LITHOTRIPSY (ESWL);  Surgeon: Hollice Espy, MD;  Location: [facility];  Service: Urology;  Laterality: Left;  . LOWER EXTREMITY ANGIOGRAPHY Left 07/15/2017   Procedure: LOWER EXTREMITY ANGIOGRAPHY;  Surgeon: Algernon Huxley, MD;  Location: [place] [facility];  Service: Cardiovascular;  Laterality: Left;  . lower extremity interventions x2 right leg Right    stent  . PERIPHERAL VASCULAR CATHETERIZATION Right 08/20/2014   Procedure: Lower Extremity Angiography;  Surgeon: Algernon Huxley, MD;  Location: [place] [facility];  Service: Cardiovascular;  Laterality: Right;  . PERIPHERAL VASCULAR CATHETERIZATION Right 08/20/2014   Procedure: Lower Extremity Intervention;  Surgeon: Algernon Huxley, MD;  Location: [place] [facility];  Service: Cardiovascular;  Laterality: Right;  . VENTRAL HERNIA REPAIR  08/11/2019   Procedure: HERNIA REPAIR VENTRAL ADULT WITH REPAIR OF LARGE INTESTINE LACERATION;  Surgeon: Herbert Pun, MD;  Location: [facility];  Service: General;;  . XI ROBOTIC ASSISTED VENTRAL HERNIA N/A 08/11/2019   Procedure: XI ROBOTIC ASSISTED VENTRAL HERNIA;  Surgeon: Windell Moment,  Reeves Forth, MD;  Location: [facility];  Service: General;  Laterality: N/A;  Converted to open procedure    No Known Allergies  Current Outpatient Medications  Medication Sig Dispense Refill  .  amLODipine (NORVASC) 10 MG tablet Take 10 mg by mouth daily.    Marland Kitchen aspirin EC 81 MG tablet Take 81 mg by mouth daily.    . clopidogrel (PLAVIX) 75 MG tablet Take 75 mg by mouth daily.    . metoprolol (TOPROL-XL) 200 MG 24 hr tablet Take 200 mg by mouth daily.     . Multiple Vitamin (MULTIVITAMIN WITH MINERALS) TABS tablet Take 1 tablet by mouth daily.    Marland Kitchen omega-3 acid ethyl esters (LOVAZA) 1 G capsule Take 4 g by mouth daily.     . pantoprazole (PROTONIX) 40 MG tablet Take 40 mg by mouth daily.    . rosuvastatin (CRESTOR) 40 MG tablet Take 40 mg by mouth daily.      No current facility-administered medications for this visit.    Family History Family History  Problem Relation Age of Onset  . Heart failure Other       Social History Social History   Tobacco Use  . Smoking status: Former Smoker    Packs/day: 1.00    Years: 37.00    Pack years: 37.00    Quit date: 04/21/2013    Years since quitting: 7.2  . Smokeless tobacco: Never Used  Vaping Use  . Vaping Use: Never used  Substance Use Topics  . Alcohol use: Yes    Alcohol/week: 14.0 standard drinks    Types: 7 Glasses of wine, 7 Cans of beer per week    Comment: trying to cut back on daily consumption  . Drug use: No        Review of Systems  Constitutional: Negative.   HENT: Negative.   Eyes: Negative.   Respiratory: Negative.   Cardiovascular: Negative.   Gastrointestinal: Negative.   Genitourinary: Negative.   Skin: Negative.   Neurological: Negative.   Psychiatric/Behavioral: Negative.       Physical Exam Blood pressure 138/80, pulse 76, temperature 98.9 F (37.2 C), temperature source Oral, height 6' (1.829 m), weight 200 lb 12.8 oz (91.1 kg), SpO2 96 %. Last Weight  Most recent update: 07/02/2020  1:56 PM   Weight  91.1 kg (200 lb 12.8 oz)            CONSTITUTIONAL: Well developed, and nourished, appropriately responsive and aware without distress.   EYES: Sclera non-icteric.   EARS, NOSE, MOUTH  AND THROAT: Mask worn.    Hearing is intact to voice.  NECK: Trachea is midline, and there is no jugular venous distension.  LYMPH NODES:  Lymph nodes in the neck are not enlarged. RESPIRATORY:  Lungs are clear, and breath sounds are equal bilaterally. Normal respiratory effort without pathologic use of accessory muscles. CARDIOVASCULAR: Heart is regular in rate and rhythm. GI: The abdomen is scarred with an umbilical to xiphoid scar, wide diastases recti readily apparent, without any appreciable fascial defects or the evidence of any irregular masses within the diastases.  Otherwise soft, nontender, and nondistended. There are no palpable masses, and no appreciable fascial defect. I did not appreciate hepatosplenomegaly. There were normal bowel sounds. MUSCULOSKELETAL:  Symmetrical muscle tone appreciated in all four extremities.  Wearing diabetic shoes/brace. SKIN: Skin turgor is normal. No pathologic skin lesions appreciated.  NEUROLOGIC:  Motor appears grossly normal.  Cranial nerves are grossly without defect. PSYCH:  Alert and  oriented to person, place and time. Affect is appropriate for situation.  Data Reviewed I have personally reviewed what is currently available of the patient's imaging, recent labs and medical records.   Labs:  CBC Latest Ref Rng & Units 08/11/2019 02/07/2017 03/18/2016  WBC 4.0 - 10.5 K/uL 14.0(H) 7.4 10.7(H)  Hemoglobin 13.0 - 17.0 g/dL 13.0 13.0 15.2  Hematocrit 39.0 - 52.0 % 38.1(L) 38.8(L) 44.6  Platelets 150 - 400 K/uL 120(L) 96(L) 133(L)   CMP Latest Ref Rng & Units 08/11/2019 07/13/2017 02/07/2017  Glucose 65 - 99 mg/dL - - 126(H)  BUN 6 - 20 mg/dL - 15 17  Creatinine 0.61 - 1.24 mg/dL 1.11 0.77 1.45(H)  Sodium 135 - 145 mmol/L - - 140  Potassium 3.5 - 5.1 mmol/L - - 3.7  Chloride 101 - 111 mmol/L - - 107  CO2 22 - 32 mmol/L - - 26  Calcium 8.9 - 10.3 mg/dL - - 8.0(L)  Total Protein 6.5 - 8.1 g/dL - - -  Total Bilirubin 0.3 - 1.2 mg/dL - - -  Alkaline Phos  38 - 126 U/L - - -  AST 15 - 41 U/L - - -  ALT 17 - 63 U/L - - -     Imaging:  Within last 24 hrs: DG Foot Complete Left  Result Date: 07/01/2020 Please see detailed radiograph report in office note.   Assessment    History of ventral hernia repair, primary repair without mesh reinforcement. Known/longstanding wide epigastric rectus diastases. Patient Active Problem List   Diagnosis Date Noted  . Ventral hernia 08/11/2019  . Coronary artery disease involving native coronary artery 06/20/2018  . Emphysema lung ([place]) 06/20/2018  . Lymphedema 09/07/2017  . Hyperglycemia 05/06/2017  . Uric acid nephrolithiasis 04/28/2017  . Swelling of limb 04/20/2017  . ARF (acute renal failure) ([place]) 02/06/2017  . Carotid stenosis 08/21/2016  . Atherosclerosis of native arteries of extremity with intermittent claudication ([place]) 08/21/2016  . Ataxia 04/30/2016  . Diplopia 04/30/2016  . Chronic gouty arthritis 12/26/2015  . Esophagitis, unspecified   . Portal hypertension ([place])   . Absolute anemia   . Other acute pancreatitis   . Acute blood loss anemia   . AKI (acute kidney injury) ([place]) 10/19/2015  . Health care maintenance 05/23/2015  . Pernicious anemia 05/23/2015  . Multinodular goiter 01/10/2015  . Hepatitis 08/29/2014  . Elevated PSA 08/29/2014  . GERD (gastroesophageal reflux disease) 08/29/2014  . Gross hematuria 08/29/2014  . History of tobacco use 01/13/2014  . Benign essential HTN 09/03/2013  . Chronic venous insufficiency 09/03/2013  . PVD (peripheral vascular disease) ([place]) 09/03/2013  . Palpitations 07/14/2011  . HYPERTRIGLYCERIDEMIA 09/13/2008  . CHEST PAIN-UNSPECIFIED 09/13/2008    Plan    Exam and clinical course are consistent that there is no recurrent hernia.  Believe he is concerned about the potential for recurrence and wanting to do all he can to prevent it.  We discussed abdominal binder.  We also discussed obtaining a CT scan to evaluate the fascia more  thoroughly for any early or small recurrence. He will be glad to follow-up in person after the imaging is completed.  Face-to-face time spent with the patient and accompanying care providers(if present) was 30 minutes, with more than 50% of the time spent counseling, educating, and coordinating care of the patient.      Ronny Bacon M.D., FACS 07/02/2020, 1:58 PM

## 2020-07-12 ENCOUNTER — Ambulatory Visit
Admission: RE | Admit: 2020-07-12 | Discharge: 2020-07-12 | Disposition: A | Discharge status: Home / Self Care | Payer: Managed Care, Other (non HMO) | Source: Ambulatory Visit | Attending: Surgery | Admitting: Surgery

## 2020-07-12 ENCOUNTER — Other Ambulatory Visit: Payer: Self-pay

## 2020-07-12 DIAGNOSIS — K432 Incisional hernia without obstruction or gangrene: Secondary | ICD-10-CM | POA: Diagnosis present

## 2020-07-12 HISTORY — DX: Disorder of kidney and ureter, unspecified: N28.9

## 2020-07-12 LAB — POCT I-STAT CREATININE: Creatinine, Ser: 1 mg/dL (ref 0.61–1.24)

## 2020-07-12 MED ORDER — IOHEXOL 300 MG/ML  SOLN
100.0000 mL | Freq: Once | INTRAMUSCULAR | Status: AC | PRN
  Administered 2020-07-12: 100 mL via INTRAVENOUS

## 2020-07-15 ENCOUNTER — Other Ambulatory Visit: Payer: Self-pay | Admitting: Surgery

## 2020-07-15 DIAGNOSIS — N2889 Other specified disorders of kidney and ureter: Secondary | ICD-10-CM

## 2020-07-16 ENCOUNTER — Encounter: Payer: Self-pay | Admitting: Surgery

## 2020-07-23 NOTE — Progress Notes (Signed)
07/24/2020  1:09 PM   [name] [name] 1960-07-03 [id number]  Referring provider: Ronny Bacon, MD [address] [place],   [id number] Chief Complaint  Patient presents with  . Uric acid nephrolithiasis     HPI: [name] [name] is a 60 y.o. male with a personal history of CKD, left uric acid nephrolithiasis, and renal insufficiency, who presents today for a consult regarding a renal mass suspicious for RCC.  He has a personal history of ventral hernia for which he underwent a CT.  This revealed a 1.6 x 1.4 cm exophytic mass arising from the lateral midportion of the right kidney measuring 1.6 x 1.4 cm.  Previous renal imaging in the form of a renal ultrasound in 2019, showed no cell lesions, only cysts in the right kidney. He has no previous enhanced cross-sectional imaging for comparison.  He does have a history of renal failure requiring dialysis from which he recovered.  He does have history of an attempted robotic ventral hernia repair converted to open ventral hernia repair with evidence of recurrence.  Most recently, he seen Dr. Christian Mate for evaluation of this.  He also has a history of nephrolithiasis for which he is undergone multiple procedures as outlined below.  He has been trying to alkalinize his urine with citrate which has been successful.  His most recent stone was actually a calcium stone which he analyzed himself.  On his most recent CT scan he does have an 8 mm left lower pole stone, nonobstructing.  Currently asymptomatic.    PMH: Past Medical History:  Diagnosis Date  . Anemia    pernicious anemia  . Chest pain    Unspecified  . Chronic gouty arthritis 12/26/2015  . Chronic kidney disease   . Dysrhythmia    pac's.  okay now.  Marland Kitchen GERD (gastroesophageal reflux disease)   . Hypertension   . Hypertriglyceridemia   . Kidney stone on left side    2018  . Palpitations   . Peripheral vascular disease ([place]) 2019  . Pre-diabetes    . Renal insufficiency   . Thyroid nodule 2019   being monitored    Surgical History: Past Surgical History:  Procedure Laterality Date  . CYSTOSCOPY W/ RETROGRADES Bilateral 03/23/2017   Procedure: CYSTOSCOPY WITH RETROGRADE PYELOGRAM;  Surgeon: Abbie Sons, MD;  Location: [facility];  Service: Urology;  Laterality: Bilateral;  . CYSTOSCOPY W/ URETERAL STENT PLACEMENT Left 03/31/2017   Procedure: CYSTOSCOPY WITH STENT REPLACEMENT;  Surgeon: Abbie Sons, MD;  Location: [facility];  Service: Urology;  Laterality: Left;  . CYSTOSCOPY/RETROGRADE/URETEROSCOPY/STONE EXTRACTION WITH BASKET Left 03/31/2017   Procedure: CYSTOSCOPY/RETROGRADE/URETEROSCOPY/STONE EXTRACTION WITH BASKET;  Surgeon: Abbie Sons, MD;  Location: [facility];  Service: Urology;  Laterality: Left;  . CYSTOSCOPY/URETEROSCOPY/HOLMIUM LASER/STENT PLACEMENT Left 03/23/2017   Procedure: CYSTOSCOPY/URETEROSCOPY/HOLMIUM LASER/STENT PLACEMENT;  Surgeon: Abbie Sons, MD;  Location: [facility];  Service: Urology;  Laterality: Left;  . ESOPHAGOGASTRODUODENOSCOPY (EGD) WITH PROPOFOL N/A 10/22/2015   Procedure: ESOPHAGOGASTRODUODENOSCOPY (EGD) WITH PROPOFOL;  Surgeon: Lucilla Lame, MD;  Location: [facility];  Service: Endoscopy;  Laterality: N/A;  . EXTRACORPOREAL SHOCK WAVE LITHOTRIPSY  2016  . EXTRACORPOREAL SHOCK WAVE LITHOTRIPSY Left 09/13/2014   Procedure: EXTRACORPOREAL SHOCK WAVE LITHOTRIPSY (ESWL);  Surgeon: Hollice Espy, MD;  Location: [facility];  Service: Urology;  Laterality: Left;  . LOWER EXTREMITY ANGIOGRAPHY Left 07/15/2017   Procedure: LOWER EXTREMITY ANGIOGRAPHY;  Surgeon: Algernon Huxley, MD;  Location: [place] [facility];  Service: Cardiovascular;  Laterality: Left;  . lower extremity interventions x2 right leg Right    stent  . PERIPHERAL VASCULAR CATHETERIZATION Right 08/20/2014   Procedure: Lower Extremity Angiography;  Surgeon: Algernon Huxley, MD;  Location: [place] [facility];  Service: Cardiovascular;   Laterality: Right;  . PERIPHERAL VASCULAR CATHETERIZATION Right 08/20/2014   Procedure: Lower Extremity Intervention;  Surgeon: Algernon Huxley, MD;  Location: [place] [facility];  Service: Cardiovascular;  Laterality: Right;  . VENTRAL HERNIA REPAIR  08/11/2019   Procedure: HERNIA REPAIR VENTRAL ADULT WITH REPAIR OF LARGE INTESTINE LACERATION;  Surgeon: Herbert Pun, MD;  Location: [facility];  Service: General;;  . XI ROBOTIC ASSISTED VENTRAL HERNIA N/A 08/11/2019   Procedure: XI ROBOTIC ASSISTED VENTRAL HERNIA;  Surgeon: Herbert Pun, MD;  Location: [facility];  Service: General;  Laterality: N/A;  Converted to open procedure    Home Medications:  Allergies as of 07/24/2020   No Known Allergies     Medication List       Accurate as of July 24, 2020  1:09 PM. If you have any questions, ask your nurse or doctor.        amLODipine 10 MG tablet Commonly known as: NORVASC Take 10 mg by mouth daily.   aspirin EC 81 MG tablet Take 81 mg by mouth daily.   clopidogrel 75 MG tablet Commonly known as: PLAVIX Take 75 mg by mouth daily.   metoprolol 200 MG 24 hr tablet Commonly known as: TOPROL-XL Take 200 mg by mouth daily.   multivitamin with minerals Tabs tablet Take 1 tablet by mouth daily.   omega-3 acid ethyl esters 1 g capsule Commonly known as: LOVAZA Take 4 g by mouth daily.   pantoprazole 40 MG tablet Commonly known as: PROTONIX Take 40 mg by mouth daily.   rosuvastatin 40 MG tablet Commonly known as: CRESTOR Take 40 mg by mouth daily.       Allergies: No Known Allergies  Family History: Family History  Problem Relation Age of Onset  . Heart failure Other     Social History:   reports that he quit smoking about 7 years ago. He has a 37.00 pack-year smoking history. He has never used smokeless tobacco. He reports current alcohol use of about 14.0 standard drinks of alcohol per week. He reports that he does not use drugs.  ROS: Pertinent ROS in  HPI.  Physical Exam: BP 121/83   Pulse 90   Ht 6' (1.829 m)   Wt 200 lb (90.7 kg)   BMI 27.12 kg/m   Constitutional:  Alert and oriented, No acute distress. HEENT: [place] AT, moist mucus membranes.  Trachea midline, no masses. Cardiovascular: No clubbing, cyanosis, or edema. Respiratory: Normal respiratory effort, no increased work of breathing. Skin: No rashes, bruises or suspicious lesions. Neurologic: Grossly intact, no focal deficits, moving all 4 extremities. Psychiatric: Normal mood and affect.  Laboratory Data:  Results for orders placed or performed during the hospital encounter of 07/12/20  I-STAT creatinine  Result Value Ref Range   Creatinine, Ser 1.00 0.61 - 1.24 mg/dL    Lab Results  Component Value Date   HGBA1C 5.8 10/19/2015    Pertinent Imaging:  CLINICAL DATA:  Incisional hernia, history of ventral hernia repair  EXAM: CT ABDOMEN AND PELVIS WITH CONTRAST  TECHNIQUE: Multidetector CT imaging of the abdomen and pelvis was performed using the standard protocol following bolus administration of intravenous contrast.  CONTRAST:  147mL OMNIPAQUE IOHEXOL 300 MG/ML SOLN, additional oral enteric  contrast  COMPARISON:  02/06/2017  FINDINGS: Lower chest: No acute abnormality.  Hepatobiliary: No solid liver abnormality is seen. No gallstones, gallbladder wall thickening, or biliary dilatation.  Pancreas: Unremarkable. No pancreatic ductal dilatation or surrounding inflammatory changes.  Spleen: Normal in size without significant abnormality.  Adrenals/Urinary Tract: Adrenal glands are unremarkable. Small nonobstructive calculi of the left kidney. No right-sided calculi. No hydronephrosis. There is a an exophytic mass arising from the lateral midportion of the right kidney measuring 1.6 x 1.4 cm (series 2, image 31). Although this is adjacent to a cortical cyst, attenuation is similar to renal cortex and too high to be accounted for by hemorrhagic or  proteinaceous contents. Bladder is unremarkable.  Stomach/Bowel: Stomach is within normal limits. Appendix appears normal. No evidence of bowel wall thickening, distention, or inflammatory changes. Sigmoid diverticulosis.  Vascular/Lymphatic: Aortic atherosclerosis. No enlarged abdominal or pelvic lymph nodes.  Reproductive: No mass or other significant abnormality.  Other: Status post low midline ventral hernia repair. At the inferior aspect of the hernia repair, there is a minimal recurrent herniation containing a partial, nonobstructed loop of mid small bowel, this component measuring no greater than 2.3 cm in width and 1.1 cm in depth (series 2, image 50). Small, fat containing right inguinal hernia. No abdominopelvic ascites.  Musculoskeletal: No acute or significant osseous findings.  IMPRESSION: 1. Status post low midline ventral hernia repair. At the inferior aspect of the hernia repair, there is a minimal recurrent herniation containing a partial, nonobstructed loop of mid small bowel, this component measuring no greater than 2.3 cm in width and 1.1 cm in depth. 2. Small, fat containing right inguinal hernia. 3. There is a 1.6 x 1.4 cm exophytic mass arising from the lateral midportion of the right kidney measuring 1.6 x 1.4 cm, which appears to contrast enhance on this single phase examination and is consistent with a small renal cell carcinoma. 4. Nonobstructive left nephrolithiasis.  These results will be called to the ordering clinician or representative by the Radiologist Assistant, and communication documented in the PACS or [employer].  Aortic Atherosclerosis (ICD10-I70.0).   Electronically Signed   By: Eddie Candle M.D.   On: 07/14/2020 17:53   I have personally reviewed the images and agree with radiologist interpretation. He also has a lower pole, and measures 8 mm. Non-obstructing.   Assessment & Plan:    1. Right renal masses Incidental l 1.6  cm exophytic right renal mass concerning for RCC.  A solid renal mass raises the suspicion of primary renal malignancy.  We discussed this in detail and in regards to the spectrum of renal masses which includes cysts (pure cysts are considered benign), solid masses and everything in between. The risk of metastasis increases as the size of solid renal mass increases. In general, it is believed that the risk of metastasis for renal masses less than 3-4 cm is small (up to approximately 5%) based mainly on large retrospective studies.  In some cases and especially in patients of older age and multiple comorbidities a surveillance approach may be appropriate. The treatment of solid renal masses includes: surveillance, cryoablation (percutaneous and laparoscopic) in addition to partial and complete nephrectomy (each with option of laparoscopic, robotic and open depending on appropriateness). Furthermore, nephrectomy appears to be an independent risk factor for the development of chronic kidney disease suggesting that nephron sparing approaches should be implored whenever feasible. We reviewed these options in context of the patients current situation as well as the pros and  cons of each.  Cryo vs robotic partial nephrectomy. Prefer cryo due to ventral hernias, history of renal failure, and other comorbidities.  Additionally, location mass is very favorable for this procedure.    He agreed with the plan and decided to do the cryo.   Plan for interventional radiology consult   2. Uric acid nephrolithiasis 8 mm left lower pole nonobstructing stone  Currently asymptomatic, may consider lithotripsy down the road after his renal mass and ventral hernia have been addressed   - Abdomen 1 view ([facility]); Future    Follow Up:  Follow up with Dr. Bernardo Heater in 6 months with [facility] for left kidney stone.  Jaclyn Shaggy, am acting as a scribe for Dr. Hollice Espy.   I have reviewed the above documentation for  accuracy and completeness, and I agree with the above.   Hollice Espy, MD     [facility] [address] [place], [place] [address] [phone]

## 2020-07-24 ENCOUNTER — Other Ambulatory Visit: Payer: Self-pay

## 2020-07-24 ENCOUNTER — Encounter: Payer: Self-pay | Admitting: Urology

## 2020-07-24 ENCOUNTER — Ambulatory Visit: Payer: Managed Care, Other (non HMO) | Admitting: Urology

## 2020-07-24 VITALS — BP 121/83 | HR 90 | Ht 72.0 in | Wt 200.0 lb

## 2020-07-24 DIAGNOSIS — N200 Calculus of kidney: Secondary | ICD-10-CM | POA: Diagnosis not present

## 2020-07-24 DIAGNOSIS — N2889 Other specified disorders of kidney and ureter: Secondary | ICD-10-CM | POA: Diagnosis not present

## 2020-07-30 ENCOUNTER — Encounter: Payer: Self-pay | Admitting: Urology

## 2020-07-30 ENCOUNTER — Telehealth: Payer: Self-pay

## 2020-07-30 DIAGNOSIS — N2889 Other specified disorders of kidney and ureter: Secondary | ICD-10-CM

## 2020-07-30 NOTE — Telephone Encounter (Signed)
Order placed for Renal Cryo w/[facility]. Message sent to [facility], she will contact patient and schedule this week possibly.

## 2020-08-01 ENCOUNTER — Other Ambulatory Visit: Payer: Self-pay

## 2020-08-01 ENCOUNTER — Encounter: Payer: Self-pay | Admitting: *Deleted

## 2020-08-01 ENCOUNTER — Ambulatory Visit
Admission: RE | Admit: 2020-08-01 | Discharge: 2020-08-01 | Disposition: A | Discharge status: Home / Self Care | Payer: Managed Care, Other (non HMO) | Source: Ambulatory Visit | Attending: Urology | Admitting: Urology

## 2020-08-01 DIAGNOSIS — N2889 Other specified disorders of kidney and ureter: Secondary | ICD-10-CM

## 2020-08-01 HISTORY — PX: IR RADIOLOGIST EVAL & MGMT: IMG5224

## 2020-08-01 NOTE — Consult Note (Signed)
Chief Complaint:  Right renal mass  Referring Physician(s): Brandon,Ashley  History of Present Illness: [name] [name] is a 60 y.o. male with a known history of mild chronic kidney disease, nephrolithiasis, hypertension, and peripheral vascular disease.  He was found to have a 1.6 cm lateral enhancing right midpole exophytic solid renal mass by CT.  CT was performed for abdominal pain and known incisional hernia.  He has a comparison CT from 2016 which did not demonstrate the lesion.  By CT characteristics the lesion is concerning for a small renal neoplasm such as a carcinoma or oncocytoma.  No other symptoms including hematuria, flank or abdominal pain.  No other urinary tract symptoms at this time.  Past Medical History:  Diagnosis Date  . Anemia    pernicious anemia  . Chest pain    Unspecified  . Chronic gouty arthritis 12/26/2015  . Chronic kidney disease   . Dysrhythmia    pac's.  okay now.  Marland Kitchen GERD (gastroesophageal reflux disease)   . Hypertension   . Hypertriglyceridemia   . Kidney stone on left side    2018  . Palpitations   . Peripheral vascular disease ([place]) 2019  . Pre-diabetes   . Renal insufficiency   . Thyroid nodule 2019   being monitored    Past Surgical History:  Procedure Laterality Date  . CYSTOSCOPY W/ RETROGRADES Bilateral 03/23/2017   Procedure: CYSTOSCOPY WITH RETROGRADE PYELOGRAM;  Surgeon: Abbie Sons, MD;  Location: [facility];  Service: Urology;  Laterality: Bilateral;  . CYSTOSCOPY W/ URETERAL STENT PLACEMENT Left 03/31/2017   Procedure: CYSTOSCOPY WITH STENT REPLACEMENT;  Surgeon: Abbie Sons, MD;  Location: [facility];  Service: Urology;  Laterality: Left;  . CYSTOSCOPY/RETROGRADE/URETEROSCOPY/STONE EXTRACTION WITH BASKET Left 03/31/2017   Procedure: CYSTOSCOPY/RETROGRADE/URETEROSCOPY/STONE EXTRACTION WITH BASKET;  Surgeon: Abbie Sons, MD;  Location: [facility];  Service: Urology;  Laterality: Left;  .  CYSTOSCOPY/URETEROSCOPY/HOLMIUM LASER/STENT PLACEMENT Left 03/23/2017   Procedure: CYSTOSCOPY/URETEROSCOPY/HOLMIUM LASER/STENT PLACEMENT;  Surgeon: Abbie Sons, MD;  Location: [facility];  Service: Urology;  Laterality: Left;  . ESOPHAGOGASTRODUODENOSCOPY (EGD) WITH PROPOFOL N/A 10/22/2015   Procedure: ESOPHAGOGASTRODUODENOSCOPY (EGD) WITH PROPOFOL;  Surgeon: Lucilla Lame, MD;  Location: [facility];  Service: Endoscopy;  Laterality: N/A;  . EXTRACORPOREAL SHOCK WAVE LITHOTRIPSY  2016  . EXTRACORPOREAL SHOCK WAVE LITHOTRIPSY Left 09/13/2014   Procedure: EXTRACORPOREAL SHOCK WAVE LITHOTRIPSY (ESWL);  Surgeon: Hollice Espy, MD;  Location: [facility];  Service: Urology;  Laterality: Left;  . LOWER EXTREMITY ANGIOGRAPHY Left 07/15/2017   Procedure: LOWER EXTREMITY ANGIOGRAPHY;  Surgeon: Algernon Huxley, MD;  Location: [place] [facility];  Service: Cardiovascular;  Laterality: Left;  . lower extremity interventions x2 right leg Right    stent  . PERIPHERAL VASCULAR CATHETERIZATION Right 08/20/2014   Procedure: Lower Extremity Angiography;  Surgeon: Algernon Huxley, MD;  Location: [place] [facility];  Service: Cardiovascular;  Laterality: Right;  . PERIPHERAL VASCULAR CATHETERIZATION Right 08/20/2014   Procedure: Lower Extremity Intervention;  Surgeon: Algernon Huxley, MD;  Location: [place] [facility];  Service: Cardiovascular;  Laterality: Right;  . VENTRAL HERNIA REPAIR  08/11/2019   Procedure: HERNIA REPAIR VENTRAL ADULT WITH REPAIR OF LARGE INTESTINE LACERATION;  Surgeon: Herbert Pun, MD;  Location: [facility];  Service: General;;  . XI ROBOTIC ASSISTED VENTRAL HERNIA N/A 08/11/2019   Procedure: XI ROBOTIC ASSISTED VENTRAL HERNIA;  Surgeon: Herbert Pun, MD;  Location: [facility];  Service: General;  Laterality: N/A;  Converted to open procedure  Allergies: Patient has no known allergies.  Medications: Prior to Admission medications   Medication Sig Start Date End Date Taking?  Authorizing Provider  amLODipine (NORVASC) 10 MG tablet Take 10 mg by mouth daily. 05/06/17   [provider]  aspirin EC 81 MG tablet Take 81 mg by mouth daily.    [provider]  clopidogrel (PLAVIX) 75 MG tablet Take 75 mg by mouth daily.    [provider]  metoprolol (TOPROL-XL) 200 MG 24 hr tablet Take 200 mg by mouth daily.     [provider]  Multiple Vitamin (MULTIVITAMIN WITH MINERALS) TABS tablet Take 1 tablet by mouth daily.    [provider]  omega-3 acid ethyl esters (LOVAZA) 1 G capsule Take 4 g by mouth daily.     [provider]  pantoprazole (PROTONIX) 40 MG tablet Take 40 mg by mouth daily.    [provider]  rosuvastatin (CRESTOR) 40 MG tablet Take 40 mg by mouth daily.     [provider]     Family History  Problem Relation Age of Onset  . Heart failure Other     Social History   Socioeconomic History  . Marital status: Married    Spouse name: Not on file  . Number of children: Not on file  . Years of education: Not on file  . Highest education level: Not on file  Occupational History  . Occupation: Full Time  Tobacco Use  . Smoking status: Former Smoker    Packs/day: 1.00    Years: 37.00    Pack years: 37.00    Quit date: 04/21/2013    Years since quitting: 7.2  . Smokeless tobacco: Never Used  Vaping Use  . Vaping Use: Never used  Substance and Sexual Activity  . Alcohol use: Yes    Alcohol/week: 14.0 standard drinks    Types: 7 Glasses of wine, 7 Cans of beer per week    Comment: trying to cut back on daily consumption  . Drug use: No  . Sexual activity: Yes  Other Topics Concern  . Not on file  Social History Narrative   Regular exercise: No   Social Determinants of Health   Financial Resource Strain: Not on file  Food Insecurity: Not on file  Transportation Needs: Not on file  Physical Activity: Not on file  Stress: Not on file  Social Connections: Not on file      Review of Systems  Review of Systems: A 12 point ROS discussed and pertinent positives are indicated in the HPI above.  All other systems are negative.  Physical Exam No direct physical exam was performed, telephone health visit only today because of COVID pandemic Vital Signs: There were no vitals taken for this visit.  Imaging: CT Abdomen Pelvis W Contrast  Result Date: 07/14/2020 CLINICAL DATA:  Incisional hernia, history of ventral hernia repair EXAM: CT ABDOMEN AND PELVIS WITH CONTRAST TECHNIQUE: Multidetector CT imaging of the abdomen and pelvis was performed using the standard protocol following bolus administration of intravenous contrast. CONTRAST:  172mL OMNIPAQUE IOHEXOL 300 MG/ML SOLN, additional oral enteric contrast COMPARISON:  02/06/2017 FINDINGS: Lower chest: No acute abnormality. Hepatobiliary: No solid liver abnormality is seen. No gallstones, gallbladder wall thickening, or biliary dilatation. Pancreas: Unremarkable. No pancreatic ductal dilatation or surrounding inflammatory changes. Spleen: Normal in size without significant abnormality. Adrenals/Urinary Tract: Adrenal glands are unremarkable. Small nonobstructive calculi of the left kidney. No right-sided calculi. No hydronephrosis. There is a an  exophytic mass arising from the lateral midportion of the right kidney measuring 1.6 x 1.4 cm (series 2, image 31). Although this is adjacent to a cortical cyst, attenuation is similar to renal cortex and too high to be accounted for by hemorrhagic or proteinaceous contents. Bladder is unremarkable. Stomach/Bowel: Stomach is within normal limits. Appendix appears normal. No evidence of bowel wall thickening, distention, or inflammatory changes. Sigmoid diverticulosis. Vascular/Lymphatic: Aortic atherosclerosis. No enlarged abdominal or pelvic lymph nodes. Reproductive: No mass or other significant abnormality. Other: Status post low midline ventral hernia repair. At the inferior  aspect of the hernia repair, there is a minimal recurrent herniation containing a partial, nonobstructed loop of mid small bowel, this component measuring no greater than 2.3 cm in width and 1.1 cm in depth (series 2, image 50). Small, fat containing right inguinal hernia. No abdominopelvic ascites. Musculoskeletal: No acute or significant osseous findings. IMPRESSION: 1. Status post low midline ventral hernia repair. At the inferior aspect of the hernia repair, there is a minimal recurrent herniation containing a partial, nonobstructed loop of mid small bowel, this component measuring no greater than 2.3 cm in width and 1.1 cm in depth. 2. Small, fat containing right inguinal hernia. 3. There is a 1.6 x 1.4 cm exophytic mass arising from the lateral midportion of the right kidney measuring 1.6 x 1.4 cm, which appears to contrast enhance on this single phase examination and is consistent with a small renal cell carcinoma. 4. Nonobstructive left nephrolithiasis. These results will be called to the ordering clinician or representative by the Radiologist Assistant, and communication documented in the PACS or [employer]. Aortic Atherosclerosis (ICD10-I70.0). Electronically Signed   By: Eddie Candle M.D.   On: 07/14/2020 17:53    Labs:  CBC: Recent Labs    08/11/19 1645  WBC 14.0*  HGB 13.0  HCT 38.1*  PLT 120*    COAGS: No results for input(s): INR, APTT in the last 8760 hours.  BMP: Recent Labs    08/11/19 1645 07/12/20 1420  CREATININE 1.11 1.00  GFRNONAA >60  --   GFRAA >60  --     LIVER FUNCTION TESTS: No results for input(s): BILITOT, AST, ALT, ALKPHOS, PROT, ALBUMIN in the last 8760 hours.   Assessment and Plan:  Asymptomatic 1.6 cm exophytic solid enhancing right renal mass concerning for renal cell carcinoma by imaging.  Lesion is new compared to 2016 exams.  CT-guided biopsy and cryoablation reviewed in detail with the patient.  The procedure, risk, benefits and  alternatives were reviewed.  He understands the procedure requires general anesthesia and possibly 1 night overnight recovery.  All questions were addressed.  After discussion he would like to proceed with cryoablation.  Plan: Schedule for elective CT-guided cryoablation and biopsy at [facility].  Thank you for this interesting consult.  I greatly enjoyed meeting [name] [name] and look forward to participating in their care.  A copy of this report was sent to the requesting provider on this date.  Electronically Signed: Greggory Keen 08/01/2020, 1:51 PM   I spent a total of 40 Minutes   in remote  clinical consultation, greater than 50% of which was counseling/coordinating care for this patient with a 1.6 cm renal mass suspicious for renal cell carcinoma.    Visit type: Audio only (telephone). Audio (no video) only due to patient's lack of internet/smartphone capability. Alternative for in-person consultation at [facility], [address], [place], [place]. This visit type was conducted due to  national recommendations for restrictions regarding the COVID-19 Pandemic (e.g. social distancing).  This format is felt to be most appropriate for this patient at this time.  All issues noted in this document were discussed and addressed.

## 2020-08-09 ENCOUNTER — Other Ambulatory Visit (HOSPITAL_COMMUNITY): Payer: Self-pay | Admitting: Interventional Radiology

## 2020-08-09 DIAGNOSIS — N2889 Other specified disorders of kidney and ureter: Secondary | ICD-10-CM

## 2020-08-13 NOTE — Patient Instructions (Addendum)
DUE TO COVID-19 ONLY ONE VISITOR IS ALLOWED TO COME WITH YOU AND STAY IN THE WAITING ROOM ONLY DURING PRE OP AND PROCEDURE.   **NO VISITORS ARE ALLOWED IN THE SHORT STAY AREA OR RECOVERY ROOM!!**  IF YOU WILL BE ADMITTED INTO THE HOSPITAL YOU ARE ALLOWED ONLY TWO SUPPORT PEOPLE DURING VISITATION HOURS ONLY (10AM -8PM)   . The support person(s) may change daily. . The support person(s) must pass our screening, gel in and out, and wear a mask at all times, including in the patient's room. . Patients must also wear a mask when staff or their support person are in the room.  No visitors under the age of 33. Any visitor under the age of 16 must be accompanied by an adult.    COVID SWAB TESTING MUST BE COMPLETED ON:  Monday, 08-26-20 @ 11:45 AM  [place], [place] in the [employer], [place] procedure is scheduled on:  Wednesday, 08-28-20   Report to [facility] Main  Entrance    Report to admitting at 7:00 AM   Call this number if you have problems the morning of surgery [phone]   Do not eat food or drink liquids :After Midnight.     Oral Hygiene is also important to reduce your risk of infection.                                    Remember - BRUSH YOUR TEETH THE MORNING OF SURGERY WITH YOUR REGULAR TOOTHPASTE   Do NOT smoke after Midnight   Take these medicines the morning of surgery with A SIP OF WATER: Amlodipine, Metoprolol, Pantoprazole, Rosuvastatin                                You may not have any metal on your body including jewelry, and body piercings             Do not wear lotions, powders, cologne, or deodorant             Men may shave face and neck.   Do not bring valuables to the hospital. [place].   Contacts, dentures or bridgework may not be worn into surgery.   Bring small overnight bag day of surgery.                 Please read over the following  fact sheets you were given: IF YOU HAVE QUESTIONS ABOUT YOUR PRE OP INSTRUCTIONS PLEASE CALL  [place] - Preparing for Surgery Before surgery, you can play an important role.  Because skin is not sterile, your skin needs to be as free of germs as possible.  You can reduce the number of germs on your skin by washing with CHG (chlorahexidine gluconate) soap before surgery.  CHG is an antiseptic cleaner which kills germs and bonds with the skin to continue killing germs even after washing. Please DO NOT use if you have an allergy to CHG or antibacterial soaps.  If your skin becomes reddened/irritated stop using the CHG and inform your nurse when you arrive at Short Stay. Do not shave (including legs and underarms) for at least 48 hours prior to the first CHG shower.  You may shave your face/neck.  Please follow these instructions carefully:  1.  Shower with CHG Soap the night before surgery and the  morning of surgery.  2.  If you choose to wash your hair, wash your hair first as usual with your normal  shampoo.  3.  After you shampoo, rinse your hair and body thoroughly to remove the shampoo.                             4.  Use CHG as you would any other liquid soap.  You can apply chg directly to the skin and wash.  Gently with a scrungie or clean washcloth.  5.  Apply the CHG Soap to your body ONLY FROM THE NECK DOWN.   Do   not use on face/ open                           Wound or open sores. Avoid contact with eyes, ears mouth and   genitals (private parts).                       Wash face,  Genitals (private parts) with your normal soap.             6.  Wash thoroughly, paying special attention to the area where your    surgery  will be performed.  7.  Thoroughly rinse your body with warm water from the neck down.  8.  DO NOT shower/wash with your normal soap after using and rinsing off the CHG Soap.                9.  Pat yourself dry with a clean towel.            10.   Wear clean pajamas.            11.  Place clean sheets on your bed the night of your first shower and do not  sleep with pets. Day of Surgery : Do not apply any lotions/deodorants the morning of surgery.  Please wear clean clothes to the hospital/surgery center.  FAILURE TO FOLLOW THESE INSTRUCTIONS MAY RESULT IN THE CANCELLATION OF YOUR SURGERY  PATIENT SIGNATURE_________________________________  NURSE SIGNATURE__________________________________  ________________________________________________________________________

## 2020-08-13 NOTE — Progress Notes (Signed)
Please enter orders for procedure scheduled for 08-28-20.

## 2020-08-13 NOTE — Progress Notes (Addendum)
COVID Vaccine Completed: x4 Date COVID Vaccine completed: Has received booster: 1-22 4th booster COVID vaccine manufacturer: [employer]     Date of COVID positive in last 90 days:  N/A  PCP - Frazier Richards, MD Cardiologist - Kathlyn [place], MD  Chest x-ray - CT chest 09-22-19 Epic EKG - 08-20-20 Epic Stress Test - 2013 per cardiology note ECHO - 2013 Epic Cardiac Cath -  Pacemaker/ICD device last checked: Spinal Cord Stimulator: Holter monitor - 2013 per cardiology note  Sleep Study - N/A CPAP -   Fasting Blood Sugar - N/A Checks Blood Sugar _____ times a day  Blood Thinner Instructions:  Plavix 75 mg.  Last dose to be on 08-22-20 Aspirin Instructions:  ASA 81 mg  Last Dose: To be 08-22-20  Activity level:  Can go up a flight of stairs and perform activities of daily living without stopping and without symptoms of chest pain or shortness of breath.   Anesthesia review: CAD, hx of palpitations, chest pain.  PVD, HTN, emphysema, CKD  Patient denies shortness of breath, fever, cough and chest pain at PAT appointment   Patient verbalized understanding of instructions that were given to them at the PAT appointment. Patient was also instructed that they will need to review over the PAT instructions again at home before surgery.

## 2020-08-20 ENCOUNTER — Encounter (HOSPITAL_COMMUNITY): Payer: Self-pay

## 2020-08-20 ENCOUNTER — Other Ambulatory Visit: Payer: Self-pay

## 2020-08-20 ENCOUNTER — Encounter (HOSPITAL_COMMUNITY)
Admission: RE | Admit: 2020-08-20 | Discharge: 2020-08-20 | Disposition: A | Discharge status: Home / Self Care | Payer: Managed Care, Other (non HMO) | Source: Ambulatory Visit | Attending: Interventional Radiology | Admitting: Interventional Radiology

## 2020-08-20 DIAGNOSIS — Z01818 Encounter for other preprocedural examination: Secondary | ICD-10-CM | POA: Insufficient documentation

## 2020-08-20 HISTORY — DX: Other specified disorders of kidney and ureter: N28.89

## 2020-08-20 HISTORY — DX: Personal history of urinary calculi: Z87.442

## 2020-08-20 LAB — CBC
HCT: 43.1 % (ref 39.0–52.0)
Hemoglobin: 14.4 g/dL (ref 13.0–17.0)
MCH: 33.9 pg (ref 26.0–34.0)
MCHC: 33.4 g/dL (ref 30.0–36.0)
MCV: 101.4 fL — ABNORMAL HIGH (ref 80.0–100.0)
Platelets: 117 10*3/uL — ABNORMAL LOW (ref 150–400)
RBC: 4.25 MIL/uL (ref 4.22–5.81)
RDW: 13 % (ref 11.5–15.5)
WBC: 6.8 10*3/uL (ref 4.0–10.5)
nRBC: 0 % (ref 0.0–0.2)

## 2020-08-20 LAB — BASIC METABOLIC PANEL
Anion gap: 10 (ref 5–15)
BUN: 15 mg/dL (ref 8–23)
CO2: 23 mmol/L (ref 22–32)
Calcium: 8.8 mg/dL — ABNORMAL LOW (ref 8.9–10.3)
Chloride: 109 mmol/L (ref 98–111)
Creatinine, Ser: 1.01 mg/dL (ref 0.61–1.24)
GFR, Estimated: >60 mL/min (ref >60)
Glucose, Bld: 143 mg/dL — ABNORMAL HIGH (ref 70–99)
Potassium: 4.3 mmol/L (ref 3.5–5.1)
Sodium: 142 mmol/L (ref 135–145)

## 2020-08-20 LAB — HEMOGLOBIN A1C
Hgb A1c MFr Bld: 6.5 % — ABNORMAL HIGH (ref 4.8–5.6)
Mean Plasma Glucose: 139.85 mg/dL

## 2020-08-26 ENCOUNTER — Other Ambulatory Visit
Admission: RE | Admit: 2020-08-26 | Discharge: 2020-08-26 | Disposition: A | Discharge status: Home / Self Care | Payer: Managed Care, Other (non HMO) | Source: Ambulatory Visit | Attending: Interventional Radiology | Admitting: Interventional Radiology

## 2020-08-26 ENCOUNTER — Other Ambulatory Visit: Payer: Self-pay | Admitting: Radiology

## 2020-08-26 ENCOUNTER — Other Ambulatory Visit: Payer: Self-pay

## 2020-08-26 DIAGNOSIS — Z20822 Contact with and (suspected) exposure to covid-19: Secondary | ICD-10-CM | POA: Diagnosis not present

## 2020-08-26 DIAGNOSIS — Z01812 Encounter for preprocedural laboratory examination: Secondary | ICD-10-CM | POA: Diagnosis present

## 2020-08-26 LAB — SARS CORONAVIRUS 2 (TAT 6-24 HRS): SARS Coronavirus 2: NEGATIVE

## 2020-08-27 ENCOUNTER — Other Ambulatory Visit: Payer: Self-pay | Admitting: Radiology

## 2020-08-27 ENCOUNTER — Other Ambulatory Visit: Payer: Self-pay | Admitting: Student

## 2020-08-27 ENCOUNTER — Encounter (HOSPITAL_COMMUNITY): Payer: Self-pay | Admitting: Interventional Radiology

## 2020-08-27 NOTE — Anesthesia Preprocedure Evaluation (Addendum)
Anesthesia Evaluation  Patient identified by MRN, date of birth, ID band Patient awake    Reviewed: Allergy & Precautions, NPO status , Patient's Chart, lab work & pertinent test results, reviewed documented beta blocker date and time   History of Anesthesia Complications Negative for: history of anesthetic complications  Airway Mallampati: II  TM Distance: >3 FB Neck ROM: Full    Dental  (+) Caps, Dental Advisory Given   Pulmonary COPD, former smoker,  08/26/2020 SARS coronavirus NEG   breath sounds clear to auscultation       Cardiovascular hypertension, Pt. on medications and Pt. on home beta blockers (-) angina+ Peripheral Vascular Disease   Rhythm:Regular Rate:Normal  Remote h/o cardiology eval: normal ECHO, no ischemia on stress, Holter unremarkable   Neuro/Psych negative neurological ROS     GI/Hepatic Neg liver ROS, GERD  Medicated and Controlled,  Endo/Other  negative endocrine ROS  Renal/GU Renal InsufficiencyRenal diseaseRenal mass stones     Musculoskeletal  (+) Arthritis ,   Abdominal   Peds  Hematology negative hematology ROS (+)   Anesthesia Other Findings   Reproductive/Obstetrics                           Anesthesia Physical Anesthesia Plan  ASA: III  Anesthesia Plan: General   Post-op Pain Management:    Induction: Intravenous  PONV Risk Score and Plan: 2 and Ondansetron and Dexamethasone  Airway Management Planned: Oral ETT  Additional Equipment: None  Intra-op Plan:   Post-operative Plan: Extubation in OR  Informed Consent: I have reviewed the patients History and Physical, chart, labs and discussed the procedure including the risks, benefits and alternatives for the proposed anesthesia with the patient or authorized representative who has indicated his/her understanding and acceptance.     Dental advisory given  Plan Discussed with: CRNA and  Surgeon  Anesthesia Plan Comments:        Anesthesia Quick Evaluation

## 2020-08-28 ENCOUNTER — Encounter (HOSPITAL_COMMUNITY): Payer: Self-pay | Admitting: Interventional Radiology

## 2020-08-28 ENCOUNTER — Observation Stay (HOSPITAL_COMMUNITY)
Admission: RE | Admit: 2020-08-28 | Discharge: 2020-08-28 | Disposition: A | Discharge status: Home / Self Care | Payer: Managed Care, Other (non HMO) | Source: Ambulatory Visit | Attending: Interventional Radiology | Admitting: Interventional Radiology

## 2020-08-28 ENCOUNTER — Ambulatory Visit (HOSPITAL_COMMUNITY): Payer: Managed Care, Other (non HMO)

## 2020-08-28 ENCOUNTER — Ambulatory Visit (HOSPITAL_COMMUNITY): Payer: Managed Care, Other (non HMO) | Admitting: Physician Assistant

## 2020-08-28 ENCOUNTER — Other Ambulatory Visit: Payer: Self-pay

## 2020-08-28 ENCOUNTER — Encounter (HOSPITAL_COMMUNITY): Payer: Self-pay

## 2020-08-28 ENCOUNTER — Observation Stay (HOSPITAL_COMMUNITY)
Admission: RE | Admit: 2020-08-28 | Discharge: 2020-08-28 | Disposition: A | Discharge status: Home / Self Care | Payer: Managed Care, Other (non HMO) | Attending: Interventional Radiology | Admitting: Interventional Radiology

## 2020-08-28 ENCOUNTER — Encounter (HOSPITAL_COMMUNITY)
Admission: RE | Disposition: A | Discharge status: Home / Self Care | Payer: Self-pay | Source: Home / Self Care | Attending: Interventional Radiology

## 2020-08-28 ENCOUNTER — Ambulatory Visit (HOSPITAL_COMMUNITY): Payer: Managed Care, Other (non HMO) | Admitting: Anesthesiology

## 2020-08-28 DIAGNOSIS — I129 Hypertensive chronic kidney disease with stage 1 through stage 4 chronic kidney disease, or unspecified chronic kidney disease: Secondary | ICD-10-CM | POA: Diagnosis not present

## 2020-08-28 DIAGNOSIS — Z7982 Long term (current) use of aspirin: Secondary | ICD-10-CM | POA: Diagnosis not present

## 2020-08-28 DIAGNOSIS — Z7902 Long term (current) use of antithrombotics/antiplatelets: Secondary | ICD-10-CM | POA: Diagnosis not present

## 2020-08-28 DIAGNOSIS — E781 Pure hyperglyceridemia: Secondary | ICD-10-CM | POA: Insufficient documentation

## 2020-08-28 DIAGNOSIS — I739 Peripheral vascular disease, unspecified: Secondary | ICD-10-CM | POA: Insufficient documentation

## 2020-08-28 DIAGNOSIS — C641 Malignant neoplasm of right kidney, except renal pelvis: Secondary | ICD-10-CM | POA: Diagnosis not present

## 2020-08-28 DIAGNOSIS — N2889 Other specified disorders of kidney and ureter: Secondary | ICD-10-CM | POA: Diagnosis present

## 2020-08-28 DIAGNOSIS — N189 Chronic kidney disease, unspecified: Secondary | ICD-10-CM | POA: Diagnosis not present

## 2020-08-28 DIAGNOSIS — Z79899 Other long term (current) drug therapy: Secondary | ICD-10-CM | POA: Diagnosis not present

## 2020-08-28 DIAGNOSIS — R7303 Prediabetes: Secondary | ICD-10-CM | POA: Insufficient documentation

## 2020-08-28 DIAGNOSIS — Z87442 Personal history of urinary calculi: Secondary | ICD-10-CM | POA: Diagnosis not present

## 2020-08-28 DIAGNOSIS — Z01818 Encounter for other preprocedural examination: Secondary | ICD-10-CM

## 2020-08-28 HISTORY — PX: RADIOLOGY WITH ANESTHESIA: SHX6223

## 2020-08-28 LAB — CBC WITH DIFFERENTIAL/PLATELET
Abs Immature Granulocytes: 0.02 10*3/uL (ref 0.00–0.07)
Basophils Absolute: 0 10*3/uL (ref 0.0–0.1)
Basophils Relative: 0 %
Eosinophils Absolute: 0.1 10*3/uL (ref 0.0–0.5)
Eosinophils Relative: 1 %
HCT: 39.1 % (ref 39.0–52.0)
Hemoglobin: 13.2 g/dL (ref 13.0–17.0)
Immature Granulocytes: 0 %
Lymphocytes Relative: 29 %
Lymphs Abs: 1.6 10*3/uL (ref 0.7–4.0)
MCH: 33.8 pg (ref 26.0–34.0)
MCHC: 33.8 g/dL (ref 30.0–36.0)
MCV: 100.3 fL — ABNORMAL HIGH (ref 80.0–100.0)
Monocytes Absolute: 0.6 10*3/uL (ref 0.1–1.0)
Monocytes Relative: 10 %
Neutro Abs: 3.3 10*3/uL (ref 1.7–7.7)
Neutrophils Relative %: 60 %
Platelets: 100 10*3/uL — ABNORMAL LOW (ref 150–400)
RBC: 3.9 MIL/uL — ABNORMAL LOW (ref 4.22–5.81)
RDW: 12.9 % (ref 11.5–15.5)
WBC: 5.6 10*3/uL (ref 4.0–10.5)
nRBC: 0 % (ref 0.0–0.2)

## 2020-08-28 LAB — PROTIME-INR
INR: 1 (ref 0.8–1.2)
Prothrombin Time: 13.1 seconds (ref 11.4–15.2)

## 2020-08-28 SURGERY — RADIOLOGY WITH ANESTHESIA
Anesthesia: General

## 2020-08-28 MED ORDER — IOHEXOL 350 MG/ML SOLN
50.0000 mL | Freq: Once | INTRAVENOUS | Status: AC | PRN
  Administered 2020-08-28: 50 mL via INTRAVENOUS

## 2020-08-28 MED ORDER — ROCURONIUM BROMIDE 10 MG/ML (PF) SYRINGE
PREFILLED_SYRINGE | INTRAVENOUS | Status: DC | PRN
  Administered 2020-08-28: 10 mg via INTRAVENOUS
  Administered 2020-08-28: 20 mg via INTRAVENOUS
  Administered 2020-08-28: 70 mg via INTRAVENOUS

## 2020-08-28 MED ORDER — SUGAMMADEX SODIUM 200 MG/2ML IV SOLN
INTRAVENOUS | Status: DC | PRN
  Administered 2020-08-28: 200 mg via INTRAVENOUS

## 2020-08-28 MED ORDER — LIDOCAINE 2% (20 MG/ML) 5 ML SYRINGE
INTRAMUSCULAR | Status: DC | PRN
  Administered 2020-08-28: 40 mg via INTRAVENOUS

## 2020-08-28 MED ORDER — FENTANYL CITRATE (PF) 100 MCG/2ML IJ SOLN: 25.0000 ug | INTRAMUSCULAR | Status: DC | PRN

## 2020-08-28 MED ORDER — CEFAZOLIN SODIUM-DEXTROSE 2-3 GM-%(50ML) IV SOLR
INTRAVENOUS | Status: DC | PRN
  Administered 2020-08-28: 2 g via INTRAVENOUS

## 2020-08-28 MED ORDER — FENTANYL CITRATE (PF) 100 MCG/2ML IJ SOLN
INTRAMUSCULAR | Status: DC | PRN
  Administered 2020-08-28: 50 ug via INTRAVENOUS
  Administered 2020-08-28: 100 ug via INTRAVENOUS
  Administered 2020-08-28 (×2): 50 ug via INTRAVENOUS

## 2020-08-28 MED ORDER — CHLORHEXIDINE GLUCONATE 0.12 % MT SOLN
15.0000 mL | Freq: Once | OROMUCOSAL | Status: AC
  Administered 2020-08-28: 15 mL via OROMUCOSAL

## 2020-08-28 MED ORDER — PROMETHAZINE HCL 25 MG/ML IJ SOLN: 6.2500 mg | INTRAMUSCULAR | Status: DC | PRN

## 2020-08-28 MED ORDER — SODIUM CHLORIDE (PF) 0.9 % IJ SOLN
INTRAMUSCULAR | Status: AC
  Filled 2020-08-28: qty 50

## 2020-08-28 MED ORDER — DEXAMETHASONE SODIUM PHOSPHATE 10 MG/ML IJ SOLN
INTRAMUSCULAR | Status: DC | PRN
  Administered 2020-08-28: 8 mg via INTRAVENOUS

## 2020-08-28 MED ORDER — LACTATED RINGERS IV SOLN: INTRAVENOUS | Status: DC

## 2020-08-28 MED ORDER — MIDAZOLAM HCL 2 MG/2ML IJ SOLN
INTRAMUSCULAR | Status: AC
  Filled 2020-08-28: qty 2

## 2020-08-28 MED ORDER — MEPERIDINE HCL 50 MG/ML IJ SOLN: 6.2500 mg | INTRAMUSCULAR | Status: DC | PRN

## 2020-08-28 MED ORDER — SODIUM CHLORIDE 0.9 % IV SOLN
INTRAVENOUS | Status: AC
  Filled 2020-08-28: qty 250

## 2020-08-28 MED ORDER — PROPOFOL 10 MG/ML IV BOLUS
INTRAVENOUS | Status: DC | PRN
  Administered 2020-08-28: 120 mg via INTRAVENOUS

## 2020-08-28 MED ORDER — ONDANSETRON HCL 4 MG/2ML IJ SOLN
INTRAMUSCULAR | Status: DC | PRN
  Administered 2020-08-28: 4 mg via INTRAVENOUS

## 2020-08-28 MED ORDER — CEFAZOLIN SODIUM-DEXTROSE 2-4 GM/100ML-% IV SOLN
INTRAVENOUS | Status: AC
  Filled 2020-08-28: qty 100

## 2020-08-28 MED ORDER — OXYCODONE HCL 5 MG/5ML PO SOLN: 5.0000 mg | Freq: Once | ORAL | Status: DC | PRN

## 2020-08-28 MED ORDER — FENTANYL CITRATE (PF) 250 MCG/5ML IJ SOLN
INTRAMUSCULAR | Status: AC
  Filled 2020-08-28: qty 5

## 2020-08-28 MED ORDER — MIDAZOLAM HCL 5 MG/5ML IJ SOLN
INTRAMUSCULAR | Status: DC | PRN
  Administered 2020-08-28: 2 mg via INTRAVENOUS

## 2020-08-28 MED ORDER — OXYCODONE HCL 5 MG PO TABS: 5.0000 mg | ORAL_TABLET | Freq: Once | ORAL | Status: DC | PRN

## 2020-08-28 MED ORDER — PROPOFOL 10 MG/ML IV BOLUS
INTRAVENOUS | Status: AC
  Filled 2020-08-28: qty 40

## 2020-08-28 MED ORDER — ORAL CARE MOUTH RINSE: 15.0000 mL | Freq: Once | OROMUCOSAL | Status: AC

## 2020-08-28 MED ORDER — MIDAZOLAM HCL 2 MG/2ML IJ SOLN: 0.5000 mg | Freq: Once | INTRAMUSCULAR | Status: DC | PRN

## 2020-08-28 NOTE — H&P (Signed)
Referring Physician(s): Camillia Herter  Supervising Physician: Daryll Brod  Patient Status:  [facility] TBA  Chief Complaint: Right renal mass   Subjective: Patient familiar to IR service from consultation with Dr. Annamaria Boots on 08/01/20 to discuss treatment options for 1.6 cm right midpole exophytic solid renal mass noted incidentally on imaging work-up for abdominal pain and known incisional hernia.  He has a past medical history significant for anemia, gouty arthritis, chronic kidney disease, GERD, hypertension, hyper lipidemia, nephrolithiasis, peripheral vascular disease, renal insufficiency, thrombocytopenia.  Following discussions with Dr.Shick he was deemed an appropriate candidate for CT-guided biopsy and cryoablation of the right renal mass and presents today for the procedure.  He currently denies fever, headache, chest pain, cough, abdominal/back pain, nausea, vomiting or bleeding.  He does have some chronic dyspnea with exertion secondary to COPD.   Past Medical History:  Diagnosis Date  . Anemia    pernicious anemia  . Chest pain    Unspecified  . Chronic gouty arthritis 12/26/2015  . Chronic kidney disease   . Dysrhythmia    pac's.  okay now.  Marland Kitchen GERD (gastroesophageal reflux disease)   . History of kidney stones   . Hypertension   . Hypertriglyceridemia   . Kidney stone on left side    2018  . Palpitations   . Peripheral vascular disease ([place]) 2019  . Pre-diabetes   . Renal insufficiency   . Renal mass   . Thyroid nodule 2019   being monitored   Past Surgical History:  Procedure Laterality Date  . CYSTOSCOPY W/ RETROGRADES Bilateral 03/23/2017   Procedure: CYSTOSCOPY WITH RETROGRADE PYELOGRAM;  Surgeon: Abbie Sons, MD;  Location: [facility];  Service: Urology;  Laterality: Bilateral;  . CYSTOSCOPY W/ URETERAL STENT PLACEMENT Left 03/31/2017   Procedure: CYSTOSCOPY WITH STENT REPLACEMENT;  Surgeon: Abbie Sons, MD;  Location: [facility];  Service: Urology;   Laterality: Left;  . CYSTOSCOPY/RETROGRADE/URETEROSCOPY/STONE EXTRACTION WITH BASKET Left 03/31/2017   Procedure: CYSTOSCOPY/RETROGRADE/URETEROSCOPY/STONE EXTRACTION WITH BASKET;  Surgeon: Abbie Sons, MD;  Location: [facility];  Service: Urology;  Laterality: Left;  . CYSTOSCOPY/URETEROSCOPY/HOLMIUM LASER/STENT PLACEMENT Left 03/23/2017   Procedure: CYSTOSCOPY/URETEROSCOPY/HOLMIUM LASER/STENT PLACEMENT;  Surgeon: Abbie Sons, MD;  Location: [facility];  Service: Urology;  Laterality: Left;  . ESOPHAGOGASTRODUODENOSCOPY (EGD) WITH PROPOFOL N/A 10/22/2015   Procedure: ESOPHAGOGASTRODUODENOSCOPY (EGD) WITH PROPOFOL;  Surgeon: Lucilla Lame, MD;  Location: [facility];  Service: Endoscopy;  Laterality: N/A;  . EXTRACORPOREAL SHOCK WAVE LITHOTRIPSY  2016  . EXTRACORPOREAL SHOCK WAVE LITHOTRIPSY Left 09/13/2014   Procedure: EXTRACORPOREAL SHOCK WAVE LITHOTRIPSY (ESWL);  Surgeon: Hollice Espy, MD;  Location: [facility];  Service: Urology;  Laterality: Left;  . HERNIA REPAIR    . IR RADIOLOGIST EVAL & MGMT  08/01/2020  . LOWER EXTREMITY ANGIOGRAPHY Left 07/15/2017   Procedure: LOWER EXTREMITY ANGIOGRAPHY;  Surgeon: Algernon Huxley, MD;  Location: [place] [facility];  Service: Cardiovascular;  Laterality: Left;  . lower extremity interventions x2 right leg Right    stent  . PERIPHERAL VASCULAR CATHETERIZATION Right 08/20/2014   Procedure: Lower Extremity Angiography;  Surgeon: Algernon Huxley, MD;  Location: [place] [facility];  Service: Cardiovascular;  Laterality: Right;  . PERIPHERAL VASCULAR CATHETERIZATION Right 08/20/2014   Procedure: Lower Extremity Intervention;  Surgeon: Algernon Huxley, MD;  Location: [place] [facility];  Service: Cardiovascular;  Laterality: Right;  . VENTRAL HERNIA REPAIR  08/11/2019   Procedure: HERNIA REPAIR VENTRAL ADULT WITH REPAIR OF LARGE INTESTINE LACERATION;  Surgeon:  Herbert Pun, MD;  Location: [facility];  Service: General;;  . XI ROBOTIC ASSISTED VENTRAL HERNIA  N/A 08/11/2019   Procedure: XI ROBOTIC ASSISTED VENTRAL HERNIA;  Surgeon: Herbert Pun, MD;  Location: [facility];  Service: General;  Laterality: N/A;  Converted to open procedure      Allergies: Patient has no known allergies.  Medications: Prior to Admission medications   Medication Sig Start Date End Date Taking? Authorizing Provider  amLODipine (NORVASC) 10 MG tablet Take 10 mg by mouth daily. 05/06/17  Yes [provider]  aspirin EC 81 MG tablet Take 81 mg by mouth daily.   Yes [provider]  clopidogrel (PLAVIX) 75 MG tablet Take 75 mg by mouth daily.   Yes [provider]  metoprolol (TOPROL-XL) 200 MG 24 hr tablet Take 200 mg by mouth daily.    Yes [provider]  Multiple Vitamin (MULTIVITAMIN WITH MINERALS) TABS tablet Take 1 tablet by mouth daily.   Yes [provider]  omega-3 acid ethyl esters (LOVAZA) 1 G capsule Take 4 g by mouth daily.    Yes [provider]  pantoprazole (PROTONIX) 40 MG tablet Take 40 mg by mouth daily.   Yes [provider]  rosuvastatin (CRESTOR) 40 MG tablet Take 40 mg by mouth daily.    Yes [provider]  acetaminophen (TYLENOL) 500 MG tablet Take 1,000 mg by mouth every 6 (six) hours as needed for mild pain or moderate pain.    [provider]     Vital Signs: BP (!) 152/90   Pulse 70   Temp 98.8 F (37.1 C) (Oral)   Resp 14   Ht 6' (1.829 m)   Wt 193 lb 3.2 oz (87.6 kg)   SpO2 100%   BMI 26.20 kg/m   Physical Exam awake, alert.  Chest clear to auscultation bilaterally.  Heart with regular rate and rhythm.  Abdomen soft, positive bowel sounds, nontender.  No lower extremity edema.  Imaging: DG Chest 1 View  Result Date: 08/28/2020 CLINICAL DATA:  Preop cryoablation. EXAM: CHEST  1 VIEW COMPARISON:  CT chest 09/21/2019.  Chest x-ray 10/25/2015. FINDINGS: Mediastinum and hilar structures normal. Lungs are clear. No pleural effusion or pneumothorax.  Heart size normal. Degenerative change thoracic spine. IMPRESSION: No acute cardiopulmonary disease. Electronically Signed   By: Marcello Moores  Register   On: 08/28/2020 07:38    Labs:  CBC: Recent Labs    08/20/20 0925 08/28/20 0727  WBC 6.8 5.6  HGB 14.4 13.2  HCT 43.1 39.1  PLT 117* 100*    COAGS: Recent Labs    08/28/20 0727  INR 1.0    BMP: Recent Labs    07/12/20 1420 08/20/20 0925  NA  --  142  K  --  4.3  CL  --  109  CO2  --  23  GLUCOSE  --  143*  BUN  --  15  CALCIUM  --  8.8*  CREATININE 1.00 1.01  GFRNONAA  --  >60    LIVER FUNCTION TESTS: No results for input(s): BILITOT, AST, ALT, ALKPHOS, PROT, ALBUMIN in the last 8760 hours.  Assessment and Plan: Patient familiar to IR service from consultation with Dr. Annamaria Boots on 08/01/20 to discuss treatment options for 1.6 cm right midpole exophytic solid renal mass noted incidentally on imaging work-up for abdominal pain and known incisional hernia.  He has a past medical history significant for anemia, gouty arthritis, chronic kidney disease, GERD, hypertension, hyper lipidemia, nephrolithiasis,  peripheral vascular disease, renal insufficiency, thrombocytopenia.  Following discussions with Dr.Shick he was deemed an appropriate candidate for CT-guided biopsy and cryoablation of the right renal mass and presents today for the procedure.  Details/risks of procedure, including but not limited to, internal bleeding, infection, injury to adjacent structures, anesthesia related complications discussed with patient with his understanding and consent.  Patient has been off anticoagulation for the past 5 days. This procedure involves the use of CT and because of the nature of the planned procedure, it is possible that we will have prolonged use of X-ray fluoroscopy.  Potential radiation risks to you include (but are not limited to) the following: - A slightly elevated risk for cancer  several years later in life. This risk is  typically less than 0.5% percent. This risk is low in comparison to the normal incidence of human cancer, which is 33% for women and 50% for men according to the [place]. - Radiation induced injury can include skin redness, resembling a rash, tissue breakdown / ulcers and hair loss (which can be temporary or permanent).   The likelihood of either of these occurring depends on the difficulty of the procedure and whether you are sensitive to radiation due to previous procedures, disease, or genetic conditions.   IF your procedure requires a prolonged use of radiation, you will be notified and given written instructions for further action.  It is your responsibility to monitor the irradiated area for the 2 weeks following the procedure and to notify your physician if you are concerned that you have suffered a radiation induced injury.     Electronically Signed: D. Rowe Robert, PA-C 08/28/2020, 7:59 AM   I spent a total of 30 minutes at the the patient's bedside AND on the patient's hospital floor or unit, greater than 50% of which was counseling/coordinating care for CT/ultrasound-guided right renal mass cryoablation possible biopsy

## 2020-08-28 NOTE — Anesthesia Postprocedure Evaluation (Signed)
Anesthesia Post Note  Patient: [name] [name]  Procedure(s) Performed: RADIOLOGY WITH ANESTHESIA CT WITH CRYOABLATION (N/A )     Patient location during evaluation: PACU Anesthesia Type: General Level of consciousness: awake and alert, patient cooperative and oriented Pain management: pain level controlled Vital Signs Assessment: post-procedure vital signs reviewed and stable Respiratory status: spontaneous breathing, nonlabored ventilation and respiratory function stable Cardiovascular status: blood pressure returned to baseline and stable Postop Assessment: no apparent nausea or vomiting and able to ambulate Anesthetic complications: no   No complications documented.  Last Vitals:  Vitals:   08/28/20 1205 08/28/20 1300  BP: 139/90 (!) 157/99  Pulse: 67 72  Resp: 16 16  Temp:    SpO2: 95% 91%    Last Pain:  Vitals:   08/28/20 1300  TempSrc:   PainSc: 0-No pain                 Kingsley Farace,E. Raegan Sipp

## 2020-08-28 NOTE — Procedures (Signed)
Interventional Radiology Procedure Note  Procedure: CT RT RENAL MASS CRYO    Complications: None  Estimated Blood Loss:  MIN  Findings: 2 NEEDLE CRYO PERFORMED SUCCESSFULLY FULL REPORT IN PACS     Tamera Punt, MD

## 2020-08-28 NOTE — Discharge Summary (Signed)
Patient ID: [name] [name] MRN: [id number] DOB/AGE: 09/04/1960 60 y.o.  Admit date: 08/28/2020 Discharge date: 08/28/2020  Supervising Physician: Daryll Brod  Patient Status: [name] [name] OP  Admission Diagnoses: Right renal mass  Discharge Diagnoses: Right renal mass, status post CT-guided percutaneous cryoablation on 08/28/20 Active Problems:   Renal cell cancer, right [facility])  Past Medical History:  Diagnosis Date  . Anemia    pernicious anemia  . Chest pain    Unspecified  . Chronic gouty arthritis 12/26/2015  . Chronic kidney disease   . Dysrhythmia    pac's.  okay now.  Marland Kitchen GERD (gastroesophageal reflux disease)   . History of kidney stones   . Hypertension   . Hypertriglyceridemia   . Kidney stone on left side    2018  . Palpitations   . Peripheral vascular disease ([place]) 2019  . Pre-diabetes   . Renal insufficiency   . Renal mass   . Thyroid nodule 2019   being monitored   Past Surgical History:  Procedure Laterality Date  . CYSTOSCOPY W/ RETROGRADES Bilateral 03/23/2017   Procedure: CYSTOSCOPY WITH RETROGRADE PYELOGRAM;  Surgeon: Abbie Sons, MD;  Location: [facility];  Service: Urology;  Laterality: Bilateral;  . CYSTOSCOPY W/ URETERAL STENT PLACEMENT Left 03/31/2017   Procedure: CYSTOSCOPY WITH STENT REPLACEMENT;  Surgeon: Abbie Sons, MD;  Location: [facility];  Service: Urology;  Laterality: Left;  . CYSTOSCOPY/RETROGRADE/URETEROSCOPY/STONE EXTRACTION WITH BASKET Left 03/31/2017   Procedure: CYSTOSCOPY/RETROGRADE/URETEROSCOPY/STONE EXTRACTION WITH BASKET;  Surgeon: Abbie Sons, MD;  Location: [facility];  Service: Urology;  Laterality: Left;  . CYSTOSCOPY/URETEROSCOPY/HOLMIUM LASER/STENT PLACEMENT Left 03/23/2017   Procedure: CYSTOSCOPY/URETEROSCOPY/HOLMIUM LASER/STENT PLACEMENT;  Surgeon: Abbie Sons, MD;  Location: [facility];  Service: Urology;  Laterality: Left;  . ESOPHAGOGASTRODUODENOSCOPY (EGD) WITH PROPOFOL N/A 10/22/2015   Procedure:  ESOPHAGOGASTRODUODENOSCOPY (EGD) WITH PROPOFOL;  Surgeon: Lucilla Lame, MD;  Location: [facility];  Service: Endoscopy;  Laterality: N/A;  . EXTRACORPOREAL SHOCK WAVE LITHOTRIPSY  2016  . EXTRACORPOREAL SHOCK WAVE LITHOTRIPSY Left 09/13/2014   Procedure: EXTRACORPOREAL SHOCK WAVE LITHOTRIPSY (ESWL);  Surgeon: Hollice Espy, MD;  Location: [facility];  Service: Urology;  Laterality: Left;  . HERNIA REPAIR    . IR RADIOLOGIST EVAL & MGMT  08/01/2020  . LOWER EXTREMITY ANGIOGRAPHY Left 07/15/2017   Procedure: LOWER EXTREMITY ANGIOGRAPHY;  Surgeon: Algernon Huxley, MD;  Location: [place] [facility];  Service: Cardiovascular;  Laterality: Left;  . lower extremity interventions x2 right leg Right    stent  . PERIPHERAL VASCULAR CATHETERIZATION Right 08/20/2014   Procedure: Lower Extremity Angiography;  Surgeon: Algernon Huxley, MD;  Location: [place] [facility];  Service: Cardiovascular;  Laterality: Right;  . PERIPHERAL VASCULAR CATHETERIZATION Right 08/20/2014   Procedure: Lower Extremity Intervention;  Surgeon: Algernon Huxley, MD;  Location: [place] [facility];  Service: Cardiovascular;  Laterality: Right;  . VENTRAL HERNIA REPAIR  08/11/2019   Procedure: HERNIA REPAIR VENTRAL ADULT WITH REPAIR OF LARGE INTESTINE LACERATION;  Surgeon: Herbert Pun, MD;  Location: [facility];  Service: General;;  . XI ROBOTIC ASSISTED VENTRAL HERNIA N/A 08/11/2019   Procedure: XI ROBOTIC ASSISTED VENTRAL HERNIA;  Surgeon: Herbert Pun, MD;  Location: [facility];  Service: General;  Laterality: N/A;  Converted to open procedure     Discharged Condition: good  Hospital Course: [name] [name] is a 60 year old male with history of 1.6 cm right midpole exophytic solid renal mass concerning for renal cell cancer and noted incidentally on imaging workup for abdominal  pain and incisional hernia.  He was seen in consultation by Dr. Annamaria Boots on 08/01/2020 to discuss treatment options.  He was deemed an appropriate candidate for  CT-guided cryoablation /possible biopsy of the renal mass .  On 08/28/2020 he underwent CT-guided percutaneous cryoablation of the right renal mass via general anesthesia.   The procedure was performed without immediate complications and he was observed in the PACU for several hours prior to discharge home. He was seen by Dr. Annamaria Boots and deemed stable for discharge home. He denied fever,HA,CP,worsening dyspnea, cough, abd/back pain,N/V or bleeding. Urine was yellow. Pt was able to tolerate food and void without difficulty.  He will resume home meds and specifically restart plavix and aspirin on 5/26. He will f/u with Dr. Annamaria Boots in 3-4 weeks. He was told to contact our service with any additional questions.    Consults: anesthesia  Significant Diagnostic Studies:  Results for orders placed or performed during the hospital encounter of 08/28/20  CBC with Differential/Platelet  Result Value Ref Range   WBC 5.6 4.0 - 10.5 K/uL   RBC 3.90 (L) 4.22 - 5.81 MIL/uL   Hemoglobin 13.2 13.0 - 17.0 g/dL   HCT 39.1 39.0 - 52.0 %   MCV 100.3 (H) 80.0 - 100.0 fL   MCH 33.8 26.0 - 34.0 pg   MCHC 33.8 30.0 - 36.0 g/dL   RDW 12.9 11.5 - 15.5 %   Platelets 100 (L) 150 - 400 K/uL   nRBC 0.0 0.0 - 0.2 %   Neutrophils Relative % 60 %   Neutro Abs 3.3 1.7 - 7.7 K/uL   Lymphocytes Relative 29 %   Lymphs Abs 1.6 0.7 - 4.0 K/uL   Monocytes Relative 10 %   Monocytes Absolute 0.6 0.1 - 1.0 K/uL   Eosinophils Relative 1 %   Eosinophils Absolute 0.1 0.0 - 0.5 K/uL   Basophils Relative 0 %   Basophils Absolute 0.0 0.0 - 0.1 K/uL   Immature Granulocytes 0 %   Abs Immature Granulocytes 0.02 0.00 - 0.07 K/uL  Protime-INR  Result Value Ref Range   Prothrombin Time 13.1 11.4 - 15.2 seconds   INR 1.0 0.8 - 1.2     Treatments: CT guided percutaneous cryoablation of right renal mass on 08/28/20 via general anesthesia  Discharge Exam: Blood pressure 135/73, pulse 79, temperature 98 F (36.7 C), resp. rate 16, height 6'  (1.829 m), weight 193 lb 3.2 oz (87.6 kg), SpO2 95 %. Awake, alert.  Chest clear to auscultation bilaterally.  Heart with regular rate and rhythm.  Abdomen soft, positive bowel sounds, nontender.  Puncture site right flank clean, dry, nontender, no hematoma.  No lower extremity edema.  Disposition:  Allergies as of 08/28/2020   No Known Allergies     Medication List    TAKE these medications   acetaminophen 500 MG tablet Commonly known as: TYLENOL Take 1,000 mg by mouth every 6 (six) hours as needed for mild pain or moderate pain.   amLODipine 10 MG tablet Commonly known as: NORVASC Take 10 mg by mouth daily.   aspirin EC 81 MG tablet Take 81 mg by mouth daily.   clopidogrel 75 MG tablet Commonly known as: PLAVIX Take 75 mg by mouth daily.   metoprolol 200 MG 24 hr tablet Commonly known as: TOPROL-XL Take 200 mg by mouth daily.   multivitamin with minerals Tabs tablet Take 1 tablet by mouth daily.   omega-3 acid ethyl esters 1 g capsule Commonly known as: LOVAZA Take  4 g by mouth daily.   pantoprazole 40 MG tablet Commonly known as: PROTONIX Take 40 mg by mouth daily.   rosuvastatin 40 MG tablet Commonly known as: CRESTOR Take 40 mg by mouth daily.       Follow-up Information    Greggory Keen, MD Follow up.   Specialties: Interventional Radiology, Radiology Why: Radiology department will call you with follow-up appointment with Dr. Annamaria Boots in 3 to 4 weeks; call [phone] or [phone] with any questions. Contact information: [place] [address] [phone]        Hollice Espy, MD Follow up.   Specialty: Urology Why: Follow-up with Dr. Erlene Quan as scheduled Contact information: [place] [address] [phone]                Electronically Signed: D. Rowe Robert, PA-C 08/28/2020, 3:47 PM   I have spent Less Than 30 Minutes discharging [name] [name].

## 2020-08-28 NOTE — Transfer of Care (Signed)
Immediate Anesthesia Transfer of Care Note  Patient: [name] [name]  Procedure(s) Performed: Procedure(s): RADIOLOGY WITH ANESTHESIA CT WITH CRYOABLATION (N/A)  Patient Location: PACU  Anesthesia Type:General  Level of Consciousness:  sedated, patient cooperative and responds to stimulation  Airway & Oxygen Therapy:Patient Spontanous Breathing and Patient connected to face mask oxgen  Post-op Assessment:  Report given to PACU RN and Post -op Vital signs reviewed and stable  Post vital signs:  Reviewed and stable  Last Vitals:  Vitals:   08/28/20 0713  BP: (!) 152/90  Pulse: 70  Resp: 14  Temp: 37.1 C  SpO2: 484%    Complications: No apparent anesthesia complications

## 2020-08-28 NOTE — Discharge Instructions (Signed)
Cryoablation, Care After This sheet gives you information about how to care for yourself after your procedure. Your health care provider may also give you more specific instructions. If you have problems or questions, contact your health care provider. What can I expect after the procedure? After the procedure, it is common to have:  Soreness around the treatment area.  Mild pain and swelling in the treatment area. Follow these instructions at home: Treatment area care  If you have an incision, follow instructions from your health care provider about how to take care of it. Make sure you: ? Wash your hands with soap and water for at least 20 seconds before and after you change your bandage (dressing). If soap and water are not available, use hand sanitizer. ? Change your dressing as told by your health care provider. ? Leave stitches (sutures), skin glue, or adhesive strips in place. These skin closures may need to stay in place for 2 weeks or longer. If adhesive strip edges start to loosen and curl up, you may trim the loose edges. Do not remove adhesive strips completely unless your health care provider tells you to do that.  Check your treatment area every day for signs of infection. Check for: ? More redness, swelling, or pain. ? Fluid or blood. ? Warmth. ? Pus or a bad smell.  Keep the treated area clean, dry, and covered with a dressing until it has healed. Clean the area with soap and water or as told by your health care provider. If your bandage gets wet, change it right away.  Do not take baths, swim, or use a hot tub until your health care provider approves. Ask your health care provider if you may take showers. You may only be allowed to take sponge baths.   Activity  Follow instructions from your health care provider about any activity limitations, including lifting heavy objects.  If you were given a sedative during the procedure, it can affect you for several hours. Do not  drive or operate machinery until your health care provider says that it is safe.   General instructions  Take over-the-counter and prescription medicines only as told by your health care provider.  Do not use any products that contain nicotine or tobacco, such as cigarettes, e-cigarettes, and chewing tobacco. These can delay incision healing. If you need help quitting, ask your health care provider.  Keep all follow-up visits as told by your health care provider. This is important. Contact a health care provider if:  You have increased pain.  You have a fever.  You have nausea or vomiting.  You have any of these signs of infection: ? More redness, swelling, or pain around your treatment area. ? Fluid or blood coming from your treatment area. ? Warmth coming from your treatment area. ? Pus or a bad smell coming from your treatment area.  You do not have a bowel movement for 2 days. Get help right away if you have:  Severe pain.  Trouble swallowing or breathing.  Severe weakness or dizziness.  Chest pain or shortness of breath. These symptoms may represent a serious problem that is an emergency. Do not wait to see if the symptoms will go away. Get medical help right away. Call your local emergency services (911 in the [place] Do not drive yourself to the hospital. Summary  After the procedure, it is common for the treatment area to be sore, mildly painful, and swollen.  If you have a dressing, change  it as told by your health care provider.  Follow instructions from your health care provider about any activity limitations.  Contact a health care provider if you have increased pain or a fever. This information is not intended to replace advice given to you by your health care provider. Make sure you discuss any questions you have with your health care provider. Document Revised: 12/29/2018 Document Reviewed: 12/29/2018 Elsevier Patient Education  [place].

## 2020-08-28 NOTE — Anesthesia Procedure Notes (Signed)
Procedure Name: Intubation Date/Time: 08/28/2020 8:46 AM Performed by: Lavina Hamman, CRNA Pre-anesthesia Checklist: Patient identified, Emergency Drugs available, Suction available, Patient being monitored and Timeout performed Patient Re-evaluated:Patient Re-evaluated prior to induction Oxygen Delivery Method: Circle system utilized Preoxygenation: Pre-oxygenation with 100% oxygen Induction Type: IV induction Ventilation: Mask ventilation without difficulty Laryngoscope Size: Mac and 3 Grade View: Grade II Tube type: Oral Tube size: 7.5 mm Number of attempts: 1 Airway Equipment and Method: Stylet Placement Confirmation: ETT inserted through vocal cords under direct vision,  positive ETCO2,  CO2 detector and breath sounds checked- equal and bilateral Secured at: 23 cm Tube secured with: Tape Dental Injury: Teeth and Oropharynx as per pre-operative assessment  Comments: ATOI

## 2020-08-29 ENCOUNTER — Encounter (HOSPITAL_COMMUNITY): Payer: Self-pay | Admitting: Interventional Radiology

## 2020-09-04 ENCOUNTER — Ambulatory Visit: Payer: Managed Care, Other (non HMO) | Admitting: Podiatry

## 2020-09-10 ENCOUNTER — Encounter: Payer: Self-pay | Admitting: Urology

## 2020-09-12 NOTE — Telephone Encounter (Signed)
Spoke with patient. Virtual apt scheduled next week w/Dr. Annamaria Boots. Will check on follow up with our office and call back to confirm

## 2020-09-13 ENCOUNTER — Other Ambulatory Visit: Payer: Self-pay

## 2020-09-16 ENCOUNTER — Other Ambulatory Visit: Payer: Self-pay

## 2020-09-17 ENCOUNTER — Ambulatory Visit (INDEPENDENT_AMBULATORY_CARE_PROVIDER_SITE_OTHER): Payer: Managed Care, Other (non HMO)

## 2020-09-17 ENCOUNTER — Ambulatory Visit (INDEPENDENT_AMBULATORY_CARE_PROVIDER_SITE_OTHER): Payer: Managed Care, Other (non HMO) | Admitting: Vascular Surgery

## 2020-09-17 ENCOUNTER — Other Ambulatory Visit: Payer: Self-pay

## 2020-09-17 ENCOUNTER — Encounter: Payer: Self-pay | Admitting: *Deleted

## 2020-09-17 ENCOUNTER — Ambulatory Visit
Admission: RE | Admit: 2020-09-17 | Discharge: 2020-09-17 | Disposition: A | Discharge status: Home / Self Care | Payer: Managed Care, Other (non HMO) | Source: Ambulatory Visit | Attending: Radiology | Admitting: Radiology

## 2020-09-17 VITALS — BP 131/76 | HR 70 | Resp 16 | Ht 72.0 in | Wt 193.0 lb

## 2020-09-17 DIAGNOSIS — I70213 Atherosclerosis of native arteries of extremities with intermittent claudication, bilateral legs: Secondary | ICD-10-CM

## 2020-09-17 DIAGNOSIS — I10 Essential (primary) hypertension: Secondary | ICD-10-CM | POA: Diagnosis not present

## 2020-09-17 DIAGNOSIS — M7989 Other specified soft tissue disorders: Secondary | ICD-10-CM | POA: Diagnosis not present

## 2020-09-17 DIAGNOSIS — I6523 Occlusion and stenosis of bilateral carotid arteries: Secondary | ICD-10-CM

## 2020-09-17 DIAGNOSIS — N2889 Other specified disorders of kidney and ureter: Secondary | ICD-10-CM

## 2020-09-17 HISTORY — PX: IR RADIOLOGIST EVAL & MGMT: IMG5224

## 2020-09-17 NOTE — Progress Notes (Signed)
Patient ID: [name] [name], male   DOB: 11-26-1960, 60 y.o.   MRN: [id number]       Chief Complaint:  Right renal mass consistent with renal cell carcinoma by imaging  Referring Physician(s): Dr. Erlene Quan  History of Present Illness: [name] [name] is a 60 y.o. male with multiple comorbidities including nephrolithiasis, hypertension, peripheral vascular disease.  CT imaging performed for abdominal pain and known incisional hernia revealed a new 1.6 cm lateral enhancing right renal solid mass consistent with renal neoplasm by CT.  Lesion size and location were good for image guided cryoablation.  He underwent CT-guided right renal mass cryoablation at [facility] 08/28/2020.  Procedure went very well.  He was able to be discharged same day.  He has recovered at home and is back to work.  No physical limitations.  No current symptoms.  No abdominal pain or flank pain.  No dysuria hematuria.  No recent illness or fevers.  Overall he is doing very well.  No interval labs or imaging at this point.  Past Medical History:  Diagnosis Date   Anemia    pernicious anemia   Chest pain    Unspecified   Chronic gouty arthritis 12/26/2015   Chronic kidney disease    Dysrhythmia    pac's.  okay now.   GERD (gastroesophageal reflux disease)    History of kidney stones    Hypertension    Hypertriglyceridemia    Kidney stone on left side    2018   Palpitations    Peripheral vascular disease ([place]) 2019   Pre-diabetes    Renal insufficiency    Renal mass    Thyroid nodule 2019   being monitored    Past Surgical History:  Procedure Laterality Date   CYSTOSCOPY W/ RETROGRADES Bilateral 03/23/2017   Procedure: CYSTOSCOPY WITH RETROGRADE PYELOGRAM;  Surgeon: Abbie Sons, MD;  Location: [facility];  Service: Urology;  Laterality: Bilateral;   CYSTOSCOPY W/ URETERAL STENT PLACEMENT Left 03/31/2017   Procedure: CYSTOSCOPY WITH STENT REPLACEMENT;  Surgeon: Abbie Sons, MD;   Location: [facility];  Service: Urology;  Laterality: Left;   CYSTOSCOPY/RETROGRADE/URETEROSCOPY/STONE EXTRACTION WITH BASKET Left 03/31/2017   Procedure: CYSTOSCOPY/RETROGRADE/URETEROSCOPY/STONE EXTRACTION WITH BASKET;  Surgeon: Abbie Sons, MD;  Location: [facility];  Service: Urology;  Laterality: Left;   CYSTOSCOPY/URETEROSCOPY/HOLMIUM LASER/STENT PLACEMENT Left 03/23/2017   Procedure: CYSTOSCOPY/URETEROSCOPY/HOLMIUM LASER/STENT PLACEMENT;  Surgeon: Abbie Sons, MD;  Location: [facility];  Service: Urology;  Laterality: Left;   ESOPHAGOGASTRODUODENOSCOPY (EGD) WITH PROPOFOL N/A 10/22/2015   Procedure: ESOPHAGOGASTRODUODENOSCOPY (EGD) WITH PROPOFOL;  Surgeon: Lucilla Lame, MD;  Location: [facility];  Service: Endoscopy;  Laterality: N/A;   EXTRACORPOREAL SHOCK WAVE LITHOTRIPSY  2016   EXTRACORPOREAL SHOCK WAVE LITHOTRIPSY Left 09/13/2014   Procedure: EXTRACORPOREAL SHOCK WAVE LITHOTRIPSY (ESWL);  Surgeon: Hollice Espy, MD;  Location: [facility];  Service: Urology;  Laterality: Left;   HERNIA REPAIR     IR RADIOLOGIST EVAL & MGMT  08/01/2020   LOWER EXTREMITY ANGIOGRAPHY Left 07/15/2017   Procedure: LOWER EXTREMITY ANGIOGRAPHY;  Surgeon: Algernon Huxley, MD;  Location: [place] [facility];  Service: Cardiovascular;  Laterality: Left;   lower extremity interventions x2 right leg Right    stent   PERIPHERAL VASCULAR CATHETERIZATION Right 08/20/2014   Procedure: Lower Extremity Angiography;  Surgeon: Algernon Huxley, MD;  Location: [place] [facility];  Service: Cardiovascular;  Laterality: Right;   PERIPHERAL VASCULAR CATHETERIZATION Right 08/20/2014   Procedure: Lower Extremity Intervention;  Surgeon: Corene Cornea  Bunnie Domino, MD;  Location: [place] [facility];  Service: Cardiovascular;  Laterality: Right;   RADIOLOGY WITH ANESTHESIA N/A 08/28/2020   Procedure: RADIOLOGY WITH ANESTHESIA CT WITH CRYOABLATION;  Surgeon: Greggory Keen, MD;  Location: [facility];  Service: Anesthesiology;  Laterality: N/A;   VENTRAL  HERNIA REPAIR  08/11/2019   Procedure: HERNIA REPAIR VENTRAL ADULT WITH REPAIR OF LARGE INTESTINE LACERATION;  Surgeon: Herbert Pun, MD;  Location: [facility];  Service: General;;   XI ROBOTIC ASSISTED VENTRAL HERNIA N/A 08/11/2019   Procedure: XI ROBOTIC ASSISTED VENTRAL HERNIA;  Surgeon: Herbert Pun, MD;  Location: [facility];  Service: General;  Laterality: N/A;  Converted to open procedure    Allergies: Patient has no known allergies.  Medications: Prior to Admission medications   Medication Sig Start Date End Date Taking? Authorizing Provider  acetaminophen (TYLENOL) 500 MG tablet Take 1,000 mg by mouth every 6 (six) hours as needed for mild pain or moderate pain.    [provider]  amLODipine (NORVASC) 10 MG tablet Take 10 mg by mouth daily. 05/06/17   [provider]  aspirin EC 81 MG tablet Take 81 mg by mouth daily.    [provider]  clopidogrel (PLAVIX) 75 MG tablet Take 75 mg by mouth daily.    [provider]  metoprolol (TOPROL-XL) 200 MG 24 hr tablet Take 200 mg by mouth daily.     [provider]  Multiple Vitamin (MULTIVITAMIN WITH MINERALS) TABS tablet Take 1 tablet by mouth daily.    [provider]  omega-3 acid ethyl esters (LOVAZA) 1 G capsule Take 4 g by mouth daily.     [provider]  pantoprazole (PROTONIX) 40 MG tablet Take 40 mg by mouth daily.    [provider]  rosuvastatin (CRESTOR) 40 MG tablet Take 40 mg by mouth daily.     [provider]     Family History  Problem Relation Age of Onset   Heart failure Other     Social History   Socioeconomic History   Marital status: Married    Spouse name: Not on file   Number of children: Not on file   Years of education: Not on file   Highest education level: Not on file  Occupational History   Occupation: Full Time  Tobacco Use   Smoking status: Former    Packs/day: 1.00    Years: 37.00    Pack years: 37.00     Types: Cigarettes    Quit date: 04/21/2013    Years since quitting: 7.4   Smokeless tobacco: Never  Vaping Use   Vaping Use: Never used  Substance and Sexual Activity   Alcohol use: Yes    Alcohol/week: 14.0 standard drinks    Types: 7 Glasses of wine, 7 Cans of beer per week    Comment: trying to cut back on daily consumption   Drug use: No   Sexual activity: Yes  Other Topics Concern   Not on file  Social History Narrative   Regular exercise: No   Social Determinants of Health   Financial Resource Strain: Not on file  Food Insecurity: Not on file  Transportation Needs: Not on file  Physical Activity: Not on file  Stress: Not on file  Social Connections: Not on file     Review of Systems  Review of Systems: A 12 point ROS discussed and pertinent positives are indicated in the HPI above.  All other systems are negative.  Physical  Exam No direct physical exam was performed, telephone health visit only today Vital Signs: There were no vitals taken for this visit.  Imaging: DG Chest 1 View  Result Date: 08/28/2020 CLINICAL DATA:  Preop cryoablation. EXAM: CHEST  1 VIEW COMPARISON:  CT chest 09/21/2019.  Chest x-ray 10/25/2015. FINDINGS: Mediastinum and hilar structures normal. Lungs are clear. No pleural effusion or pneumothorax. Heart size normal. Degenerative change thoracic spine. IMPRESSION: No acute cardiopulmonary disease. Electronically Signed   By: Marcello Moores  Register   On: 08/28/2020 07:38   CT GUIDE TISSUE ABLATION  Result Date: 08/28/2020 INDICATION: New 2 cm solid enhancing right renal mass compatible with renal neoplasm by imaging. EXAM: CT-GUIDED PERCUTANEOUS CRYOABLATION OF 2 CM LATERAL RIGHT RENAL MASS ANESTHESIA/SEDATION: General MEDICATIONS: ANCEF 2 G. The antibiotic was administered in an appropriate time interval prior to needle puncture of the skin. CONTRAST:  60mL OMNIPAQUE IOHEXOL 350 MG/ML SOLN PROCEDURE: The procedure, risks, benefits, and  alternatives were explained to the patient. Questions regarding the procedure were encouraged and answered. The patient understands and consents to the procedure. The patient was placed under general anesthesia. Initial unenhanced CT was performed in a RIGHT ANTERIOR OBLIQUE position to localize the 2 cm right mid pole solid enhancing exophytic renal mass. Overlying skin marked. The patient was prepped with ChloraPrep in a sterile fashion, and a sterile drape was applied covering the operative field. A sterile gown and sterile gloves were used for the procedure. Under CT guidance, 2 ice pearl 2.1 CX 14 gauge percutaneous cryoablation probes were advanced into the 2 cm right mid pole renal mass. Probe positioning was confirmed by CT prior to cryoablation. Needles were placed in a craniocaudal fashion separated by 1 cm. Cryoablation was performed through the 2 ice pearl 2.1 CX probes. Initial 10 minute cycle of cryoablation was performed. This was followed by a 8 minute thaw cycle. A second 10 minute cycle of cryoablation was then performed. During ablation, periodic CT imaging was performed to monitor ice ball formation and morphology. After active thaw and cautery, the cryoablation probes were removed. Post-procedural CT was performed. COMPLICATIONS: None immediate. FINDINGS: Imaging confirms 2 needle placement within the 2 cm right mid pole exophytic renal mass. Needles were placed in a craniocaudal fashion separated by 1 cm. CT monitoring during the exam confirms adequate ice coverage of the lesion. 50 cc contrast was administered again confirming adequate ice coverage of the lesion during treatment phases. Following 2 cycles of treatment, active thaw, and cautery postprocedure imaging demonstrates no surrounding hemorrhage or hematoma. No other complication. IMPRESSION: CT guided percutaneous cryoablation of the 2 cm right mid pole renal mass. The patient will be observed overnight. Initial follow-up will be  performed in approximately 4 weeks. Electronically Signed   By: Jerilynn Mages.  Eddie Payette M.D.   On: 08/28/2020 11:32    Labs:  CBC: Recent Labs    08/20/20 0925 08/28/20 0727  WBC 6.8 5.6  HGB 14.4 13.2  HCT 43.1 39.1  PLT 117* 100*    COAGS: Recent Labs    08/28/20 0727  INR 1.0    BMP: Recent Labs    07/12/20 1420 08/20/20 0925  NA  --  142  K  --  4.3  CL  --  109  CO2  --  23  GLUCOSE  --  143*  BUN  --  15  CALCIUM  --  8.8*  CREATININE 1.00 1.01  GFRNONAA  --  >60    LIVER FUNCTION TESTS: No results  for input(s): BILITOT, AST, ALT, ALKPHOS, PROT, ALBUMIN in the last 8760 hours.   Assessment and Plan:  3-week status post right renal neoplasm CT-guided cryoablation.  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  Overall he has recovered very well.  He remains asymptomatic.  No imaging at this point.  Plan: Scheduled for outpatient surveillance CT without and with contrast at [facility] in 3 months.  (September 2022)  Electronically Signed: Greggory Keen 09/17/2020, 9:38 AM   I spent a total of    40 Minutes in remote  clinical consultation, greater than 50% of which was counseling/coordinating care for this patient status post right renal mass cryoablation.    Visit type: Audio only (telephone). Audio (no video) only due to patient's lack of internet/smartphone capability. Alternative for in-person consultation at [facility], [address] [place], [place], [place]. This visit type was conducted due to national recommendations for restrictions regarding the COVID-19 Pandemic (e.g. social distancing).  This format is felt to be most appropriate for this patient at this time.  All issues noted in this document were discussed and addressed.

## 2020-09-17 NOTE — Assessment & Plan Note (Signed)
blood pressure control important in reducing the progression of atherosclerotic disease. On appropriate oral medications.  

## 2020-09-17 NOTE — Assessment & Plan Note (Signed)
ABIs today are 1.2 bilaterally with multiphasic waveforms and normal digital pressures bilaterally.  No current worrisome symptoms.  On appropriate medical therapy.  Recheck in 1 year.

## 2020-09-17 NOTE — Progress Notes (Signed)
MRN : [id number]  [name] [name] is a 60 y.o. (Jan 15, 1961) male who presents with chief complaint of  Chief Complaint  Patient presents with   Follow-up    ultrasound  .  History of Present Illness: Patient returns in follow-up today of multiple vascular issues.  Since his last visit, he had an incidental finding of a right renal mass that was treated with cryoablation and seems to be doing well. He is studied for carotid disease today.  He denies any focal neurologic symptoms. Specifically, the patient denies amaurosis fugax, speech or swallowing difficulties, or arm or leg weakness or numbness. Carotid duplex reveals stable, 1 to 39% ICA stenosis bilaterally.  He is also studied for peripheral arterial disease today.  He has undergone previous revascularization of the lower extremities for disabling claudication.  At current, he really does not have much in the way of claudication symptoms.  No rest pain or ulceration.  ABIs today are 1.2 bilaterally with multiphasic waveforms and normal digital pressures bilaterally. He is also followed for swelling.  He does have a lymphedema pump but has not had to use it much recently.  He had a brace for his left ankle and that in addition to wearing his compression socks daily has kept his swelling under good control.  Current Outpatient Medications  Medication Sig Dispense Refill   amLODipine (NORVASC) 10 MG tablet Take 10 mg by mouth daily.     aspirin EC 81 MG tablet Take 81 mg by mouth daily.     clopidogrel (PLAVIX) 75 MG tablet Take 75 mg by mouth daily.     metoprolol (TOPROL-XL) 200 MG 24 hr tablet Take 200 mg by mouth daily.      Multiple Vitamin (MULTIVITAMIN WITH MINERALS) TABS tablet Take 1 tablet by mouth daily.     omega-3 acid ethyl esters (LOVAZA) 1 G capsule Take 4 g by mouth daily.      pantoprazole (PROTONIX) 40 MG tablet Take 40 mg by mouth daily.     rosuvastatin (CRESTOR) 40 MG tablet Take 40 mg by mouth daily.      No  current facility-administered medications for this visit.    Past Medical History:  Diagnosis Date   Anemia    pernicious anemia   Chest pain    Unspecified   Chronic gouty arthritis 12/26/2015   Chronic kidney disease    Dysrhythmia    pac's.  okay now.   GERD (gastroesophageal reflux disease)    History of kidney stones    Hypertension    Hypertriglyceridemia    Kidney stone on left side    2018   Palpitations    Peripheral vascular disease ([place]) 2019   Pre-diabetes    Renal insufficiency    Renal mass    Thyroid nodule 2019   being monitored    Past Surgical History:  Procedure Laterality Date   CYSTOSCOPY W/ RETROGRADES Bilateral 03/23/2017   Procedure: CYSTOSCOPY WITH RETROGRADE PYELOGRAM;  Surgeon: Abbie Sons, MD;  Location: [facility];  Service: Urology;  Laterality: Bilateral;   CYSTOSCOPY W/ URETERAL STENT PLACEMENT Left 03/31/2017   Procedure: CYSTOSCOPY WITH STENT REPLACEMENT;  Surgeon: Abbie Sons, MD;  Location: [facility];  Service: Urology;  Laterality: Left;   CYSTOSCOPY/RETROGRADE/URETEROSCOPY/STONE EXTRACTION WITH BASKET Left 03/31/2017   Procedure: CYSTOSCOPY/RETROGRADE/URETEROSCOPY/STONE EXTRACTION WITH BASKET;  Surgeon: Abbie Sons, MD;  Location: [facility];  Service: Urology;  Laterality: Left;   CYSTOSCOPY/URETEROSCOPY/HOLMIUM LASER/STENT PLACEMENT Left 03/23/2017   Procedure:  CYSTOSCOPY/URETEROSCOPY/HOLMIUM LASER/STENT PLACEMENT;  Surgeon: Abbie Sons, MD;  Location: [facility];  Service: Urology;  Laterality: Left;   ESOPHAGOGASTRODUODENOSCOPY (EGD) WITH PROPOFOL N/A 10/22/2015   Procedure: ESOPHAGOGASTRODUODENOSCOPY (EGD) WITH PROPOFOL;  Surgeon: Lucilla Lame, MD;  Location: [facility];  Service: Endoscopy;  Laterality: N/A;   EXTRACORPOREAL SHOCK WAVE LITHOTRIPSY  2016   EXTRACORPOREAL SHOCK WAVE LITHOTRIPSY Left 09/13/2014   Procedure: EXTRACORPOREAL SHOCK WAVE LITHOTRIPSY (ESWL);  Surgeon: Hollice Espy, MD;  Location: [facility];   Service: Urology;  Laterality: Left;   HERNIA REPAIR     IR RADIOLOGIST EVAL & MGMT  08/01/2020   IR RADIOLOGIST EVAL & MGMT  09/17/2020   LOWER EXTREMITY ANGIOGRAPHY Left 07/15/2017   Procedure: LOWER EXTREMITY ANGIOGRAPHY;  Surgeon: Algernon Huxley, MD;  Location: [facility];  Service: Cardiovascular;  Laterality: Left;   lower extremity interventions x2 right leg Right    stent   PERIPHERAL VASCULAR CATHETERIZATION Right 08/20/2014   Procedure: Lower Extremity Angiography;  Surgeon: Algernon Huxley, MD;  Location: [facility];  Service: Cardiovascular;  Laterality: Right;   PERIPHERAL VASCULAR CATHETERIZATION Right 08/20/2014   Procedure: Lower Extremity Intervention;  Surgeon: Algernon Huxley, MD;  Location: [facility];  Service: Cardiovascular;  Laterality: Right;   RADIOLOGY WITH ANESTHESIA N/A 08/28/2020   Procedure: RADIOLOGY WITH ANESTHESIA CT WITH CRYOABLATION;  Surgeon: Greggory Keen, MD;  Location: [facility];  Service: Anesthesiology;  Laterality: N/A;   VENTRAL HERNIA REPAIR  08/11/2019   Procedure: HERNIA REPAIR VENTRAL ADULT WITH REPAIR OF LARGE INTESTINE LACERATION;  Surgeon: Herbert Pun, MD;  Location: [facility];  Service: General;;   XI ROBOTIC ASSISTED VENTRAL HERNIA N/A 08/11/2019   Procedure: XI ROBOTIC ASSISTED VENTRAL HERNIA;  Surgeon: Herbert Pun, MD;  Location: [facility];  Service: General;  Laterality: N/A;  Converted to open procedure    Social History         Tobacco Use   Smoking status: Former Smoker      Packs/day: 1.00      Years: 37.00      Pack years: 37.00      Quit date: 04/21/2013      Years since quitting: 6.4   Smokeless tobacco: Never Used  Vaping Use   Vaping Use: Never used  Substance Use Topics   Alcohol use: Yes      Alcohol/week: 14.0 standard drinks      Types: 7 Glasses of wine, 7 Cans of beer per week      Comment: trying to cut back on daily consumption   Drug use: No               Family History   Problem Relation Age of Onset   Heart failure Other    No bleeding disorders, clotting disorders, or aneurysms     No Known Allergies     REVIEW OF SYSTEMS (Negative unless checked)   Constitutional: [] Weight loss  [] Fever  [] Chills Cardiac: [] Chest pain   [] Chest pressure   [] Palpitations   [] Shortness of breath when laying flat   [] Shortness of breath at rest   [] Shortness of breath with exertion. Vascular:  [x] Pain in legs with walking   [] Pain in legs at rest   [] Pain in legs when laying flat   [] Claudication   [] Pain in feet when walking  [] Pain in feet at rest  [] Pain in feet when laying flat   [] History of DVT   [] Phlebitis   [x] Swelling in legs   []   Varicose veins   [] Non-healing ulcers Pulmonary:   [] Uses home oxygen   [] Productive cough   [] Hemoptysis   [] Wheeze  [] COPD   [] Asthma Neurologic:  [] Dizziness  [] Blackouts   [] Seizures   [] History of stroke   [] History of TIA  [] Aphasia   [] Temporary blindness   [] Dysphagia   [] Weakness or numbness in arms   [] Weakness or numbness in legs Musculoskeletal:  [x] Arthritis   [] Joint swelling   [] Joint pain   [] Low back pain Hematologic:  [] Easy bruising  [] Easy bleeding   [] Hypercoagulable state   [x] Anemic   Gastrointestinal:  [] Blood in stool   [] Vomiting blood  [x] Gastroesophageal reflux/heartburn   [] Abdominal pain Genitourinary:  [x] Chronic kidney disease   [] Difficult urination  [] Frequent urination  [] Burning with urination   [] Hematuria Skin:  [] Rashes   [] Ulcers   [] Wounds Psychological:  [] History of anxiety   []  History of major depression.    Physical Examination  Vitals:   09/17/20 1440  BP: 131/76  Pulse: 70  Resp: 16  Weight: 193 lb (87.5 kg)  Height: 6' (1.829 m)   Body mass index is 26.18 kg/m. Gen:  WD/WN, NAD Head: [place]/AT, No temporalis wasting. Ear/Nose/Throat: Hearing grossly intact, nares w/o erythema or drainage, trachea midline Eyes: Conjunctiva clear. Sclera non-icteric Neck: Supple.  Trachea is  midline Pulmonary:  Good air movement, equal and clear to auscultation bilaterally.  Cardiac: RRR, No JVD Vascular:  Vessel Right Left  Radial Palpable Palpable  DP Palpable Palpable   Musculoskeletal: M/S 5/5 throughout.  No deformity or atrophy.  No appreciable lower extremity edema. Neurologic: CN 2-12 intact. Sensation grossly intact in extremities.  Symmetrical.  Speech is fluent. Motor exam as listed above. Psychiatric: Judgment intact, Mood & affect appropriate for pt's clinical situation. Dermatologic: No rashes or ulcers noted.  No cellulitis or open wounds.     CBC Lab Results  Component Value Date   WBC 5.6 08/28/2020   HGB 13.2 08/28/2020   HCT 39.1 08/28/2020   MCV 100.3 (H) 08/28/2020   PLT 100 (L) 08/28/2020    BMET    Component Value Date/Time   NA 142 08/20/2020 0925   NA 139 04/23/2014 0841   K 4.3 08/20/2020 0925   K 4.3 04/23/2014 0841   CL 109 08/20/2020 0925   CL 106 04/23/2014 0841   CO2 23 08/20/2020 0925   CO2 24 04/23/2014 0841   GLUCOSE 143 (H) 08/20/2020 0925   GLUCOSE 138 (H) 04/23/2014 0841   BUN 15 08/20/2020 0925   BUN 30 (H) 04/23/2014 0841   CREATININE 1.01 08/20/2020 0925   CREATININE 1.41 (H) 04/23/2014 0841   CALCIUM 8.8 (L) 08/20/2020 0925   CALCIUM 7.9 (L) 04/23/2014 0841   GFRNONAA >60 08/20/2020 0925   GFRNONAA 55 (L) 04/23/2014 0841   GFRNONAA >60 10/12/2013 0855   GFRAA >60 08/11/2019 1645   GFRAA >60 04/23/2014 0841   GFRAA >60 10/12/2013 0855   CrCl cannot be calculated (Patient's most recent lab result is older than the maximum 21 days allowed.).  COAG Lab Results  Component Value Date   INR 1.0 08/28/2020   INR 0.95 11/20/2015   INR 1.21 10/20/2015    Radiology DG Chest 1 View  Result Date: 08/28/2020 CLINICAL DATA:  Preop cryoablation. EXAM: CHEST  1 VIEW COMPARISON:  CT chest 09/21/2019.  Chest x-ray 10/25/2015. FINDINGS: Mediastinum and hilar structures normal. Lungs are clear. No pleural effusion or  pneumothorax. Heart size normal. Degenerative change thoracic spine. IMPRESSION: No  acute cardiopulmonary disease. Electronically Signed   By: Marcello Moores  Register   On: 08/28/2020 07:38   CT GUIDE TISSUE ABLATION  Result Date: 08/28/2020 INDICATION: New 2 cm solid enhancing right renal mass compatible with renal neoplasm by imaging. EXAM: CT-GUIDED PERCUTANEOUS CRYOABLATION OF 2 CM LATERAL RIGHT RENAL MASS ANESTHESIA/SEDATION: General MEDICATIONS: ANCEF 2 G. The antibiotic was administered in an appropriate time interval prior to needle puncture of the skin. CONTRAST:  54mL OMNIPAQUE IOHEXOL 350 MG/ML SOLN PROCEDURE: The procedure, risks, benefits, and alternatives were explained to the patient. Questions regarding the procedure were encouraged and answered. The patient understands and consents to the procedure. The patient was placed under general anesthesia. Initial unenhanced CT was performed in a RIGHT ANTERIOR OBLIQUE position to localize the 2 cm right mid pole solid enhancing exophytic renal mass. Overlying skin marked. The patient was prepped with ChloraPrep in a sterile fashion, and a sterile drape was applied covering the operative field. A sterile gown and sterile gloves were used for the procedure. Under CT guidance, 2 ice pearl 2.1 CX 14 gauge percutaneous cryoablation probes were advanced into the 2 cm right mid pole renal mass. Probe positioning was confirmed by CT prior to cryoablation. Needles were placed in a craniocaudal fashion separated by 1 cm. Cryoablation was performed through the 2 ice pearl 2.1 CX probes. Initial 10 minute cycle of cryoablation was performed. This was followed by a 8 minute thaw cycle. A second 10 minute cycle of cryoablation was then performed. During ablation, periodic CT imaging was performed to monitor ice ball formation and morphology. After active thaw and cautery, the cryoablation probes were removed. Post-procedural CT was performed. COMPLICATIONS: None immediate.  FINDINGS: Imaging confirms 2 needle placement within the 2 cm right mid pole exophytic renal mass. Needles were placed in a craniocaudal fashion separated by 1 cm. CT monitoring during the exam confirms adequate ice coverage of the lesion. 50 cc contrast was administered again confirming adequate ice coverage of the lesion during treatment phases. Following 2 cycles of treatment, active thaw, and cautery postprocedure imaging demonstrates no surrounding hemorrhage or hematoma. No other complication. IMPRESSION: CT guided percutaneous cryoablation of the 2 cm right mid pole renal mass. The patient will be observed overnight. Initial follow-up will be performed in approximately 4 weeks. Electronically Signed   By: Jerilynn Mages.  Shick M.D.   On: 08/28/2020 11:32   IR Radiologist Eval & Mgmt  Result Date: 09/17/2020 Please refer to notes tab for details about interventional procedure. (Op Note)    Assessment/Plan Carotid stenosis Carotid duplex reveals stable, 1 to 39% ICA stenosis bilaterally.  Doing well with no current symptoms.  On appropriate medical therapy with aspirin, statin, and Plavix.  Recheck annually.  Benign essential HTN blood pressure control important in reducing the progression of atherosclerotic disease. On appropriate oral medications.   Atherosclerosis of native arteries of extremity with intermittent claudication (HCC) ABIs today are 1.2 bilaterally with multiphasic waveforms and normal digital pressures bilaterally.  No current worrisome symptoms.  On appropriate medical therapy.  Recheck in 1 year.  Swelling of limb The patient swelling is reasonably well controlled with compression socks, elevation, and the brace on his left ankle is also made a significant difference as well.  This is stabilized his ankle.  Doing well from this.  Has a lymphedema pump if swelling worsens.  We will be following him for his other vascular issues but he will contact our office with any significant  worsening in the swelling.  Leotis Pain, MD  09/17/2020 3:22 PM    This note was created with Dragon medical transcription system.  Any errors from dictation are purely unintentional

## 2020-09-17 NOTE — Assessment & Plan Note (Signed)
The patient swelling is reasonably well controlled with compression socks, elevation, and the brace on his left ankle is also made a significant difference as well.  This is stabilized his ankle.  Doing well from this.  Has a lymphedema pump if swelling worsens.  We will be following him for his other vascular issues but he will contact our office with any significant worsening in the swelling.

## 2020-09-18 ENCOUNTER — Telehealth: Payer: Self-pay

## 2020-09-18 NOTE — Telephone Encounter (Signed)
Left message for patient to notify them that it is time to schedule annual low dose lung cancer screening CT scan. Instructed patient to call back ([phone]) to verify information and schedule.  

## 2020-10-09 ENCOUNTER — Other Ambulatory Visit: Payer: Self-pay

## 2020-10-09 ENCOUNTER — Encounter: Payer: Self-pay | Admitting: Podiatry

## 2020-10-09 ENCOUNTER — Ambulatory Visit (INDEPENDENT_AMBULATORY_CARE_PROVIDER_SITE_OTHER): Payer: Managed Care, Other (non HMO) | Admitting: Podiatry

## 2020-10-09 DIAGNOSIS — D689 Coagulation defect, unspecified: Secondary | ICD-10-CM | POA: Diagnosis not present

## 2020-10-09 DIAGNOSIS — M79676 Pain in unspecified toe(s): Secondary | ICD-10-CM

## 2020-10-09 DIAGNOSIS — B351 Tinea unguium: Secondary | ICD-10-CM | POA: Diagnosis not present

## 2020-10-09 DIAGNOSIS — M14671 Charcot's joint, right ankle and foot: Secondary | ICD-10-CM

## 2020-10-09 NOTE — Progress Notes (Signed)
He presents today for follow-up of his Charcot deformity and arthritis of his left foot.  He is also complaining of painful elongated toenails.  Objective: Vital signs are stable he is alert and oriented x3.  Pulses are palpable.  Still has deformity but no open lesions or wounds that are noted.  He has continuing to wear his brace on a regular basis.  Assessment: Pain in limb secondary to onychomycosis Charcot deformity left foot.  Plan: Debridement of toenails 1 through 5 bilateral continue use his [place] brace follow-up with me in 3 to 4 months.

## 2020-10-14 ENCOUNTER — Encounter (INDEPENDENT_AMBULATORY_CARE_PROVIDER_SITE_OTHER): Payer: Self-pay

## 2020-10-30 ENCOUNTER — Telehealth: Payer: Self-pay

## 2020-10-30 ENCOUNTER — Other Ambulatory Visit: Payer: Self-pay

## 2020-10-30 MED ORDER — CLENPIQ 10-3.5-12 MG-GM -GM/160ML PO SOLN: 320.0000 mL | ORAL | 0 refills | Status: DC

## 2020-10-30 NOTE — Telephone Encounter (Signed)
Left vm informing pt of Dr. Bunnie Domino recommendation for Plavix. Will also send pt a Mychart message

## 2020-10-30 NOTE — Telephone Encounter (Signed)
-----   Message from Algernon Huxley, MD sent at 10/30/2020  2:09 PM EDT ----- Can stop plavix 5 days prior to procedure and resume the day after the procedure  ----- Message ----- From: Glennie [place], CMA Sent: 10/30/2020  12:05 PM EDT To: Algernon Huxley, MD

## 2020-11-14 ENCOUNTER — Encounter: Payer: Self-pay | Admitting: Surgery

## 2020-11-25 ENCOUNTER — Telehealth: Payer: Self-pay | Admitting: Gastroenterology

## 2020-11-25 NOTE — Telephone Encounter (Signed)
Patient wants to reschedule procedure. Clinical staff will follow up with patient.

## 2020-11-25 NOTE — Telephone Encounter (Signed)
LVM for pt to return my call to reschedule Colonoscopy.

## 2020-11-25 NOTE — Telephone Encounter (Signed)
Pt returned my call and has been rescheduled to Oct 20th.

## 2020-12-02 ENCOUNTER — Other Ambulatory Visit: Payer: Self-pay | Admitting: Interventional Radiology

## 2020-12-02 DIAGNOSIS — N2889 Other specified disorders of kidney and ureter: Secondary | ICD-10-CM

## 2020-12-31 ENCOUNTER — Ambulatory Visit (INDEPENDENT_AMBULATORY_CARE_PROVIDER_SITE_OTHER): Payer: Managed Care, Other (non HMO) | Admitting: Surgery

## 2020-12-31 ENCOUNTER — Encounter: Payer: Self-pay | Admitting: Surgery

## 2020-12-31 ENCOUNTER — Other Ambulatory Visit: Payer: Self-pay

## 2020-12-31 VITALS — BP 129/87 | HR 67 | Temp 98.4°F | Ht 72.0 in | Wt 190.0 lb

## 2020-12-31 DIAGNOSIS — K432 Incisional hernia without obstruction or gangrene: Secondary | ICD-10-CM | POA: Insufficient documentation

## 2020-12-31 DIAGNOSIS — M6208 Separation of muscle (nontraumatic), other site: Secondary | ICD-10-CM | POA: Diagnosis not present

## 2020-12-31 NOTE — Progress Notes (Signed)
Patient ID: [name] [name], male   DOB: [birth year], 60 y.o.   MRN: [id number]  Chief Complaint: Hernia recurrence  History of Present Illness Since our last visit this gentleman has undergone cryoablation of a renal cell carcinoma.  He is doing very well and has follow-up CT imaging scheduled for middle of next month.  He also has a planned colonoscopy which was deferred to be done next month and evaluation for treatment of a asymptomatic kidney stone.  He currently reports his previously known incisional hernia remains just a bulge, but denies any significant pain or exacerbating elements. [name] is a 60 y.o. male with history of laparoscopic converted open with primary repair of umbilical defect following repair iatrogenic colonic injury.  He had a large diastases recti associated with his hernia prior to the repair attempt last May 2021. He does not complain of any pain now, still has a large diastases recti.  Does not know any particular types of alleviating or aggravating issues.  And has no particular concerns about the area of the previous hernia with exception of its softer character.  It seems he may be just be very proactive in terms of preventing any potential recurrence which he knows he is at risk for.  Past Medical History Past Medical History:  Diagnosis Date   Anemia    pernicious anemia   Chest pain    Unspecified   Chronic gouty arthritis 12/26/2015   Chronic kidney disease    Dysrhythmia    pac's.  okay now.   GERD (gastroesophageal reflux disease)    History of kidney stones    Hypertension    Hypertriglyceridemia    Kidney stone on left side    2018   Palpitations    Peripheral vascular disease ([place]) 2019   Pre-diabetes    Renal insufficiency    Renal mass    Thyroid nodule 2019   being monitored      Past Surgical History:  Procedure Laterality Date   CYSTOSCOPY W/ RETROGRADES Bilateral 03/23/2017   Procedure: CYSTOSCOPY WITH RETROGRADE  PYELOGRAM;  Surgeon: Abbie Sons, MD;  Location: [facility];  Service: Urology;  Laterality: Bilateral;   CYSTOSCOPY W/ URETERAL STENT PLACEMENT Left 03/31/2017   Procedure: CYSTOSCOPY WITH STENT REPLACEMENT;  Surgeon: Abbie Sons, MD;  Location: [facility];  Service: Urology;  Laterality: Left;   CYSTOSCOPY/RETROGRADE/URETEROSCOPY/STONE EXTRACTION WITH BASKET Left 03/31/2017   Procedure: CYSTOSCOPY/RETROGRADE/URETEROSCOPY/STONE EXTRACTION WITH BASKET;  Surgeon: Abbie Sons, MD;  Location: [facility];  Service: Urology;  Laterality: Left;   CYSTOSCOPY/URETEROSCOPY/HOLMIUM LASER/STENT PLACEMENT Left 03/23/2017   Procedure: CYSTOSCOPY/URETEROSCOPY/HOLMIUM LASER/STENT PLACEMENT;  Surgeon: Abbie Sons, MD;  Location: [facility];  Service: Urology;  Laterality: Left;   ESOPHAGOGASTRODUODENOSCOPY (EGD) WITH PROPOFOL N/A 10/22/2015   Procedure: ESOPHAGOGASTRODUODENOSCOPY (EGD) WITH PROPOFOL;  Surgeon: Lucilla Lame, MD;  Location: [facility];  Service: Endoscopy;  Laterality: N/A;   EXTRACORPOREAL SHOCK WAVE LITHOTRIPSY  2016   EXTRACORPOREAL SHOCK WAVE LITHOTRIPSY Left 09/13/2014   Procedure: EXTRACORPOREAL SHOCK WAVE LITHOTRIPSY (ESWL);  Surgeon: Hollice Espy, MD;  Location: [facility];  Service: Urology;  Laterality: Left;   HERNIA REPAIR     IR RADIOLOGIST EVAL & MGMT  08/01/2020   IR RADIOLOGIST EVAL & MGMT  09/17/2020   LOWER EXTREMITY ANGIOGRAPHY Left 07/15/2017   Procedure: LOWER EXTREMITY ANGIOGRAPHY;  Surgeon: Algernon Huxley, MD;  Location: [place] [facility];  Service: Cardiovascular;  Laterality: Left;   lower extremity interventions x2 right leg Right  stent   PERIPHERAL VASCULAR CATHETERIZATION Right 08/20/2014   Procedure: Lower Extremity Angiography;  Surgeon: Algernon Huxley, MD;  Location: [place] [facility];  Service: Cardiovascular;  Laterality: Right;   PERIPHERAL VASCULAR CATHETERIZATION Right 08/20/2014   Procedure: Lower Extremity Intervention;  Surgeon: Algernon Huxley, MD;   Location: [place] [facility];  Service: Cardiovascular;  Laterality: Right;   RADIOLOGY WITH ANESTHESIA N/A 08/28/2020   Procedure: RADIOLOGY WITH ANESTHESIA CT WITH CRYOABLATION;  Surgeon: Greggory Keen, MD;  Location: [facility];  Service: Anesthesiology;  Laterality: N/A;   VENTRAL HERNIA REPAIR  08/11/2019   Procedure: HERNIA REPAIR VENTRAL ADULT WITH REPAIR OF LARGE INTESTINE LACERATION;  Surgeon: Herbert Pun, MD;  Location: [facility];  Service: General;;   XI ROBOTIC ASSISTED VENTRAL HERNIA N/A 08/11/2019   Procedure: XI ROBOTIC ASSISTED VENTRAL HERNIA;  Surgeon: Herbert Pun, MD;  Location: [facility];  Service: General;  Laterality: N/A;  Converted to open procedure    No Known Allergies  Current Outpatient Medications  Medication Sig Dispense Refill   amLODipine (NORVASC) 10 MG tablet Take 10 mg by mouth daily.     aspirin EC 81 MG tablet Take 81 mg by mouth daily.     clopidogrel (PLAVIX) 75 MG tablet Take 75 mg by mouth daily.     metoprolol (TOPROL-XL) 200 MG 24 hr tablet Take 200 mg by mouth daily.      Multiple Vitamin (MULTIVITAMIN WITH MINERALS) TABS tablet Take 1 tablet by mouth daily.     omega-3 acid ethyl esters (LOVAZA) 1 G capsule Take 4 g by mouth daily.      pantoprazole (PROTONIX) 40 MG tablet Take 40 mg by mouth daily.     rosuvastatin (CRESTOR) 40 MG tablet Take 40 mg by mouth daily.      No current facility-administered medications for this visit.    Family History Family History  Problem Relation Age of Onset   Heart failure Other       Social History Social History   Tobacco Use   Smoking status: Former    Packs/day: 1.00    Years: 37.00    Pack years: 37.00    Types: Cigarettes    Quit date: 04/21/2013    Years since quitting: 7.7   Smokeless tobacco: Never  Vaping Use   Vaping Use: Never used  Substance Use Topics   Alcohol use: Yes    Alcohol/week: 14.0 standard drinks    Types: 7 Glasses of wine, 7 Cans of beer per week     Comment: trying to cut back on daily consumption   Drug use: No        Review of Systems  Constitutional: Negative.   HENT: Negative.    Eyes: Negative.   Respiratory: Negative.    Cardiovascular: Negative.   Gastrointestinal: Negative.   Genitourinary: Negative.   Skin: Negative.   Neurological: Negative.   Psychiatric/Behavioral: Negative.       Physical Exam Blood pressure 129/87, pulse 67, temperature 98.4 F (36.9 C), height 6' (1.829 m), weight 190 lb (86.2 kg), SpO2 97 %. Last Weight  Most recent update: 12/31/2020  1:54 PM    Weight  86.2 kg (190 lb)             CONSTITUTIONAL: Well developed, and nourished, appropriately responsive and aware without distress.   EYES: Sclera non-icteric.   EARS, NOSE, MOUTH AND THROAT: Mask worn.    Hearing is intact to voice.  NECK: Trachea is midline,  and there is no jugular venous distension.  LYMPH NODES:  Lymph nodes in the neck are not enlarged. RESPIRATORY:  Lungs are clear, and breath sounds are equal bilaterally. Normal respiratory effort without pathologic use of accessory muscles. CARDIOVASCULAR: Heart is regular in rate and rhythm. GI: The abdomen is scarred with an umbilical to xiphoid scar, wide diastases recti readily apparent, without any appreciable fascial defects or the evidence of any irregular masses within the diastases.  Otherwise soft, nontender, and nondistended.  There is a prominence near the umbilical area, likely consistent with the known fascial defect on CT.  I did not appreciate hepatosplenomegaly. There were normal bowel sounds. MUSCULOSKELETAL:  Symmetrical muscle tone appreciated in all four extremities.  Wearing diabetic shoes/brace. SKIN: Skin turgor is normal. No pathologic skin lesions appreciated.  NEUROLOGIC:  Motor appears grossly normal.  Cranial nerves are grossly without defect. PSYCH:  Alert and oriented to person, place and time. Affect is appropriate for situation.  Data Reviewed I  have personally reviewed what is currently available of the patient's imaging, recent labs and medical records.   Labs:  CBC Latest Ref Rng & Units 08/28/2020 08/20/2020 08/11/2019  WBC 4.0 - 10.5 K/uL 5.6 6.8 14.0(H)  Hemoglobin 13.0 - 17.0 g/dL 13.2 14.4 13.0  Hematocrit 39.0 - 52.0 % 39.1 43.1 38.1(L)  Platelets 150 - 400 K/uL 100(L) 117(L) 120(L)   CMP Latest Ref Rng & Units 08/20/2020 07/12/2020 08/11/2019  Glucose 70 - 99 mg/dL 143(H) - -  BUN 8 - 23 mg/dL 15 - -  Creatinine 0.61 - 1.24 mg/dL 1.01 1.00 1.11  Sodium 135 - 145 mmol/L 142 - -  Potassium 3.5 - 5.1 mmol/L 4.3 - -  Chloride 98 - 111 mmol/L 109 - -  CO2 22 - 32 mmol/L 23 - -  Calcium 8.9 - 10.3 mg/dL 8.8(L) - -  Total Protein 6.5 - 8.1 g/dL - - -  Total Bilirubin 0.3 - 1.2 mg/dL - - -  Alkaline Phos 38 - 126 U/L - - -  AST 15 - 41 U/L - - -  ALT 17 - 63 U/L - - -     Imaging: CLINICAL DATA:  Incisional hernia, history of ventral hernia repair   EXAM: CT ABDOMEN AND PELVIS WITH CONTRAST   TECHNIQUE: Multidetector CT imaging of the abdomen and pelvis was performed using the standard protocol following bolus administration of intravenous contrast.   CONTRAST:  110mL OMNIPAQUE IOHEXOL 300 MG/ML SOLN, additional oral enteric contrast   COMPARISON:  02/06/2017   FINDINGS: Lower chest: No acute abnormality.   Hepatobiliary: No solid liver abnormality is seen. No gallstones, gallbladder wall thickening, or biliary dilatation.   Pancreas: Unremarkable. No pancreatic ductal dilatation or surrounding inflammatory changes.   Spleen: Normal in size without significant abnormality.   Adrenals/Urinary Tract: Adrenal glands are unremarkable. Small nonobstructive calculi of the left kidney. No right-sided calculi. No hydronephrosis. There is a an exophytic mass arising from the lateral midportion of the right kidney measuring 1.6 x 1.4 cm (series 2, image 31). Although this is adjacent to a cortical cyst, attenuation is  similar to renal cortex and too high to be accounted for by hemorrhagic or proteinaceous contents. Bladder is unremarkable.   Stomach/Bowel: Stomach is within normal limits. Appendix appears normal. No evidence of bowel wall thickening, distention, or inflammatory changes. Sigmoid diverticulosis.   Vascular/Lymphatic: Aortic atherosclerosis. No enlarged abdominal or pelvic lymph nodes.   Reproductive: No mass or other significant abnormality.   Other:  Status post low midline ventral hernia repair. At the inferior aspect of the hernia repair, there is a minimal recurrent herniation containing a partial, nonobstructed loop of mid small bowel, this component measuring no greater than 2.3 cm in width and 1.1 cm in depth (series 2, image 50). Small, fat containing right inguinal hernia. No abdominopelvic ascites.   Musculoskeletal: No acute or significant osseous findings.   IMPRESSION: 1. Status post low midline ventral hernia repair. At the inferior aspect of the hernia repair, there is a minimal recurrent herniation containing a partial, nonobstructed loop of mid small bowel, this component measuring no greater than 2.3 cm in width and 1.1 cm in depth. 2. Small, fat containing right inguinal hernia. 3. There is a 1.6 x 1.4 cm exophytic mass arising from the lateral midportion of the right kidney measuring 1.6 x 1.4 cm, which appears to contrast enhance on this single phase examination and is consistent with a small renal cell carcinoma. 4. Nonobstructive left nephrolithiasis.   These results will be called to the ordering clinician or representative by the Radiologist Assistant, and communication documented in the PACS or [employer].   Aortic Atherosclerosis (ICD10-I70.0).     Electronically Signed   By: Eddie Candle M.D.   On: 07/14/2020 17:53    Assessment    History of ventral hernia repair, primary repair without mesh reinforcement. Known/longstanding wide  epigastric rectus diastases.  At least 2 areas of midline defects noted on prior CT scanning from last April. Patient Active Problem List   Diagnosis Date Noted   Renal cell cancer, right ([place]) 08/28/2020   Diastasis recti 07/02/2020   Ventral hernia 08/11/2019   Coronary artery disease involving native coronary artery 06/20/2018   Emphysema lung ([place]) 06/20/2018   Hyperglycemia 05/06/2017   Uric acid nephrolithiasis 04/28/2017   Swelling of limb 04/20/2017   ARF (acute renal failure) ([place]) 02/06/2017   Carotid stenosis 08/21/2016   Atherosclerosis of native arteries of extremity with intermittent claudication ([place]) 08/21/2016   Ataxia 04/30/2016   Diplopia 04/30/2016   Chronic gouty arthritis 12/26/2015   Esophagitis, unspecified    Portal hypertension ([place])    Absolute anemia    Other acute pancreatitis    Acute blood loss anemia    AKI (acute kidney injury) ([place]) 10/19/2015   Health care maintenance 05/23/2015   Pernicious anemia 05/23/2015   Multinodular goiter 01/10/2015   Elevated PSA 08/29/2014   GERD (gastroesophageal reflux disease) 08/29/2014   Gross hematuria 08/29/2014   History of tobacco use 01/13/2014   Benign essential HTN 09/03/2013   Chronic venous insufficiency 09/03/2013   PVD (peripheral vascular disease) ([place]) 09/03/2013   Palpitations 07/14/2011   HYPERTRIGLYCERIDEMIA 09/13/2008   CHEST PAIN-UNSPECIFIED 09/13/2008    Plan    I encouraged him to defer this repair until after he completes his colonoscopy and follow-up for his kidney stones and his renal cell carcinoma.  We will pencil him in for surgery February 12, 2021.  Additional CT scan may reveal additional defects in the midline and I am sure that we would like to appreciate the full extent of these before proceeding with surgery. Anticipate contacting him in the third week of October prior to my international absence.  Face-to-face time spent with the patient and accompanying care providers(if  present) was 30 minutes, with more than 50% of the time spent counseling, educating, and coordinating care of the patient.      Ronny Bacon M.D., FACS 12/31/2020, 2:11 PM

## 2020-12-31 NOTE — Patient Instructions (Signed)
Please call the office for any questions or concerns   Call your office after you have your colonoscopy so Dr Christian Mate can review the results.   Our surgery scheduler will give you a call to schedule surgery (02/12/21)  have your blue sheet with you to write down information.

## 2021-01-01 ENCOUNTER — Telehealth: Payer: Self-pay | Admitting: Surgery

## 2021-01-01 NOTE — Telephone Encounter (Signed)
Outgoing call is made, left message for patient to call.  Please inform patient of Pre-Admission date/time, COVID Testing date and Surgery date.  Surgery Date: 02/12/21 Preadmission Testing Date: 02/04/21 (phone 8a-1p) Covid Testing Date: 02/10/21 @ 8:05 am  - patient advised to go to the [facility] ([place])   Also patient will need to call at [phone], between 1-3:00pm the day before surgery, to find out what time to arrive for surgery.    Also since surgery is November, patient will need another follow up to discuss surgery prior to scheduled surgery on 02/12/21.

## 2021-01-01 NOTE — Telephone Encounter (Signed)
Received return call from patient.  He is aware of all dates regarding his surgery and information is given.   Patient is also reminded per Dr. Christian Mate last dose of Plavix to be on 02/06/21 until after his surgery.   Patient states will also call back to schedule follow up prior to surgery after he has his colonoscopy.

## 2021-01-02 ENCOUNTER — Ambulatory Visit: Payer: Managed Care, Other (non HMO) | Admitting: Surgery

## 2021-01-13 ENCOUNTER — Telehealth: Payer: Self-pay | Admitting: Surgery

## 2021-01-13 NOTE — Telephone Encounter (Signed)
Patient is scheduled for surgery with Dr. Christian Mate on 02/12/21.  Dr. Christian Mate wants to see patient in office for follow up prior to surgery.  Please schedule 1st week of November.

## 2021-01-17 ENCOUNTER — Ambulatory Visit
Admission: RE | Admit: 2021-01-17 | Discharge: 2021-01-17 | Disposition: A | Discharge status: Home / Self Care | Payer: Managed Care, Other (non HMO) | Source: Ambulatory Visit | Attending: Interventional Radiology | Admitting: Interventional Radiology

## 2021-01-17 ENCOUNTER — Other Ambulatory Visit: Payer: Self-pay

## 2021-01-17 DIAGNOSIS — N2889 Other specified disorders of kidney and ureter: Secondary | ICD-10-CM | POA: Insufficient documentation

## 2021-01-17 LAB — POCT I-STAT CREATININE: Creatinine, Ser: 0.9 mg/dL (ref 0.61–1.24)

## 2021-01-17 MED ORDER — IOHEXOL 350 MG/ML SOLN
100.0000 mL | Freq: Once | INTRAVENOUS | Status: AC | PRN
  Administered 2021-01-17: 100 mL via INTRAVENOUS

## 2021-01-21 ENCOUNTER — Encounter: Payer: Self-pay | Admitting: *Deleted

## 2021-01-21 ENCOUNTER — Ambulatory Visit
Admission: RE | Admit: 2021-01-21 | Discharge: 2021-01-21 | Disposition: A | Discharge status: Home / Self Care | Payer: Managed Care, Other (non HMO) | Source: Ambulatory Visit | Attending: Interventional Radiology | Admitting: Interventional Radiology

## 2021-01-21 ENCOUNTER — Other Ambulatory Visit: Payer: Self-pay

## 2021-01-21 DIAGNOSIS — N2889 Other specified disorders of kidney and ureter: Secondary | ICD-10-CM

## 2021-01-21 HISTORY — PX: IR RADIOLOGIST EVAL & MGMT: IMG5224

## 2021-01-21 NOTE — Progress Notes (Signed)
Patient ID: [name] [name], male   DOB: [birth year], 60 y.o.   MRN: [id number]       Chief Complaint:  Right renal mass status post cryoablation  Referring Physician(s): Dr. Erlene Quan  History of Present Illness: [name] [name] is a 60 y.o. male with multiple comorbidities including nephrolithiasis, hypertension and peripheral vascular disease.  He underwent successful CT-guided cryoablation of a new 1.6 cm lateral enhancing right renal mass compatible with renal cell carcinoma by imaging.  Procedure performed at [facility] 08/28/2020.  Overall he continues to do very well.  He is back to work.  No physical limitations.  No other urinary tract symptoms.  Surveillance imaging performed at [facility].  This confirms an expected ablation defect.  No signs of residual or recurrent disease.  No other suspicious renal abnormality.  Past Medical History:  Diagnosis Date   Anemia    pernicious anemia   Chest pain    Unspecified   Chronic gouty arthritis 12/26/2015   Chronic kidney disease    Dysrhythmia    pac's.  okay now.   GERD (gastroesophageal reflux disease)    History of kidney stones    Hypertension    Hypertriglyceridemia    Kidney stone on left side    2018   Palpitations    Peripheral vascular disease ([place]) 2019   Pre-diabetes    Renal insufficiency    Renal mass    Thyroid nodule 2019   being monitored    Past Surgical History:  Procedure Laterality Date   CYSTOSCOPY W/ RETROGRADES Bilateral 03/23/2017   Procedure: CYSTOSCOPY WITH RETROGRADE PYELOGRAM;  Surgeon: Abbie Sons, MD;  Location: [facility] ORS;  Service: Urology;  Laterality: Bilateral;   CYSTOSCOPY W/ URETERAL STENT PLACEMENT Left 03/31/2017   Procedure: CYSTOSCOPY WITH STENT REPLACEMENT;  Surgeon: Abbie Sons, MD;  Location: [facility];  Service: Urology;  Laterality: Left;   CYSTOSCOPY/RETROGRADE/URETEROSCOPY/STONE EXTRACTION WITH BASKET Left 03/31/2017    Procedure: CYSTOSCOPY/RETROGRADE/URETEROSCOPY/STONE EXTRACTION WITH BASKET;  Surgeon: Abbie Sons, MD;  Location: [facility];  Service: Urology;  Laterality: Left;   CYSTOSCOPY/URETEROSCOPY/HOLMIUM LASER/STENT PLACEMENT Left 03/23/2017   Procedure: CYSTOSCOPY/URETEROSCOPY/HOLMIUM LASER/STENT PLACEMENT;  Surgeon: Abbie Sons, MD;  Location: [facility];  Service: Urology;  Laterality: Left;   ESOPHAGOGASTRODUODENOSCOPY (EGD) WITH PROPOFOL N/A 10/22/2015   Procedure: ESOPHAGOGASTRODUODENOSCOPY (EGD) WITH PROPOFOL;  Surgeon: Lucilla Lame, MD;  Location: [facility];  Service: Endoscopy;  Laterality: N/A;   EXTRACORPOREAL SHOCK WAVE LITHOTRIPSY  2016   EXTRACORPOREAL SHOCK WAVE LITHOTRIPSY Left 09/13/2014   Procedure: EXTRACORPOREAL SHOCK WAVE LITHOTRIPSY (ESWL);  Surgeon: Hollice Espy, MD;  Location: [facility];  Service: Urology;  Laterality: Left;   HERNIA REPAIR     IR RADIOLOGIST EVAL & MGMT  08/01/2020   IR RADIOLOGIST EVAL & MGMT  09/17/2020   LOWER EXTREMITY ANGIOGRAPHY Left 07/15/2017   Procedure: LOWER EXTREMITY ANGIOGRAPHY;  Surgeon: Algernon Huxley, MD;  Location: [place] [facility];  Service: Cardiovascular;  Laterality: Left;   lower extremity interventions x2 right leg Right    stent   PERIPHERAL VASCULAR CATHETERIZATION Right 08/20/2014   Procedure: Lower Extremity Angiography;  Surgeon: Algernon Huxley, MD;  Location: [place] [facility];  Service: Cardiovascular;  Laterality: Right;   PERIPHERAL VASCULAR CATHETERIZATION Right 08/20/2014   Procedure: Lower Extremity Intervention;  Surgeon: Algernon Huxley, MD;  Location: [place] [facility];  Service: Cardiovascular;  Laterality: Right;   RADIOLOGY WITH ANESTHESIA N/A 08/28/2020   Procedure: RADIOLOGY WITH  ANESTHESIA CT WITH CRYOABLATION;  Surgeon: Greggory Keen, MD;  Location: [facility];  Service: Anesthesiology;  Laterality: N/A;   VENTRAL HERNIA REPAIR  08/11/2019   Procedure: HERNIA REPAIR VENTRAL ADULT WITH REPAIR OF LARGE INTESTINE  LACERATION;  Surgeon: Herbert Pun, MD;  Location: [facility];  Service: General;;   XI ROBOTIC ASSISTED VENTRAL HERNIA N/A 08/11/2019   Procedure: XI ROBOTIC ASSISTED VENTRAL HERNIA;  Surgeon: Herbert Pun, MD;  Location: [facility];  Service: General;  Laterality: N/A;  Converted to open procedure    Allergies: Patient has no known allergies.  Medications: Prior to Admission medications   Medication Sig Start Date End Date Taking? Authorizing Provider  amLODipine (NORVASC) 10 MG tablet Take 10 mg by mouth daily. 05/06/17   [provider]  aspirin EC 81 MG tablet Take 81 mg by mouth daily.    [provider]  clopidogrel (PLAVIX) 75 MG tablet Take 75 mg by mouth daily.    [provider]  metoprolol (TOPROL-XL) 200 MG 24 hr tablet Take 200 mg by mouth daily.     [provider]  Multiple Vitamin (MULTIVITAMIN WITH MINERALS) TABS tablet Take 1 tablet by mouth daily.    [provider]  omega-3 acid ethyl esters (LOVAZA) 1 G capsule Take 4 g by mouth daily.     [provider]  pantoprazole (PROTONIX) 40 MG tablet Take 40 mg by mouth daily.    [provider]  rosuvastatin (CRESTOR) 40 MG tablet Take 40 mg by mouth daily.     [provider]     Family History  Problem Relation Age of Onset   Heart failure Other     Social History   Socioeconomic History   Marital status: Married    Spouse name: Not on file   Number of children: Not on file   Years of education: Not on file   Highest education level: Not on file  Occupational History   Occupation: Full Time  Tobacco Use   Smoking status: Former    Packs/day: 1.00    Years: 37.00    Pack years: 37.00    Types: Cigarettes    Quit date: 04/21/2013    Years since quitting: 7.7   Smokeless tobacco: Never  Vaping Use   Vaping Use: Never used  Substance and Sexual Activity   Alcohol use: Yes    Alcohol/week: 14.0 standard drinks    Types: 7  Glasses of wine, 7 Cans of beer per week    Comment: trying to cut back on daily consumption   Drug use: No   Sexual activity: Yes  Other Topics Concern   Not on file  Social History Narrative   Regular exercise: No   Social Determinants of Health   Financial Resource Strain: Not on file  Food Insecurity: Not on file  Transportation Needs: Not on file  Physical Activity: Not on file  Stress: Not on file  Social Connections: Not on file     Review of Systems  Review of Systems: A 12 point ROS discussed and pertinent positives are indicated in the HPI above.  All other systems are negative.  Physical Exam No direct physical exam was performed telephone health visit only today. Vital Signs: There were no vitals taken for this visit.  Imaging: CT ABDOMEN W WO CONTRAST  Result Date: 01/19/2021 CLINICAL DATA:  History of RCC, status post cryoablation on Aug 28, 2020, initial follow-up examination. EXAM: CT ABDOMEN WITHOUT AND WITH CONTRAST  TECHNIQUE: Multidetector CT imaging of the abdomen was performed following the standard protocol before and following the bolus administration of intravenous contrast. CONTRAST:  158mL OMNIPAQUE IOHEXOL 350 MG/ML SOLN COMPARISON:  CT July 12, 2020 FINDINGS: Lower chest: No acute abnormality. Hepatobiliary: Enlargement of the lateral segment left lobe of the liver with widening of the fissures and slight contour nodularity suggestive of hepatic cirrhosis. Tiny focus of arterial enhancement along the anterior aspect of segmentIVa along the falciform ligament on image 34/8 which equilibrates to background liver on subsequent delayed imaging and likely reflects an intrahepatic shunt/altered area perfusion. Gallbladder is unremarkable. No biliary ductal dilation. Pancreas: No pancreatic ductal dilation or evidence of acute inflammation. Spleen: No splenomegaly. Adrenals/Urinary Tract: Similar thickening of the left adrenal gland, favor hyperplasia. Right  adrenal glands unremarkable. No hydronephrosis. Punctate nonobstructive left renal calculi. Bilateral lobular renal contour. Postprocedural changes of cryoablation of a right interpolar RCC with the cryo ablation zone measuring 2.5 x 2.0 cm on image 76/8. This is the first follow-up imaging post therapy. No suspicious residual enhancement. Exophytic nonenhancing hemorrhagic/proteinaceous left lower pole renal cyst. Stomach/Bowel: Small hiatal hernia otherwise the stomach is unremarkable for degree of distension. No pathologic dilation or evidence of inflammation involving loops of small or large bowel in the abdomen. Appendix is partially visualized and appears normal. Vascular/Lymphatic: The renal veins and IVC appear normal. No abdominal aortic aneurysm. Aortic and branch vessel atherosclerosis. No pathologically enlarged abdominal or pelvic lymph nodes. Similar prominent periportal lymph nodes are favored reactive. Other: Richter type fat and nonobstructed small bowel containing ventral hernia with a 2.6 cm aperture with. No abdominopelvic ascites. Prior ventral hernia repair. No discrete peritoneal nodularity. Musculoskeletal: Multilevel degenerative changes spine. No aggressive lytic or blastic lesion of bone. IMPRESSION: 1. Cryoablation changes of a right interpolar RCC with the treatment zone measuring up to 2.5 cm. No evidence of residual disease on this initial follow-up examination. 2. No evidence of abdominal metastases or tumor in vein. 3. CT imaging findings suggestive of hepatic cirrhosis. 4. Richter type fat and nonobstructed small bowel containing ventral hernia. 5. Punctate nonobstructive left nephrolithiasis. 6.  Aortic Atherosclerosis (ICD10-I70.0). Electronically Signed   By: Dahlia Bailiff M.D.   On: 01/19/2021 16:57    Labs:  CBC: Recent Labs    08/20/20 0925 08/28/20 0727  WBC 6.8 5.6  HGB 14.4 13.2  HCT 43.1 39.1  PLT 117* 100*    COAGS: Recent Labs    08/28/20 0727  INR  1.0    BMP: Recent Labs    07/12/20 1420 08/20/20 0925 01/17/21 1345  NA  --  142  --   K  --  4.3  --   CL  --  109  --   CO2  --  23  --   GLUCOSE  --  143*  --   BUN  --  15  --   CALCIUM  --  8.8*  --   CREATININE 1.00 1.01 0.90  GFRNONAA  --  >60  --     LIVER FUNCTION TESTS: No results for input(s): BILITOT, AST, ALT, ALKPHOS, PROT, ALBUMIN in the last 8760 hours.   Assessment and Plan:  Approximately 51-month status post right renal neoplasm cryoablation performed 08/28/2020 at [facility].  Surveillance imaging confirms expected ablation defect with no signs of residual or recurrent disease.  No suspicious residual marginal enhancement.  No delayed complication.  No other significant urinary tract finding.  Nonobstructing nephrolithiasis in the left kidney  lower pole measures 6 mm, unchanged.  Overall he is doing very well.  No urinary tract symptoms.  Plan: Continue surveillance imaging in 6 months with a repeat CT with contrast at [place]  Electronically Signed: Greggory Keen 01/21/2021, 9:43 AM   I spent a total of    25 Minutes in remote  clinical consultation, greater than 50% of which was counseling/coordinating care for this patient status post right renal cryoablation.    Visit type: Audio only (telephone). Audio (no video) only due to patient's lack of internet/smartphone capability. Alternative for in-person consultation at [facility], [address] [place], [place], [place]. This visit type was conducted due to national recommendations for restrictions regarding the COVID-19 Pandemic (e.g. social distancing).  This format is felt to be most appropriate for this patient at this time.  All issues noted in this document were discussed and addressed.

## 2021-01-23 ENCOUNTER — Ambulatory Visit: Payer: Managed Care, Other (non HMO) | Admitting: Registered Nurse

## 2021-01-23 ENCOUNTER — Encounter
Admission: RE | Disposition: A | Discharge status: Home / Self Care | Payer: Self-pay | Source: Home / Self Care | Attending: Gastroenterology

## 2021-01-23 ENCOUNTER — Ambulatory Visit
Admission: RE | Admit: 2021-01-23 | Discharge: 2021-01-23 | Disposition: A | Discharge status: Home / Self Care | Payer: Managed Care, Other (non HMO) | Attending: Gastroenterology | Admitting: Gastroenterology

## 2021-01-23 ENCOUNTER — Ambulatory Visit: Payer: Self-pay | Admitting: Urology

## 2021-01-23 ENCOUNTER — Encounter: Payer: Self-pay | Admitting: Gastroenterology

## 2021-01-23 DIAGNOSIS — Z79899 Other long term (current) drug therapy: Secondary | ICD-10-CM | POA: Diagnosis not present

## 2021-01-23 DIAGNOSIS — Z87891 Personal history of nicotine dependence: Secondary | ICD-10-CM | POA: Insufficient documentation

## 2021-01-23 DIAGNOSIS — D125 Benign neoplasm of sigmoid colon: Secondary | ICD-10-CM | POA: Diagnosis not present

## 2021-01-23 DIAGNOSIS — D120 Benign neoplasm of cecum: Secondary | ICD-10-CM | POA: Insufficient documentation

## 2021-01-23 DIAGNOSIS — K635 Polyp of colon: Secondary | ICD-10-CM

## 2021-01-23 DIAGNOSIS — Z1211 Encounter for screening for malignant neoplasm of colon: Secondary | ICD-10-CM

## 2021-01-23 DIAGNOSIS — Z7902 Long term (current) use of antithrombotics/antiplatelets: Secondary | ICD-10-CM | POA: Diagnosis not present

## 2021-01-23 DIAGNOSIS — K5730 Diverticulosis of large intestine without perforation or abscess without bleeding: Secondary | ICD-10-CM | POA: Insufficient documentation

## 2021-01-23 DIAGNOSIS — K641 Second degree hemorrhoids: Secondary | ICD-10-CM | POA: Insufficient documentation

## 2021-01-23 DIAGNOSIS — D124 Benign neoplasm of descending colon: Secondary | ICD-10-CM | POA: Diagnosis not present

## 2021-01-23 DIAGNOSIS — Z7982 Long term (current) use of aspirin: Secondary | ICD-10-CM | POA: Insufficient documentation

## 2021-01-23 HISTORY — PX: COLONOSCOPY: SHX5424

## 2021-01-23 SURGERY — COLONOSCOPY
Anesthesia: General

## 2021-01-23 MED ORDER — DEXMEDETOMIDINE HCL IN NACL 200 MCG/50ML IV SOLN
INTRAVENOUS | Status: AC
  Filled 2021-01-23: qty 50

## 2021-01-23 MED ORDER — PROPOFOL 500 MG/50ML IV EMUL
INTRAVENOUS | Status: DC | PRN
  Administered 2021-01-23: 150 ug/kg/min via INTRAVENOUS

## 2021-01-23 MED ORDER — PHENYLEPHRINE HCL-NACL 20-0.9 MG/250ML-% IV SOLN
INTRAVENOUS | Status: AC
  Filled 2021-01-23: qty 250

## 2021-01-23 MED ORDER — PROPOFOL 10 MG/ML IV BOLUS
INTRAVENOUS | Status: DC | PRN
  Administered 2021-01-23: 90 mg via INTRAVENOUS
  Administered 2021-01-23: 10 mg via INTRAVENOUS

## 2021-01-23 MED ORDER — LIDOCAINE HCL (CARDIAC) PF 100 MG/5ML IV SOSY
PREFILLED_SYRINGE | INTRAVENOUS | Status: DC | PRN
  Administered 2021-01-23: 100 mg via INTRAVENOUS

## 2021-01-23 MED ORDER — PROPOFOL 500 MG/50ML IV EMUL
INTRAVENOUS | Status: AC
  Filled 2021-01-23: qty 300

## 2021-01-23 MED ORDER — LIDOCAINE HCL (PF) 2 % IJ SOLN
INTRAMUSCULAR | Status: AC
  Filled 2021-01-23: qty 30

## 2021-01-23 MED ORDER — SODIUM CHLORIDE 0.9 % IV SOLN
INTRAVENOUS | Status: DC
  Administered 2021-01-23: 1000 mL via INTRAVENOUS

## 2021-01-23 MED ORDER — DEXMEDETOMIDINE HCL IN NACL 200 MCG/50ML IV SOLN
INTRAVENOUS | Status: DC | PRN
  Administered 2021-01-23: 20 ug via INTRAVENOUS

## 2021-01-23 NOTE — H&P (Signed)
Lucilla Lame, MD [address] [place],  [address] Phone: [phone] Fax : [phone]  Primary Care Physician:  Kirk Ruths, MD Primary Gastroenterologist:  Dr. Allen Norris  Pre-Procedure History & Physical: HPI:  [name] [name] is a 60 y.o. male is here for a screening colonoscopy.   Past Medical History:  Diagnosis Date   Anemia    pernicious anemia   Chest pain    Unspecified   Chronic gouty arthritis 12/26/2015   Chronic kidney disease    Dysrhythmia    pac's.  okay now.   GERD (gastroesophageal reflux disease)    History of kidney stones    Hypertension    Hypertriglyceridemia    Kidney stone on left side    2018   Palpitations    Peripheral vascular disease ([place]) 2019   Pre-diabetes    Renal insufficiency    Renal mass    Thyroid nodule 2019   being monitored    Past Surgical History:  Procedure Laterality Date   CYSTOSCOPY W/ RETROGRADES Bilateral 03/23/2017   Procedure: CYSTOSCOPY WITH RETROGRADE PYELOGRAM;  Surgeon: Abbie Sons, MD;  Location: [facility];  Service: Urology;  Laterality: Bilateral;   CYSTOSCOPY W/ URETERAL STENT PLACEMENT Left 03/31/2017   Procedure: CYSTOSCOPY WITH STENT REPLACEMENT;  Surgeon: Abbie Sons, MD;  Location: [facility];  Service: Urology;  Laterality: Left;   CYSTOSCOPY/RETROGRADE/URETEROSCOPY/STONE EXTRACTION WITH BASKET Left 03/31/2017   Procedure: CYSTOSCOPY/RETROGRADE/URETEROSCOPY/STONE EXTRACTION WITH BASKET;  Surgeon: Abbie Sons, MD;  Location: [facility];  Service: Urology;  Laterality: Left;   CYSTOSCOPY/URETEROSCOPY/HOLMIUM LASER/STENT PLACEMENT Left 03/23/2017   Procedure: CYSTOSCOPY/URETEROSCOPY/HOLMIUM LASER/STENT PLACEMENT;  Surgeon: Abbie Sons, MD;  Location: [facility];  Service: Urology;  Laterality: Left;   ESOPHAGOGASTRODUODENOSCOPY (EGD) WITH PROPOFOL N/A 10/22/2015   Procedure: ESOPHAGOGASTRODUODENOSCOPY (EGD) WITH PROPOFOL;  Surgeon: Lucilla Lame, MD;  Location:  [facility];  Service: Endoscopy;  Laterality: N/A;   EXTRACORPOREAL SHOCK WAVE LITHOTRIPSY  2016   EXTRACORPOREAL SHOCK WAVE LITHOTRIPSY Left 09/13/2014   Procedure: EXTRACORPOREAL SHOCK WAVE LITHOTRIPSY (ESWL);  Surgeon: Hollice Espy, MD;  Location: [facility];  Service: Urology;  Laterality: Left;   HERNIA REPAIR     IR RADIOLOGIST EVAL & MGMT  08/01/2020   IR RADIOLOGIST EVAL & MGMT  09/17/2020   IR RADIOLOGIST EVAL & MGMT  01/21/2021   LOWER EXTREMITY ANGIOGRAPHY Left 07/15/2017   Procedure: LOWER EXTREMITY ANGIOGRAPHY;  Surgeon: Algernon Huxley, MD;  Location: [place] [facility];  Service: Cardiovascular;  Laterality: Left;   lower extremity interventions x2 right leg Right    stent   PERIPHERAL VASCULAR CATHETERIZATION Right 08/20/2014   Procedure: Lower Extremity Angiography;  Surgeon: Algernon Huxley, MD;  Location: [place] [facility];  Service: Cardiovascular;  Laterality: Right;   PERIPHERAL VASCULAR CATHETERIZATION Right 08/20/2014   Procedure: Lower Extremity Intervention;  Surgeon: Algernon Huxley, MD;  Location: [place] [facility];  Service: Cardiovascular;  Laterality: Right;   RADIOLOGY WITH ANESTHESIA N/A 08/28/2020   Procedure: RADIOLOGY WITH ANESTHESIA CT WITH CRYOABLATION;  Surgeon: Greggory Keen, MD;  Location: [facility];  Service: Anesthesiology;  Laterality: N/A;   VENTRAL HERNIA REPAIR  08/11/2019   Procedure: HERNIA REPAIR VENTRAL ADULT WITH REPAIR OF LARGE INTESTINE LACERATION;  Surgeon: Herbert Pun, MD;  Location: [facility];  Service: General;;   XI ROBOTIC ASSISTED VENTRAL HERNIA N/A 08/11/2019   Procedure: XI ROBOTIC ASSISTED VENTRAL HERNIA;  Surgeon: Herbert Pun, MD;  Location: [facility];  Service: General;  Laterality: N/A;  Converted to open procedure    Prior to Admission medications   Medication Sig Start Date End Date Taking? Authorizing Provider  amLODipine (NORVASC) 10 MG tablet Take 10 mg by mouth daily. 05/06/17  Yes [provider]   metoprolol (TOPROL-XL) 200 MG 24 hr tablet Take 200 mg by mouth daily.    Yes [provider]  Multiple Vitamin (MULTIVITAMIN WITH MINERALS) TABS tablet Take 1 tablet by mouth daily.   Yes [provider]  omega-3 acid ethyl esters (LOVAZA) 1 G capsule Take 4 g by mouth daily.    Yes [provider]  pantoprazole (PROTONIX) 40 MG tablet Take 40 mg by mouth daily.   Yes [provider]  rosuvastatin (CRESTOR) 40 MG tablet Take 40 mg by mouth daily.    Yes [provider]  aspirin EC 81 MG tablet Take 81 mg by mouth daily.    [provider]  clopidogrel (PLAVIX) 75 MG tablet Take 75 mg by mouth daily.    [provider]    Allergies as of 10/30/2020   (No Known Allergies)    Family History  Problem Relation Age of Onset   Heart failure Other     Social History   Socioeconomic History   Marital status: Married    Spouse name: Not on file   Number of children: Not on file   Years of education: Not on file   Highest education level: Not on file  Occupational History   Occupation: Full Time  Tobacco Use   Smoking status: Former    Packs/day: 1.00    Years: 37.00    Pack years: 37.00    Types: Cigarettes    Quit date: 04/21/2013    Years since quitting: 7.7   Smokeless tobacco: Never  Vaping Use   Vaping Use: Never used  Substance and Sexual Activity   Alcohol use: Yes    Alcohol/week: 14.0 standard drinks    Types: 7 Glasses of wine, 7 Cans of beer per week    Comment: trying to cut back on daily consumption   Drug use: No   Sexual activity: Yes  Other Topics Concern   Not on file  Social History Narrative   Regular exercise: No   Social Determinants of Health   Financial Resource Strain: Not on file  Food Insecurity: Not on file  Transportation Needs: Not on file  Physical Activity: Not on file  Stress: Not on file  Social Connections: Not on file  Intimate Partner Violence: Not on file    Review  of Systems: See HPI, otherwise negative ROS  Physical Exam: BP 124/79   Pulse 86   Temp 97.6 F (36.4 C) (Temporal)   Resp 18   Ht 6' (1.829 m)   Wt 88.2 kg   SpO2 98%   BMI 26.37 kg/m  General:   Alert,  pleasant and cooperative in NAD Head:  Normocephalic and atraumatic. Neck:  Supple; no masses or thyromegaly. Lungs:  Clear throughout to auscultation.    Heart:  Regular rate and rhythm. Abdomen:  Soft, nontender and nondistended. Normal bowel sounds, without guarding, and without rebound.   Neurologic:  Alert and  oriented x4;  grossly normal neurologically.  Impression/Plan: [name] [name] is now here to undergo a screening colonoscopy.  Risks, benefits, and alternatives regarding colonoscopy have been reviewed with the patient.  Questions have been answered.  All parties agreeable.

## 2021-01-23 NOTE — Op Note (Signed)
[facility] Gastroenterology Patient Name: [name] [name] Procedure Date: 01/23/2021 7:32 AM MRN: [id number] Account #: [id number] Date of Birth: 03-06-1961 Admit Type: Outpatient Age: 60 Room: [facility] ENDO ROOM 4 Gender: Male Note Status: Finalized Instrument Name: Jasper Riling [id number] Procedure:             Colonoscopy Indications:           Screening for colorectal malignant neoplasm Providers:             Lucilla Lame MD, MD Referring MD:          Ocie Cornfield. Ouida Sills MD, MD (Referring MD) Medicines:             Propofol per Anesthesia Complications:         No immediate complications. Procedure:             Pre-Anesthesia Assessment:                        - Prior to the procedure, a History and Physical was                         performed, and patient medications and allergies were                         reviewed. The patient's tolerance of previous                         anesthesia was also reviewed. The risks and benefits                         of the procedure and the sedation options and risks                         were discussed with the patient. All questions were                         answered, and informed consent was obtained. Prior                         Anticoagulants: The patient has taken no previous                         anticoagulant or antiplatelet agents. ASA Grade                         Assessment: II - A patient with mild systemic disease.                         After reviewing the risks and benefits, the patient                         was deemed in satisfactory condition to undergo the                         procedure.                        After obtaining informed consent, the colonoscope was  passed under direct vision. Throughout the procedure,                         the patient's blood pressure, pulse, and oxygen                         saturations were monitored continuously. The                          Colonoscope was introduced through the anus and                         advanced to the the cecum, identified by appendiceal                         orifice and ileocecal valve. The colonoscopy was                         performed without difficulty. The patient tolerated                         the procedure well. The quality of the bowel                         preparation was excellent. Findings:      The perianal and digital rectal examinations were normal.      Two sessile polyps were found in the sigmoid colon. The polyps were 3 to       6 mm in size. These polyps were removed with a cold snare. Resection and       retrieval were complete.      A 4 mm polyp was found in the descending colon. The polyp was sessile.       The polyp was removed with a cold snare. Resection and retrieval were       complete.      A 2 mm polyp was found in the cecum. The polyp was sessile. The polyp       was removed with a cold biopsy forceps. Resection and retrieval were       complete.      Many small-mouthed diverticula were found in the sigmoid colon.      Non-bleeding internal hemorrhoids were found during retroflexion. The       hemorrhoids were Grade II (internal hemorrhoids that prolapse but reduce       spontaneously). Impression:            - Two 3 to 6 mm polyps in the sigmoid colon, removed                         with a cold snare. Resected and retrieved.                        - One 4 mm polyp in the descending colon, removed with                         a cold snare. Resected and retrieved.                        - One 2 mm polyp in the  cecum, removed with a cold                         biopsy forceps. Resected and retrieved.                        - Diverticulosis in the sigmoid colon.                        - Non-bleeding internal hemorrhoids. Recommendation:        - Discharge patient to home.                        - Resume previous diet.                        -  Continue present medications.                        - Await pathology results.                        - If the pathology report reveals adenomatous tissue,                         then repeat the colonoscopy for surveillance in 5                         years therewise 10 years. Procedure Code(s):     --- Professional ---                        [phone], Colonoscopy, flexible; with removal of                         tumor(s), polyp(s), or other lesion(s) by snare                         technique                        45380, 50, Colonoscopy, flexible; with biopsy, single                         or multiple Diagnosis Code(s):     --- Professional ---                        Z12.11, Encounter for screening for malignant neoplasm                         of colon                        K63.5, Polyp of colon CPT copyright 2019 [employer] Association. All rights reserved. The codes documented in this report are preliminary and upon coder review may  be revised to meet current compliance requirements. Lucilla Lame MD, MD 01/23/2021 7:49:14 AM This report has been signed electronically. Number of Addenda: 0 Note Initiated On: 01/23/2021 7:32 AM Scope Withdrawal Time: 0 hours 7 minutes 1 second  Total Procedure Duration: 0 hours 10 minutes 1 second  Estimated Blood Loss:  Estimated blood loss: none.  [facility]

## 2021-01-23 NOTE — Transfer of Care (Signed)
Immediate Anesthesia Transfer of Care Note  Patient: [name] [name]  Procedure(s) Performed: COLONOSCOPY  Patient Location: Endoscopy Unit  Anesthesia Type:General  Level of Consciousness: drowsy and patient cooperative  Airway & Oxygen Therapy: Patient Spontanous Breathing  Post-op Assessment: Report given to RN and Post -op Vital signs reviewed and stable  Post vital signs: Reviewed and stable  Last Vitals:  Vitals Value Taken Time  BP    Temp    Pulse 81 01/23/21 0749  Resp 24 01/23/21 0749  SpO2 98 % 01/23/21 0749  Vitals shown include unvalidated device data.  Last Pain:  Vitals:   01/23/21 0712  TempSrc: Temporal  PainSc: 0-No pain         Complications: No notable events documented.

## 2021-01-23 NOTE — Anesthesia Preprocedure Evaluation (Signed)
Anesthesia Evaluation  Patient identified by MRN, date of birth, ID band Patient awake    Reviewed: Allergy & Precautions, NPO status , Patient's Chart, lab work & pertinent test results  History of Anesthesia Complications Negative for: history of anesthetic complications  Airway Mallampati: III  TM Distance: <3 FB Neck ROM: full    Dental  (+) Chipped   Pulmonary shortness of breath and with exertion, COPD, former smoker,    Pulmonary exam normal        Cardiovascular Exercise Tolerance: Good hypertension, (-) angina+ CAD and + Peripheral Vascular Disease  Normal cardiovascular exam+ dysrhythmias      Neuro/Psych  Neuromuscular disease negative psych ROS   GI/Hepatic Neg liver ROS, GERD  Medicated and Controlled,  Endo/Other  negative endocrine ROS  Renal/GU Renal disease  negative genitourinary   Musculoskeletal  (+) Arthritis ,   Abdominal   Peds  Hematology negative hematology ROS (+)   Anesthesia Other Findings Past Medical History: No date: Anemia     Comment:  pernicious anemia No date: Chest pain     Comment:  Unspecified 12/26/2015: Chronic gouty arthritis No date: Chronic kidney disease No date: Dysrhythmia     Comment:  pac's.  okay now. No date: GERD (gastroesophageal reflux disease) No date: History of kidney stones No date: Hypertension No date: Hypertriglyceridemia No date: Kidney stone on left side     Comment:  2018 No date: Palpitations 2019: Peripheral vascular disease ([place]) No date: Pre-diabetes No date: Renal insufficiency No date: Renal mass 2019: Thyroid nodule     Comment:  being monitored  Past Surgical History: 03/23/2017: CYSTOSCOPY W/ RETROGRADES; Bilateral     Comment:  Procedure: CYSTOSCOPY WITH RETROGRADE PYELOGRAM;                Surgeon: Abbie Sons, MD;  Location: [facility];                Service: Urology;  Laterality: Bilateral; 03/31/2017: CYSTOSCOPY W/  URETERAL STENT PLACEMENT; Left     Comment:  Procedure: CYSTOSCOPY WITH STENT REPLACEMENT;  Surgeon:               Abbie Sons, MD;  Location: [facility];  Service:               Urology;  Laterality: Left; 03/31/2017: CYSTOSCOPY/RETROGRADE/URETEROSCOPY/STONE EXTRACTION WITH  BASKET; Left     Comment:  Procedure: CYSTOSCOPY/RETROGRADE/URETEROSCOPY/STONE               EXTRACTION WITH BASKET;  Surgeon: Abbie Sons, MD;                Location: [facility];  Service: Urology;  Laterality: Left; 03/23/2017: CYSTOSCOPY/URETEROSCOPY/HOLMIUM LASER/STENT PLACEMENT;  Left     Comment:  Procedure: CYSTOSCOPY/URETEROSCOPY/HOLMIUM LASER/STENT               PLACEMENT;  Surgeon: Abbie Sons, MD;  Location:               [facility];  Service: Urology;  Laterality: Left; 10/22/2015: ESOPHAGOGASTRODUODENOSCOPY (EGD) WITH PROPOFOL; N/A     Comment:  Procedure: ESOPHAGOGASTRODUODENOSCOPY (EGD) WITH               PROPOFOL;  Surgeon: Lucilla Lame, MD;  Location: [facility]               ENDOSCOPY;  Service: Endoscopy;  Laterality: N/A; 2016: EXTRACORPOREAL SHOCK WAVE LITHOTRIPSY 09/13/2014: EXTRACORPOREAL SHOCK WAVE LITHOTRIPSY; Left     Comment:  Procedure: EXTRACORPOREAL SHOCK WAVE LITHOTRIPSY (ESWL);              Surgeon: Hollice Espy, MD;  Location: [facility];                Service: Urology;  Laterality: Left; No date: HERNIA REPAIR 08/01/2020: IR RADIOLOGIST EVAL & MGMT 09/17/2020: IR RADIOLOGIST EVAL & MGMT 01/21/2021: IR RADIOLOGIST EVAL & MGMT 07/15/2017: LOWER EXTREMITY ANGIOGRAPHY; Left     Comment:  Procedure: LOWER EXTREMITY ANGIOGRAPHY;  Surgeon: Algernon Huxley, MD;  Location: [place] [facility];  Service:               Cardiovascular;  Laterality: Left; No date: lower extremity interventions x2 right leg; Right     Comment:  stent 08/20/2014: PERIPHERAL VASCULAR CATHETERIZATION; Right     Comment:  Procedure: Lower Extremity Angiography;  Surgeon: Algernon Huxley, MD;   Location: [place] [facility];  Service:               Cardiovascular;  Laterality: Right; 08/20/2014: PERIPHERAL VASCULAR CATHETERIZATION; Right     Comment:  Procedure: Lower Extremity Intervention;  Surgeon: Algernon Huxley, MD;  Location: [place] [facility];  Service:               Cardiovascular;  Laterality: Right; 08/28/2020: RADIOLOGY WITH ANESTHESIA; N/A     Comment:  Procedure: RADIOLOGY WITH ANESTHESIA CT WITH               CRYOABLATION;  Surgeon: Greggory Keen, MD;  Location: [facility];  Service: Anesthesiology;  Laterality: N/A; 08/11/2019: VENTRAL HERNIA REPAIR     Comment:  Procedure: HERNIA REPAIR VENTRAL ADULT WITH REPAIR OF               LARGE INTESTINE LACERATION;  Surgeon: Herbert Pun, MD;  Location: [facility];  Service: General;; 08/11/2019: XI ROBOTIC ASSISTED VENTRAL HERNIA; N/A     Comment:  Procedure: XI ROBOTIC ASSISTED VENTRAL HERNIA;  Surgeon:              Herbert Pun, MD;  Location: [facility];  Service:              General;  Laterality: N/A;  Converted to open procedure  BMI    Body Mass Index: 26.37 kg/m      Reproductive/Obstetrics negative OB ROS                             Anesthesia Physical Anesthesia Plan  ASA: 3  Anesthesia Plan: General   Post-op Pain Management:    Induction: Intravenous  PONV Risk Score and Plan: Propofol infusion and TIVA  Airway Management Planned: Natural Airway and Nasal Cannula  Additional Equipment:   Intra-op Plan:   Post-operative Plan:   Informed Consent: I have reviewed the patients History and Physical, chart, labs and discussed the procedure including the risks, benefits and alternatives for the proposed anesthesia with the patient or authorized representative who has indicated his/her understanding and acceptance.     Dental Advisory Given  Plan Discussed with: Anesthesiologist, CRNA and Surgeon  Anesthesia Plan  Comments: (Patient consented for risks of anesthesia including but not limited to:  - adverse reactions to medications - risk of airway placement if required - damage to eyes, teeth, lips or other oral mucosa - nerve damage due to positioning  - sore throat or hoarseness - Damage to heart, brain, nerves, lungs, other parts of body or loss of life  Patient voiced understanding.)        Anesthesia Quick Evaluation

## 2021-01-23 NOTE — Anesthesia Postprocedure Evaluation (Signed)
Anesthesia Post Note  Patient: [name] [name]  Procedure(s) Performed: COLONOSCOPY  Patient location during evaluation: Endoscopy Anesthesia Type: General Level of consciousness: awake and alert Pain management: pain level controlled Vital Signs Assessment: post-procedure vital signs reviewed and stable Respiratory status: spontaneous breathing, nonlabored ventilation, respiratory function stable and patient connected to nasal cannula oxygen Cardiovascular status: blood pressure returned to baseline and stable Postop Assessment: no apparent nausea or vomiting Anesthetic complications: no   No notable events documented.   Last Vitals:  Vitals:   01/23/21 0712 01/23/21 0750  BP: 124/79 (!) 95/50  Pulse: 86 83  Resp: 18 18  Temp: 36.4 C 36.6 C  SpO2: 98% 98%    Last Pain:  Vitals:   01/23/21 0810  TempSrc:   PainSc: 0-No pain                 Precious Haws Eliav Mechling

## 2021-01-24 ENCOUNTER — Encounter: Payer: Self-pay | Admitting: Gastroenterology

## 2021-01-24 LAB — SURGICAL PATHOLOGY

## 2021-01-29 ENCOUNTER — Encounter: Payer: Self-pay | Admitting: Gastroenterology

## 2021-02-04 ENCOUNTER — Other Ambulatory Visit: Payer: Self-pay

## 2021-02-04 ENCOUNTER — Encounter
Admission: RE | Admit: 2021-02-04 | Discharge: 2021-02-04 | Disposition: A | Discharge status: Home / Self Care | Payer: Managed Care, Other (non HMO) | Source: Ambulatory Visit | Attending: Surgery | Admitting: Surgery

## 2021-02-04 ENCOUNTER — Encounter: Payer: Self-pay | Admitting: Surgery

## 2021-02-04 ENCOUNTER — Telehealth: Payer: Self-pay | Admitting: Surgery

## 2021-02-04 ENCOUNTER — Ambulatory Visit (INDEPENDENT_AMBULATORY_CARE_PROVIDER_SITE_OTHER): Payer: Managed Care, Other (non HMO) | Admitting: Surgery

## 2021-02-04 VITALS — BP 137/72 | HR 67 | Temp 98.4°F | Ht 72.0 in | Wt 192.0 lb

## 2021-02-04 DIAGNOSIS — M6208 Separation of muscle (nontraumatic), other site: Secondary | ICD-10-CM

## 2021-02-04 DIAGNOSIS — K432 Incisional hernia without obstruction or gangrene: Secondary | ICD-10-CM

## 2021-02-04 HISTORY — DX: Chronic obstructive pulmonary disease, unspecified: J44.9

## 2021-02-04 HISTORY — DX: Malignant (primary) neoplasm, unspecified: C80.1

## 2021-02-04 NOTE — Patient Instructions (Addendum)
We will cancel your surgery.  We will have you follow up here in 6 months.    Please call and ask to speak with a nurse if you develop questions or concerns.

## 2021-02-04 NOTE — Progress Notes (Signed)
Patient ID: [name] [name], male   DOB: March 22, 1961, 60 y.o.   MRN: [id number]  Chief Complaint: Hernia recurrence  History of Present Illness This gentleman presents today anticipating proceeding with surgical repair of his postoperative incisional hernias.  He has completed his treatment for his renal carcinoma.  He reports he is having little to no pain, he is being quite cautious because of his awareness of the hernias, along with the associated diastases recti.  We discussed with him what his overall surgical goals are it is to make the diastases recti disappear.  I explained to him that reapproximation of the recti in the midline would involve a more extensive operation which does not appear warranted considering the small size of the incisional defects that are present. I explained that my strategy would be to eliminate the small incisional defects, reinforce this area with mesh.  However, I suspect his diastases recti would persist despite these efforts.  At this point in time hernia repair is quite elective and he is relatively asymptomatic.  He does not have a lifestyle that requires him to do much strenuous work.  Though he is somewhat limited by his need to be careful and not create problems.  He does not wear an abdominal binder but he had 1 after his last surgery which he only wore for pain but not to enable him to increase his activity.  Previously: [name] [name] is a 60 y.o. male with history of laparoscopic converted open with primary repair of umbilical defect following repair iatrogenic colonic injury.  He had a large diastases recti associated with his hernia prior to the repair attempt last May 2021. He does not complain of any pain now, still has a large diastases recti.  Does not know any particular types of alleviating or aggravating issues.  And has no particular concerns about the area of the previous hernia with exception of its softer character.  It seems he may be just  be very proactive in terms of preventing any potential recurrence which he knows he is at risk for.  Past Medical History Past Medical History:  Diagnosis Date   Anemia    pernicious anemia   Cancer (HCC)    renal   Chest pain    Unspecified   Chronic gouty arthritis 12/26/2015   Chronic kidney disease    COPD (chronic obstructive pulmonary disease) (HCC)    Dysrhythmia    pac's.  okay now.   GERD (gastroesophageal reflux disease)    History of kidney stones    Hypertension    Hypertriglyceridemia    Kidney stone on left side    2018   Palpitations    Peripheral vascular disease ([place]) 2019   Pre-diabetes    Renal insufficiency    Renal mass    Thyroid nodule 2019   being monitored      Past Surgical History:  Procedure Laterality Date   COLONOSCOPY N/A 01/23/2021   Procedure: COLONOSCOPY;  Surgeon: Lucilla Lame, MD;  Location: [facility] END[facility];  Service: Endoscopy;  Laterality: N/A;   CYSTOSCOPY W/ RETROGRADES Bilateral 03/23/2017   Procedure: CYSTOSCOPY WITH RETROGRADE PYELOGRAM;  Surgeon: Abbie Sons, MD;  Location: [facility];  Service: Urology;  Laterality: Bilateral;   CYSTOSCOPY W/ URETERAL STENT PLACEMENT Left 03/31/2017   Procedure: CYSTOSCOPY WITH STENT REPLACEMENT;  Surgeon: Abbie Sons, MD;  Location: [facility];  Service: Urology;  Laterality: Left;   CYSTOSCOPY/RETROGRADE/URETEROSCOPY/STONE EXTRACTION WITH BASKET Left 03/31/2017   Procedure: CYSTOSCOPY/RETROGRADE/URETEROSCOPY/STONE EXTRACTION  WITH BASKET;  Surgeon: Abbie Sons, MD;  Location: [facility];  Service: Urology;  Laterality: Left;   CYSTOSCOPY/URETEROSCOPY/HOLMIUM LASER/STENT PLACEMENT Left 03/23/2017   Procedure: CYSTOSCOPY/URETEROSCOPY/HOLMIUM LASER/STENT PLACEMENT;  Surgeon: Abbie Sons, MD;  Location: [facility];  Service: Urology;  Laterality: Left;   ESOPHAGOGASTRODUODENOSCOPY (EGD) WITH PROPOFOL N/A 10/22/2015   Procedure: ESOPHAGOGASTRODUODENOSCOPY (EGD) WITH PROPOFOL;  Surgeon:  Lucilla Lame, MD;  Location: [facility];  Service: Endoscopy;  Laterality: N/A;   EXTRACORPOREAL SHOCK WAVE LITHOTRIPSY  2016   EXTRACORPOREAL SHOCK WAVE LITHOTRIPSY Left 09/13/2014   Procedure: EXTRACORPOREAL SHOCK WAVE LITHOTRIPSY (ESWL);  Surgeon: Hollice Espy, MD;  Location: [facility];  Service: Urology;  Laterality: Left;   HERNIA REPAIR     IR RADIOLOGIST EVAL & MGMT  08/01/2020   IR RADIOLOGIST EVAL & MGMT  09/17/2020   IR RADIOLOGIST EVAL & MGMT  01/21/2021   LOWER EXTREMITY ANGIOGRAPHY Left 07/15/2017   Procedure: LOWER EXTREMITY ANGIOGRAPHY;  Surgeon: Algernon Huxley, MD;  Location: [place] [facility];  Service: Cardiovascular;  Laterality: Left;   lower extremity interventions x2 right leg Right    stent   PERIPHERAL VASCULAR CATHETERIZATION Right 08/20/2014   Procedure: Lower Extremity Angiography;  Surgeon: Algernon Huxley, MD;  Location: [place] [facility];  Service: Cardiovascular;  Laterality: Right;   PERIPHERAL VASCULAR CATHETERIZATION Right 08/20/2014   Procedure: Lower Extremity Intervention;  Surgeon: Algernon Huxley, MD;  Location: [place] [facility];  Service: Cardiovascular;  Laterality: Right;   RADIOLOGY WITH ANESTHESIA N/A 08/28/2020   Procedure: RADIOLOGY WITH ANESTHESIA CT WITH CRYOABLATION;  Surgeon: Greggory Keen, MD;  Location: [facility];  Service: Anesthesiology;  Laterality: N/A;   VENTRAL HERNIA REPAIR  08/11/2019   Procedure: HERNIA REPAIR VENTRAL ADULT WITH REPAIR OF LARGE INTESTINE LACERATION;  Surgeon: Herbert Pun, MD;  Location: [facility];  Service: General;;   XI ROBOTIC ASSISTED VENTRAL HERNIA N/A 08/11/2019   Procedure: XI ROBOTIC ASSISTED VENTRAL HERNIA;  Surgeon: Herbert Pun, MD;  Location: [facility];  Service: General;  Laterality: N/A;  Converted to open procedure    No Known Allergies  Current Outpatient Medications  Medication Sig Dispense Refill   amLODipine (NORVASC) 10 MG tablet Take 10 mg by mouth daily.     aspirin EC 81 MG tablet  Take 81 mg by mouth daily.     clopidogrel (PLAVIX) 75 MG tablet Take 75 mg by mouth daily.     metoprolol (TOPROL-XL) 200 MG 24 hr tablet Take 200 mg by mouth daily.      Multiple Vitamin (MULTIVITAMIN WITH MINERALS) TABS tablet Take 1 tablet by mouth daily.     omega-3 acid ethyl esters (LOVAZA) 1 G capsule Take 4 g by mouth daily.      pantoprazole (PROTONIX) 40 MG tablet Take 40 mg by mouth daily.     rosuvastatin (CRESTOR) 40 MG tablet Take 40 mg by mouth daily.      No current facility-administered medications for this visit.    Family History Family History  Problem Relation Age of Onset   Heart failure Other       Social History Social History   Tobacco Use   Smoking status: Former    Packs/day: 1.00    Years: 37.00    Pack years: 37.00    Types: Cigarettes    Quit date: 04/21/2013    Years since quitting: 7.7   Smokeless tobacco: Never  Vaping Use   Vaping Use: Never used  Substance Use Topics  Alcohol use: Yes    Alcohol/week: 14.0 standard drinks    Types: 7 Glasses of wine, 7 Cans of beer per week    Comment: trying to cut back on daily consumption   Drug use: No        Review of Systems  Constitutional: Negative.   HENT: Negative.    Eyes: Negative.   Respiratory: Negative.    Cardiovascular: Negative.   Gastrointestinal: Negative.   Genitourinary: Negative.   Skin: Negative.   Neurological: Negative.   Psychiatric/Behavioral: Negative.       Physical Exam Blood pressure 137/72, pulse 67, temperature 98.4 F (36.9 C), height 6' (1.829 m), weight 192 lb (87.1 kg), SpO2 97 %. Last Weight  Most recent update: 02/04/2021  2:07 PM    Weight  87.1 kg (192 lb)             CONSTITUTIONAL: Well developed, and nourished, appropriately responsive and aware without distress.   EYES: Sclera non-icteric.   EARS, NOSE, MOUTH AND THROAT: Mask worn.    Hearing is intact to voice.  NECK: Trachea is midline, and there is no jugular venous distension.   LYMPH NODES:  Lymph nodes in the neck are not enlarged. RESPIRATORY:  Lungs are clear, and breath sounds are equal bilaterally. Normal respiratory effort without pathologic use of accessory muscles. CARDIOVASCULAR: Heart is regular in rate and rhythm. GI: The abdomen is scarred with an umbilical to xiphoid scar, wide diastases recti readily apparent, without minimal appreciable fascial defects within the diastases.  There is no obviously focal bulging consistent with the underlying incisional defects that were noted on the CT.  Otherwise soft, nontender, and nondistended. There are no palpable masses. I did not appreciate hepatosplenomegaly. There were normal bowel sounds. MUSCULOSKELETAL:  Symmetrical muscle tone appreciated in all four extremities.  Wearing diabetic shoes/brace. SKIN: Skin turgor is normal. No pathologic skin lesions appreciated.  NEUROLOGIC:  Motor appears grossly normal.  Cranial nerves are grossly without defect. PSYCH:  Alert and oriented to person, place and time. Affect is appropriate for situation.  Data Reviewed I have personally reviewed what is currently available of the patient's imaging, recent labs and medical records.   Labs:  CBC Latest Ref Rng & Units 08/28/2020 08/20/2020 08/11/2019  WBC 4.0 - 10.5 K/uL 5.6 6.8 14.0(H)  Hemoglobin 13.0 - 17.0 g/dL 13.2 14.4 13.0  Hematocrit 39.0 - 52.0 % 39.1 43.1 38.1(L)  Platelets 150 - 400 K/uL 100(L) 117(L) 120(L)   CMP Latest Ref Rng & Units 01/17/2021 08/20/2020 07/12/2020  Glucose 70 - 99 mg/dL - 143(H) -  BUN 8 - 23 mg/dL - 15 -  Creatinine 0.61 - 1.24 mg/dL 0.90 1.01 1.00  Sodium 135 - 145 mmol/L - 142 -  Potassium 3.5 - 5.1 mmol/L - 4.3 -  Chloride 98 - 111 mmol/L - 109 -  CO2 22 - 32 mmol/L - 23 -  Calcium 8.9 - 10.3 mg/dL - 8.8(L) -  Total Protein 6.5 - 8.1 g/dL - - -  Total Bilirubin 0.3 - 1.2 mg/dL - - -  Alkaline Phos 38 - 126 U/L - - -  AST 15 - 41 U/L - - -  ALT 17 - 63 U/L - - -      Imaging: CLINICAL DATA:  History of RCC, status post cryoablation on Aug 28, 2020, initial follow-up examination.   EXAM: CT ABDOMEN WITHOUT AND WITH CONTRAST   TECHNIQUE: Multidetector CT imaging of the abdomen was performed following the standard  protocol before and following the bolus administration of intravenous contrast.   CONTRAST:  110mL OMNIPAQUE IOHEXOL 350 MG/ML SOLN   COMPARISON:  CT July 12, 2020   FINDINGS: Lower chest: No acute abnormality.   Hepatobiliary: Enlargement of the lateral segment left lobe of the liver with widening of the fissures and slight contour nodularity suggestive of hepatic cirrhosis. Tiny focus of arterial enhancement along the anterior aspect of segmentIVa along the falciform ligament on image 34/8 which equilibrates to background liver on subsequent delayed imaging and likely reflects an intrahepatic shunt/altered area perfusion. Gallbladder is unremarkable. No biliary ductal dilation.   Pancreas: No pancreatic ductal dilation or evidence of acute inflammation.   Spleen: No splenomegaly.   Adrenals/Urinary Tract: Similar thickening of the left adrenal gland, favor hyperplasia. Right adrenal glands unremarkable.   No hydronephrosis. Punctate nonobstructive left renal calculi. Bilateral lobular renal contour.   Postprocedural changes of cryoablation of a right interpolar RCC with the cryo ablation zone measuring 2.5 x 2.0 cm on image 76/8. This is the first follow-up imaging post therapy. No suspicious residual enhancement.   Exophytic nonenhancing hemorrhagic/proteinaceous left lower pole renal cyst.   Stomach/Bowel: Small hiatal hernia otherwise the stomach is unremarkable for degree of distension. No pathologic dilation or evidence of inflammation involving loops of small or large bowel in the abdomen. Appendix is partially visualized and appears normal.   Vascular/Lymphatic: The renal veins and IVC appear normal.  No abdominal aortic aneurysm. Aortic and branch vessel atherosclerosis. No pathologically enlarged abdominal or pelvic lymph nodes. Similar prominent periportal lymph nodes are favored reactive.   Other: Richter type fat and nonobstructed small bowel containing ventral hernia with a 2.6 cm aperture with. No abdominopelvic ascites. Prior ventral hernia repair. No discrete peritoneal nodularity.   Musculoskeletal: Multilevel degenerative changes spine. No aggressive lytic or blastic lesion of bone.   IMPRESSION: 1. Cryoablation changes of a right interpolar RCC with the treatment zone measuring up to 2.5 cm. No evidence of residual disease on this initial follow-up examination. 2. No evidence of abdominal metastases or tumor in vein. 3. CT imaging findings suggestive of hepatic cirrhosis. 4. Richter type fat and nonobstructed small bowel containing ventral hernia. 5. Punctate nonobstructive left nephrolithiasis. 6.  Aortic Atherosclerosis (ICD10-I70.0).     Electronically Signed   By: Dahlia Bailiff M.D.   On: 01/19/2021 16:57  Within last 24 hrs: No results found.   Assessment Multiple midline fascial defects, in the midst of known/longstanding wide epigastric rectus diastases.  Patient Active Problem List   Diagnosis Date Noted   Special screening for malignant neoplasms, colon    Polyp of sigmoid colon    Incisional hernia, without obstruction or gangrene 12/31/2020   Renal cell cancer, right ([place]) 08/28/2020   Diastasis recti 07/02/2020   Ventral hernia 08/11/2019   Coronary artery disease involving native coronary artery 06/20/2018   Emphysema lung ([place]) 06/20/2018   Hyperglycemia 05/06/2017   Uric acid nephrolithiasis 04/28/2017   Swelling of limb 04/20/2017   ARF (acute renal failure) ([place]) 02/06/2017   Carotid stenosis 08/21/2016   Atherosclerosis of native arteries of extremity with intermittent claudication ([place]) 08/21/2016   Ataxia 04/30/2016   Diplopia  04/30/2016   Chronic gouty arthritis 12/26/2015   Esophagitis, unspecified    Portal hypertension (HCC)    Absolute anemia    Other acute pancreatitis    Acute blood loss anemia    AKI (acute kidney injury) ([place]) 10/19/2015   Health care maintenance 05/23/2015   Pernicious  anemia 05/23/2015   Multinodular goiter 01/10/2015   Elevated PSA 08/29/2014   GERD (gastroesophageal reflux disease) 08/29/2014   Gross hematuria 08/29/2014   History of tobacco use 01/13/2014   Benign essential HTN 09/03/2013   Chronic venous insufficiency 09/03/2013   PVD (peripheral vascular disease) ([place]) 09/03/2013   Palpitations 07/14/2011   HYPERTRIGLYCERIDEMIA 09/13/2008   CHEST PAIN-UNSPECIFIED 09/13/2008    Plan   We discussed abdominal binder.   We also discussed that the diastases recti would likely remain prominent following a mesh repair.  He may consider abdominal wall reconstruction, but at this time due to the relative lack of symptoms associated with these small midline defects, he felt reluctant to proceed with elective repair. He reports there is really no particular activities he is avoiding are unable to do due to these hernias, he has had no bowel habit changes, no particular lifestyle changes due to the presence of the hernia.  His biggest concern is the appearance of the diastases. We discussed wearing an abdominal binder and reassessing in a short interval to determine goals of proceeding with elective ventral hernia repair. I would like to have him follow-up after a short interval, to discuss repair with him further.  Ronny Bacon M.D., FACS 02/04/2021, 2:12 PM

## 2021-02-04 NOTE — Telephone Encounter (Signed)
Per Dr. Christian Mate cancel surgery for 02/12/21, no longer needed.  He saw patient in clinic today and patient agreed.  Patient aware surgery cancelled.

## 2021-02-04 NOTE — Patient Instructions (Signed)
Your procedure is scheduled on: Wed. 11/9 Report to registration desk in the medical mall then to Day Surgery. To find out your arrival time please call [phone] between 1PM - 3PM on Tues 11/8.  Remember: Instructions that are not followed completely may result in serious medical risk,  up to and including death, or upon the discretion of your surgeon and anesthesiologist your  surgery may need to be rescheduled.     _X__ 1. Do not eat food after midnight the night before your procedure.                 No chewing gum or hard candies. You may drink clear liquids up to 2 hours                 before you are scheduled to arrive for your surgery- DO not drink clear                 liquids within 2 hours of the start of your surgery.                 Clear Liquids include:  water, apple juice without pulp, clear Gatorade, G2 or                  Gatorade Zero (avoid Red/Purple/Blue), Black Coffee or Tea (Do not add                 anything to coffee or tea). __X__2.  On the morning of surgery brush your teeth with toothpaste and water, you                may rinse your mouth with mouthwash if you wish.  Do not swallow any toothpaste of mouthwash.     _X__ 3.  No Alcohol for 24 hours before or after surgery.   ___ 4.  Do Not Smoke or use e-cigarettes For 24 Hours Prior to Your Surgery.                 Do not use any chewable tobacco products for at least 6 hours prior to                 Surgery.  ___  5.  Do not use any recreational drugs (marijuana, cocaine, heroin, ecstasy,  MDMA or other) For at least one week prior to your surgery.   Combination of these drugs with anesthesia may have life threatening  results.  ____  6.  Bring all medications with you on the day of surgery if instructed.   __x__  7.  Notify your doctor if there is any change in your medical condition      (cold, fever, infections).     Do not wear jewelry,  Do not wear lotions, powders,  You  may wear deodorant. Do not shave 48 hours prior to surgery. Men may shave face and neck. Do not bring valuables to the hospital.    [facility] is not responsible for any belongings or valuables.  Contacts, dentures or bridgework may not be worn into surgery. Leave your suitcase in the car. After surgery it may be brought to your room. For patients admitted to the hospital, discharge time is determined by your treatment team.   Patients discharged the day of surgery will not be allowed to drive home.   Make arrangements for someone to be with you for the first 24 hours of your Same Day Discharge.    Please  read over the following fact sheets that you were given:       __x__ Take these medicines the morning of surgery with A SIP OF WATER:    1. pantoprazole (PROTONIX) 40 MG tablet  dose the night before and the morning of surgery  2. rosuvastatin (CRESTOR) 40 MG tablet  3. metoprolol (TOPROL-XL) 200 MG 24 hr tablet  4.amLODipine (NORVASC) 10 MG tablet  5.  6.  ____ Fleet Enema (as directed)   __x__ Use CHG Soap (or wipes) as directed ( Pick up when you do Covid test)  ____ Use Benzoyl Peroxide Gel as instructed  ____ Use inhalers on the day of surgery  ____ Stop metformin 2 days prior to surgery    ____ Take 1/2 of usual insulin dose the night before surgery. No insulin the morning          of surgery.   ___x_ Stop Plavix/aspirin as per MD's instructions  ___x_ Stop Anti-inflammatories aleve and ibuprofen until after the surgery.  May take tylenol   ___x_ Stop supplements until after surgery.  Multiple Vitamin (MULTIVITAMIN WITH MINERALS) TABS tablet, omega-3 acid ethyl esters (LOVAZA) 1 G capsule2  ____ Bring C-Pap to the hospital.    If you have any questions regarding your pre-procedure instructions,  Please call Pre-admit Testing at [phone]

## 2021-02-04 NOTE — Pre-Procedure Instructions (Signed)
Patient will be seen in Dr. Forest Becker office this afternoon. Patient to request labs be done at [facility].  He will receive instructions from MD on when to stop his Plavix and aspirin.  He has a Covid appointment for 11/7.  Instructed it is not required.  However he said he would like to get it done to be safe.

## 2021-02-10 ENCOUNTER — Other Ambulatory Visit: Payer: Managed Care, Other (non HMO)

## 2021-02-12 ENCOUNTER — Ambulatory Visit: Payer: Managed Care, Other (non HMO) | Admitting: Podiatry

## 2021-02-12 ENCOUNTER — Ambulatory Visit: Admit: 2021-02-12 | Payer: Managed Care, Other (non HMO) | Admitting: Surgery

## 2021-02-12 SURGERY — REPAIR, HERNIA, VENTRAL, ROBOT-ASSISTED
Anesthesia: General

## 2021-02-17 ENCOUNTER — Ambulatory Visit
Admission: RE | Admit: 2021-02-17 | Discharge: 2021-02-17 | Disposition: A | Discharge status: Home / Self Care | Payer: Managed Care, Other (non HMO) | Source: Ambulatory Visit | Attending: Urology | Admitting: Urology

## 2021-02-17 ENCOUNTER — Other Ambulatory Visit: Payer: Self-pay

## 2021-02-17 ENCOUNTER — Ambulatory Visit
Admission: RE | Admit: 2021-02-17 | Discharge: 2021-02-17 | Disposition: A | Discharge status: Home / Self Care | Payer: Managed Care, Other (non HMO) | Attending: Urology | Admitting: Urology

## 2021-02-17 ENCOUNTER — Ambulatory Visit (INDEPENDENT_AMBULATORY_CARE_PROVIDER_SITE_OTHER): Payer: Managed Care, Other (non HMO) | Admitting: Urology

## 2021-02-17 ENCOUNTER — Encounter: Payer: Self-pay | Admitting: Urology

## 2021-02-17 VITALS — BP 125/77 | HR 79 | Ht 72.0 in | Wt 193.0 lb

## 2021-02-17 DIAGNOSIS — N200 Calculus of kidney: Secondary | ICD-10-CM | POA: Diagnosis not present

## 2021-02-17 NOTE — Progress Notes (Signed)
02/17/2021 11:44 AM   [name] [name] 1960-11-17 [id number]  Referring provider: Kirk Ruths, MD [facility] [place],   [id number]  Chief Complaint  Patient presents with   Nephrolithiasis    Urologic history:  1.  Recurrent nephrolithiasis SWL 2016 Staged ureteroscopy 03/2017 left renal ureteral calculi 8 cm left lower pole calculus CT 2022 Stone analysis in 2018 was 100% uric acid Declined metabolic evaluation  2.  Right renal mass Incidental 2 cm solid, enhancing right renal mass 2022 Percutaneous ablation by IR 08/2020; no biopsy performed  HPI: 60 y.o. presents for follow-up visit.  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  okay now.   GERD (gastroesophageal reflux disease)    History of kidney stones    Hypertension    Hypertriglyceridemia    Kidney stone on left side    2018   Palpitations    Peripheral vascular disease ([place]) 2019   Pre-diabetes    Renal insufficiency    Renal mass    Thyroid nodule 2019   being monitored    Surgical History: Past Surgical History:  Procedure Laterality Date   COLONOSCOPY N/A 01/23/2021   Procedure: COLONOSCOPY;  Surgeon: Lucilla Lame, MD;  Location: [facility] ENDOSCOPY;  Service: Endoscopy;  Laterality: N/A;   CYSTOSCOPY W/ RETROGRADES Bilateral 03/23/2017   Procedure: CYSTOSCOPY WITH RETROGRADE PYELOGRAM;  Surgeon: Abbie Sons, MD;  Location: [facility] ORS;  Service:  Urology;  Laterality: Bilateral;   CYSTOSCOPY W/ URETERAL STENT PLACEMENT Left 03/31/2017   Procedure: CYSTOSCOPY WITH STENT REPLACEMENT;  Surgeon: Abbie Sons, MD;  Location: [facility] ORS;  Service: Urology;  Laterality: Left;   CYSTOSCOPY/RETROGRADE/URETEROSCOPY/STONE EXTRACTION WITH BASKET Left 03/31/2017   Procedure: CYSTOSCOPY/RETROGRADE/URETEROSCOPY/STONE EXTRACTION WITH BASKET;  Surgeon: Abbie Sons, MD;  Location: [facility] ORS;  Service: Urology;  Laterality: Left;   CYSTOSCOPY/URETEROSCOPY/HOLMIUM LASER/STENT PLACEMENT Left 03/23/2017   Procedure: CYSTOSCOPY/URETEROSCOPY/HOLMIUM LASER/STENT PLACEMENT;  Surgeon: Abbie Sons, MD;  Location: [facility] ORS;  Service: Urology;  Laterality: Left;   ESOPHAGOGASTRODUODENOSCOPY (EGD) WITH PROPOFOL N/A 10/22/2015   Procedure: ESOPHAGOGASTRODUODENOSCOPY (EGD) WITH PROPOFOL;  Surgeon: Lucilla Lame, MD;  Location: [facility] ENDOSCOPY;  Service: Endoscopy;  Laterality: N/A;   EXTRACORPOREAL SHOCK WAVE LITHOTRIPSY  2016   EXTRACORPOREAL SHOCK WAVE LITHOTRIPSY Left 09/13/2014   Procedure: EXTRACORPOREAL SHOCK WAVE LITHOTRIPSY (ESWL);  Surgeon: Hollice Espy, MD;  Location: [facility] ORS;  Service: Urology;  Laterality: Left;   HERNIA REPAIR     IR RADIOLOGIST EVAL & MGMT  08/01/2020   IR RADIOLOGIST EVAL & MGMT  09/17/2020   IR RADIOLOGIST EVAL & MGMT  01/21/2021   LOWER EXTREMITY ANGIOGRAPHY Left 07/15/2017   Procedure: LOWER EXTREMITY ANGIOGRAPHY;  Surgeon: Algernon Huxley, MD;  Location: [place] CV LAB;  Service: Cardiovascular;  Laterality: Left;   lower extremity interventions x2 right leg Right    stent   PERIPHERAL VASCULAR CATHETERIZATION Right 08/20/2014   Procedure: Lower Extremity  Angiography;  Surgeon: Algernon Huxley, MD;  Location: [place] [facility];  Service: Cardiovascular;  Laterality: Right;   PERIPHERAL VASCULAR CATHETERIZATION Right 08/20/2014   Procedure: Lower Extremity Intervention;  Surgeon: Algernon Huxley, MD;  Location: [place] [facility];   Service: Cardiovascular;  Laterality: Right;   RADIOLOGY WITH ANESTHESIA N/A 08/28/2020   Procedure: RADIOLOGY WITH ANESTHESIA CT WITH CRYOABLATION;  Surgeon: Greggory Keen, MD;  Location: [facility] ORS;  Service: Anesthesiology;  Laterality: N/A;   VENTRAL HERNIA REPAIR  08/11/2019   Procedure: HERNIA REPAIR VENTRAL ADULT WITH REPAIR OF LARGE INTESTINE LACERATION;  Surgeon: Herbert Pun, MD;  Location: [facility] ORS;  Service: General;;   XI ROBOTIC ASSISTED VENTRAL HERNIA N/A 08/11/2019   Procedure: XI ROBOTIC ASSISTED VENTRAL HERNIA;  Surgeon: Herbert Pun, MD;  Location: [facility] ORS;  Service: General;  Laterality: N/A;  Converted to open procedure    Home Medications:  Allergies as of 02/17/2021   No Known Allergies      Medication List        Accurate as of February 17, 2021 11:44 AM. If you have any questions, ask your nurse or doctor.          amLODipine 10 MG tablet Commonly known as: NORVASC Take 10 mg by mouth daily.   aspirin EC 81 MG tablet Take 81 mg by mouth daily.   clopidogrel 75 MG tablet Commonly known as: PLAVIX Take 75 mg by mouth daily.   metoprolol 200 MG 24 hr tablet Commonly known as: TOPROL-XL Take 200 mg by mouth daily.   multivitamin with minerals Tabs tablet Take 1 tablet by mouth daily.   omega-3 acid ethyl esters 1 g capsule Commonly known as: LOVAZA Take 4 g by mouth daily.   pantoprazole 40 MG tablet Commonly known as: PROTONIX Take 40 mg by mouth daily.   rosuvastatin 40 MG tablet Commonly known as: CRESTOR Take 40 mg by mouth daily.        Allergies: No Known Allergies  Family History: Family History  Problem Relation Age of Onset   Heart failure Other     Social History:  reports that he quit smoking about 7 years ago. His smoking use included cigarettes. He has a 37.00 pack-year smoking history. He has never used smokeless tobacco. He reports current alcohol use of about 14.0 standard drinks per week. He reports  that he does not use drugs.   Physical Exam: BP 125/77 (BP Location: Left Arm, Patient Position: Sitting, Cuff Size: Large)   Pulse 79   Ht 6' (1.829 m)   Wt 193 lb (87.5 kg)   BMI 26.18 kg/m   Constitutional:  Alert and oriented, No acute distress. HEENT: [place] AT, moist mucus membranes.  Trachea midline, no masses. Cardiovascular: No clubbing, cyanosis, or edema. Respiratory: Normal respiratory effort, no increased work of breathing. Psychiatric: Normal mood and affect.   Pertinent Imaging: CT images personally reviewed and interpreted  Assessment & Plan:    1.  Left nephrolithiasis Asymptomatic, nonobstructing left lower pole renal calculus Options were discussed including observation, shockwave lithotripsy and ureteroscopic removal He may be interested in shockwave lithotripsy to avoid renal colic in the future KUB was ordered to see if stone is visualized on plain film and he will be notified with results  2.  History of right renal mass Successful percutaneous cryoablation   Abbie Sons, MD  [facility] [address], [place], [place] [address] [phone]

## 2021-02-19 ENCOUNTER — Encounter: Payer: Self-pay | Admitting: *Deleted

## 2021-03-18 ENCOUNTER — Ambulatory Visit: Payer: Self-pay | Admitting: Surgery

## 2021-04-16 ENCOUNTER — Telehealth: Payer: Self-pay | Admitting: Acute Care

## 2021-04-16 NOTE — Telephone Encounter (Signed)
Spoke with pt regarding scheduling for f/u lung cancer screening CT. Pt wants to wait for now due to other scans he has coming up. Pt requested a call back next year to schedule.

## 2021-05-01 NOTE — Progress Notes (Addendum)
Anesthesia Review:  PCP: DR Frazier Richards  LOV 10/22/20  Cardiologist : none  Chest x-ray : EKG : 08/20/20  Carotids- 09/17/20  Echo : Stress test: Cardiac Cath :  Activity level: can do a flight of stairs without difficulty  Sleep Study/ CPAP : none  Fasting Blood Sugar :      / Checks Blood Sugar -- times a day:   Blood Thinner/ Instructions /Last Dose: ASA / Instructions/ Last Dose :   81 mg aspirin and Plavix - last dose of Plavix on 05/04/21.  Covid test done 05/06/21 at am  Prediabetes- does not check glucose ats home  Hgba1c-05/01/21- 6.4 on chart CMP- done 05/01/21 on chart

## 2021-05-01 NOTE — Progress Notes (Signed)
Covid test on 05/06/21 at 0930am .            Your procedure is scheduled on:              05/09/21   Report to [facility] Main  Entrance   Report to admitting at  1115 AM     Call this number if you have problems the morning of surgery [phone]    REMEMBER: NO  SOLID FOOD CANDY OR GUM AFTER MIDNIGHT. CLEAR LIQUIDS UNTIL   1030am        . NOTHING BY MOUTH EXCEPT CLEAR LIQUIDS UNTIL   1030am  . PLEASE FINISH ENSURE DRINK PER SURGEON ORDER  WHICH NEEDS TO BE COMPLETED AT   1030am    .      CLEAR LIQUID DIET   Foods Allowed                                                                    Coffee and tea, regular and decaf                            Fruit ices (not with fruit pulp)                                      Iced Popsicles                                    Carbonated beverages, regular and diet                                    Cranberry, grape and apple juices Sports drinks like Gatorade Lightly seasoned clear broth or consume(fat free) Sugar, honey syrup ___________________________________________________________________      BRUSH YOUR TEETH MORNING OF SURGERY AND RINSE YOUR MOUTH OUT, NO CHEWING GUM CANDY OR MINTS.     Take these medicines the morning of surgery with A SIP OF WATER:  amlodpine, toprol, protonix   DO NOT TAKE ANY DIABETIC MEDICATIONS DAY OF YOUR SURGERY                               You may not have any metal on your body including hair pins and              piercings  Do not wear jewelry, make-up, lotions, powders or perfumes, deodorant             Do not wear nail polish on your fingernails.  Do not shave  48 hours prior to surgery.              Men may shave face and neck.   Do not bring valuables to the hospital. [place].  Contacts, dentures or bridgework may not be worn into surgery.  Leave suitcase in  the car. After surgery it may be brought to your room.     Patients  discharged the day of surgery will not be allowed to drive home. IF YOU ARE HAVING SURGERY AND GOING HOME THE SAME DAY, YOU MUST HAVE AN ADULT TO DRIVE YOU HOME AND BE WITH YOU FOR 24 HOURS. YOU MAY GO HOME BY TAXI OR UBER OR ORTHERWISE, BUT AN ADULT MUST ACCOMPANY YOU HOME AND STAY WITH YOU FOR 24 HOURS.  Name and phone number of your driver:  Special Instructions: N/A              Please read over the following fact sheets you were given: _____________________________________________________________________  [facility] - Preparing for Surgery Before surgery, you can play an important role.  Because skin is not sterile, your skin needs to be as free of germs as possible.  You can reduce the number of germs on your skin by washing with CHG (chlorahexidine gluconate) soap before surgery.  CHG is an antiseptic cleaner which kills germs and bonds with the skin to continue killing germs even after washing. Please DO NOT use if you have an allergy to CHG or antibacterial soaps.  If your skin becomes reddened/irritated stop using the CHG and inform your nurse when you arrive at Short Stay. Do not shave (including legs and underarms) for at least 48 hours prior to the first CHG shower.  You may shave your face/neck. Please follow these instructions carefully:  1.  Shower with CHG Soap the night before surgery and the  morning of Surgery.  2.  If you choose to wash your hair, wash your hair first as usual with your  normal  shampoo.  3.  After you shampoo, rinse your hair and body thoroughly to remove the  shampoo.                           4.  Use CHG as you would any other liquid soap.  You can apply chg directly  to the skin and wash                       Gently with a scrungie or clean washcloth.  5.  Apply the CHG Soap to your body ONLY FROM THE NECK DOWN.   Do not use on face/ open                           Wound or open sores. Avoid contact with eyes, ears mouth and genitals (private parts).                        Wash face,  Genitals (private parts) with your normal soap.             6.  Wash thoroughly, paying special attention to the area where your surgery  will be performed.  7.  Thoroughly rinse your body with warm water from the neck down.  8.  DO NOT shower/wash with your normal soap after using and rinsing off  the CHG Soap.                9.  Pat yourself dry with a clean towel.            10.  Wear clean pajamas.            11.  Place clean sheets  on your bed the night of your first shower and do not  sleep with pets. Day of Surgery : Do not apply any lotions/deodorants the morning of surgery.  Please wear clean clothes to the hospital/surgery center.  FAILURE TO FOLLOW THESE INSTRUCTIONS MAY RESULT IN THE CANCELLATION OF YOUR SURGERY PATIENT SIGNATURE_________________________________  NURSE SIGNATURE__________________________________  ________________________________________________________________________

## 2021-05-06 ENCOUNTER — Encounter (HOSPITAL_COMMUNITY)
Admission: RE | Admit: 2021-05-06 | Discharge: 2021-05-06 | Disposition: A | Discharge status: Home / Self Care | Payer: Managed Care, Other (non HMO) | Source: Ambulatory Visit | Attending: Surgery | Admitting: Surgery

## 2021-05-06 ENCOUNTER — Encounter (HOSPITAL_COMMUNITY): Payer: Self-pay

## 2021-05-06 ENCOUNTER — Other Ambulatory Visit: Payer: Self-pay

## 2021-05-06 ENCOUNTER — Other Ambulatory Visit (HOSPITAL_COMMUNITY): Payer: Managed Care, Other (non HMO)

## 2021-05-06 VITALS — BP 138/67 | HR 74 | Temp 98.2°F | Resp 16 | Ht 72.0 in | Wt 201.0 lb

## 2021-05-06 DIAGNOSIS — C641 Malignant neoplasm of right kidney, except renal pelvis: Secondary | ICD-10-CM

## 2021-05-06 DIAGNOSIS — R7303 Prediabetes: Secondary | ICD-10-CM | POA: Diagnosis not present

## 2021-05-06 DIAGNOSIS — Z20822 Contact with and (suspected) exposure to covid-19: Secondary | ICD-10-CM | POA: Diagnosis not present

## 2021-05-06 DIAGNOSIS — Z01812 Encounter for preprocedural laboratory examination: Secondary | ICD-10-CM | POA: Diagnosis present

## 2021-05-06 DIAGNOSIS — Z01818 Encounter for other preprocedural examination: Secondary | ICD-10-CM

## 2021-05-06 LAB — CBC
HCT: 43.8 % (ref 39.0–52.0)
Hemoglobin: 14.6 g/dL (ref 13.0–17.0)
MCH: 32.2 pg (ref 26.0–34.0)
MCHC: 33.3 g/dL (ref 30.0–36.0)
MCV: 96.5 fL (ref 80.0–100.0)
Platelets: 142 10*3/uL — ABNORMAL LOW (ref 150–400)
RBC: 4.54 MIL/uL (ref 4.22–5.81)
RDW: 12.2 % (ref 11.5–15.5)
WBC: 7.6 10*3/uL (ref 4.0–10.5)
nRBC: 0 % (ref 0.0–0.2)

## 2021-05-06 LAB — SARS CORONAVIRUS 2 (TAT 6-24 HRS): SARS Coronavirus 2: NEGATIVE

## 2021-05-06 LAB — GLUCOSE, CAPILLARY: Glucose-Capillary: 157 mg/dL — ABNORMAL HIGH (ref 70–99)

## 2021-05-09 ENCOUNTER — Ambulatory Visit (HOSPITAL_COMMUNITY)
Admission: RE | Admit: 2021-05-09 | Discharge: 2021-05-10 | Disposition: A | Discharge status: Home / Self Care | Payer: Managed Care, Other (non HMO) | Attending: Surgery | Admitting: Surgery

## 2021-05-09 ENCOUNTER — Other Ambulatory Visit: Payer: Self-pay

## 2021-05-09 ENCOUNTER — Encounter (HOSPITAL_COMMUNITY): Payer: Self-pay | Admitting: Surgery

## 2021-05-09 ENCOUNTER — Encounter (HOSPITAL_COMMUNITY)
Admission: RE | Disposition: A | Discharge status: Home / Self Care | Payer: Self-pay | Source: Home / Self Care | Attending: Surgery

## 2021-05-09 ENCOUNTER — Ambulatory Visit (HOSPITAL_COMMUNITY): Payer: Managed Care, Other (non HMO) | Admitting: Physician Assistant

## 2021-05-09 ENCOUNTER — Ambulatory Visit (HOSPITAL_COMMUNITY): Payer: Managed Care, Other (non HMO) | Admitting: Certified Registered Nurse Anesthetist

## 2021-05-09 DIAGNOSIS — G709 Myoneural disorder, unspecified: Secondary | ICD-10-CM | POA: Insufficient documentation

## 2021-05-09 DIAGNOSIS — I2510 Atherosclerotic heart disease of native coronary artery without angina pectoris: Secondary | ICD-10-CM | POA: Insufficient documentation

## 2021-05-09 DIAGNOSIS — Z7902 Long term (current) use of antithrombotics/antiplatelets: Secondary | ICD-10-CM | POA: Diagnosis not present

## 2021-05-09 DIAGNOSIS — K430 Incisional hernia with obstruction, without gangrene: Secondary | ICD-10-CM | POA: Diagnosis present

## 2021-05-09 DIAGNOSIS — K219 Gastro-esophageal reflux disease without esophagitis: Secondary | ICD-10-CM | POA: Diagnosis not present

## 2021-05-09 DIAGNOSIS — D510 Vitamin B12 deficiency anemia due to intrinsic factor deficiency: Secondary | ICD-10-CM | POA: Diagnosis not present

## 2021-05-09 DIAGNOSIS — K420 Umbilical hernia with obstruction, without gangrene: Secondary | ICD-10-CM | POA: Insufficient documentation

## 2021-05-09 DIAGNOSIS — J439 Emphysema, unspecified: Secondary | ICD-10-CM | POA: Diagnosis not present

## 2021-05-09 DIAGNOSIS — I129 Hypertensive chronic kidney disease with stage 1 through stage 4 chronic kidney disease, or unspecified chronic kidney disease: Secondary | ICD-10-CM | POA: Diagnosis not present

## 2021-05-09 DIAGNOSIS — K436 Other and unspecified ventral hernia with obstruction, without gangrene: Secondary | ICD-10-CM | POA: Insufficient documentation

## 2021-05-09 DIAGNOSIS — N183 Chronic kidney disease, stage 3 unspecified: Secondary | ICD-10-CM | POA: Diagnosis not present

## 2021-05-09 DIAGNOSIS — Z87891 Personal history of nicotine dependence: Secondary | ICD-10-CM | POA: Diagnosis not present

## 2021-05-09 DIAGNOSIS — I739 Peripheral vascular disease, unspecified: Secondary | ICD-10-CM | POA: Insufficient documentation

## 2021-05-09 HISTORY — PX: LYSIS OF ADHESION: SHX5961

## 2021-05-09 HISTORY — PX: VENTRAL HERNIA REPAIR: SHX424

## 2021-05-09 LAB — GLUCOSE, CAPILLARY: Glucose-Capillary: 136 mg/dL — ABNORMAL HIGH (ref 70–99)

## 2021-05-09 SURGERY — REPAIR, HERNIA, VENTRAL, LAPAROSCOPIC
Anesthesia: General

## 2021-05-09 MED ORDER — RINGERS IRRIGATION IR SOLN
Status: DC | PRN
  Administered 2021-05-09: 1

## 2021-05-09 MED ORDER — LIP MEDEX EX OINT
1.0000 "application " | TOPICAL_OINTMENT | Freq: Two times a day (BID) | CUTANEOUS | Status: DC
  Administered 2021-05-09 – 2021-05-10 (×2): 1 via TOPICAL
  Filled 2021-05-09: qty 7

## 2021-05-09 MED ORDER — BUPIVACAINE-EPINEPHRINE (PF) 0.25% -1:200000 IJ SOLN
INTRAMUSCULAR | Status: AC
  Filled 2021-05-09: qty 60

## 2021-05-09 MED ORDER — ENSURE PRE-SURGERY PO LIQD
296.0000 mL | Freq: Once | ORAL | Status: DC
  Filled 2021-05-09: qty 296

## 2021-05-09 MED ORDER — ASPIRIN EC 81 MG PO TBEC
81.0000 mg | DELAYED_RELEASE_TABLET | Freq: Every day | ORAL | Status: DC
  Administered 2021-05-09 – 2021-05-10 (×2): 81 mg via ORAL
  Filled 2021-05-09 (×2): qty 1

## 2021-05-09 MED ORDER — OXYCODONE HCL 5 MG PO TABS
5.0000 mg | ORAL_TABLET | Freq: Once | ORAL | Status: AC | PRN
  Administered 2021-05-09: 5 mg via ORAL

## 2021-05-09 MED ORDER — SODIUM CHLORIDE 0.9% FLUSH
3.0000 mL | Freq: Two times a day (BID) | INTRAVENOUS | Status: DC
  Administered 2021-05-10: 3 mL via INTRAVENOUS

## 2021-05-09 MED ORDER — CHLORHEXIDINE GLUCONATE CLOTH 2 % EX PADS: 6.0000 | MEDICATED_PAD | Freq: Once | CUTANEOUS | Status: DC

## 2021-05-09 MED ORDER — MAGIC MOUTHWASH
15.0000 mL | Freq: Four times a day (QID) | ORAL | Status: DC | PRN
  Filled 2021-05-09: qty 15

## 2021-05-09 MED ORDER — PROCHLORPERAZINE MALEATE 10 MG PO TABS
10.0000 mg | ORAL_TABLET | Freq: Four times a day (QID) | ORAL | Status: DC | PRN
  Filled 2021-05-09: qty 1

## 2021-05-09 MED ORDER — CALCIUM POLYCARBOPHIL 625 MG PO TABS
625.0000 mg | ORAL_TABLET | Freq: Two times a day (BID) | ORAL | Status: DC
  Administered 2021-05-09 – 2021-05-10 (×2): 625 mg via ORAL
  Filled 2021-05-09 (×2): qty 1

## 2021-05-09 MED ORDER — METHOCARBAMOL 500 MG PO TABS: 1000.0000 mg | ORAL_TABLET | Freq: Four times a day (QID) | ORAL | Status: DC | PRN

## 2021-05-09 MED ORDER — ENALAPRILAT 1.25 MG/ML IV SOLN
0.6250 mg | Freq: Four times a day (QID) | INTRAVENOUS | Status: DC | PRN
  Filled 2021-05-09: qty 1

## 2021-05-09 MED ORDER — DIPHENHYDRAMINE HCL 12.5 MG/5ML PO ELIX: 12.5000 mg | ORAL_SOLUTION | Freq: Four times a day (QID) | ORAL | Status: DC | PRN

## 2021-05-09 MED ORDER — BISACODYL 10 MG RE SUPP: 10.0000 mg | Freq: Every day | RECTAL | Status: DC | PRN

## 2021-05-09 MED ORDER — METRONIDAZOLE 500 MG/100ML IV SOLN
500.0000 mg | Freq: Two times a day (BID) | INTRAVENOUS | Status: AC
  Administered 2021-05-09: 500 mg via INTRAVENOUS
  Filled 2021-05-09: qty 100

## 2021-05-09 MED ORDER — AMISULPRIDE (ANTIEMETIC) 5 MG/2ML IV SOLN: 10.0000 mg | Freq: Once | INTRAVENOUS | Status: DC | PRN

## 2021-05-09 MED ORDER — LACTATED RINGERS IV SOLN: INTRAVENOUS | Status: DC

## 2021-05-09 MED ORDER — ROCURONIUM BROMIDE 10 MG/ML (PF) SYRINGE
PREFILLED_SYRINGE | INTRAVENOUS | Status: DC | PRN
  Administered 2021-05-09: 20 mg via INTRAVENOUS
  Administered 2021-05-09: 80 mg via INTRAVENOUS
  Administered 2021-05-09 (×2): 20 mg via INTRAVENOUS

## 2021-05-09 MED ORDER — DIPHENHYDRAMINE HCL 50 MG/ML IJ SOLN: 12.5000 mg | Freq: Four times a day (QID) | INTRAMUSCULAR | Status: DC | PRN

## 2021-05-09 MED ORDER — HYDROMORPHONE HCL 1 MG/ML IJ SOLN: 0.5000 mg | INTRAMUSCULAR | Status: DC | PRN

## 2021-05-09 MED ORDER — METHOCARBAMOL 750 MG PO TABS: 750.0000 mg | ORAL_TABLET | Freq: Four times a day (QID) | ORAL | 2 refills | Status: DC | PRN

## 2021-05-09 MED ORDER — OXYCODONE HCL 5 MG PO TABS
ORAL_TABLET | ORAL | Status: AC
  Filled 2021-05-09: qty 1

## 2021-05-09 MED ORDER — BUPIVACAINE-EPINEPHRINE 0.25% -1:200000 IJ SOLN
INTRAMUSCULAR | Status: DC | PRN
  Administered 2021-05-09: 60 mL

## 2021-05-09 MED ORDER — ENSURE PRE-SURGERY PO LIQD
592.0000 mL | Freq: Once | ORAL | Status: DC
  Filled 2021-05-09: qty 592

## 2021-05-09 MED ORDER — GABAPENTIN 300 MG PO CAPS
300.0000 mg | ORAL_CAPSULE | ORAL | Status: AC
  Administered 2021-05-09: 300 mg via ORAL
  Filled 2021-05-09: qty 1

## 2021-05-09 MED ORDER — ORAL CARE MOUTH RINSE: 15.0000 mL | Freq: Once | OROMUCOSAL | Status: AC

## 2021-05-09 MED ORDER — BUPIVACAINE LIPOSOME 1.3 % IJ SUSP
INTRAMUSCULAR | Status: AC
  Filled 2021-05-09: qty 20

## 2021-05-09 MED ORDER — CEFAZOLIN SODIUM-DEXTROSE 2-4 GM/100ML-% IV SOLN
2.0000 g | Freq: Three times a day (TID) | INTRAVENOUS | Status: AC
  Administered 2021-05-09: 2 g via INTRAVENOUS
  Filled 2021-05-09: qty 100

## 2021-05-09 MED ORDER — CHLORHEXIDINE GLUCONATE 0.12 % MT SOLN
15.0000 mL | Freq: Once | OROMUCOSAL | Status: AC
  Administered 2021-05-09: 15 mL via OROMUCOSAL

## 2021-05-09 MED ORDER — SODIUM CHLORIDE 0.9 % IV SOLN: 250.0000 mL | INTRAVENOUS | Status: DC | PRN

## 2021-05-09 MED ORDER — MIDAZOLAM HCL 5 MG/5ML IJ SOLN
INTRAMUSCULAR | Status: DC | PRN
  Administered 2021-05-09: 2 mg via INTRAVENOUS

## 2021-05-09 MED ORDER — TRAMADOL HCL 50 MG PO TABS: 50.0000 mg | ORAL_TABLET | Freq: Four times a day (QID) | ORAL | Status: DC | PRN

## 2021-05-09 MED ORDER — LIDOCAINE 2% (20 MG/ML) 5 ML SYRINGE
INTRAMUSCULAR | Status: DC | PRN
  Administered 2021-05-09: 60 mg via INTRAVENOUS

## 2021-05-09 MED ORDER — METOPROLOL TARTRATE 5 MG/5ML IV SOLN: 5.0000 mg | Freq: Four times a day (QID) | INTRAVENOUS | Status: DC | PRN

## 2021-05-09 MED ORDER — SUGAMMADEX SODIUM 500 MG/5ML IV SOLN
INTRAVENOUS | Status: AC
  Filled 2021-05-09: qty 5

## 2021-05-09 MED ORDER — BUPIVACAINE LIPOSOME 1.3 % IJ SUSP
INTRAMUSCULAR | Status: DC | PRN
  Administered 2021-05-09: 20 mL

## 2021-05-09 MED ORDER — ACETAMINOPHEN 500 MG PO TABS
1000.0000 mg | ORAL_TABLET | Freq: Four times a day (QID) | ORAL | Status: DC
  Administered 2021-05-09 – 2021-05-10 (×2): 1000 mg via ORAL
  Filled 2021-05-09 (×2): qty 2

## 2021-05-09 MED ORDER — ADULT MULTIVITAMIN W/MINERALS CH
1.0000 | ORAL_TABLET | Freq: Every day | ORAL | Status: DC
  Administered 2021-05-09 – 2021-05-10 (×2): 1 via ORAL
  Filled 2021-05-09 (×2): qty 1

## 2021-05-09 MED ORDER — ACETAMINOPHEN 500 MG PO TABS
1000.0000 mg | ORAL_TABLET | ORAL | Status: AC
  Administered 2021-05-09: 1000 mg via ORAL
  Filled 2021-05-09: qty 2

## 2021-05-09 MED ORDER — ONDANSETRON HCL 4 MG/2ML IJ SOLN
INTRAMUSCULAR | Status: AC
  Filled 2021-05-09: qty 2

## 2021-05-09 MED ORDER — BUPIVACAINE LIPOSOME 1.3 % IJ SUSP: 20.0000 mL | Freq: Once | INTRAMUSCULAR | Status: DC

## 2021-05-09 MED ORDER — HYDROMORPHONE HCL 1 MG/ML IJ SOLN
0.2500 mg | INTRAMUSCULAR | Status: DC | PRN
  Administered 2021-05-09 (×2): 0.5 mg via INTRAVENOUS

## 2021-05-09 MED ORDER — ONDANSETRON HCL 4 MG/2ML IJ SOLN: 4.0000 mg | Freq: Once | INTRAMUSCULAR | Status: DC | PRN

## 2021-05-09 MED ORDER — GABAPENTIN 300 MG PO CAPS
300.0000 mg | ORAL_CAPSULE | Freq: Two times a day (BID) | ORAL | Status: DC
  Administered 2021-05-09: 300 mg via ORAL
  Filled 2021-05-09: qty 1

## 2021-05-09 MED ORDER — OXYCODONE HCL 5 MG PO TABS: 5.0000 mg | ORAL_TABLET | ORAL | Status: DC | PRN

## 2021-05-09 MED ORDER — LIDOCAINE HCL (PF) 2 % IJ SOLN
INTRAMUSCULAR | Status: AC
  Filled 2021-05-09: qty 5

## 2021-05-09 MED ORDER — CEFAZOLIN SODIUM-DEXTROSE 2-4 GM/100ML-% IV SOLN
2.0000 g | INTRAVENOUS | Status: AC
  Administered 2021-05-09: 2 g via INTRAVENOUS
  Filled 2021-05-09: qty 100

## 2021-05-09 MED ORDER — AMLODIPINE BESYLATE 10 MG PO TABS
10.0000 mg | ORAL_TABLET | Freq: Every day | ORAL | Status: DC
  Administered 2021-05-10: 10 mg via ORAL
  Filled 2021-05-09: qty 1

## 2021-05-09 MED ORDER — TRAMADOL HCL 50 MG PO TABS: 50.0000 mg | ORAL_TABLET | Freq: Four times a day (QID) | ORAL | 0 refills | Status: DC | PRN

## 2021-05-09 MED ORDER — ROSUVASTATIN CALCIUM 20 MG PO TABS
40.0000 mg | ORAL_TABLET | Freq: Every day | ORAL | Status: DC
  Administered 2021-05-10: 40 mg via ORAL
  Filled 2021-05-09: qty 2

## 2021-05-09 MED ORDER — ENOXAPARIN SODIUM 40 MG/0.4ML IJ SOSY
40.0000 mg | PREFILLED_SYRINGE | INTRAMUSCULAR | Status: DC
  Administered 2021-05-10: 40 mg via SUBCUTANEOUS
  Filled 2021-05-09: qty 0.4

## 2021-05-09 MED ORDER — MIDAZOLAM HCL 2 MG/2ML IJ SOLN
INTRAMUSCULAR | Status: AC
  Filled 2021-05-09: qty 2

## 2021-05-09 MED ORDER — ONDANSETRON HCL 4 MG/2ML IJ SOLN
INTRAMUSCULAR | Status: DC | PRN
  Administered 2021-05-09: 4 mg via INTRAVENOUS

## 2021-05-09 MED ORDER — ONDANSETRON HCL 4 MG/2ML IJ SOLN: 4.0000 mg | Freq: Four times a day (QID) | INTRAMUSCULAR | Status: DC | PRN

## 2021-05-09 MED ORDER — ROCURONIUM BROMIDE 10 MG/ML (PF) SYRINGE
PREFILLED_SYRINGE | INTRAVENOUS | Status: AC
  Filled 2021-05-09: qty 10

## 2021-05-09 MED ORDER — SUGAMMADEX SODIUM 500 MG/5ML IV SOLN
INTRAVENOUS | Status: DC | PRN
  Administered 2021-05-09: 400 mg via INTRAVENOUS

## 2021-05-09 MED ORDER — PROCHLORPERAZINE EDISYLATE 10 MG/2ML IJ SOLN: 5.0000 mg | Freq: Four times a day (QID) | INTRAMUSCULAR | Status: DC | PRN

## 2021-05-09 MED ORDER — PROPOFOL 10 MG/ML IV BOLUS
INTRAVENOUS | Status: AC
  Filled 2021-05-09: qty 20

## 2021-05-09 MED ORDER — METOPROLOL SUCCINATE ER 50 MG PO TB24
200.0000 mg | ORAL_TABLET | Freq: Every day | ORAL | Status: DC
  Administered 2021-05-10: 200 mg via ORAL
  Filled 2021-05-09: qty 4

## 2021-05-09 MED ORDER — SODIUM CHLORIDE 0.9% FLUSH: 3.0000 mL | INTRAVENOUS | Status: DC | PRN

## 2021-05-09 MED ORDER — METHOCARBAMOL 1000 MG/10ML IJ SOLN
1000.0000 mg | Freq: Four times a day (QID) | INTRAVENOUS | Status: DC | PRN
  Filled 2021-05-09: qty 10

## 2021-05-09 MED ORDER — PANTOPRAZOLE SODIUM 40 MG PO TBEC
40.0000 mg | DELAYED_RELEASE_TABLET | Freq: Every day | ORAL | Status: DC
  Administered 2021-05-10: 40 mg via ORAL
  Filled 2021-05-09: qty 1

## 2021-05-09 MED ORDER — FENTANYL CITRATE (PF) 100 MCG/2ML IJ SOLN
INTRAMUSCULAR | Status: DC | PRN
  Administered 2021-05-09 (×2): 50 ug via INTRAVENOUS

## 2021-05-09 MED ORDER — OXYCODONE HCL 5 MG/5ML PO SOLN: 5.0000 mg | Freq: Once | ORAL | Status: AC | PRN

## 2021-05-09 MED ORDER — SIMETHICONE 80 MG PO CHEW: 40.0000 mg | CHEWABLE_TABLET | Freq: Four times a day (QID) | ORAL | Status: DC | PRN

## 2021-05-09 MED ORDER — FENTANYL CITRATE (PF) 100 MCG/2ML IJ SOLN
INTRAMUSCULAR | Status: AC
  Filled 2021-05-09: qty 2

## 2021-05-09 MED ORDER — ONDANSETRON 4 MG PO TBDP: 4.0000 mg | ORAL_TABLET | Freq: Four times a day (QID) | ORAL | Status: DC | PRN

## 2021-05-09 MED ORDER — PROPOFOL 10 MG/ML IV BOLUS
INTRAVENOUS | Status: DC | PRN
  Administered 2021-05-09: 150 mg via INTRAVENOUS

## 2021-05-09 MED ORDER — HYDROMORPHONE HCL 1 MG/ML IJ SOLN
INTRAMUSCULAR | Status: AC
  Filled 2021-05-09: qty 2

## 2021-05-09 MED ORDER — DEXAMETHASONE SODIUM PHOSPHATE 10 MG/ML IJ SOLN
INTRAMUSCULAR | Status: AC
  Filled 2021-05-09: qty 1

## 2021-05-09 MED ORDER — LACTATED RINGERS IV BOLUS: 1000.0000 mL | Freq: Three times a day (TID) | INTRAVENOUS | Status: DC | PRN

## 2021-05-09 MED ORDER — MAGNESIUM HYDROXIDE 400 MG/5ML PO SUSP: 30.0000 mL | Freq: Every day | ORAL | Status: DC | PRN

## 2021-05-09 MED ORDER — LACTATED RINGERS IV SOLN: INTRAVENOUS | Status: AC

## 2021-05-09 MED ORDER — DEXAMETHASONE SODIUM PHOSPHATE 10 MG/ML IJ SOLN
INTRAMUSCULAR | Status: DC | PRN
  Administered 2021-05-09: 10 mg via INTRAVENOUS

## 2021-05-09 SURGICAL SUPPLY — 44 items
APPLIER CLIP 5 13 M/L LIGAMAX5 (MISCELLANEOUS)
BAG COUNTER SPONGE SURGICOUNT (BAG) ×2 IMPLANT
BINDER ABDOMINAL 12 ML 46-62 (SOFTGOODS) ×1 IMPLANT
CABLE HIGH FREQUENCY MONO STRZ (ELECTRODE) ×2 IMPLANT
CHLORAPREP W/TINT 26 (MISCELLANEOUS) ×2 IMPLANT
CLIP APPLIE 5 13 M/L LIGAMAX5 (MISCELLANEOUS) IMPLANT
COVER SURGICAL LIGHT HANDLE (MISCELLANEOUS) ×2 IMPLANT
DEVICE SECURE STRAP 25 ABSORB (INSTRUMENTS) ×1 IMPLANT
DEVICE TROCAR PUNCTURE CLOSURE (ENDOMECHANICALS) ×2 IMPLANT
DRAPE WARM FLUID 44X44 (DRAPES) ×2 IMPLANT
DRSG TEGADERM 2-3/8X2-3/4 SM (GAUZE/BANDAGES/DRESSINGS) ×7 IMPLANT
DRSG TEGADERM 4X4.75 (GAUZE/BANDAGES/DRESSINGS) ×2 IMPLANT
ELECT REM PT RETURN 15FT ADLT (MISCELLANEOUS) ×2 IMPLANT
GAUZE SPONGE 2X2 8PLY STRL LF (GAUZE/BANDAGES/DRESSINGS) IMPLANT
GLOVE SURG NEOPR MICRO LF SZ8 (GLOVE) ×2 IMPLANT
GLOVE SURG UNDER LTX SZ8 (GLOVE) ×2 IMPLANT
GOWN STRL REUS W/TWL XL LVL3 (GOWN DISPOSABLE) ×4 IMPLANT
IRRIG SUCT STRYKERFLOW 2 WTIP (MISCELLANEOUS)
IRRIGATION SUCT STRKRFLW 2 WTP (MISCELLANEOUS) IMPLANT
KIT BASIN OR (CUSTOM PROCEDURE TRAY) ×2 IMPLANT
KIT TURNOVER KIT A (KITS) IMPLANT
MARKER SKIN DUAL TIP RULER LAB (MISCELLANEOUS) ×2 IMPLANT
MESH VENTRALIGHT ST 10X13IN (Mesh General) ×1 IMPLANT
NDL INSUFFLATION 14GA 120MM (NEEDLE) IMPLANT
NDL SPNL 22GX3.5 QUINCKE BK (NEEDLE) IMPLANT
NEEDLE INSUFFLATION 14GA 120MM (NEEDLE) ×2 IMPLANT
NEEDLE SPNL 22GX3.5 QUINCKE BK (NEEDLE) IMPLANT
NS IRRIG 1000ML POUR BTL (IV SOLUTION) ×2 IMPLANT
PAD POSITIONING PINK XL (MISCELLANEOUS) ×2 IMPLANT
PENCIL SMOKE EVACUATOR (MISCELLANEOUS) IMPLANT
SCISSORS LAP 5X35 DISP (ENDOMECHANICALS) ×2 IMPLANT
SET TUBE SMOKE EVAC HIGH FLOW (TUBING) ×2 IMPLANT
SLEEVE ADV FIXATION 5X100MM (TROCAR) ×2 IMPLANT
SPIKE FLUID TRANSFER (MISCELLANEOUS) ×2 IMPLANT
SPONGE GAUZE 2X2 STER 10/PKG (GAUZE/BANDAGES/DRESSINGS) ×1
STRIP CLOSURE SKIN 1/2X4 (GAUZE/BANDAGES/DRESSINGS) ×4 IMPLANT
SUT MNCRL AB 4-0 PS2 18 (SUTURE) ×3 IMPLANT
SUT PDS AB 1 CT1 27 (SUTURE) ×6 IMPLANT
SUT PROLENE 1 CT 1 30 (SUTURE) ×13 IMPLANT
TOWEL OR 17X26 10 PK STRL BLUE (TOWEL DISPOSABLE) ×2 IMPLANT
TRAY LAPAROSCOPIC (CUSTOM PROCEDURE TRAY) ×2 IMPLANT
TROCAR ADV FIXATION 11X100MM (TROCAR) IMPLANT
TROCAR ADV FIXATION 5X100MM (TROCAR) ×2 IMPLANT
TROCAR BLADELESS OPT 5 100 (ENDOMECHANICALS) ×2 IMPLANT

## 2021-05-09 NOTE — H&P (Signed)
05/09/2021     REFERRING PHYSICIAN: Self  Patient Care Team: Harrold Donath, MD as PCP - General (Internal Medicine) Wellington Hampshire, MD (Cardiovascular Disease)  PROVIDER: Hollace Kinnier, MD  [facility] MRN: [id number] DOB: [birth year] DATE OF ENCOUNTER: 03/18/2021  SUBJECTIVE   Chief Complaint: Incisional hernia   History of Present Illness: [name] [name] is a 61 y.o. male who is seen today  as an office consultation at the request for another opinion concerning incisional hernia.   Pleasant patient originally from [place] who noticed bulging around his bellybutton concerning for hernia. Had had no prior abdominal surgery. Patient was consulted by Dr. Ferrel Logan and underwent attempted robotic incisional hernia repair. Unfortunately had colon injury on trocar placement that required conversion to open with open primary repair 08/11/2019. Developed hernia recurrence. Had followed up with Dr. Christian Mate who had discussed repeat hernia repair attempt versus observation. I believe this was delayed given the diagnosis of a renal cell mass very suspicious for carcinoma. Treated by interventional radiology with cryoablation May 2022. Follow-up imaging in October 2022 noted good ablation with no evidence of any residual disease no recurrence. There is discussion about considering hernia repair by Dr. Christian Mate. Patient wished a second opinion since he was concerned the hernias were starting to bother him more so our group was consulted.  He does have a history of coronary artery disease as well as some emphysema both incidentally noted. Has a history of peripheral arterial disease. Is undergone numerous interventions with ballooning. Lithotripsy and cystoscopies for kidney stones as well. He is anticoagulated on Plavix & ASA. There are some distant history of having a decreased ejection fraction over a decade ago but 2013 had normal ejection fraction. He has not seen a cardiologist in  10 years. He is quit smoking since 2015. He believes he has borderline prediabetes but not on any medication. Was diagnosed with some lung emphysema on x-ray. Does not think he has sleep apnea. Does have some heartburn and reflux.  Medical History:  Past Medical History:  Diagnosis Date   Anemia  resolved 01/2016   Arthritis 2016   Emphysema lung (CMS-HCC)   GERD (gastroesophageal reflux disease)   Gout   History of blood transfusion 2017   History of cancer   Hyperlipidemia   Hypertension   Kidney failure  resolved in 10/2015   PVD (peripheral vascular disease) (CMS-HCC)   Thyroid disease 2018   Tobacco abuse   Patient Active Problem List  Diagnosis   PVD (peripheral vascular disease) (CMS-HCC)   Benign hypertension with chronic kidney disease, stage III (CMS-HCC)   Hypertriglyceridemia   History of tobacco use   Multinodular goiter   Pernicious anemia   Health care maintenance   Anemia   Chronic gouty arthritis   Ataxia   Diplopia   Hepatic steatosis   Prediabetes   Emphysema lung (CMS-HCC)   Coronary artery disease involving native coronary artery   Past Surgical History:  Procedure Laterality Date   COLONOSCOPY 07/24/2010   EGD 07/24/2010   HERNIA REPAIR   REPAIR VENTRAL HERNIA LAPAROSCOPIC 08/11/2019  Dr Lesli Albee -- [place] 2014-2016    No Known Allergies  Current Outpatient Medications on File Prior to Visit  Medication Sig Dispense Refill   amLODIPine (NORVASC) 10 MG tablet TAKE 1 TABLET BY MOUTH ONCE DAILY 90 tablet 3   aspirin 81 MG EC tablet Take 81 mg by mouth once daily.   clopidogreL (PLAVIX) 75 mg tablet TAKE  1 TABLET BY MOUTH ONCE DAILY 90 tablet 3   metoprolol succinate (TOPROL-XL) 200 MG XL tablet TAKE 1 TABLET BY MOUTH DAILY 90 tablet 3   multivitamin tablet Take 1 tablet by mouth once daily.   omega-3 acid ethyl esters (LOVAZA) 1 gram capsule TAKE 4 CAPSULES BY MOUTH DAILY 360 capsule 3   pantoprazole (PROTONIX)  40 MG DR tablet TAKE 1 TABLET BY MOUTH DAILY 90 tablet 3   rosuvastatin (CRESTOR) 40 MG tablet TAKE 1 TABLET BY MOUTH ONCE DAILY 90 tablet 3   No current facility-administered medications on file prior to visit.   Family History  Problem Relation Age of Onset   Heart failure Mother   Lymphoma Maternal Grandmother   Cancer Maternal Grandmother   No Known Problems Father   No Known Problems Maternal Grandfather   No Known Problems Paternal Grandmother   No Known Problems Paternal Grandfather    Social History   Tobacco Use  Smoking Status Former   Packs/day: 1.00   Years: 1.00   Pack years: 1.00   Types: Cigarettes   Start date: 12/05/1976   Quit date: 04/2013   Years since quitting: 7.9  Smokeless Tobacco Never    Social History   Socioeconomic History   Marital status: Married  Tobacco Use   Smoking status: Former  Packs/day: 1.00  Years: 1.00  Pack years: 1.00  Types: Cigarettes  Start date: 12/05/1976  Quit date: 04/2013  Years since quitting: 7.9   Smokeless tobacco: Never  Vaping Use   Vaping Use: Never used  Substance and Sexual Activity   Alcohol use: Yes  Alcohol/week: 14.0 standard drinks  Types: 14 Glasses of wine per week   Drug use: No   Sexual activity: Yes  Partners: Female  Birth control/protection: None  Social History Narrative  Originally from [place]   ############################################################  Review of Systems: A complete review of systems (ROS) was obtained from the patient. I have reviewed this information and discussed as appropriate with the patient. See HPI as well for other pertinent ROS.  Constitutional: No fevers, chills, sweats. Weight stable Eyes: No vision changes, No discharge HENT: No sore throats, nasal drainage Lymph: No neck swelling, No bruising easily Pulmonary: No cough, productive sputum CV: No orthopnea, PND Patient walks 10-15 minutes without difficulty. No exertional chest/neck/shoulder/arm  pain.  GI: No personal nor family history of GI/colon cancer, inflammatory bowel disease, irritable bowel syndrome, allergy such as Celiac Sprue, dietary/dairy problems, colitis, ulcers nor gastritis. No recent sick contacts/gastroenteritis. No travel outside the country. No changes in diet.  Renal: No UTIs, No hematuria Genital: No drainage, bleeding, masses Musculoskeletal: No severe joint pain. Good ROM major joints Skin: No sores or lesions Heme/Lymph: No easy bleeding. No swollen lymph nodes  OBJECTIVE   Vitals:  03/18/21 1036  BP: 120/68  Pulse: 75  Weight: 91.4 kg (201 lb 9.6 oz)  Height: 182.9 cm (6')    Body mass index is 27.34 kg/m.  PHYSICAL EXAM:  Constitutional: Not cachectic. Hygeine adequate. Vitals signs as above.  Eyes: Pupils reactive, normal extraocular movements. Sclera nonicteric Neuro: CN II-XII intact. No major focal sensory defects. No major motor deficits. Lymph: No head/neck/groin lymphadenopathy Psych: No severe agitation. No severe anxiety. Judgment & insight Adequate, Oriented x4, HENT: Normocephalic, Mucus membranes moist. No thrush.  Neck: Supple, No tracheal deviation. No obvious thyromegaly Chest: No pain to chest wall compression. Good respiratory excursion. No audible wheezing CV: Pulses intact. Regular rhythm. No major extremity edema Ext:  No obvious deformity or contracture. Edema: Not present. No cyanosis Skin: No major subcutaneous nodules. Warm and dry Musculoskeletal: Severe joint rigidity not present. No obvious clubbing. No digital petechiae.   Abdomen:  Obese Hernia: Upper midline incision with large diastases. 11 x 4 cm region of some Swiss cheese hernias. Some incarcerated. Diastasis recti: Large supraumbilical midline.  Soft. Nondistended. Nontender. No hepatomegaly. No splenomegaly  Genital/Pelvic: Inguinal hernia: Not present. Inguinal lymph nodes: without lymphadenopathy.   Rectal:  (Deferred)    ###################################################################  Labs, Imaging and Diagnostic Testing:  Located in 'Care Everywhere' section of Epic EMR chart  PRIOR [facility] CLINIC NOTES:  Not applicable  SURGERY NOTES:  Located in [place]' section of Epic EMR chart  PATHOLOGY:  Not applicable  Assessment and Plan:  DIAGNOSES:  Diagnoses and all orders for this visit:  Recurrent incisional hernia with incarceration  Diastasis recti  History of renal cell cancer  PAD (peripheral artery disease) (CMS-HCC)  Chronic anticoagulation  Overweight    ASSESSMENT/PLAN  Pleasant gentleman with significant diastases recti and protuberant abdomen with evidence of Swiss cheese type incisional hernias 11 x 4 cm region. Largest periumbilical with colon noted within it on CT scan. Some of the hernias are incarcerated. Recurrent from attempted robotic repair with colon injury requiring conversion to open with primary repair 2021  Given the fact he has some incarcerated hernias and colon within one of them with increasing size and symptoms, I think it is reasonable to consider repair. Reasonable to do a laparoscopically minimal invasive approach with lysis of adhesions. Low threshold to keep overnight to be safe. Suspect he will need to larger sheet of mesh given the significant region in his obesity and his large diastases recti. I do not think he has a massive solitary defect that would require open component separation repair. Because there is colon within it and he is concerned about it getting bigger, I think it is reasonable to try repair.  The anatomy & physiology of the abdominal wall was discussed. The pathophysiology of hernias was discussed. Natural history risks without surgery including progeressive enlargement, pain, incarceration, & strangulation was discussed. Contributors to complications such as smoking, obesity, diabetes, prior surgery, etc were  discussed.   I feel the risks of no intervention will lead to serious problems that outweigh the operative risks; therefore, I recommended surgery to reduce and repair the hernia. I explained laparoscopic techniques with possible need for an open approach. I noted the probable use of mesh to patch and/or buttress the hernia repair  Risks such as bleeding, infection, abscess, need for further treatment, injury to other organs, need for repair of tissues / organs, stroke, heart attack, death, and other risks were discussed. I noted a good likelihood this will help address the problem. Goals of post-operative recovery were discussed as well. Possibility that this will not correct all symptoms was explained. I stressed the importance of low-impact activity, aggressive pain control, avoiding constipation, & not pushing through pain to minimize risk of post-operative chronic pain or injury. Possibility of reherniation especially with smoking, obesity, diabetes, immunosuppression, and other health conditions was discussed. We will work to minimize complications.   An educational handout further explaining the pathology & treatment options was given as well. Questions were answered. The patient expresses understanding & wishes to proceed with surgery.  He does seem to have had some peripheral vascular disease and is double anticoagulated.  Felt by referring physician okay to proceed with surgery carefully  Adin Hector, MD, FACS, MASCRS Esophageal, Gastrointestinal & Colorectal Surgery Robotic and Minimally Invasive Surgery  [facility], [place]  [place]. [address], [place], [place] [address] [phone] Fax [phone] Main  CONTACT INFORMATION:  Weekday (9AM-5PM): Call [facility] main office at [phone]  Weeknight (5PM-9AM) or Weekend/Holiday: Check [email or web address] (password " [email or web address]") for General Surgery [facility] coverage  (Please, do  not use SecureChat as it is not reliable communication to operating surgeons for immediate patient care)    05/09/2021

## 2021-05-09 NOTE — Anesthesia Preprocedure Evaluation (Addendum)
Anesthesia Evaluation  Patient identified by MRN, date of birth, ID band Patient awake    Reviewed: Allergy & Precautions, NPO status , Patient's Chart, lab work & pertinent test results, Unable to perform ROS - Chart review only  History of Anesthesia Complications Negative for: history of anesthetic complications  Airway Mallampati: II  TM Distance: >3 FB Neck ROM: Full    Dental  (+) Dental Advisory Given, Missing, Poor Dentition   Pulmonary shortness of breath and with exertion, COPD, former smoker,    Pulmonary exam normal breath sounds clear to auscultation       Cardiovascular Exercise Tolerance: Good hypertension (149/84 in preop), Pt. on home beta blockers and Pt. on medications (-) angina+ CAD and + Peripheral Vascular Disease (last dose 05/04/21)  Normal cardiovascular exam+ dysrhythmias  Rhythm:Regular Rate:Normal     Neuro/Psych  Neuromuscular disease negative psych ROS   GI/Hepatic Neg liver ROS, GERD  Medicated and Controlled,  Endo/Other  negative endocrine ROS  Renal/GU Renal disease  negative genitourinary   Musculoskeletal  (+) Arthritis ,   Abdominal   Peds  Hematology  (+) Blood dyscrasia, anemia ,   Anesthesia Other Findings Past Medical History: No date: Anemia     Comment:  pernicious anemia No date: Chest pain     Comment:  Unspecified 12/26/2015: Chronic gouty arthritis No date: Chronic kidney disease No date: Dysrhythmia     Comment:  pac's.  okay now. No date: GERD (gastroesophageal reflux disease) No date: History of kidney stones No date: Hypertension No date: Hypertriglyceridemia No date: Kidney stone on left side     Comment:  2018 No date: Palpitations 2019: Peripheral vascular disease ([place]) No date: Pre-diabetes No date: Renal insufficiency No date: Renal mass 2019: Thyroid nodule     Comment:  being monitored  Past Surgical History: 03/23/2017: CYSTOSCOPY W/  RETROGRADES; Bilateral     Comment:  Procedure: CYSTOSCOPY WITH RETROGRADE PYELOGRAM;                Surgeon: Abbie Sons, MD;  Location: [facility] ORS;                Service: Urology;  Laterality: Bilateral; 03/31/2017: CYSTOSCOPY W/ URETERAL STENT PLACEMENT; Left     Comment:  Procedure: CYSTOSCOPY WITH STENT REPLACEMENT;  Surgeon:               Abbie Sons, MD;  Location: [facility];  Service:               Urology;  Laterality: Left; 03/31/2017: CYSTOSCOPY/RETROGRADE/URETEROSCOPY/STONE EXTRACTION WITH  BASKET; Left     Comment:  Procedure: CYSTOSCOPY/RETROGRADE/URETEROSCOPY/STONE               EXTRACTION WITH BASKET;  Surgeon: Abbie Sons, MD;                Location: [facility];  Service: Urology;  Laterality: Left; 03/23/2017: CYSTOSCOPY/URETEROSCOPY/HOLMIUM LASER/STENT PLACEMENT;  Left     Comment:  Procedure: CYSTOSCOPY/URETEROSCOPY/HOLMIUM LASER/STENT               PLACEMENT;  Surgeon: Abbie Sons, MD;  Location:               [facility];  Service: Urology;  Laterality: Left; 10/22/2015: ESOPHAGOGASTRODUODENOSCOPY (EGD) WITH PROPOFOL; N/A     Comment:  Procedure: ESOPHAGOGASTRODUODENOSCOPY (EGD) WITH               PROPOFOL;  Surgeon: Lucilla Lame, MD;  Location: [facility]  ENDOSCOPY;  Service: Endoscopy;  Laterality: N/A; 2016: EXTRACORPOREAL SHOCK WAVE LITHOTRIPSY 09/13/2014: EXTRACORPOREAL SHOCK WAVE LITHOTRIPSY; Left     Comment:  Procedure: EXTRACORPOREAL SHOCK WAVE LITHOTRIPSY (ESWL);              Surgeon: Hollice Espy, MD;  Location: [facility] ORS;                Service: Urology;  Laterality: Left; No date: HERNIA REPAIR 08/01/2020: IR RADIOLOGIST EVAL & MGMT 09/17/2020: IR RADIOLOGIST EVAL & MGMT 01/21/2021: IR RADIOLOGIST EVAL & MGMT 07/15/2017: LOWER EXTREMITY ANGIOGRAPHY; Left     Comment:  Procedure: LOWER EXTREMITY ANGIOGRAPHY;  Surgeon: Algernon Huxley, MD;  Location: [place] [facility];  Service:               Cardiovascular;  Laterality:  Left; No date: lower extremity interventions x2 right leg; Right     Comment:  stent 08/20/2014: PERIPHERAL VASCULAR CATHETERIZATION; Right     Comment:  Procedure: Lower Extremity Angiography;  Surgeon: Algernon Huxley, MD;  Location: [facility];  Service:               Cardiovascular;  Laterality: Right; 08/20/2014: PERIPHERAL VASCULAR CATHETERIZATION; Right     Comment:  Procedure: Lower Extremity Intervention;  Surgeon: Algernon Huxley, MD;  Location: [place] [facility];  Service:               Cardiovascular;  Laterality: Right; 08/28/2020: RADIOLOGY WITH ANESTHESIA; N/A     Comment:  Procedure: RADIOLOGY WITH ANESTHESIA CT WITH               CRYOABLATION;  Surgeon: Greggory Keen, MD;  Location: [facility]              ORS;  Service: Anesthesiology;  Laterality: N/A; 08/11/2019: VENTRAL HERNIA REPAIR     Comment:  Procedure: HERNIA REPAIR VENTRAL ADULT WITH REPAIR OF               LARGE INTESTINE LACERATION;  Surgeon: Herbert Pun, MD;  Location: [facility] ORS;  Service: General;; 08/11/2019: XI ROBOTIC ASSISTED VENTRAL HERNIA; N/A     Comment:  Procedure: XI ROBOTIC ASSISTED VENTRAL HERNIA;  Surgeon:              Herbert Pun, MD;  Location: [facility] ORS;  Service:              General;  Laterality: N/A;  Converted to open procedure  BMI    Body Mass Index: 26.37 kg/m      Reproductive/Obstetrics negative OB ROS                           Anesthesia Physical  Anesthesia Plan  ASA: 3  Anesthesia Plan: General   Post-op Pain Management: Minimal or no pain anticipated and Tylenol PO (pre-op)   Induction: Intravenous  PONV Risk Score and Plan: Treatment may vary due to age or medical condition, Ondansetron and Dexamethasone  Airway Management Planned: Oral ETT  Additional Equipment:   Intra-op Plan:   Post-operative Plan: Extubation in OR  Informed Consent: I have reviewed the patients History  and  Physical, chart, labs and discussed the procedure including the risks, benefits and alternatives for the proposed anesthesia with the patient or authorized representative who has indicated his/her understanding and acceptance.     Dental advisory given  Plan Discussed with: CRNA  Anesthesia Plan Comments: (2 x PIV,)       Anesthesia Quick Evaluation

## 2021-05-09 NOTE — Discharge Instructions (Signed)
HERNIA REPAIR: POST OP INSTRUCTIONS  ######################################################################  EAT Gradually transition to a high fiber diet with a fiber supplement over the next few weeks after discharge.  Start with a pureed / full liquid diet (see below)  WALK Walk an hour a day.  Control your pain to do that.    CONTROL PAIN Control pain so that you can walk, sleep, tolerate sneezing/coughing, and go up/down stairs.  HAVE A BOWEL MOVEMENT DAILY Keep your bowels regular to avoid problems.  OK to try a laxative to override constipation.  OK to use an antidairrheal to slow down diarrhea.  Call if not better after 2 tries  CALL IF YOU HAVE PROBLEMS/CONCERNS Call if you are still struggling despite following these instructions. Call if you have concerns not answered by these instructions  ######################################################################    DIET: Follow a light bland diet & liquids the first 24 hours after arrival home, such as soup, liquids, starches, etc.  Be sure to drink plenty of fluids.  Quickly advance to a usual solid diet within a few days.  Avoid fast food or heavy meals as your are more likely to get nauseated or have irregular bowels.  A low-fat, high-fiber diet for the rest of your life is ideal.   Take your usually prescribed home medications unless otherwise directed.  PAIN CONTROL: Pain is best controlled by a usual combination of three different methods TOGETHER: Ice/Heat Over the counter pain medication Prescription pain medication Most patients will experience some swelling and bruising around the hernia(s) such as the bellybutton, groins, or old incisions.  Ice packs or heating pads (30-60 minutes up to 6 times a day) will help. Use ice for the first few days to help decrease swelling and bruising, then switch to heat to help relax tight/sore spots and speed recovery.  Some people prefer to use ice alone, heat alone, alternating  between ice & heat.  Experiment to what works for you.  Swelling and bruising can take several weeks to resolve.   It is helpful to take an over-the-counter pain medication regularly for the first few weeks.  Choose one of the following that works best for you: Naproxen (Aleve, etc)  Two 220mg tabs twice a day Ibuprofen (Advil, etc) Three 200mg tabs four times a day (every meal & bedtime) Acetaminophen (Tylenol, etc) 325-650mg four times a day (every meal & bedtime) A  prescription for pain medication should be given to you upon discharge.  Take your pain medication as prescribed.  If you are having problems/concerns with the prescription medicine (does not control pain, nausea, vomiting, rash, itching, etc), please call us [phone] to see if we need to switch you to a different pain medicine that will work better for you and/or control your side effect better. If you need a refill on your pain medication, please contact your pharmacy.  They will contact our office to request authorization. Prescriptions will not be filled after 5 pm or on week-ends.  Avoid getting constipated.  Between the surgery and the pain medications, it is common to experience some constipation.  Increasing fluid intake and taking a fiber supplement (such as Metamucil, Citrucel, FiberCon, MiraLax, etc) 1-2 times a day regularly will usually help prevent this problem from occurring.  A mild laxative (prune juice, Milk of Magnesia, MiraLax, etc) should be taken according to package directions if there are no bowel movements after 48 hours.    Wash / shower every day.  You may shower over the dressings   as they are waterproof.    Remove your waterproof bandages, skin tapes, and other bandages 3 days after surgery. It is good for closed incisions and even open wounds to be washed every day.  Shower every day.  Short baths are fine.  Wash the incisions and wounds clean with soap & water.    You may leave closed incisions open to  air if it is dry.   You may cover the incision with clean gauze & replace it after your daily shower for comfort.  TEGADERM & STERISTRIPS:  You have clear gauze band-aid dressings over your closed incision(s).  You also have skin tapes called Steristrips on your incisions.  Leave these in place.  You may trim any edges that curl up with clean scissors.  You may remove all dressings & tapes in the shower in a week.     ACTIVITIES as tolerated:   You may resume regular (light) daily activities beginning the next day--such as daily self-care, walking, climbing stairs--gradually increasing activities as tolerated.  Control your pain so that you can walk an hour a day.  If you can walk 30 minutes without difficulty, it is safe to try more intense activity such as jogging, treadmill, bicycling, low-impact aerobics, swimming, etc. Save the most intensive and strenuous activity for last such as sit-ups, heavy lifting, contact sports, etc  Refrain from any heavy lifting or straining until you are off narcotics for pain control.   DO NOT PUSH THROUGH PAIN.  Let pain be your guide: If it hurts to do something, don't do it.  Pain is your body warning you to avoid that activity for another week until the pain goes down. You may drive when you are no longer taking prescription pain medication, you can comfortably wear a seatbelt, and you can safely maneuver your car and apply brakes. You may have sexual intercourse when it is comfortable.   FOLLOW UP in our office Please call [facility] at [phone] to set up an appointment to see your surgeon in the office for a follow-up appointment approximately 2-3 weeks after your surgery. Make sure that you call for this appointment the day you arrive home to insure a convenient appointment time.  9.  If you have disability of FMLA / Family leave forms, please bring the forms to the office for processing.  (do not give to your surgeon).  WHEN TO CALL us ([phone])  [phone]: Poor pain control Reactions / problems with new medications (rash/itching, nausea, etc)  Fever over 101.5 F (38.5 C) Inability to urinate Nausea and/or vomiting Worsening swelling or bruising Continued bleeding from incision. Increased pain, redness, or drainage from the incision   The clinic staff is available to answer your questions during regular business hours (8:30am-5pm).  Please dont hesitate to call and ask to speak to one of our nurses for clinical concerns.   If you have a medical emergency, go to the nearest emergency room or call 911.  A surgeon from [facility] is always on call at the hospitals in [facility], [place], [place], [place], [place]  [address] ?  [address] [address], [place], [place]   [address] MAIN: [phone] ? TOLL FREE: [phone] ? FAX: [phone] [email or web address]

## 2021-05-09 NOTE — Anesthesia Postprocedure Evaluation (Signed)
Anesthesia Post Note  Patient: [name] [name]  Procedure(s) Performed: LAPAROSCOPIC VENTRAL HERNIA REPAIR WITH MESH WITH TAP BLOCK LYSIS OF ADHESION     Patient location during evaluation: PACU Anesthesia Type: General Level of consciousness: awake and alert, oriented and patient cooperative Pain management: pain level controlled Vital Signs Assessment: post-procedure vital signs reviewed and stable Respiratory status: spontaneous breathing, nonlabored ventilation and respiratory function stable Cardiovascular status: blood pressure returned to baseline and stable Postop Assessment: no apparent nausea or vomiting Anesthetic complications: no   No notable events documented.  Last Vitals:  Vitals:   05/09/21 1642 05/09/21 1645  BP:  137/83  Pulse: 80 82  Resp: 15 18  Temp:  37 C  SpO2: 94% 94%    Last Pain:  Vitals:   05/09/21 1645  TempSrc:   PainSc: [place]

## 2021-05-09 NOTE — Transfer of Care (Signed)
Immediate Anesthesia Transfer of Care Note  Patient: [name] [name]  Procedure(s) Performed: Procedure(s): LAPAROSCOPIC VENTRAL HERNIA REPAIR WITH MESH WITH TAP BLOCK (N/A) LYSIS OF ADHESION (N/A)  Patient Location: PACU  Anesthesia Type:General  Level of Consciousness: Alert, Awake, Oriented  Airway & Oxygen Therapy: Patient Spontanous Breathing  Post-op Assessment: Report given to RN  Post vital signs: Reviewed and stable  Last Vitals:  Vitals:   05/09/21 1127  BP: (!) 149/84  Pulse: 81  Resp: 18  Temp: 36.8 C  SpO2: 638%    Complications: No apparent anesthesia complications

## 2021-05-09 NOTE — Op Note (Signed)
05/09/2021  PATIENT:  [name] [name]  61 y.o. 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  PROCEDURE:   LAPAROSCOPIC REPAIR OF ABDOMINAL HERNIA WITH MESH  (See OR Findings below)  TAP BLOCK - BILATERAL  SURGEON:  Adin Hector, MD  ASSISTANT: OR team   ANESTHESIA:     General  Regional TRANSVERSUS ABDOMINIS PLANE (TAP) nerve block for perioperative & postoperative pain control provided with liposomal bupivacaine (Experel) mixed with 0.25% bupivacaine as a Bilateral TAP block x 40mL each side at the level of the transverse abdominis & preperitoneal spaces along the flank at the anterior axillary line, from subcostal ridge to iliac crest under laparoscopic guidance   EBL:  Total I/O In: 1500 [I.V.:1400; IV Piggyback:100] Out: 20 [Blood:20]  Per anesthesia record  Delay start of Pharmacological VTE agent (>24hrs) due to surgical blood loss or risk of bleeding:  no  DRAINS: none   SPECIMEN:  No Specimen  DISPOSITION OF SPECIMEN:  N/A  COUNTS:  YES  PLAN OF CARE: Admit for overnight observation  PATIENT DISPOSITION:  PACU - hemodynamically stable.  INDICATION: Pleasant patient has developed a ventral wall abdominal hernia. Recommendation was made for surgical repair  The anatomy & physiology of the abdominal wall was discussed. The pathophysiology of hernias was discussed. Natural history risks without surgery including progeressive enlargement, pain, incarceration & strangulation was discussed. Contributors to complications such as smoking, obesity, diabetes, prior surgery, etc were discussed.  I feel the risks of no intervention will lead  to serious problems that outweigh the operative risks; therefore, I recommended surgery to reduce and repair the hernia. I explained laparoscopic techniques with possible need for an open approach. I noted the probable use of mesh to patch and/or buttress the hernia repair.  Risks such as bleeding, infection, abscess, need for further treatment, heart attack, death, and other risks were discussed. I noted a good likelihood this will help address the problem. Goals of post-operative recovery were discussed as well. Possibility that this will not correct all symptoms was explained. I stressed the importance of low-impact activity, aggressive pain control, avoiding constipation, & not pushing through pain to minimize risk of post-operative chronic pain or injury. Possibility of reherniation especially with smoking, obesity, diabetes, immunosuppression, and other health conditions was discussed. We will work to minimize complications.   An educational handout further explaining the pathology & treatment options was given as well. Questions were answered. The patient expresses understanding & wishes to proceed with surgery.   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  15 x 5 cm region.  Moderate adhesions left upper quadrant consistent with his prior renal surgery and exploratory laparotomy/colon repair  Hernia location: Supraumbilical  Hernia type: Recurrent incisional - Incarcerated  Hernia size - largest dimension 15cm x 5cm  Type of repair: Laparoscopic underlay repair.  Primary repair of largest hernia   Placement of mesh: Centrally intraperitoneal with edges tucked into RECTRORECTUS & preperitoneal space  Name of mesh: Bard Ventralight dual sided (polypropylene / Seprafilm)  Size of mesh: 33x27cm  Orientation: Transverse  Mesh overlap:  5-7cm   DESCRIPTION:   Informed consent was confirmed. The patient  underwent general anaesthesia without  difficulty. The patient was positioned appropriately. VTE prevention in place. The patient's abdomen was clipped, prepped, & draped in a sterile fashion. Surgical timeout confirmed our plan.  The patient was positioned in reverse Trendelenburg. Abdominal entry was gained using optical entry technique in the upper abdomen.  Upon entry I did note some adhesions.  I did gentle camera sweeping.  I decided to entry in the contralateral side and found a more clean pocket.  I induced carbon dioxide insufflation. Camera inspection revealed no injury. Extra ports were carefully placed under direct laparoscopic visualization.   I could see  adhesions and Swiss cheese hernia primarily of greater omentum involving most of the upper abdomen  on the parietal peritoneum under the abdominal wall.  I did laparoscopic lysis of adhesions to expose the entire anterior abdominal wall.  I primarily used focused sharp dissection.  Very dense in the left upper quadrant.  I used careful blunt and focused cold scissors sharp dissection.  Did meticulous inspection of the splenic flexure and confirmed no injury to colon or bowel elsewhere.  Gastric sweep clean as well.  Moderate fatty change in the liver with probable steatohepatitis but no cirrhosis.  I gradually reduce greater omentum out of numerous Swiss cheese incisional hernias that were supraumbilical midline.  I freed off the falciform ligament and central peritoneum to expose the retrorectus fascia   I made sure hemostasis was good.  I mapped out the region using a needle passer.   To ensure that I would have at least 7 cm radial coverage outside of the hernia defect given his morbid obesity, I chose a 33x27cm dual sided mesh.  I placed #1 Prolene stitches around its edge about every 5 cm = 12 total.  I rolled the mesh & placed into the peritoneal cavity through the largest hernia defect.  I unrolled the mesh and positioned it  appropriately.  I secured the mesh to cover up the hernia defect using a laparoscopic suture passer to pass the tails of the Prolene through the abdominal wall & tagged them with clamps for good transfascial suturing.  I started out in four corners to make sure I had the mesh centered under the hernia defect appropriately, and then proceeded to work in quadrants.    We evacuated CO2 & desufflated the abdomen.  I tied the fascial stitches down. I freed the umbilical stalk off the fascia to expose the infraumbilical largest pocket where it placed the mesh through.  I closed this hernia fascial defect that I placed the mesh through using #1 PDS interrupted transverse stitches primarily.  I reinsufflated the abdomen. The mesh provided at least circumferential coverage around the entire region of hernia defects.  I secured the mesh centrally with an additional trans fascial stitch in & out the mesh using  under laparoscopic visualization.   I tacked the edges & central part of the mesh to the peritoneum/posterior rectus fascia with SecureStrap absorbable tacks.   I did reinspection. Hemostasis was good. Mesh laid well. I completed a broad field block of local anesthesia at fascial stitch sites & fascial closure areas.  I desufflated and primarily repaired one of the Swiss cheese hernias as there was some pneumoperitoneum leaking through that.  I repeated diagnostic laparoscopy confirmed that there was no bowel being tacked or other abnormality.  Did final inspection of the peritoneal cavity including the left upper quadrant and splenic flexure to confirm no injury or other concern.  Reassuring.  Capnoperitoneum was evacuated. Ports  were removed. The skin was closed with Monocryl at the port sites and Steri-Strips on the fascial stitch puncture sites.  Patient is being extubated to go to the recovery room.  I discussed operative findings, updated the patient's status, discussed probable steps to recovery, and gave  postoperative recommendations to the patient's spouse, Alyson Locket.  Patient really wanted to go home and was motivated to do so.  I think we will place him up on the floor and see if he can meet goals and leave later tonight.  I did have to put in a larger sheet of mesh than initially intended so I suspect he might need to stay at least overnight.  We will see recommendations were made.  Questions were answered.  She expressed understanding & appreciation.  Adin Hector, M.D., F.A.C.S. Gastrointestinal and Minimally Invasive Surgery [facility] [address], [place], [place] [address] [phone] Main / Paging  05/09/2021 3:58 PM

## 2021-05-09 NOTE — Anesthesia Procedure Notes (Signed)
Procedure Name: Intubation Date/Time: 05/09/2021 1:12 PM Performed by: Gerald Leitz, CRNA Pre-anesthesia Checklist: Patient identified, Patient being monitored, Timeout performed, Emergency Drugs available and Suction available Patient Re-evaluated:Patient Re-evaluated prior to induction Oxygen Delivery Method: Circle system utilized Preoxygenation: Pre-oxygenation with 100% oxygen Induction Type: IV induction Ventilation: Mask ventilation without difficulty Laryngoscope Size: Mac and 3 Grade View: Grade I Tube type: Oral Tube size: 7.5 mm Number of attempts: 1 Airway Equipment and Method: Stylet Placement Confirmation: ETT inserted through vocal cords under direct vision, positive ETCO2 and breath sounds checked- equal and bilateral Secured at: 23 cm Tube secured with: Tape Dental Injury: Teeth and Oropharynx as per pre-operative assessment

## 2021-05-10 DIAGNOSIS — K420 Umbilical hernia with obstruction, without gangrene: Secondary | ICD-10-CM | POA: Diagnosis not present

## 2021-05-10 MED ORDER — GABAPENTIN 300 MG PO CAPS
300.0000 mg | ORAL_CAPSULE | Freq: Three times a day (TID) | ORAL | Status: DC
  Administered 2021-05-10: 300 mg via ORAL
  Filled 2021-05-10: qty 1

## 2021-05-10 MED ORDER — GABAPENTIN 300 MG PO CAPS: 300.0000 mg | ORAL_CAPSULE | Freq: Three times a day (TID) | ORAL | 2 refills | Status: AC

## 2021-05-10 NOTE — Discharge Summary (Signed)
Physician Discharge Summary    Patient ID: [name] [name] MRN: [id number] DOB/AGE: October 21, 1960  61 y.o.  Patient Care Team: Kirk Ruths, MD as PCP - General (Internal Medicine) Wellington Hampshire, MD (Cardiology) Michael Boston, MD as Consulting Physician (General Surgery)  Admit date: 05/09/2021  Discharge date: 05/10/2021  Hospital Stay = 0 days    Discharge Diagnoses:  Principal Problem:   Recurrent incisional hernia with incarceration   1 Day Post-Op  05/09/2021  POST-OPERATIVE DIAGNOSIS:   VENTRAL INCISIONAL RECURRENT AND INCARCERATED ABDOMINAL WALL HERNIAE  SURGERY:  05/09/2021  Procedure(s): LAPAROSCOPIC VENTRAL HERNIA REPAIR WITH MESH WITH TAP BLOCK LYSIS OF ADHESION  SURGEON:    Surgeon(s): Michael Boston, MD  Consults: none   Hospital Course:   The patient underwent the surgery above.  Postoperatively, the patient gradually mobilized and advanced to a solid diet.  Pain and other symptoms were treated aggressively.    By the time of discharge, the patient was walking well the hallways, eating food, having flatus.  Pain was well-controlled on an oral medications.  Based on meeting discharge criteria and continuing to recover, I felt it was safe for the patient to be discharged from the hospital to further recover with close followup. Postoperative recommendations were discussed in detail.  They are written as well.  Discharged Condition: good  Discharge Exam: Blood pressure 115/67, pulse 81, temperature 98.3 F (36.8 C), temperature source Oral, resp. rate 20, height 6' (1.829 m), weight 91.2 kg, SpO2 96 %.  General: Pt awake/alert/oriented x4 in No acute distress Eyes: PERRL, normal EOM.  Sclera clear.  No icterus Neuro: CN II-XII intact w/o focal sensory/motor deficits. Lymph: No head/neck/groin lymphadenopathy Psych:  No delerium/psychosis/paranoia HENT: Normocephalic, Mucus membranes moist.  No thrush Neck: Supple, No tracheal  deviation Chest:  No chest wall pain w good excursion CV:  Pulses intact.  Regular rhythm MS: Normal AROM mjr joints.  No obvious deformity Abdomen: Soft.  Nondistended.  Mildly tender at incisions only.  No evidence of peritonitis.  No incarcerated hernias. Ext:  SCDs BLE.  No mjr edema.  No cyanosis Skin: No petechiae / purpura   Disposition:    Follow-up Information     Michael Boston, MD Follow up in 3 week(s).   Specialties: General Surgery, Colon and Rectal Surgery Why: To follow up after your operation Contact information: [place] [address] [phone]                 Discharge disposition: 01-Home or Self Care       Discharge Instructions     Call MD for:   Complete by: As directed    FEVER >101.5 F (Temperatures <101.14F occasionally happen and are not significant)   Call MD for:  extreme fatigue   Complete by: As directed    Call MD for:  persistant dizziness or light-headedness   Complete by: As directed    Call MD for:  persistant nausea and vomiting   Complete by: As directed    Call MD for:  redness, tenderness, or signs of infection (pain, swelling, redness, odor or green/yellow discharge around incision site)   Complete by: As directed    Call MD for:  severe uncontrolled pain   Complete by: As directed    Diet - low sodium heart healthy   Complete by: As directed    Follow a light diet the first few days at home.  Start with a bland diet such  as soups, liquids, starchy foods, low fat foods, etc.   If you feel full, bloated, or constipated, stay on a full liquid or pureed/blenderized diet for a few days until you feel better and no longer constipated. Gradually get back to a regular solid diet.  Avoid fast food or heavy meals the first week as you are more likely to get nauseated.   Discharge instructions   Complete by: As directed    One the day of your discharge from the hospital (or the next business  weekday), please call [facility] to set up or confirm an appointment to see your surgeon in the office for a follow-up appointment.  Usually it is 2-3 weeks after your surgery.  Other concerns If you are not getting better after two weeks or are noticing you are getting worse, contact our office [phone] for further advice.  We may need to adjust your medications, re-evaluate you in the office, send you to the emergency room, or see what other things we can do to help. The clinic staff is available to answer your questions during regular business hours (8:30am-5pm).  Please don't hesitate to call and ask to speak to one of our nurses for clinical concerns.    A surgeon from [facility] is always on call at the hospitals 24 hours/day If you have a medical emergency, go to the nearest emergency room or call 911.   Discharge wound care:   Complete by: As directed    It is good for closed incisions and even open wounds to be washed every day.  Shower every day.  Short baths are fine.  Wash the incisions and wounds clean with soap & water.    You may leave closed incisions open to air if it is dry.   You may cover the incision with clean gauze & replace it after your daily shower for comfort.  TEGADERM & STERISTRIPS:  You have clear gauze band-aid dressings over your closed incision(s).  You also have skin tapes called Steristrips on your incisions.  Leave these in place.  You may trim any edges that curl up with clean scissors.  You may remove all dressings & tapes in the shower in a week.   Driving Restrictions   Complete by: As directed    You may drive when you are no longer taking prescription pain medication, you can comfortably wear a seatbelt, and you can safely maneuver your car and apply brakes.   Increase activity slowly   Complete by: As directed    Lifting restrictions   Complete by: As directed    You may resume regular (light) daily activities beginning  the next day-such as daily self-care, walking, climbing stairs-gradually increasing activities as tolerated.   If you can walk 30 minutes without difficulty, it is safe to try more intense activity such as jogging, treadmill, bicycling, low-impact aerobics, swimming, etc. Save the most intensive and strenuous activity for last such as sit-ups, heavy lifting, contact sports, etc   Refrain from any heavy lifting or straining until you are off narcotics for pain control.   DO NOT PUSH THROUGH PAIN.   Let pain be your guide: If it hurts to do something, don't do it.   Pain is your body warning you to avoid that activity for another week until the pain goes down.   May shower / Bathe   Complete by: As directed    Wash / shower every day.  You may  shower over the dressings as they are waterproof.  Continue to shower over incision(s) after the dressing is off.   May walk up steps   Complete by: As directed    Sexual Activity Restrictions   Complete by: As directed    You may have sexual intercourse when it is comfortable. If it hurts to do something, stop.       Allergies as of 05/10/2021   No Known Allergies      Medication List     TAKE these medications    amLODipine 10 MG tablet Commonly known as: NORVASC Take 10 mg by mouth daily.   aspirin EC 81 MG tablet Take 81 mg by mouth daily.   clopidogrel 75 MG tablet Commonly known as: PLAVIX Take 75 mg by mouth daily.   gabapentin 300 MG capsule Commonly known as: NEURONTIN Take 1 capsule (300 mg total) by mouth 3 (three) times daily.   metoprolol 200 MG 24 hr tablet Commonly known as: TOPROL-XL Take 200 mg by mouth daily.   multivitamin with minerals Tabs tablet Take 1 tablet by mouth daily.   omega-3 acid ethyl esters 1 g capsule Commonly known as: LOVAZA Take 4 g by mouth daily.   pantoprazole 40 MG tablet Commonly known as: PROTONIX Take 40 mg by mouth daily.   rosuvastatin 40 MG tablet Commonly known as:  CRESTOR Take 40 mg by mouth daily.   traMADol 50 MG tablet Commonly known as: ULTRAM Take 1-2 tablets (50-100 mg total) by mouth every 6 (six) hours as needed for moderate pain or severe pain.               Discharge Care Instructions  (From admission, onward)           Start     Ordered   05/09/21 0000  Discharge wound care:       Comments: It is good for closed incisions and even open wounds to be washed every day.  Shower every day.  Short baths are fine.  Wash the incisions and wounds clean with soap & water.    You may leave closed incisions open to air if it is dry.   You may cover the incision with clean gauze & replace it after your daily shower for comfort.  TEGADERM & STERISTRIPS:  You have clear gauze band-aid dressings over your closed incision(s).  You also have skin tapes called Steristrips on your incisions.  Leave these in place.  You may trim any edges that curl up with clean scissors.  You may remove all dressings & tapes in the shower in a week.   05/09/21 1241            Significant Diagnostic Studies:  Results for orders placed or performed during the hospital encounter of 05/09/21 (from the past 72 hour(s))  Glucose, capillary     Status: Abnormal   Collection Time: 05/09/21 11:27 AM  Result Value Ref Range   Glucose-Capillary 136 (H) 70 - 99 mg/dL    Comment: Glucose reference range applies only to samples taken after fasting for at least 8 hours.    No results found.  Past Medical History:  Diagnosis Date   Anemia    pernicious anemia   Cancer (HCC)    renal   Chest pain    Unspecified   Chronic kidney disease    COPD (chronic obstructive pulmonary disease) ([place])    Dysrhythmia    pac's.  okay now.   GERD (  gastroesophageal reflux disease)    History of kidney stones    Hypertension    Hypertriglyceridemia    Kidney stone on left side    2018   Palpitations    Peripheral vascular disease ([place]) 2019   Pre-diabetes    Renal  insufficiency    Renal mass    Thyroid nodule 2019   being monitored    Past Surgical History:  Procedure Laterality Date   COLONOSCOPY N/A 01/23/2021   Procedure: COLONOSCOPY;  Surgeon: Lucilla Lame, MD;  Location: [facility] ENDOSCOPY;  Service: Endoscopy;  Laterality: N/A;   CYSTOSCOPY W/ RETROGRADES Bilateral 03/23/2017   Procedure: CYSTOSCOPY WITH RETROGRADE PYELOGRAM;  Surgeon: Abbie Sons, MD;  Location: [facility] ORS;  Service: Urology;  Laterality: Bilateral;   CYSTOSCOPY W/ URETERAL STENT PLACEMENT Left 03/31/2017   Procedure: CYSTOSCOPY WITH STENT REPLACEMENT;  Surgeon: Abbie Sons, MD;  Location: [facility] ORS;  Service: Urology;  Laterality: Left;   CYSTOSCOPY/RETROGRADE/URETEROSCOPY/STONE EXTRACTION WITH BASKET Left 03/31/2017   Procedure: CYSTOSCOPY/RETROGRADE/URETEROSCOPY/STONE EXTRACTION WITH BASKET;  Surgeon: Abbie Sons, MD;  Location: [facility] ORS;  Service: Urology;  Laterality: Left;   CYSTOSCOPY/URETEROSCOPY/HOLMIUM LASER/STENT PLACEMENT Left 03/23/2017   Procedure: CYSTOSCOPY/URETEROSCOPY/HOLMIUM LASER/STENT PLACEMENT;  Surgeon: Abbie Sons, MD;  Location: [facility] ORS;  Service: Urology;  Laterality: Left;   ESOPHAGOGASTRODUODENOSCOPY (EGD) WITH PROPOFOL N/A 10/22/2015   Procedure: ESOPHAGOGASTRODUODENOSCOPY (EGD) WITH PROPOFOL;  Surgeon: Lucilla Lame, MD;  Location: [facility] ENDOSCOPY;  Service: Endoscopy;  Laterality: N/A;   EXTRACORPOREAL SHOCK WAVE LITHOTRIPSY  2016   EXTRACORPOREAL SHOCK WAVE LITHOTRIPSY Left 09/13/2014   Procedure: EXTRACORPOREAL SHOCK WAVE LITHOTRIPSY (ESWL);  Surgeon: Hollice Espy, MD;  Location: [facility] ORS;  Service: Urology;  Laterality: Left;   HERNIA REPAIR     IR RADIOLOGIST EVAL & MGMT  08/01/2020   IR RADIOLOGIST EVAL & MGMT  09/17/2020   IR RADIOLOGIST EVAL & MGMT  01/21/2021   LOWER EXTREMITY ANGIOGRAPHY Left 07/15/2017   Procedure: LOWER EXTREMITY ANGIOGRAPHY;  Surgeon: Algernon Huxley, MD;  Location: [place] [facility];  Service:  Cardiovascular;  Laterality: Left;   lower extremity interventions x2 right leg Right    stent   PERIPHERAL VASCULAR CATHETERIZATION Right 08/20/2014   Procedure: Lower Extremity Angiography;  Surgeon: Algernon Huxley, MD;  Location: [place] [facility];  Service: Cardiovascular;  Laterality: Right;   PERIPHERAL VASCULAR CATHETERIZATION Right 08/20/2014   Procedure: Lower Extremity Intervention;  Surgeon: Algernon Huxley, MD;  Location: [place] [facility];  Service: Cardiovascular;  Laterality: Right;   RADIOLOGY WITH ANESTHESIA N/A 08/28/2020   Procedure: RADIOLOGY WITH ANESTHESIA CT WITH CRYOABLATION;  Surgeon: Greggory Keen, MD;  Location: [facility] ORS;  Service: Anesthesiology;  Laterality: N/A;   VENTRAL HERNIA REPAIR  08/11/2019   Procedure: HERNIA REPAIR VENTRAL ADULT WITH REPAIR OF LARGE INTESTINE LACERATION;  Surgeon: Herbert Pun, MD;  Location: [facility] ORS;  Service: General;;   XI ROBOTIC ASSISTED VENTRAL HERNIA N/A 08/11/2019   Procedure: XI ROBOTIC ASSISTED VENTRAL HERNIA;  Surgeon: Herbert Pun, MD;  Location: [facility] ORS;  Service: General;  Laterality: N/A;  Converted to open procedure    Social History   Socioeconomic History   Marital status: Married    Spouse name: Not on file   Number of children: Not on file   Years of education: Not on file   Highest education level: Not on file  Occupational History   Occupation: Full Time  Tobacco Use   Smoking status: Former    Packs/day: 1.00  Years: 37.00    Pack years: 37.00    Types: Cigarettes    Quit date: 04/21/2013    Years since quitting: 8.0   Smokeless tobacco: Never  Vaping Use   Vaping Use: Never used  Substance and Sexual Activity   Alcohol use: Yes    Alcohol/week: 14.0 standard drinks    Types: 7 Glasses of wine, 7 Cans of beer per week    Comment: 2 drinks daily   Drug use: No   Sexual activity: Yes  Other Topics Concern   Not on file  Social History Narrative   Regular exercise: No   Social  Determinants of Health   Financial Resource Strain: Not on file  Food Insecurity: Not on file  Transportation Needs: Not on file  Physical Activity: Not on file  Stress: Not on file  Social Connections: Not on file  Intimate Partner Violence: Not on file    Family History  Problem Relation Age of Onset   Heart failure Other     Current Facility-Administered Medications  Medication Dose Route Frequency Provider Last Rate Last Admin   0.9 %  sodium chloride infusion  250 mL Intravenous PRN Michael Boston, MD       acetaminophen (TYLENOL) tablet 1,000 mg  1,000 mg Oral Lajuana Ripple, MD   1,000 mg at 05/10/21 0548   amLODipine (NORVASC) tablet 10 mg  10 mg Oral Daily Michael Boston, MD       aspirin EC tablet 81 mg  81 mg Oral Daily Michael Boston, MD   81 mg at 05/09/21 2027   bisacodyl (DULCOLAX) suppository 10 mg  10 mg Rectal Daily PRN Michael Boston, MD       diphenhydrAMINE (BENADRYL) 12.5 MG/5ML elixir 12.5 mg  12.5 mg Oral Q6H PRN Michael Boston, MD       Or   diphenhydrAMINE (BENADRYL) injection 12.5 mg  12.5 mg Intravenous Q6H PRN Michael Boston, MD       enalaprilat (VASOTEC) injection 0.625-1.25 mg  0.625-1.25 mg Intravenous Q6H PRN Michael Boston, MD       enoxaparin (LOVENOX) injection 40 mg  40 mg Subcutaneous Q24H Michael Boston, MD       gabapentin (NEURONTIN) capsule 300 mg  300 mg Oral TID Michael Boston, MD       HYDROmorphone (DILAUDID) injection 0.5-2 mg  0.5-2 mg Intravenous Q4H PRN Michael Boston, MD       lactated ringers bolus 1,000 mL  1,000 mL Intravenous Q8H PRN Rona Tomson, Remo Lipps, MD       lip balm (CARMEX) ointment 1 application  1 application Topical BID Michael Boston, MD   1 application at 52/84/13 2027   magic mouthwash  15 mL Oral QID PRN Michael Boston, MD       magnesium hydroxide (MILK OF MAGNESIA) suspension 30 mL  30 mL Oral Daily PRN Michael Boston, MD       methocarbamol (ROBAXIN) 1,000 mg in dextrose 5 % 100 mL IVPB  1,000 mg Intravenous Q6H PRN Michael Boston, MD       methocarbamol (ROBAXIN) tablet 1,000 mg  1,000 mg Oral Q6H PRN Michael Boston, MD       metoprolol succinate (TOPROL-XL) 24 hr tablet 200 mg  200 mg Oral Daily Samayra Hebel, Remo Lipps, MD       metoprolol tartrate (LOPRESSOR) injection 5 mg  5 mg Intravenous Q6H PRN Michael Boston, MD       multivitamin with minerals tablet 1 tablet  1 tablet  Oral Daily Michael Boston, MD   1 tablet at 05/09/21 2027   ondansetron (ZOFRAN-ODT) disintegrating tablet 4 mg  4 mg Oral Q6H PRN Michael Boston, MD       Or   ondansetron [facility]) injection 4 mg  4 mg Intravenous Q6H PRN Michael Boston, MD       oxyCODONE (Oxy IR/ROXICODONE) immediate release tablet 5-10 mg  5-10 mg Oral Q4H PRN Michael Boston, MD       pantoprazole (PROTONIX) EC tablet 40 mg  40 mg Oral Daily Michael Boston, MD       polycarbophil (FIBERCON) tablet 625 mg  625 mg Oral BID Michael Boston, MD   625 mg at 05/09/21 2222   prochlorperazine (COMPAZINE) tablet 10 mg  10 mg Oral Q6H PRN Michael Boston, MD       Or   prochlorperazine (COMPAZINE) injection 5-10 mg  5-10 mg Intravenous Q6H PRN Michael Boston, MD       rosuvastatin (CRESTOR) tablet 40 mg  40 mg Oral Daily Michael Boston, MD       simethicone [facility]) chewable tablet 40 mg  40 mg Oral Q6H PRN Michael Boston, MD       sodium chloride flush (NS) 0.9 % injection 3 mL  3 mL Intravenous Gorden Harms, MD       sodium chloride flush (NS) 0.9 % injection 3 mL  3 mL Intravenous PRN Michael Boston, MD       traMADol Veatrice Bourbon) tablet 50-100 mg  50-100 mg Oral Q6H PRN Michael Boston, MD         No Known Allergies  Signed: Morton Peters, MD, FACS, MASCRS Esophageal, Gastrointestinal & Colorectal Surgery Robotic and Minimally Invasive Surgery  [facility], [place]  [address] [place], [place] [address] [phone] Fax [phone] Main  CONTACT INFORMATION:  Weekday (9AM-5PM): Call [facility] main  office at [phone]  Weeknight (5PM-9AM) or Weekend/Holiday: Check [email or web address] (password " [email or web address]") for General Surgery [facility] coverage  (Please, do not use SecureChat as it is not reliable communication to operating surgeons for immediate patient care)      05/10/2021, 8:03 AM

## 2021-05-10 NOTE — Plan of Care (Signed)

## 2021-05-10 NOTE — Plan of Care (Signed)
All discharge instructions were given to the Pt. Discussed pain management. All questions were answered. 

## 2021-05-12 ENCOUNTER — Encounter (HOSPITAL_COMMUNITY): Payer: Self-pay | Admitting: Surgery

## 2021-06-20 ENCOUNTER — Telehealth: Payer: Self-pay | Admitting: *Deleted

## 2021-06-20 ENCOUNTER — Other Ambulatory Visit: Payer: Self-pay | Admitting: Interventional Radiology

## 2021-06-20 ENCOUNTER — Other Ambulatory Visit: Payer: Self-pay | Admitting: Urology

## 2021-06-20 DIAGNOSIS — N2889 Other specified disorders of kidney and ureter: Secondary | ICD-10-CM

## 2021-06-30 ENCOUNTER — Other Ambulatory Visit: Payer: Self-pay | Admitting: Urology

## 2021-06-30 DIAGNOSIS — N2889 Other specified disorders of kidney and ureter: Secondary | ICD-10-CM

## 2021-07-25 ENCOUNTER — Other Ambulatory Visit: Payer: Managed Care, Other (non HMO)

## 2021-07-31 ENCOUNTER — Telehealth: Payer: Managed Care, Other (non HMO)

## 2021-08-19 ENCOUNTER — Other Ambulatory Visit: Payer: Self-pay | Admitting: *Deleted

## 2021-08-19 DIAGNOSIS — N200 Calculus of kidney: Secondary | ICD-10-CM

## 2021-08-20 ENCOUNTER — Ambulatory Visit: Payer: Managed Care, Other (non HMO) | Admitting: Urology

## 2021-08-20 ENCOUNTER — Encounter: Payer: Self-pay | Admitting: Urology

## 2021-08-20 VITALS — BP 131/71 | HR 73 | Ht 72.0 in | Wt 210.0 lb

## 2021-08-20 DIAGNOSIS — Z87442 Personal history of urinary calculi: Secondary | ICD-10-CM

## 2021-08-20 DIAGNOSIS — N2889 Other specified disorders of kidney and ureter: Secondary | ICD-10-CM

## 2021-08-20 DIAGNOSIS — N200 Calculus of kidney: Secondary | ICD-10-CM

## 2021-08-20 NOTE — Progress Notes (Signed)
? ?08/20/2021 ?1:18 PM  ? ?[name] [name] ?[birth year] ?[id number] ? ?Referring provider: Kirk Ruths, MD ?[place],  [place] [id number] ? ?Chief Complaint  ?Patient presents with  ? Nephrolithiasis  ? ? ?Urologic history: ? ?1.  Recurrent nephrolithiasis ?SWL 2016 ?Staged ureteroscopy 03/2017 left renal ureteral calculi ?8 cm left lower pole calculus CT 2022 ?Stone analysis in 2018 was 100% uric acid ?Declined metabolic evaluation ? ?2.  Right renal mass ?Incidental 2 cm solid, enhancing right renal mass 2022 ?Percutaneous ablation by IR 08/2020; no biopsy performed ? ?HPI: ?61 y.o. presents for follow-up visit. ? ?Doing well since last visit ?No bothersome LUTS ?Denies dysuria, gross hematuria ?Denies flank, abdominal or pelvic pain ?Due to have follow-up CT October 2023 by IR for post cryo imaging ?Left lower pole calculus on CT which was not seen on [facility] ? ? ?PMH: ?Past Medical History:  ?Diagnosis Date  ? Anemia   ? pernicious anemia  ? Cancer [facility])   ? renal  ? Chest pain   ? Unspecified  ? Chronic kidney disease   ? COPD (chronic obstructive pulmonary disease) ([place])   ? Dysrhythmia   ? pac's.  okay now.  ? GERD (gastroesophageal reflux disease)   ? History of kidney stones   ? Hypertension   ? Hypertriglyceridemia   ? Kidney stone on left side   ? 2018  ? Palpitations   ? Peripheral vascular disease ([place]) 2019  ? Pre-diabetes   ? Renal insufficiency   ? Renal mass   ? Thyroid nodule 2019  ? being monitored  ? ? ?Surgical History: ?Past Surgical History:  ?Procedure Laterality Date  ? COLONOSCOPY N/A 01/23/2021  ? Procedure: COLONOSCOPY;  Surgeon: Lucilla Lame, MD;  Location: [facility] ENDOSCOPY;  Service: Endoscopy;  Laterality: N/A;  ? CYSTOSCOPY W/ RETROGRADES Bilateral 03/23/2017  ? Procedure: CYSTOSCOPY WITH RETROGRADE PYELOGRAM;  Surgeon: Abbie Sons, MD;  Location: [facility] ORS;  Service: Urology;  Laterality: Bilateral;  ? CYSTOSCOPY W/ URETERAL STENT  PLACEMENT Left 03/31/2017  ? Procedure: CYSTOSCOPY WITH STENT REPLACEMENT;  Surgeon: Abbie Sons, MD;  Location: [facility] ORS;  Service: Urology;  Laterality: Left;  ? CYSTOSCOPY/RETROGRADE/URETEROSCOPY/STONE EXTRACTION WITH BASKET Left 03/31/2017  ? Procedure: CYSTOSCOPY/RETROGRADE/URETEROSCOPY/STONE EXTRACTION WITH BASKET;  Surgeon: Abbie Sons, MD;  Location: [facility] ORS;  Service: Urology;  Laterality: Left;  ? CYSTOSCOPY/URETEROSCOPY/HOLMIUM LASER/STENT PLACEMENT Left 03/23/2017  ? Procedure: CYSTOSCOPY/URETEROSCOPY/HOLMIUM LASER/STENT PLACEMENT;  Surgeon: Abbie Sons, MD;  Location: [facility] ORS;  Service: Urology;  Laterality: Left;  ? ESOPHAGOGASTRODUODENOSCOPY (EGD) WITH PROPOFOL N/A 10/22/2015  ? Procedure: ESOPHAGOGASTRODUODENOSCOPY (EGD) WITH PROPOFOL;  Surgeon: Lucilla Lame, MD;  Location: [facility] ENDOSCOPY;  Service: Endoscopy;  Laterality: N/A;  ? EXTRACORPOREAL SHOCK WAVE LITHOTRIPSY  2016  ? EXTRACORPOREAL SHOCK WAVE LITHOTRIPSY Left 09/13/2014  ? Procedure: EXTRACORPOREAL SHOCK WAVE LITHOTRIPSY (ESWL);  Surgeon: Hollice Espy, MD;  Location: [facility] ORS;  Service: Urology;  Laterality: Left;  ? HERNIA REPAIR    ? IR RADIOLOGIST EVAL & MGMT  08/01/2020  ? IR RADIOLOGIST EVAL & MGMT  09/17/2020  ? IR RADIOLOGIST EVAL & MGMT  01/21/2021  ? LOWER EXTREMITY ANGIOGRAPHY Left 07/15/2017  ? Procedure: LOWER EXTREMITY ANGIOGRAPHY;  Surgeon: Algernon Huxley, MD;  Location: [place] CV LAB;  Service: Cardiovascular;  Laterality: Left;  ? lower extremity interventions x2 right leg Right   ? stent  ? LYSIS OF ADHESION N/A 05/09/2021  ? Procedure: LYSIS OF ADHESION;  Surgeon: Johney Maine,  Remo Lipps, MD;  Location: [facility] ORS;  Service: General;  Laterality: N/A;  ? PERIPHERAL VASCULAR CATHETERIZATION Right 08/20/2014  ? Procedure: Lower Extremity Angiography;  Surgeon: Algernon Huxley, MD;  Location: [place] [facility];  Service: Cardiovascular;  Laterality: Right;  ? PERIPHERAL VASCULAR CATHETERIZATION Right 08/20/2014  ? Procedure:  Lower Extremity Intervention;  Surgeon: Algernon Huxley, MD;  Location: [place] [facility];  Service: Cardiovascular;  Laterality: Right;  ? RADIOLOGY WITH ANESTHESIA N/A 08/28/2020  ? Procedure: RADIOLOGY WITH ANESTHESIA CT WITH CRYOABLATION;  Surgeon: Greggory Keen, MD;  Location: [facility] ORS;  Service: Anesthesiology;  Laterality: N/A;  ? VENTRAL HERNIA REPAIR  08/11/2019  ? Procedure: HERNIA REPAIR VENTRAL ADULT WITH REPAIR OF LARGE INTESTINE LACERATION;  Surgeon: Herbert Pun, MD;  Location: [facility] ORS;  Service: General;;  ? VENTRAL HERNIA REPAIR N/A 05/09/2021  ? Procedure: LAPAROSCOPIC VENTRAL HERNIA REPAIR WITH MESH WITH TAP BLOCK;  Surgeon: Michael Boston, MD;  Location: [facility];  Service: General;  Laterality: N/A;  ? XI ROBOTIC ASSISTED VENTRAL HERNIA N/A 08/11/2019  ? Procedure: XI ROBOTIC ASSISTED VENTRAL HERNIA;  Surgeon: Herbert Pun, MD;  Location: [facility] ORS;  Service: General;  Laterality: N/A;  Converted to open procedure  ? ? ?Home Medications:  ?Allergies as of 08/20/2021   ?No Known Allergies ?  ? ?  ?Medication List  ?  ? ?  ? Accurate as of Aug 20, 2021  1:18 PM. If you have any questions, ask your nurse or doctor.  ?  ?  ? ?  ? ?amLODipine 10 MG tablet ?Commonly known as: NORVASC ?Take 10 mg by mouth daily. ?  ?aspirin EC 81 MG tablet ?Take 81 mg by mouth daily. ?  ?clopidogrel 75 MG tablet ?Commonly known as: PLAVIX ?Take 75 mg by mouth daily. ?  ?gabapentin 300 MG capsule ?Commonly known as: NEURONTIN ?Take 1 capsule (300 mg total) by mouth 3 (three) times daily. ?  ?metoprolol 200 MG 24 hr tablet ?Commonly known as: TOPROL-XL ?Take 200 mg by mouth daily. ?  ?multivitamin with minerals Tabs tablet ?Take 1 tablet by mouth daily. ?  ?omega-3 acid ethyl esters 1 g capsule ?Commonly known as: LOVAZA ?Take 4 g by mouth daily. ?  ?pantoprazole 40 MG tablet ?Commonly known as: PROTONIX ?Take 40 mg by mouth daily. ?  ?rosuvastatin 40 MG tablet ?Commonly known as: CRESTOR ?Take 40 mg by mouth  daily. ?  ?traMADol 50 MG tablet ?Commonly known as: ULTRAM ?Take 1-2 tablets (50-100 mg total) by mouth every 6 (six) hours as needed for moderate pain or severe pain. ?  ? ?  ? ? ?Allergies: No Known Allergies ? ?Family History: ?Family History  ?Problem Relation Age of Onset  ? Heart failure Other   ? ? ?Social History:  reports that he quit smoking about 8 years ago. His smoking use included cigarettes. He has a 37.00 pack-year smoking history. He has never used smokeless tobacco. He reports current alcohol use of about 14.0 standard drinks per week. He reports that he does not use drugs. ? ? ?Physical Exam: ?BP 131/71   Pulse 73   Ht 6' (1.829 m)   Wt 210 lb (95.3 kg)   BMI 28.48 kg/m?   ?Constitutional:  Alert and oriented, No acute distress. ?HEENT: [place] AT, moist mucus membranes.  Trachea midline, no masses. ?Cardiovascular: No clubbing, cyanosis, or edema. ?Respiratory: Normal respiratory effort, no increased work of breathing. ?Psychiatric: Normal mood and affect. ? ? ?Assessment & Plan:   ? ?  1.  Left nephrolithiasis ?Calculus not visualized on plain x-ray ?Follow-up CT scheduled October 2023 ? ?2.  History of right renal mass ?Successful percutaneous cryoablation ?Due for follow-up CT by IR October 2023 ? ? ?Abbie Sons, MD ? ?[facility] ?[address], [address] ?[place], [place] [address] ?[phone] ? ? ?

## 2021-08-21 NOTE — Telephone Encounter (Signed)
error 

## 2021-09-02 DIAGNOSIS — E669 Obesity, unspecified: Secondary | ICD-10-CM | POA: Insufficient documentation

## 2021-09-02 DIAGNOSIS — R2242 Localized swelling, mass and lump, left lower limb: Secondary | ICD-10-CM | POA: Insufficient documentation

## 2021-09-17 ENCOUNTER — Other Ambulatory Visit (INDEPENDENT_AMBULATORY_CARE_PROVIDER_SITE_OTHER): Payer: Self-pay | Admitting: Vascular Surgery

## 2021-09-17 DIAGNOSIS — I6523 Occlusion and stenosis of bilateral carotid arteries: Secondary | ICD-10-CM

## 2021-09-17 DIAGNOSIS — I739 Peripheral vascular disease, unspecified: Secondary | ICD-10-CM

## 2021-09-23 ENCOUNTER — Encounter (INDEPENDENT_AMBULATORY_CARE_PROVIDER_SITE_OTHER): Payer: Managed Care, Other (non HMO)

## 2021-09-23 ENCOUNTER — Ambulatory Visit (INDEPENDENT_AMBULATORY_CARE_PROVIDER_SITE_OTHER): Payer: Managed Care, Other (non HMO) | Admitting: Vascular Surgery

## 2021-11-25 ENCOUNTER — Ambulatory Visit (INDEPENDENT_AMBULATORY_CARE_PROVIDER_SITE_OTHER): Payer: Managed Care, Other (non HMO)

## 2021-11-25 ENCOUNTER — Encounter (INDEPENDENT_AMBULATORY_CARE_PROVIDER_SITE_OTHER): Payer: Self-pay | Admitting: Vascular Surgery

## 2021-11-25 ENCOUNTER — Ambulatory Visit (INDEPENDENT_AMBULATORY_CARE_PROVIDER_SITE_OTHER): Payer: Managed Care, Other (non HMO) | Admitting: Vascular Surgery

## 2021-11-25 VITALS — BP 124/68 | HR 65 | Resp 16 | Wt 210.0 lb

## 2021-11-25 DIAGNOSIS — Z9889 Other specified postprocedural states: Secondary | ICD-10-CM | POA: Diagnosis not present

## 2021-11-25 DIAGNOSIS — I10 Essential (primary) hypertension: Secondary | ICD-10-CM

## 2021-11-25 DIAGNOSIS — I739 Peripheral vascular disease, unspecified: Secondary | ICD-10-CM

## 2021-11-25 DIAGNOSIS — M7989 Other specified soft tissue disorders: Secondary | ICD-10-CM

## 2021-11-25 DIAGNOSIS — I6523 Occlusion and stenosis of bilateral carotid arteries: Secondary | ICD-10-CM

## 2021-11-25 DIAGNOSIS — I70213 Atherosclerosis of native arteries of extremities with intermittent claudication, bilateral legs: Secondary | ICD-10-CM

## 2021-11-26 NOTE — Assessment & Plan Note (Signed)
improved

## 2021-11-26 NOTE — Progress Notes (Signed)
MRN : [id number]  [name] [name] is a 61 y.o. ([birth year]) male who presents with chief complaint of  Chief Complaint  Patient presents with   Follow-up    Ultrasound follow up  .  History of Present Illness: Patient returns today in follow up of multiple vascular issues.  His leg swelling is better.  His activity has been good.  He has not been bothered by any lifestyle limiting claudication, ischemic rest pain, or ulceration.  He has stopped smoking and since then his legs have done much better after bilateral lower extremity revascularizations in the past.  His most recent intervention was 4 years ago.  ABIs today are 0.85 on the right and 0.90 on the left with multiphasic waveforms and good digital pressures bilaterally. He is also followed for carotid disease.  He has had no focal neurologic symptoms. Specifically, the patient denies amaurosis fugax, speech or swallowing difficulties, or arm or leg weakness or numbness. Duplex today shows stable 1-39% stenosis in both carotid arteries.   Current Outpatient Medications  Medication Sig Dispense Refill   amLODipine (NORVASC) 10 MG tablet Take 10 mg by mouth daily.     aspirin EC 81 MG tablet Take 81 mg by mouth daily.     clopidogrel (PLAVIX) 75 MG tablet Take 75 mg by mouth daily.     gabapentin (NEURONTIN) 300 MG capsule Take 1 capsule (300 mg total) by mouth 3 (three) times daily. 60 capsule 2   metoprolol (TOPROL-XL) 200 MG 24 hr tablet Take 200 mg by mouth daily.      Multiple Vitamin (MULTIVITAMIN WITH MINERALS) TABS tablet Take 1 tablet by mouth daily.     omega-3 acid ethyl esters (LOVAZA) 1 G capsule Take 4 g by mouth daily.      pantoprazole (PROTONIX) 40 MG tablet Take 40 mg by mouth daily.     rosuvastatin (CRESTOR) 40 MG tablet Take 40 mg by mouth daily.      No current facility-administered medications for this visit.    Past Medical History:  Diagnosis Date   Anemia    pernicious anemia   Cancer (HCC)     renal   Chest pain    Unspecified   Chronic kidney disease    COPD (chronic obstructive pulmonary disease) ([place])    Dysrhythmia    pac's.  okay now.   GERD (gastroesophageal reflux disease)    History of kidney stones    Hypertension    Hypertriglyceridemia    Kidney stone on left side    2018   Palpitations    Peripheral vascular disease ([place]) 2019   Pre-diabetes    Renal insufficiency    Renal mass    Thyroid nodule 2019   being monitored    Past Surgical History:  Procedure Laterality Date   COLONOSCOPY N/A 01/23/2021   Procedure: COLONOSCOPY;  Surgeon: Lucilla Lame, MD;  Location: [facility] ENDOSCOPY;  Service: Endoscopy;  Laterality: N/A;   CYSTOSCOPY W/ RETROGRADES Bilateral 03/23/2017   Procedure: CYSTOSCOPY WITH RETROGRADE PYELOGRAM;  Surgeon: Abbie Sons, MD;  Location: [facility] ORS;  Service: Urology;  Laterality: Bilateral;   CYSTOSCOPY W/ URETERAL STENT PLACEMENT Left 03/31/2017   Procedure: CYSTOSCOPY WITH STENT REPLACEMENT;  Surgeon: Abbie Sons, MD;  Location: [facility] ORS;  Service: Urology;  Laterality: Left;   CYSTOSCOPY/RETROGRADE/URETEROSCOPY/STONE EXTRACTION WITH BASKET Left 03/31/2017   Procedure: CYSTOSCOPY/RETROGRADE/URETEROSCOPY/STONE EXTRACTION WITH BASKET;  Surgeon: Abbie Sons, MD;  Location: [facility] ORS;  Service: Urology;  Laterality: Left;  CYSTOSCOPY/URETEROSCOPY/HOLMIUM LASER/STENT PLACEMENT Left 03/23/2017   Procedure: CYSTOSCOPY/URETEROSCOPY/HOLMIUM LASER/STENT PLACEMENT;  Surgeon: Abbie Sons, MD;  Location: [facility] ORS;  Service: Urology;  Laterality: Left;   ESOPHAGOGASTRODUODENOSCOPY (EGD) WITH PROPOFOL N/A 10/22/2015   Procedure: ESOPHAGOGASTRODUODENOSCOPY (EGD) WITH PROPOFOL;  Surgeon: Lucilla Lame, MD;  Location: [facility] ENDOSCOPY;  Service: Endoscopy;  Laterality: N/A;   EXTRACORPOREAL SHOCK WAVE LITHOTRIPSY  2016   EXTRACORPOREAL SHOCK WAVE LITHOTRIPSY Left 09/13/2014   Procedure: EXTRACORPOREAL SHOCK WAVE LITHOTRIPSY (ESWL);  Surgeon:  Hollice Espy, MD;  Location: [facility] ORS;  Service: Urology;  Laterality: Left;   HERNIA REPAIR     IR RADIOLOGIST EVAL & MGMT  08/01/2020   IR RADIOLOGIST EVAL & MGMT  09/17/2020   IR RADIOLOGIST EVAL & MGMT  01/21/2021   LOWER EXTREMITY ANGIOGRAPHY Left 07/15/2017   Procedure: LOWER EXTREMITY ANGIOGRAPHY;  Surgeon: Algernon Huxley, MD;  Location: [place] [facility];  Service: Cardiovascular;  Laterality: Left;   lower extremity interventions x2 right leg Right    stent   LYSIS OF ADHESION N/A 05/09/2021   Procedure: LYSIS OF ADHESION;  Surgeon: Michael Boston, MD;  Location: [facility];  Service: General;  Laterality: N/A;   PERIPHERAL VASCULAR CATHETERIZATION Right 08/20/2014   Procedure: Lower Extremity Angiography;  Surgeon: Algernon Huxley, MD;  Location: [place] [facility];  Service: Cardiovascular;  Laterality: Right;   PERIPHERAL VASCULAR CATHETERIZATION Right 08/20/2014   Procedure: Lower Extremity Intervention;  Surgeon: Algernon Huxley, MD;  Location: [place] [facility];  Service: Cardiovascular;  Laterality: Right;   RADIOLOGY WITH ANESTHESIA N/A 08/28/2020   Procedure: RADIOLOGY WITH ANESTHESIA CT WITH CRYOABLATION;  Surgeon: Greggory Keen, MD;  Location: [facility];  Service: Anesthesiology;  Laterality: N/A;   VENTRAL HERNIA REPAIR  08/11/2019   Procedure: HERNIA REPAIR VENTRAL ADULT WITH REPAIR OF LARGE INTESTINE LACERATION;  Surgeon: Herbert Pun, MD;  Location: [facility] OR[facility];  Service: General;;   VENTRAL HERNIA REPAIR N/A 05/09/2021   Procedure: LAPAROSCOPIC VENTRAL HERNIA REPAIR WITH MESH WITH TAP BLOCK;  Surgeon: Michael Boston, MD;  Location: [facility];  Service: General;  Laterality: N/A;   XI ROBOTIC ASSISTED VENTRAL HERNIA N/A 08/11/2019   Procedure: XI ROBOTIC ASSISTED VENTRAL HERNIA;  Surgeon: Herbert Pun, MD;  Location: [facility] ORS;  Service: General;  Laterality: N/A;  Converted to open procedure     Social History   Tobacco Use   Smoking status: Former    Packs/day: 1.00     Years: 37.00    Total pack years: 37.00    Types: Cigarettes    Quit date: 04/21/2013    Years since quitting: 8.6   Smokeless tobacco: Never  Vaping Use   Vaping Use: Never used  Substance Use Topics   Alcohol use: Yes    Alcohol/week: 14.0 standard drinks of alcohol    Types: 7 Glasses of wine, 7 Cans of beer per week    Comment: 2 drinks daily   Drug use: No      Family History  Problem Relation Age of Onset   Heart failure Other   No bleeding or clotting disorders No aneurysms  No Known Allergies  REVIEW OF SYSTEMS (Negative unless checked)   Constitutional: '[]'$ Weight loss  '[]'$ Fever  '[]'$ Chills Cardiac: '[]'$ Chest pain   '[]'$ Chest pressure   '[]'$ Palpitations   '[]'$ Shortness of breath when laying flat   '[]'$ Shortness of breath at rest   '[]'$ Shortness of breath with exertion. Vascular:  '[x]'$ Pain in legs with walking   '[]'$ Pain in legs at rest   '[]'$   Pain in legs when laying flat   '[]'$ Claudication   '[]'$ Pain in feet when walking  '[]'$ Pain in feet at rest  '[]'$ Pain in feet when laying flat   '[]'$ History of DVT   '[]'$ Phlebitis   '[x]'$ Swelling in legs   '[]'$ Varicose veins   '[]'$ Non-healing ulcers Pulmonary:   '[]'$ Uses home oxygen   '[]'$ Productive cough   '[]'$ Hemoptysis   '[]'$ Wheeze  '[]'$ COPD   '[]'$ Asthma Neurologic:  '[]'$ Dizziness  '[]'$ Blackouts   '[]'$ Seizures   '[]'$ History of stroke   '[]'$ History of TIA  '[]'$ Aphasia   '[]'$ Temporary blindness   '[]'$ Dysphagia   '[]'$ Weakness or numbness in arms   '[]'$ Weakness or numbness in legs Musculoskeletal:  '[x]'$ Arthritis   '[]'$ Joint swelling   '[]'$ Joint pain   '[]'$ Low back pain Hematologic:  '[]'$ Easy bruising  '[]'$ Easy bleeding   '[]'$ Hypercoagulable state   '[x]'$ Anemic   Gastrointestinal:  '[]'$ Blood in stool   '[]'$ Vomiting blood  '[x]'$ Gastroesophageal reflux/heartburn   '[]'$ Abdominal pain Genitourinary:  '[x]'$ Chronic kidney disease   '[]'$ Difficult urination  '[]'$ Frequent urination  '[]'$ Burning with urination   '[]'$ Hematuria Skin:  '[]'$ Rashes   '[]'$ Ulcers   '[]'$ Wounds Psychological:  '[]'$ History of anxiety   '[]'$  History of major depression.   Physical  Examination  BP 124/68 (BP Location: Right Arm)   Pulse 65   Resp 16   Wt 210 lb (95.3 kg)   BMI 28.48 kg/m  Gen:  WD/WN, NAD Head: [place]/AT, No temporalis wasting. Ear/Nose/Throat: Hearing grossly intact, nares w/o erythema or drainage Eyes: Conjunctiva clear. Sclera non-icteric Neck: Supple.  Trachea midline Pulmonary:  Good air movement, no use of accessory muscles.  Cardiac: RRR, no JVD Vascular:  Vessel Right Left  Radial Palpable Palpable                          PT Palpable Palpable  DP Palpable Palpable   Gastrointestinal: soft, non-tender/non-distended. No guarding/reflex.  Musculoskeletal: M/S 5/5 throughout.  No deformity or atrophy. No significant LE edema. Neurologic: Sensation grossly intact in extremities.  Symmetrical.  Speech is fluent.  Psychiatric: Judgment intact, Mood & affect appropriate for pt's clinical situation. Dermatologic: No rashes or ulcers noted.  No cellulitis or open wounds.      Labs No results found for this or any previous visit (from the past 2160 hour(s)).  Radiology VAS US CAROTID  Result Date: 11/26/2021 Carotid Arterial Duplex Study Patient Name:  [name] [name]  Date of Exam:   11/25/2021 Medical Rec #: [id number]            Accession #:    [id number] Date of Birth: 1960/10/19            Patient Gender: M Patient Age:   61 years Exam Location:  [facility] Procedure:      VAS US CAROTID Referring Phys: Leotis Pain --------------------------------------------------------------------------------  Indications:       Carotid artery disease. Risk Factors:      Hypertension, hyperlipidemia, diabetes. Comparison Study:  09/17/2020 Performing Technologist: Almira Coaster RVS  Examination Guidelines: A complete evaluation includes B-mode imaging, spectral Doppler, color Doppler, and power Doppler as needed of all accessible portions of each vessel. Bilateral testing is considered an integral part of a complete examination.  Limited examinations for reoccurring indications may be performed as noted.  Right Carotid Findings: +----------+--------+--------+--------+------------------+--------+           PSV cm/sEDV cm/sStenosisPlaque DescriptionComments +----------+--------+--------+--------+------------------+--------+ CCA Prox  99      10                                         +----------+--------+--------+--------+------------------+--------+  CCA Mid   92      14                                         +----------+--------+--------+--------+------------------+--------+ CCA Distal61      10                                         +----------+--------+--------+--------+------------------+--------+ ICA Prox  41      12                                         +----------+--------+--------+--------+------------------+--------+ ICA Mid   64      16                                         +----------+--------+--------+--------+------------------+--------+ ICA Distal78      19                                         +----------+--------+--------+--------+------------------+--------+ ECA       94                                                 +----------+--------+--------+--------+------------------+--------+ +----------+--------+-------+--------+-------------------+           PSV cm/sEDV cmsDescribeArm Pressure (mmHG) +----------+--------+-------+--------+-------------------+ Subclavian140                                        +----------+--------+-------+--------+-------------------+ +---------+--------+--+--------+--+ VertebralPSV cm/s58EDV cm/s12 +---------+--------+--+--------+--+  Left Carotid Findings: +----------+--------+--------+--------+------------------+--------+           PSV cm/sEDV cm/sStenosisPlaque DescriptionComments +----------+--------+--------+--------+------------------+--------+ CCA Prox  100     17                                          +----------+--------+--------+--------+------------------+--------+ CCA Mid   101     16                                         +----------+--------+--------+--------+------------------+--------+ CCA Distal71      12                                         +----------+--------+--------+--------+------------------+--------+ ICA Prox  59      11                                         +----------+--------+--------+--------+------------------+--------+ ICA Mid   72  18                                         +----------+--------+--------+--------+------------------+--------+ ICA Distal76      21                                         +----------+--------+--------+--------+------------------+--------+ ECA       89      8                                          +----------+--------+--------+--------+------------------+--------+ +----------+--------+--------+--------+-------------------+           PSV cm/sEDV cm/sDescribeArm Pressure (mmHG) +----------+--------+--------+--------+-------------------+ [id number]                                          +----------+--------+--------+--------+-------------------+ +---------+--------+--+--------+--+ VertebralPSV cm/s61EDV cm/s12 +---------+--------+--+--------+--+   Summary: Right Carotid: Velocities in the right ICA are consistent with a 1-39% stenosis. Left Carotid: Velocities in the left ICA are consistent with a 1-39% stenosis. Vertebrals:  Bilateral vertebral arteries demonstrate antegrade flow. Subclavians: Normal flow hemodynamics were seen in bilateral subclavian              arteries. *See table(s) above for measurements and observations.  Electronically signed by Leotis Pain MD on 11/26/2021 at 8:34:08 AM.    Final    VAS [place] ABI WITH/WO TBI  Result Date: 11/26/2021  LOWER EXTREMITY DOPPLER STUDY Patient Name:  GIOVANI NEUMEISTER  Date of Exam:   11/25/2021 Medical Rec #: [id number]             Accession #:    [id number] Date of Birth: [birth year]            Patient Gender: M Patient Age:   48 years Exam Location:  [facility] Procedure:      VAS [place] ABI WITH/WO TBI Referring Phys: Leotis Pain --------------------------------------------------------------------------------  Indications: Peripheral artery disease, and 07/15/2017 PTA and stent placed in              Left SFA              08/20/2014 PTA bilat. High Risk Factors: Diabetes.  Comparison Study: 09/17/2020 Performing Technologist: Almira Coaster RVS  Examination Guidelines: A complete evaluation includes at minimum, Doppler waveform signals and systolic blood pressure reading at the level of bilateral brachial, anterior tibial, and posterior tibial arteries, when vessel segments are accessible. Bilateral testing is considered an integral part of a complete examination. Photoelectric Plethysmograph (PPG) waveforms and toe systolic pressure readings are included as required and additional duplex testing as needed. Limited examinations for reoccurring indications may be performed as noted.  ABI Findings: +---------+------------------+-----+--------+--------+ Right    Rt Pressure (mmHg)IndexWaveformComment  +---------+------------------+-----+--------+--------+ Brachial 120                                     +---------+------------------+-----+--------+--------+ ATA      100               0.70 biphasic         +---------+------------------+-----+--------+--------+  PTA      120               0.85 biphasic         +---------+------------------+-----+--------+--------+ Great Toe123               0.87 Normal           +---------+------------------+-----+--------+--------+ +---------+------------------+-----+--------+-------+ Left     Lt Pressure (mmHg)IndexWaveformComment +---------+------------------+-----+--------+-------+ Brachial 142                                     +---------+------------------+-----+--------+-------+ ATA      128               0.90 biphasic        +---------+------------------+-----+--------+-------+ PTA      128               0.90 biphasic        +---------+------------------+-----+--------+-------+ Great Toe122               0.86 Normal          +---------+------------------+-----+--------+-------+ +-------+-----------+-----------+------------+------------+ ABI/TBIToday's ABIToday's TBIPrevious ABIPrevious TBI +-------+-----------+-----------+------------+------------+ Right  .85        .87        1.25        .99          +-------+-----------+-----------+------------+------------+ Left   .90        .86        1.21        1.12         +-------+-----------+-----------+------------+------------+ Bilateral ABIs appear decreased compared to prior study on 09/17/2020. Bilateral TBIs appear decreased compared to prior study on 09/17/2020.  Summary: Right: Resting right ankle-brachial index indicates mild right lower extremity arterial disease. The right toe-brachial index is normal. Left: Resting left ankle-brachial index indicates mild left lower extremity arterial disease. The left toe-brachial index is normal. *See table(s) above for measurements and observations.  Electronically signed by Leotis Pain MD on 11/26/2021 at 8:33:59 AM.    Final     Assessment/Plan Carotid stenosis Carotid duplex reveals stable, 1 to 39% ICA stenosis bilaterally.  Doing well with no current symptoms.  On appropriate medical therapy with aspirin, statin, and Plavix.  Recheck annually.   Benign essential HTN blood pressure control important in reducing the progression of atherosclerotic disease. On appropriate oral medications.  Swelling of limb improved  Atherosclerosis of native arteries of extremity with intermittent claudication (HCC) ABIs today are 0.85 on the right and 0.90 on the left with multiphasic waveforms and good digital  pressures bilaterally.  No current symptoms.  Doing well after previous revascularizations in the past.  Continue current medical regimen.  Recheck in 1 year.    Leotis Pain, MD  11/26/2021 2:12 PM    This note was created with Dragon medical transcription system.  Any errors from dictation are purely unintentional

## 2021-11-26 NOTE — Assessment & Plan Note (Signed)
ABIs today are 0.85 on the right and 0.90 on the left with multiphasic waveforms and good digital pressures bilaterally.  No current symptoms.  Doing well after previous revascularizations in the past.  Continue current medical regimen.  Recheck in 1 year.

## 2021-12-16 ENCOUNTER — Encounter: Payer: Self-pay | Admitting: Urology

## 2021-12-17 ENCOUNTER — Other Ambulatory Visit: Payer: Self-pay | Admitting: Interventional Radiology

## 2021-12-17 DIAGNOSIS — N2889 Other specified disorders of kidney and ureter: Secondary | ICD-10-CM

## 2022-01-22 ENCOUNTER — Ambulatory Visit
Admission: RE | Admit: 2022-01-22 | Discharge: 2022-01-22 | Disposition: A | Discharge status: Home / Self Care | Payer: Managed Care, Other (non HMO) | Source: Ambulatory Visit | Attending: Interventional Radiology | Admitting: Interventional Radiology

## 2022-01-22 DIAGNOSIS — N2889 Other specified disorders of kidney and ureter: Secondary | ICD-10-CM | POA: Diagnosis present

## 2022-01-22 MED ORDER — IOHEXOL 300 MG/ML  SOLN
100.0000 mL | Freq: Once | INTRAMUSCULAR | Status: AC | PRN
  Administered 2022-01-22: 100 mL via INTRAVENOUS

## 2022-01-27 LAB — POCT I-STAT CREATININE: Creatinine, Ser: 0.9 mg/dL (ref 0.61–1.24)

## 2022-02-02 ENCOUNTER — Encounter (INDEPENDENT_AMBULATORY_CARE_PROVIDER_SITE_OTHER): Payer: Self-pay

## 2022-02-03 ENCOUNTER — Encounter: Payer: Self-pay | Admitting: *Deleted

## 2022-02-03 ENCOUNTER — Ambulatory Visit
Admission: RE | Admit: 2022-02-03 | Discharge: 2022-02-03 | Disposition: A | Discharge status: Home / Self Care | Payer: Managed Care, Other (non HMO) | Source: Ambulatory Visit | Attending: Interventional Radiology | Admitting: Interventional Radiology

## 2022-02-03 DIAGNOSIS — N2889 Other specified disorders of kidney and ureter: Secondary | ICD-10-CM

## 2022-02-03 HISTORY — PX: IR RADIOLOGIST EVAL & MGMT: IMG5224

## 2022-02-03 NOTE — Progress Notes (Signed)
Patient ID: [name] [name], male   DOB: Feb 15, 1961, 61 y.o.   MRN: [id number]       Chief Complaint:  Right renal neoplasm status post cryoablation Referring Physician(s): Dr. Bernardo Heater  History of Present Illness: [name] [name] is a 61 y.o. male with several comorbidities including chronic nephrolithiasis, hypertension, and peripheral vascular disease.  He underwent successful CT-guided cryoablation of a 1.6 cm lateral enhancing right renal mass compatible with renal cell carcinoma by imaging.  Procedure was performed 08/28/2020 at [facility].  Overall functional status remains excellent.  No physical limitations.  No current urinary tract symptoms.  [facility] surveillance [facility].  This confirms retraction of the ablation defect.  No signs of residual or recurrent disease.  No other suspicious renal abnormality.  No developing areas of nodular enhancement.  Past Medical History:  Diagnosis Date   Anemia    pernicious anemia   Cancer (HCC)    renal   Chest pain    Unspecified   Chronic kidney disease    COPD (chronic obstructive pulmonary disease) ([place])    Dysrhythmia    pac's.  okay now.   GERD (gastroesophageal reflux disease)    History of kidney stones    Hypertension    Hypertriglyceridemia    Kidney stone on left side    2018   Palpitations    Peripheral vascular disease ([place]) 2019   Pre-diabetes    Renal insufficiency    Renal mass    Thyroid nodule 2019   being monitored    Past Surgical History:  Procedure Laterality Date   COLONOSCOPY N/A 01/23/2021   Procedure: COLONOSCOPY;  Surgeon: Lucilla Lame, MD;  Location: [facility] ENDOSCOPY;  Service: Endoscopy;  Laterality: N/A;   CYSTOSCOPY W/ RETROGRADES Bilateral 03/23/2017   Procedure: CYSTOSCOPY WITH RETROGRADE PYELOGRAM;  Surgeon: Abbie Sons, MD;  Location: [facility] ORS;  Service: Urology;  Laterality: Bilateral;   CYSTOSCOPY W/ URETERAL STENT PLACEMENT Left  03/31/2017   Procedure: CYSTOSCOPY WITH STENT REPLACEMENT;  Surgeon: Abbie Sons, MD;  Location: [facility] ORS;  Service: Urology;  Laterality: Left;   CYSTOSCOPY/RETROGRADE/URETEROSCOPY/STONE EXTRACTION WITH BASKET Left 03/31/2017   Procedure: CYSTOSCOPY/RETROGRADE/URETEROSCOPY/STONE EXTRACTION WITH BASKET;  Surgeon: Abbie Sons, MD;  Location: [facility] ORS;  Service: Urology;  Laterality: Left;   CYSTOSCOPY/URETEROSCOPY/HOLMIUM LASER/STENT PLACEMENT Left 03/23/2017   Procedure: CYSTOSCOPY/URETEROSCOPY/HOLMIUM LASER/STENT PLACEMENT;  Surgeon: Abbie Sons, MD;  Location: [facility] ORS;  Service: Urology;  Laterality: Left;   ESOPHAGOGASTRODUODENOSCOPY (EGD) WITH PROPOFOL N/A 10/22/2015   Procedure: ESOPHAGOGASTRODUODENOSCOPY (EGD) WITH PROPOFOL;  Surgeon: Lucilla Lame, MD;  Location: [facility] ENDOSCOPY;  Service: Endoscopy;  Laterality: N/A;   EXTRACORPOREAL SHOCK WAVE LITHOTRIPSY  2016   EXTRACORPOREAL SHOCK WAVE LITHOTRIPSY Left 09/13/2014   Procedure: EXTRACORPOREAL SHOCK WAVE LITHOTRIPSY (ESWL);  Surgeon: Hollice Espy, MD;  Location: [facility] ORS;  Service: Urology;  Laterality: Left;   HERNIA REPAIR     IR RADIOLOGIST EVAL & MGMT  08/01/2020   IR RADIOLOGIST EVAL & MGMT  09/17/2020   IR RADIOLOGIST EVAL & MGMT  01/21/2021   LOWER EXTREMITY ANGIOGRAPHY Left 07/15/2017   Procedure: LOWER EXTREMITY ANGIOGRAPHY;  Surgeon: Algernon Huxley, MD;  Location: [place] CV LAB;  Service: Cardiovascular;  Laterality: Left;   lower extremity interventions x2 right leg Right    stent   LYSIS OF ADHESION N/A 05/09/2021   Procedure: LYSIS OF ADHESION;  Surgeon: Archana Eckman Boston, MD;  Location: [facility] ORS;  Service: General;  Laterality: N/A;  PERIPHERAL VASCULAR CATHETERIZATION Right 08/20/2014   Procedure: Lower Extremity Angiography;  Surgeon: Algernon Huxley, MD;  Location: [place] CV LAB;  Service: Cardiovascular;  Laterality: Right;   PERIPHERAL VASCULAR CATHETERIZATION Right 08/20/2014   Procedure: Lower Extremity  Intervention;  Surgeon: Algernon Huxley, MD;  Location: [place] CV LAB;  Service: Cardiovascular;  Laterality: Right;   RADIOLOGY WITH ANESTHESIA N/A 08/28/2020   Procedure: RADIOLOGY WITH ANESTHESIA CT WITH CRYOABLATION;  Surgeon: Greggory Keen, MD;  Location: [facility] ORS;  Service: Anesthesiology;  Laterality: N/A;   VENTRAL HERNIA REPAIR  08/11/2019   Procedure: HERNIA REPAIR VENTRAL ADULT WITH REPAIR OF LARGE INTESTINE LACERATION;  Surgeon: Herbert Pun, MD;  Location: [facility] ORS;  Service: General;;   VENTRAL HERNIA REPAIR N/A 05/09/2021   Procedure: LAPAROSCOPIC VENTRAL HERNIA REPAIR WITH MESH WITH TAP BLOCK;  Surgeon: Mariano Doshi Boston, MD;  Location: [facility]S;  Service: General;  Laterality: N/A;   XI ROBOTIC ASSISTED VENTRAL HERNIA N/A 08/11/2019   Procedure: XI ROBOTIC ASSISTED VENTRAL HERNIA;  Surgeon: Herbert Pun, MD;  Location: [facility] ORS;  Service: General;  Laterality: N/A;  Converted to open procedure    Allergies: Patient has no known allergies.  Medications: Prior to Admission medications   Medication Sig Start Date End Date Taking? Authorizing Provider  amLODipine (NORVASC) 10 MG tablet Take 10 mg by mouth daily. 05/06/17   [provider]  aspirin EC 81 MG tablet Take 81 mg by mouth daily.    [provider]  clopidogrel (PLAVIX) 75 MG tablet Take 75 mg by mouth daily.    [provider]  gabapentin (NEURONTIN) 300 MG capsule Take 1 capsule (300 mg total) by mouth 3 (three) times daily. 05/10/21   Timoty Bourke Boston, MD  metoprolol (TOPROL-XL) 200 MG 24 hr tablet Take 200 mg by mouth daily.     [provider]  Multiple Vitamin (MULTIVITAMIN WITH MINERALS) TABS tablet Take 1 tablet by mouth daily.    [provider]  omega-3 acid ethyl esters (LOVAZA) 1 G capsule Take 4 g by mouth daily.     [provider]  pantoprazole (PROTONIX) 40 MG tablet Take 40 mg by mouth daily.    [provider]  rosuvastatin (CRESTOR) 40  MG tablet Take 40 mg by mouth daily.     [provider]     Family History  Problem Relation Age of Onset   Heart failure Other     Social History   Socioeconomic History   Marital status: Married    Spouse name: Not on file   Number of children: Not on file   Years of education: Not on file   Highest education level: Not on file  Occupational History   Occupation: Full Time  Tobacco Use   Smoking status: Former    Packs/day: 1.00    Years: 37.00    Total pack years: 37.00    Types: Cigarettes    Quit date: 04/21/2013    Years since quitting: 8.7   Smokeless tobacco: Never  Vaping Use   Vaping Use: Never used  Substance and Sexual Activity   Alcohol use: Yes    Alcohol/week: 14.0 standard drinks of alcohol    Types: 7 Glasses of wine, 7 Cans of beer per week    Comment: 2 drinks daily   Drug use: No   Sexual activity: Yes  Other Topics Concern   Not on file  Social History Narrative   Regular exercise: No   Social  Determinants of Health   Financial Resource Strain: Not on file  Food Insecurity: Not on file  Transportation Needs: Not on file  Physical Activity: Not on file  Stress: Not on file  Social Connections: Not on file     Review of Systems  Review of Systems: A 12 point ROS discussed and pertinent positives are indicated in the HPI above.  All other systems are negative.    Physical Exam No direct physical exam was performed, telephone health visit only today to review surveillance imaging.   Vital Signs: There were no vitals taken for this visit.  Imaging: CT ABDOMEN W WO CONTRAST  Result Date: 01/24/2022 CLINICAL DATA:  Follow-up right renal cell carcinoma. Status post cryoablation. EXAM: CT ABDOMEN WITHOUT AND WITH CONTRAST TECHNIQUE: Multidetector CT imaging of the abdomen was performed following the standard protocol before and following the bolus administration of intravenous contrast. RADIATION DOSE REDUCTION: This exam was  performed according to the departmental dose-optimization program which includes automated exposure control, adjustment of the mA and/or kV according to patient size and/or use of iterative reconstruction technique. CONTRAST:  157m OMNIPAQUE IOHEXOL 300 MG/ML  SOLN COMPARISON:  01/17/2021 FINDINGS: Lower chest: No acute findings. Hepatobiliary: No hepatic masses identified. Gallbladder is unremarkable. No evidence of biliary ductal dilatation. Pancreas:  No mass or inflammatory changes. Spleen:  Within normal limits in size and appearance. Adrenals/Urinary Tract: Few tiny renal calculi are again seen bilaterally measuring up to 5 mm. No evidence of hydronephrosis. Decreased size of cryoablation defect is seen in the lateral midpole of the right kidney since previous study. No evidence of residual or recurrent renal mass. A few bilateral benign-appearing sub-cm renal cysts remain stable. No other renal masses are identified. Stomach/Bowel: Unremarkable. Vascular/Lymphatic: No pathologically enlarged lymph nodes identified. No acute vascular findings. Aortic atherosclerotic calcification incidentally noted. Other:  None. Musculoskeletal:  No suspicious bone lesions identified. IMPRESSION: Further decreased size of cryoablation defect in right kidney. No evidence of residual or recurrent renal mass. No evidence of abdominal metastatic disease. Bilateral nephrolithiasis. No evidence of hydronephrosis. Aortic Atherosclerosis (ICD10-I70.0). Electronically Signed   By: JMarlaine HindM.D.   On: 01/24/2022 12:40    Labs:  CBC: Recent Labs    05/06/21 0927  WBC 7.6  HGB 14.6  HCT 43.8  PLT 142*    COAGS: No results for input(s): "INR", "APTT" in the last 8760 hours.  BMP: Recent Labs    01/22/22 1520  CREATININE 0.90    LIVER FUNCTION TESTS: No results for input(s): "BILITOT", "AST", "ALT", "ALKPHOS", "PROT", "ALBUMIN" in the last 8760 hours.  Assessment and Plan:  17 months status post right  renal neoplasm [facility]ablation [facility]  Surveillance imaging confirms further retraction of the ablation defect with no signs of residual or recurrent disease.  No suspicious nodular or marginal enhancement.  No new renal abnormality.  No additional urinary tract symptoms.  Stable nonobstructing nephrolithiasis predominant in the left kidney lower pole measuring 6 mm.  Plan: Continue surveillance imaging in 12 months with [facility]     Electronically Signed: MGreggory Keen10/31/2023, 9:47 AM   I spent a total of    40 Minutes in remote  clinical consultation, greater than 50% of which was counseling/coordinating care for this patient status post renal cryoablation.    Visit type: Audio only (telephone). Audio (no video) only due to patient's lack of internet/smartphone capability. Alternative for in-person consultation at  [facility], [address], [place], [place]. This visit type was conducted due to national recommendations for restrictions regarding the COVID-19 Pandemic (e.g. social distancing).  This format is felt to be most appropriate for this patient at this time.  All issues noted in this document were discussed and addressed.

## 2022-07-03 ENCOUNTER — Other Ambulatory Visit: Payer: Self-pay | Admitting: Interventional Radiology

## 2022-07-03 DIAGNOSIS — N2889 Other specified disorders of kidney and ureter: Secondary | ICD-10-CM

## 2022-07-07 ENCOUNTER — Other Ambulatory Visit: Payer: Self-pay | Admitting: Interventional Radiology

## 2022-07-10 ENCOUNTER — Other Ambulatory Visit: Payer: Managed Care, Other (non HMO)

## 2022-07-15 ENCOUNTER — Telehealth: Payer: Managed Care, Other (non HMO)

## 2022-11-26 ENCOUNTER — Other Ambulatory Visit (INDEPENDENT_AMBULATORY_CARE_PROVIDER_SITE_OTHER): Payer: Self-pay | Admitting: Vascular Surgery

## 2022-11-26 DIAGNOSIS — I6523 Occlusion and stenosis of bilateral carotid arteries: Secondary | ICD-10-CM

## 2022-11-26 DIAGNOSIS — Z9889 Other specified postprocedural states: Secondary | ICD-10-CM

## 2022-12-01 ENCOUNTER — Ambulatory Visit (INDEPENDENT_AMBULATORY_CARE_PROVIDER_SITE_OTHER): Payer: Managed Care, Other (non HMO)

## 2022-12-01 ENCOUNTER — Encounter (INDEPENDENT_AMBULATORY_CARE_PROVIDER_SITE_OTHER): Payer: Self-pay | Admitting: Vascular Surgery

## 2022-12-01 ENCOUNTER — Ambulatory Visit (INDEPENDENT_AMBULATORY_CARE_PROVIDER_SITE_OTHER): Payer: Managed Care, Other (non HMO) | Admitting: Vascular Surgery

## 2022-12-01 VITALS — BP 105/63 | HR 76 | Resp 16 | Wt 207.4 lb

## 2022-12-01 DIAGNOSIS — I739 Peripheral vascular disease, unspecified: Secondary | ICD-10-CM

## 2022-12-01 DIAGNOSIS — Z9889 Other specified postprocedural states: Secondary | ICD-10-CM | POA: Diagnosis not present

## 2022-12-01 DIAGNOSIS — I6523 Occlusion and stenosis of bilateral carotid arteries: Secondary | ICD-10-CM

## 2022-12-01 DIAGNOSIS — I70213 Atherosclerosis of native arteries of extremities with intermittent claudication, bilateral legs: Secondary | ICD-10-CM | POA: Diagnosis not present

## 2022-12-01 DIAGNOSIS — I10 Essential (primary) hypertension: Secondary | ICD-10-CM

## 2022-12-01 DIAGNOSIS — M7989 Other specified soft tissue disorders: Secondary | ICD-10-CM | POA: Diagnosis not present

## 2022-12-01 NOTE — Assessment & Plan Note (Signed)
Carotid duplex demonstrates 1 to 39% right ICA stenosis with near normal findings in the left carotid artery.  He is on aspirin, Plavix, and a statin agent.  Recheck in 1 year with duplex.

## 2022-12-01 NOTE — Progress Notes (Signed)
MRN : [id number]  [name] [name] is a 62 y.o. (Oct 02, 1960) male who presents with chief complaint of  Chief Complaint  Patient presents with   Follow-up    Ultrasound follow up  .  History of Present Illness: Patient returns today in follow up of multiple vascular issues.  He is doing well today.  He has undergone bilateral lower extremity revascularizations in the past, but not in the past 5 years.  He is not currently having any lifestyle limiting claudication, ischemic rest pain, or ulceration.  He also has previously had some leg swelling but that has resolved and not recurred.  ABIs today are 1.07 on the right and 1.03 on the left with triphasic waveforms and normal digital pressures. He is also followed for carotid disease.  He has done well without any focal neurologic symptoms. Specifically, the patient denies amaurosis fugax, speech or swallowing difficulties, or arm or leg weakness or numbness. Carotid duplex demonstrates 1 to 39% right ICA stenosis with near normal findings in the left carotid artery.    Current Outpatient Medications  Medication Sig Dispense Refill   amLODipine (NORVASC) 10 MG tablet Take 10 mg by mouth daily.     aspirin EC 81 MG tablet Take 81 mg by mouth daily.     clopidogrel (PLAVIX) 75 MG tablet Take 75 mg by mouth daily.     gabapentin (NEURONTIN) 300 MG capsule Take 1 capsule (300 mg total) by mouth 3 (three) times daily. 60 capsule 2   metoprolol (TOPROL-XL) 200 MG 24 hr tablet Take 200 mg by mouth daily.      MOUNJARO 10 MG/0.5ML Pen Inject 10 mg into the skin once a week.     Multiple Vitamin (MULTIVITAMIN WITH MINERALS) TABS tablet Take 1 tablet by mouth daily.     omega-3 acid ethyl esters (LOVAZA) 1 G capsule Take 4 g by mouth daily.      pantoprazole (PROTONIX) 40 MG tablet Take 40 mg by mouth daily.     rosuvastatin (CRESTOR) 40 MG tablet Take 40 mg by mouth daily.      No current facility-administered medications for this visit.     Past Medical History:  Diagnosis Date   Anemia    pernicious anemia   Cancer (HCC)    renal   Chest pain    Unspecified   Chronic kidney disease    COPD (chronic obstructive pulmonary disease) (HCC)    Dysrhythmia    pac's.  okay now.   GERD (gastroesophageal reflux disease)    History of kidney stones    Hypertension    Hypertriglyceridemia    Kidney stone on left side    2018   Palpitations    Peripheral vascular disease (HCC) 2019   Pre-diabetes    Renal insufficiency    Renal mass    Thyroid nodule 2019   being monitored    Past Surgical History:  Procedure Laterality Date   COLONOSCOPY N/A 01/23/2021   Procedure: COLONOSCOPY;  Surgeon: Midge Minium, MD;  Location: [facility] ENDOSCOPY;  Service: Endoscopy;  Laterality: N/A;   CYSTOSCOPY W/ RETROGRADES Bilateral 03/23/2017   Procedure: CYSTOSCOPY WITH RETROGRADE PYELOGRAM;  Surgeon: Riki Altes, MD;  Location: [facility] ORS;  Service: Urology;  Laterality: Bilateral;   CYSTOSCOPY W/ URETERAL STENT PLACEMENT Left 03/31/2017   Procedure: CYSTOSCOPY WITH STENT REPLACEMENT;  Surgeon: Riki Altes, MD;  Location: [facility] ORS;  Service: Urology;  Laterality: Left;   CYSTOSCOPY/RETROGRADE/URETEROSCOPY/STONE EXTRACTION WITH BASKET Left 03/31/2017  Procedure: CYSTOSCOPY/RETROGRADE/URETEROSCOPY/STONE EXTRACTION WITH BASKET;  Surgeon: Riki Altes, MD;  Location: [facility] ORS;  Service: Urology;  Laterality: Left;   CYSTOSCOPY/URETEROSCOPY/HOLMIUM LASER/STENT PLACEMENT Left 03/23/2017   Procedure: CYSTOSCOPY/URETEROSCOPY/HOLMIUM LASER/STENT PLACEMENT;  Surgeon: Riki Altes, MD;  Location: [facility] ORS;  Service: Urology;  Laterality: Left;   ESOPHAGOGASTRODUODENOSCOPY (EGD) WITH PROPOFOL N/A 10/22/2015   Procedure: ESOPHAGOGASTRODUODENOSCOPY (EGD) WITH PROPOFOL;  Surgeon: Midge Minium, MD;  Location: [facility] ENDOSCOPY;  Service: Endoscopy;  Laterality: N/A;   EXTRACORPOREAL SHOCK WAVE LITHOTRIPSY  2016   EXTRACORPOREAL SHOCK WAVE  LITHOTRIPSY Left 09/13/2014   Procedure: EXTRACORPOREAL SHOCK WAVE LITHOTRIPSY (ESWL);  Surgeon: Vanna Scotland, MD;  Location: [facility] ORS;  Service: Urology;  Laterality: Left;   HERNIA REPAIR     IR RADIOLOGIST EVAL & MGMT  08/01/2020   IR RADIOLOGIST EVAL & MGMT  09/17/2020   IR RADIOLOGIST EVAL & MGMT  01/21/2021   IR RADIOLOGIST EVAL & MGMT  02/03/2022   LOWER EXTREMITY ANGIOGRAPHY Left 07/15/2017   Procedure: LOWER EXTREMITY ANGIOGRAPHY;  Surgeon: Annice Needy, MD;  Location: [facility] INVASIVE CV LAB;  Service: Cardiovascular;  Laterality: Left;   lower extremity interventions x2 right leg Right    stent   LYSIS OF ADHESION N/A 05/09/2021   Procedure: LYSIS OF ADHESION;  Surgeon: Karie Soda, MD;  Location: [facility];  Service: General;  Laterality: N/A;   PERIPHERAL VASCULAR CATHETERIZATION Right 08/20/2014   Procedure: Lower Extremity Angiography;  Surgeon: Annice Needy, MD;  Location: [facility] INVASIVE CV LAB;  Service: Cardiovascular;  Laterality: Right;   PERIPHERAL VASCULAR CATHETERIZATION Right 08/20/2014   Procedure: Lower Extremity Intervention;  Surgeon: Annice Needy, MD;  Location: [facility] INVASIVE CV LAB;  Service: Cardiovascular;  Laterality: Right;   RADIOLOGY WITH ANESTHESIA N/A 08/28/2020   Procedure: RADIOLOGY WITH ANESTHESIA CT WITH CRYOABLATION;  Surgeon: Berdine Dance, MD;  Location: [facility];  Service: Anesthesiology;  Laterality: N/A;   VENTRAL HERNIA REPAIR  08/11/2019   Procedure: HERNIA REPAIR VENTRAL ADULT WITH REPAIR OF LARGE INTESTINE LACERATION;  Surgeon: Carolan Shiver, MD;  Location: [facility] ORS;  Service: General;;   VENTRAL HERNIA REPAIR N/A 05/09/2021   Procedure: LAPAROSCOPIC VENTRAL HERNIA REPAIR WITH MESH WITH TAP BLOCK;  Surgeon: Karie Soda, MD;  [facility];  Service: General;  Laterality: N/A;   XI ROBOTIC ASSISTED VENTRAL HERNIA N/A 08/11/2019   Procedure: XI ROBOTIC ASSISTED VENTRAL HERNIA;  Surgeon: Carolan Shiver, MD;  Location: [facility] ORS;  Service: General;   Laterality: N/A;  Converted to open procedure     Social History   Tobacco Use   Smoking status: Former    Current packs/day: 0.00    Average packs/day: 1 pack/day for 37.0 years (37.0 ttl pk-yrs)    Types: Cigarettes    Start date: 04/21/1976    Quit date: 04/21/2013    Years since quitting: 9.6   Smokeless tobacco: Never  Vaping Use   Vaping status: Never Used  Substance Use Topics   Alcohol use: Yes    Alcohol/week: 14.0 standard drinks of alcohol    Types: 7 Glasses of wine, 7 Cans of beer per week    Comment: 2 drinks daily   Drug use: No       Family History  Problem Relation Age of Onset   Heart failure Other   No bleeding or clotting disorders No aneurysms  No Known Allergies  REVIEW OF SYSTEMS (Negative unless checked)   Constitutional: [] Weight loss  [] Fever  [] Chills Cardiac: [] Chest pain   []   Chest pressure   [] Palpitations   [] Shortness of breath when laying flat   [] Shortness of breath at rest   [] Shortness of breath with exertion. Vascular:  [x] Pain in legs with walking   [] Pain in legs at rest   [] Pain in legs when laying flat   [] Claudication   [] Pain in feet when walking  [] Pain in feet at rest  [] Pain in feet when laying flat   [] History of DVT   [] Phlebitis   [] Swelling in legs   [] Varicose veins   [] Non-healing ulcers Pulmonary:   [] Uses home oxygen   [] Productive cough   [] Hemoptysis   [] Wheeze  [] COPD   [] Asthma Neurologic:  [] Dizziness  [] Blackouts   [] Seizures   [] History of stroke   [] History of TIA  [] Aphasia   [] Temporary blindness   [] Dysphagia   [] Weakness or numbness in arms   [] Weakness or numbness in legs Musculoskeletal:  [x] Arthritis   [] Joint swelling   [] Joint pain   [] Low back pain Hematologic:  [] Easy bruising  [] Easy bleeding   [] Hypercoagulable state   [x] Anemic   Gastrointestinal:  [] Blood in stool   [] Vomiting blood  [x] Gastroesophageal reflux/heartburn   [] Abdominal pain Genitourinary:  [x] Chronic kidney disease   [] Difficult  urination  [] Frequent urination  [] Burning with urination   [] Hematuria Skin:  [] Rashes   [] Ulcers   [] Wounds Psychological:  [] History of anxiety   []  History of major depression.   Physical Examination  BP 105/63 (BP Location: Left Arm)   Pulse 76   Resp 16   Wt 207 lb 6.4 oz (94.1 kg)   BMI 28.13 kg/m  Gen:  WD/WN, NAD Head: [place]/AT, No temporalis wasting. Ear/Nose/Throat: Hearing grossly intact, nares w/o erythema or drainage Eyes: Conjunctiva clear. Sclera non-icteric Neck: Supple.  Trachea midline Pulmonary:  Good air movement, no use of accessory muscles.  Cardiac: RRR, no JVD Vascular:  Vessel Right Left  Radial Palpable Palpable                          PT Palpable Palpable  DP Palpable Palpable   Gastrointestinal: soft, non-tender/non-distended. No guarding/reflex.  Musculoskeletal: M/S 5/5 throughout.  No deformity or atrophy.  No appreciable edema. Neurologic: Sensation grossly intact in extremities.  Symmetrical.  Speech is fluent.  Psychiatric: Judgment intact, Mood & affect appropriate for pt's clinical situation. Dermatologic: No rashes or ulcers noted.  No cellulitis or open wounds.      Labs No results found for this or any previous visit (from the past 2160 hour(s)).  Radiology No results found.  Assessment/Plan Benign essential HTN blood pressure control important in reducing the progression of atherosclerotic disease. On appropriate oral medications.   Swelling of limb Improved and essentially resolved at this point.  Atherosclerosis of native arteries of extremity with intermittent claudication (HCC) ABIs today are 1.07 on the right and 1.03 on the left with triphasic waveforms and normal digital pressures.  Doing well after revascularization several years ago.  Continue current medical regimen.  Recheck in 1 year  Carotid stenosis Carotid duplex demonstrates 1 to 39% right ICA stenosis with near normal findings in the left carotid  artery.  He is on aspirin, Plavix, and a statin agent.  Recheck in 1 year with duplex.    Festus Barren, MD  12/01/2022 2:49 PM    This note was created with Dragon medical transcription system.  Any errors from dictation are purely unintentional

## 2022-12-01 NOTE — Assessment & Plan Note (Signed)
ABIs today are 1.07 on the right and 1.03 on the left with triphasic waveforms and normal digital pressures.  Doing well after revascularization several years ago.  Continue current medical regimen.  Recheck in 1 year

## 2022-12-08 LAB — VAS US ABI WITH/WO TBI
Left ABI: 1.03
Right ABI: 1.07

## 2023-06-08 ENCOUNTER — Ambulatory Visit: Payer: Managed Care, Other (non HMO) | Admitting: Podiatry

## 2023-06-16 ENCOUNTER — Encounter: Payer: Self-pay | Admitting: Ophthalmology

## 2023-06-16 ENCOUNTER — Ambulatory Visit: Admitting: Podiatry

## 2023-06-16 ENCOUNTER — Encounter: Payer: Self-pay | Admitting: Podiatry

## 2023-06-16 DIAGNOSIS — L84 Corns and callosities: Secondary | ICD-10-CM

## 2023-06-16 DIAGNOSIS — B351 Tinea unguium: Secondary | ICD-10-CM | POA: Diagnosis not present

## 2023-06-16 DIAGNOSIS — M79676 Pain in unspecified toe(s): Secondary | ICD-10-CM | POA: Diagnosis not present

## 2023-06-16 DIAGNOSIS — M14679 Charcot's joint, unspecified ankle and foot: Secondary | ICD-10-CM

## 2023-06-16 NOTE — Anesthesia Preprocedure Evaluation (Addendum)
 Anesthesia Evaluation  Patient identified by MRN, date of birth, ID band Patient awake    Reviewed: Allergy & Precautions, NPO status , Patient's Chart, lab work & pertinent test results  History of Anesthesia Complications Negative for: history of anesthetic complications  Airway Mallampati: III   Neck ROM: Full    Dental   Bridge :   Pulmonary COPD, former smoker (quit 2015)   Pulmonary exam normal breath sounds clear to auscultation       Cardiovascular hypertension, + CAD and + Peripheral Vascular Disease (carotid stenosis; LE stents on Plavix)  Normal cardiovascular exam Rhythm:Regular Rate:Normal     Neuro/Psych negative neurological ROS     GI/Hepatic ,GERD  ,,  Endo/Other  diabetes, Type 2    Renal/GU Renal disease (stage III CKD; nephrolithiasis; renal cancer)     Musculoskeletal  (+) Arthritis ,    Abdominal   Peds  Hematology  (+) Blood dyscrasia, anemia   Anesthesia Other Findings   Reproductive/Obstetrics                             Anesthesia Physical Anesthesia Plan  ASA: 3  Anesthesia Plan: MAC   Post-op Pain Management:    Induction: Intravenous  PONV Risk Score and Plan: 1 and Treatment may vary due to age or medical condition, Midazolam and TIVA  Airway Management Planned: Natural Airway and Nasal Cannula  Additional Equipment:   Intra-op Plan:   Post-operative Plan:   Informed Consent: I have reviewed the patients History and Physical, chart, labs and discussed the procedure including the risks, benefits and alternatives for the proposed anesthesia with the patient or authorized representative who has indicated his/her understanding and acceptance.     Dental advisory given  Plan Discussed with: CRNA  Anesthesia Plan Comments: (LMA/GETA backup discussed.  Patient consented for risks of anesthesia including but not limited to:  - adverse reactions  to medications - damage to eyes, teeth, lips or other oral mucosa - nerve damage due to positioning  - sore throat or hoarseness - damage to heart, brain, nerves, lungs, other parts of body or loss of life  Informed patient about role of CRNA in peri- and intra-operative care.  Patient voiced understanding.)       Anesthesia Quick Evaluation

## 2023-06-16 NOTE — Progress Notes (Signed)
 He presents today for a follow-up of his Charcot arthropathy and his [place] brace.  He has noticed that he has developed a small spot of blood underneath the collapsed medial foot from Charcot arthropathy.  He states that the [place] brace has worked markedly well and he would like for [place] to take a look at the brace to see if he needs a new one.  He is also complaining about his painful elongated toenails.  Objective: Vital signs are stable he is alert oriented x 3.  Pulses are palpable.  Neurologic sensorium is diminished no new Charcot deformities are noted.  No open lesions or wounds are noted.  Toenails are long thick yellow dystrophic clinically mycotic.  Assessment: Pain in limb secondary to onychomycosis Charcot deformity left.  Diabetic peripheral neuropathy.  Digital deformities.  Plan: Discussed etiology pathology conservative surgical therapies at this point we debrided his toenails 1 through 5 bilateral foot.  I do believe since we have seen the breakdown in the skin that has healed that we probably need to consider a new [place] brace.  We are going to request that he follow-up with Trish for [facility] brace fabrication.  He will bring his brace with him to the [place] office to be casted.

## 2023-06-23 NOTE — Discharge Instructions (Signed)

## 2023-06-24 ENCOUNTER — Other Ambulatory Visit: Payer: Self-pay

## 2023-06-24 ENCOUNTER — Ambulatory Visit: Payer: Self-pay | Admitting: Anesthesiology

## 2023-06-24 ENCOUNTER — Ambulatory Visit
Admission: RE | Admit: 2023-06-24 | Discharge: 2023-06-24 | Disposition: A | Discharge status: Home / Self Care | Attending: Ophthalmology | Admitting: Ophthalmology

## 2023-06-24 ENCOUNTER — Encounter: Payer: Self-pay | Admitting: Ophthalmology

## 2023-06-24 ENCOUNTER — Encounter
Admission: RE | Disposition: A | Discharge status: Home / Self Care | Payer: Self-pay | Source: Home / Self Care | Attending: Ophthalmology

## 2023-06-24 DIAGNOSIS — Z955 Presence of coronary angioplasty implant and graft: Secondary | ICD-10-CM | POA: Insufficient documentation

## 2023-06-24 DIAGNOSIS — E1122 Type 2 diabetes mellitus with diabetic chronic kidney disease: Secondary | ICD-10-CM | POA: Diagnosis not present

## 2023-06-24 DIAGNOSIS — I2510 Atherosclerotic heart disease of native coronary artery without angina pectoris: Secondary | ICD-10-CM | POA: Diagnosis not present

## 2023-06-24 DIAGNOSIS — D631 Anemia in chronic kidney disease: Secondary | ICD-10-CM | POA: Insufficient documentation

## 2023-06-24 DIAGNOSIS — N183 Chronic kidney disease, stage 3 unspecified: Secondary | ICD-10-CM | POA: Insufficient documentation

## 2023-06-24 DIAGNOSIS — Z87891 Personal history of nicotine dependence: Secondary | ICD-10-CM | POA: Insufficient documentation

## 2023-06-24 DIAGNOSIS — E1136 Type 2 diabetes mellitus with diabetic cataract: Secondary | ICD-10-CM | POA: Insufficient documentation

## 2023-06-24 DIAGNOSIS — K219 Gastro-esophageal reflux disease without esophagitis: Secondary | ICD-10-CM | POA: Diagnosis not present

## 2023-06-24 DIAGNOSIS — I129 Hypertensive chronic kidney disease with stage 1 through stage 4 chronic kidney disease, or unspecified chronic kidney disease: Secondary | ICD-10-CM | POA: Diagnosis not present

## 2023-06-24 DIAGNOSIS — M1990 Unspecified osteoarthritis, unspecified site: Secondary | ICD-10-CM | POA: Diagnosis not present

## 2023-06-24 DIAGNOSIS — J449 Chronic obstructive pulmonary disease, unspecified: Secondary | ICD-10-CM | POA: Diagnosis not present

## 2023-06-24 DIAGNOSIS — H2512 Age-related nuclear cataract, left eye: Secondary | ICD-10-CM | POA: Insufficient documentation

## 2023-06-24 HISTORY — DX: Occlusion and stenosis of bilateral carotid arteries: I65.23

## 2023-06-24 HISTORY — DX: Emphysema, unspecified: J43.9

## 2023-06-24 HISTORY — DX: Other specified postprocedural states: Z98.890

## 2023-06-24 HISTORY — DX: Atherosclerotic heart disease of native coronary artery without angina pectoris: I25.10

## 2023-06-24 HISTORY — PX: CATARACT EXTRACTION W/PHACO: SHX586

## 2023-06-24 HISTORY — DX: Chronic kidney disease, stage 3 unspecified: N18.30

## 2023-06-24 HISTORY — DX: Type 2 diabetes mellitus with unspecified complications: E11.8

## 2023-06-24 HISTORY — DX: Atherosclerosis of native arteries of extremities with intermittent claudication, bilateral legs: I70.213

## 2023-06-24 HISTORY — DX: Peripheral vascular disease, unspecified: I73.9

## 2023-06-24 LAB — GLUCOSE, CAPILLARY: Glucose-Capillary: 128 mg/dL — ABNORMAL HIGH (ref 70–99)

## 2023-06-24 SURGERY — PHACOEMULSIFICATION, CATARACT, WITH IOL INSERTION
Anesthesia: Monitor Anesthesia Care | Laterality: Left

## 2023-06-24 MED ORDER — BRIMONIDINE TARTRATE-TIMOLOL 0.2-0.5 % OP SOLN
OPHTHALMIC | Status: DC | PRN
  Administered 2023-06-24: 1 [drp] via OPHTHALMIC

## 2023-06-24 MED ORDER — PHENYLEPHRINE-KETOROLAC 1-0.3 % IO SOLN
INTRAOCULAR | Status: DC | PRN
  Administered 2023-06-24: 75 mL via OPHTHALMIC

## 2023-06-24 MED ORDER — ARMC OPHTHALMIC DILATING DROPS
1.0000 | OPHTHALMIC | Status: DC | PRN
  Administered 2023-06-24 (×3): 1 via OPHTHALMIC

## 2023-06-24 MED ORDER — MOXIFLOXACIN HCL 0.5 % OP SOLN
OPHTHALMIC | Status: DC | PRN
  Administered 2023-06-24: .2 mL via OPHTHALMIC

## 2023-06-24 MED ORDER — MIDAZOLAM HCL 2 MG/2ML IJ SOLN
INTRAMUSCULAR | Status: AC
  Filled 2023-06-24: qty 2

## 2023-06-24 MED ORDER — LIDOCAINE HCL (PF) 2 % IJ SOLN
INTRAOCULAR | Status: DC | PRN
  Administered 2023-06-24: 1 mL via INTRAOCULAR

## 2023-06-24 MED ORDER — TETRACAINE HCL 0.5 % OP SOLN
OPHTHALMIC | Status: AC
  Filled 2023-06-24: qty 4

## 2023-06-24 MED ORDER — FENTANYL CITRATE (PF) 100 MCG/2ML IJ SOLN
INTRAMUSCULAR | Status: AC
  Filled 2023-06-24: qty 2

## 2023-06-24 MED ORDER — SIGHTPATH DOSE#1 BSS IO SOLN
INTRAOCULAR | Status: DC | PRN
  Administered 2023-06-24: 15 mL via INTRAOCULAR

## 2023-06-24 MED ORDER — ARMC OPHTHALMIC DILATING DROPS
OPHTHALMIC | Status: AC
  Filled 2023-06-24: qty 0.5

## 2023-06-24 MED ORDER — MIDAZOLAM HCL 2 MG/2ML IJ SOLN
INTRAMUSCULAR | Status: DC | PRN
  Administered 2023-06-24: 2 mg via INTRAVENOUS

## 2023-06-24 MED ORDER — SIGHTPATH DOSE#1 NA HYALUR & NA CHOND-NA HYALUR IO KIT
PACK | INTRAOCULAR | Status: DC | PRN
  Administered 2023-06-24: 1 via OPHTHALMIC

## 2023-06-24 MED ORDER — FENTANYL CITRATE (PF) 100 MCG/2ML IJ SOLN
INTRAMUSCULAR | Status: DC | PRN
  Administered 2023-06-24: 100 ug via INTRAVENOUS

## 2023-06-24 MED ORDER — TETRACAINE HCL 0.5 % OP SOLN
1.0000 [drp] | OPHTHALMIC | Status: DC | PRN
  Administered 2023-06-24 (×3): 1 [drp] via OPHTHALMIC

## 2023-06-24 SURGICAL SUPPLY — 13 items
CATARACT SUITE SIGHTPATH (MISCELLANEOUS) ×1 IMPLANT
DISSECTOR HYDRO NUCLEUS 50X22 (MISCELLANEOUS) ×1 IMPLANT
DRSG TEGADERM 2-3/8X2-3/4 SM (GAUZE/BANDAGES/DRESSINGS) ×1 IMPLANT
FEE CATARACT SUITE SIGHTPATH (MISCELLANEOUS) ×1 IMPLANT
GLOVE BIOGEL PI IND STRL 8 (GLOVE) ×1 IMPLANT
GLOVE SURG LX STRL 7.5 STRW (GLOVE) ×1 IMPLANT
GLOVE SURG PROTEXIS BL SZ6.5 (GLOVE) ×1 IMPLANT
GLOVE SURG SYN 6.5 PF PI BL (GLOVE) ×1 IMPLANT
LENS ENVISTA 16.5 (Intraocular Lens) ×1 IMPLANT
LENS IOL ENVISTA UV+ 16.5 (Intraocular Lens) IMPLANT
NDL FILTER BLUNT 18X1 1/2 (NEEDLE) ×1 IMPLANT
NEEDLE FILTER BLUNT 18X1 1/2 (NEEDLE) ×1 IMPLANT
SYR 3ML LL SCALE MARK (SYRINGE) ×1 IMPLANT

## 2023-06-24 NOTE — Anesthesia Postprocedure Evaluation (Signed)
 Anesthesia Post Note  Patient: [name] [name]  Procedure(s) Performed: PHACOEMULSIFICATION, CATARACT, WITH IOL INSERTION 5.63, 00:48.0 (Left)  Patient location during evaluation: PACU Anesthesia Type: MAC Level of consciousness: awake and alert, oriented and patient cooperative Pain management: pain level controlled Vital Signs Assessment: post-procedure vital signs reviewed and stable Respiratory status: spontaneous breathing, nonlabored ventilation and respiratory function stable Cardiovascular status: blood pressure returned to baseline and stable Postop Assessment: adequate PO intake Anesthetic complications: no   No notable events documented.   Last Vitals:  Vitals:   06/24/23 1235 06/24/23 1240  BP: 121/69 117/71  Pulse: 69 73  Resp: 14 15  Temp: (!) 36.4 C (!) 36.4 C  SpO2: 95% 95%    Last Pain:  Vitals:   06/24/23 1240  TempSrc:   PainSc: 0-No pain                 Reed Breech

## 2023-06-24 NOTE — Op Note (Signed)
 OPERATIVE NOTE  [name] [name] [id number] 06/24/2023   PREOPERATIVE DIAGNOSIS: Nuclear sclerotic cataract left eye. H25.12   POSTOPERATIVE DIAGNOSIS: Nuclear sclerotic cataract left eye. H25.12   PROCEDURE:  Phacoemusification with posterior chamber intraocular lens placement of the left eye  Ultrasound time: Procedure(s): PHACOEMULSIFICATION, CATARACT, WITH IOL INSERTION 5.63, 00:48.0 (Left)  LENS:   Implant Name Type Inv. Item Serial No. Manufacturer Lot No. LRB No. Used Action  ENVISTA ACRYLIC IOL   [id number]   Left 1 Implanted      SURGEON:  Julious Payer. Rolley Sims, MD   ANESTHESIA:  Topical with tetracaine drops, augmented with 1% preservative-free intracameral lidocaine.   COMPLICATIONS:  None.   DESCRIPTION OF PROCEDURE:  The patient was identified in the holding room and transported to the operating room and placed in the supine position under the operating microscope.  The left eye was identified as the operative eye, which was prepped and draped in the usual sterile ophthalmic fashion.   A 1 millimeter clear-corneal paracentesis was made inferotemporally. Preservative-free 1% lidocaine mixed with 1:1,000 bisulfite-free aqueous solution of epinephrine was injected into the anterior chamber. The anterior chamber was then filled with Viscoat viscoelastic. A 2.4 millimeter keratome was used to make a clear-corneal incision superotemporally. A curvilinear capsulorrhexis was made with a cystotome and capsulorrhexis forceps. Balanced salt solution was used to hydrodissect and hydrodelineate the nucleus. Phacoemulsification was then used to remove the lens nucleus and epinucleus. The remaining cortex was then removed using the irrigation and aspiration handpiece. Provisc was then placed into the capsular bag to distend it for lens placement. A +16.50 D [id number] intraocular lens was then injected into the capsular bag. The remaining viscoelastic was aspirated.   Wounds were hydrated with  balanced salt solution.  The anterior chamber was inflated to a physiologic pressure with balanced salt solution.  No wound leaks were noted. Moxifloxacin was injected intracamerally.  Timolol and Brimonidine drops were applied to the eye.  The patient was taken to the recovery room in stable condition without complications of anesthesia or surgery.  [name] 06/24/2023, 12:34 PM

## 2023-06-24 NOTE — Transfer of Care (Signed)
 Immediate Anesthesia Transfer of Care Note  Patient: [name] [name]  Procedure(s) Performed: PHACOEMULSIFICATION, CATARACT, WITH IOL INSERTION 5.63, 00:48.0 (Left)  Patient Location: PACU  Anesthesia Type: MAC  Level of Consciousness: awake, alert  and patient cooperative  Airway and Oxygen Therapy: Patient Spontanous Breathing and Patient connected to supplemental oxygen  Post-op Assessment: Post-op Vital signs reviewed, Patient's Cardiovascular Status Stable, Respiratory Function Stable, Patent Airway and No signs of Nausea or vomiting  Post-op Vital Signs: Reviewed and stable  Complications: No notable events documented.

## 2023-06-24 NOTE — H&P (Signed)
 [facility]   Primary Care Physician:  Lauro Regulus, MD Ophthalmologist: Dr. Deberah Pelton  Pre-Procedure History & Physical: HPI:  [name] [name] is a 63 y.o. male here for cataract surgery.   Past Medical History:  Diagnosis Date   Anemia    pernicious anemia   Atherosclerosis of native artery of both lower extremities with intermittent claudication (HCC)    Bilateral carotid artery stenosis    Cancer (HCC)    renal   Chest pain    Unspecified   Chronic kidney disease    Chronic kidney disease, stage III (moderate) (HCC)    Controlled type 2 diabetes mellitus with complication, without long-term current use of insulin (HCC)    COPD (chronic obstructive pulmonary disease) (HCC)    Coronary artery disease involving native coronary artery of native heart without angina pectoris    Dysrhythmia    pac's.  okay now.   GERD (gastroesophageal reflux disease)    History of kidney stones    Hypertension    Hypertriglyceridemia    Kidney stone on left side    2018   Palpitations    Peripheral arterial disease with history of revascularization [facility])    Peripheral vascular disease (HCC) 2019   Pulmonary emphysema (HCC)    PVD (peripheral vascular disease) (HCC)    Renal insufficiency    Renal mass    Thyroid nodule 2019   being monitored    Past Surgical History:  Procedure Laterality Date   COLONOSCOPY N/A 01/23/2021   Procedure: COLONOSCOPY;  Surgeon: Midge Minium, MD;  Location: [facility] ENDOSCOPY;  Service: Endoscopy;  Laterality: N/A;   CYSTOSCOPY W/ RETROGRADES Bilateral 03/23/2017   Procedure: CYSTOSCOPY WITH RETROGRADE PYELOGRAM;  Surgeon: Riki Altes, MD;  Location: [facility] ORS;  Service: Urology;  Laterality: Bilateral;   CYSTOSCOPY W/ URETERAL STENT PLACEMENT Left 03/31/2017   Procedure: CYSTOSCOPY WITH STENT REPLACEMENT;  Surgeon: Riki Altes, MD;  Location: [facility] ORS;  Service: Urology;  Laterality: Left;    CYSTOSCOPY/RETROGRADE/URETEROSCOPY/STONE EXTRACTION WITH BASKET Left 03/31/2017   Procedure: CYSTOSCOPY/RETROGRADE/URETEROSCOPY/STONE EXTRACTION WITH BASKET;  Surgeon: Riki Altes, MD;  Location: [facility] ORS;  Service: Urology;  Laterality: Left;   CYSTOSCOPY/URETEROSCOPY/HOLMIUM LASER/STENT PLACEMENT Left 03/23/2017   Procedure: CYSTOSCOPY/URETEROSCOPY/HOLMIUM LASER/STENT PLACEMENT;  Surgeon: Riki Altes, MD;  Location: [facility] ORS;  Service: Urology;  Laterality: Left;   ESOPHAGOGASTRODUODENOSCOPY (EGD) WITH PROPOFOL N/A 10/22/2015   Procedure: ESOPHAGOGASTRODUODENOSCOPY (EGD) WITH PROPOFOL;  Surgeon: Midge Minium, MD;  Location: [facility] ENDOSCOPY;  Service: Endoscopy;  Laterality: N/A;   EXTRACORPOREAL SHOCK WAVE LITHOTRIPSY  2016   EXTRACORPOREAL SHOCK WAVE LITHOTRIPSY Left 09/13/2014   Procedure: EXTRACORPOREAL SHOCK WAVE LITHOTRIPSY (ESWL);  Surgeon: Vanna Scotland, MD;  Location: [facility] ORS;  Service: Urology;  Laterality: Left;   HERNIA REPAIR     IR RADIOLOGIST EVAL & MGMT  08/01/2020   IR RADIOLOGIST EVAL & MGMT  09/17/2020   IR RADIOLOGIST EVAL & MGMT  01/21/2021   IR RADIOLOGIST EVAL & MGMT  02/03/2022   LOWER EXTREMITY ANGIOGRAPHY Left 07/15/2017   Procedure: LOWER EXTREMITY ANGIOGRAPHY;  Surgeon: Annice Needy, MD;  Location: [facility] INVASIVE CV LAB;  Service: Cardiovascular;  Laterality: Left;   lower extremity interventions x2 right leg Right    stent   LYSIS OF ADHESION N/A 05/09/2021   Procedure: LYSIS OF ADHESION;  Surgeon: Karie Soda, MD;  Location: [facility] ORS;  Service: General;  Laterality: N/A;   PERIPHERAL VASCULAR CATHETERIZATION Right 08/20/2014   Procedure: Lower Extremity Angiography;  Surgeon: Annice Needy, MD;  Location: [facility] INVASIVE CV LAB;  Service: Cardiovascular;  Laterality: Right;   PERIPHERAL VASCULAR CATHETERIZATION Right 08/20/2014   Procedure: Lower Extremity Intervention;  Surgeon: Annice Needy, MD;  Location: [facility] INVASIVE CV LAB;  Service: Cardiovascular;  Laterality:  Right;   RADIOLOGY WITH ANESTHESIA N/A 08/28/2020   Procedure: RADIOLOGY WITH ANESTHESIA CT WITH CRYOABLATION;  Surgeon: Berdine Dance, MD;  Location: [facility] ORS;  Service: Anesthesiology;  Laterality: N/A;   VENTRAL HERNIA REPAIR  08/11/2019   Procedure: HERNIA REPAIR VENTRAL ADULT WITH REPAIR OF LARGE INTESTINE LACERATION;  Surgeon: Carolan Shiver, MD;  Location: [facility] ORS;  Service: General;;   VENTRAL HERNIA REPAIR N/A 05/09/2021   Procedure: LAPAROSCOPIC VENTRAL HERNIA REPAIR WITH MESH WITH TAP BLOCK;  Surgeon: Karie Soda, MD;  Location: [facility] ORS;  Service: General;  Laterality: N/A;   XI ROBOTIC ASSISTED VENTRAL HERNIA N/A 08/11/2019   Procedure: XI ROBOTIC ASSISTED VENTRAL HERNIA;  Surgeon: Carolan Shiver, MD;  Location: [facility] ORS;  Service: General;  Laterality: N/A;  Converted to open procedure    Prior to Admission medications   Medication Sig Start Date End Date Taking? Authorizing Provider  amLODipine (NORVASC) 10 MG tablet Take 10 mg by mouth daily. 05/06/17  Yes [provider]  aspirin EC 81 MG tablet Take 81 mg by mouth daily.   Yes [provider]  clopidogrel (PLAVIX) 75 MG tablet Take 75 mg by mouth daily.   Yes [provider]  gabapentin (NEURONTIN) 300 MG capsule Take 1 capsule (300 mg total) by mouth 3 (three) times daily. 05/10/21  Yes Karie Soda, MD  metoprolol (TOPROL-XL) 200 MG 24 hr tablet Take 200 mg by mouth daily.    Yes [provider]  MOUNJARO 10 MG/0.5ML Pen Inject 10 mg into the skin once a week. 10/05/22  Yes [provider]  omega-3 acid ethyl esters (LOVAZA) 1 G capsule Take 4 g by mouth daily.    Yes [provider]  pantoprazole (PROTONIX) 40 MG tablet Take 40 mg by mouth daily.   Yes [provider]  rosuvastatin (CRESTOR) 40 MG tablet Take 40 mg by mouth daily.    Yes [provider]  traMADol (ULTRAM) 50 MG tablet Take 50 mg by mouth every 6 (six) hours as needed.   Yes [provider]  traZODone (DESYREL) 50 MG tablet Take 50 mg by mouth at bedtime.   Yes [provider]  Multiple Vitamin (MULTIVITAMIN WITH MINERALS) TABS tablet Take 1 tablet by mouth daily.    [provider]    Allergies as of 06/14/2023   (No Known Allergies)    Family History  Problem Relation Age of Onset   Heart failure Other     Social History   Socioeconomic History   Marital status: Married    Spouse name: Not on file   Number of children: Not on file   Years of education: Not on file   Highest education level: Not on file  Occupational History   Occupation: Full Time  Tobacco Use   Smoking status: Former    Current packs/day: 0.00    Average packs/day: 1 pack/day for 37.0 years (37.0 ttl pk-yrs)    Types: Cigarettes    Start date: 04/21/1976    Quit date: 04/21/2013    Years since quitting: 10.1   Smokeless tobacco: Never  Vaping Use   Vaping status: Never Used  Substance and Sexual Activity   Alcohol use: Yes  Alcohol/week: 14.0 standard drinks of alcohol    Types: 7 Glasses of wine, 7 Cans of beer per week    Comment: 2 drinks daily   Drug use: No   Sexual activity: Yes  Other Topics Concern   Not on file  Social History Narrative   Regular exercise: No   Social Drivers of Corporate investment banker Strain: Low Risk  (12/14/2022)   Received from [facility]   Overall Financial Resource Strain (CARDIA)    Difficulty of Paying Living Expenses: Not hard at all  Food Insecurity: No Food Insecurity (12/14/2022)   Received from [facility]   Hunger Vital Sign    Worried About Running Out of Food in the Last Year: Never true    Ran Out of Food in the Last Year: Never true  Transportation Needs: No Transportation Needs (12/14/2022)   Received from [facility] - Transportation    In the past 12 months, has lack of transportation kept you from medical appointments or from  getting medications?: No    Lack of Transportation (Non-Medical): No  Physical Activity: Not on file  Stress: Not on file  Social Connections: Unknown (02/19/2022)   Received from [facility], [facility]   Social Network    Social Network: Not on file  Intimate Partner Violence: Unknown (02/19/2022)   Received from [facility], [facility]   HITS    Physically Hurt: Not on file    Insult or Talk Down To: Not on file    Threaten Physical Harm: Not on file    Scream or Curse: Not on file    Review of Systems: See HPI, otherwise negative ROS  Physical Exam: Ht 6' (1.829 m)   Wt 89.8 kg   BMI 26.85 kg/m  General:   Alert, cooperative in NAD Head:  Normocephalic and atraumatic. Respiratory:  Normal work of breathing. Cardiovascular:  RRR  Impression/Plan: [name] [name] is here for cataract surgery.  Risks, benefits, limitations, and alternatives regarding cataract surgery have been reviewed with the patient.  Questions have been answered.  All parties agreeable.   Estanislado Pandy, MD  06/24/2023, 7:23 AM

## 2023-06-25 ENCOUNTER — Encounter: Payer: Self-pay | Admitting: Ophthalmology

## 2023-06-30 ENCOUNTER — Other Ambulatory Visit: Payer: Self-pay | Admitting: Emergency Medicine

## 2023-06-30 DIAGNOSIS — R911 Solitary pulmonary nodule: Secondary | ICD-10-CM

## 2023-06-30 DIAGNOSIS — Z122 Encounter for screening for malignant neoplasm of respiratory organs: Secondary | ICD-10-CM

## 2023-06-30 DIAGNOSIS — Z87891 Personal history of nicotine dependence: Secondary | ICD-10-CM

## 2023-07-14 ENCOUNTER — Ambulatory Visit
Admission: RE | Admit: 2023-07-14 | Discharge: 2023-07-14 | Disposition: A | Discharge status: Home / Self Care | Source: Ambulatory Visit | Attending: Emergency Medicine | Admitting: Emergency Medicine

## 2023-07-14 DIAGNOSIS — Z87891 Personal history of nicotine dependence: Secondary | ICD-10-CM

## 2023-07-14 DIAGNOSIS — Z122 Encounter for screening for malignant neoplasm of respiratory organs: Secondary | ICD-10-CM

## 2023-07-14 DIAGNOSIS — R911 Solitary pulmonary nodule: Secondary | ICD-10-CM

## 2023-07-22 ENCOUNTER — Ambulatory Visit

## 2023-07-22 DIAGNOSIS — M2142 Flat foot [pes planus] (acquired), left foot: Secondary | ICD-10-CM

## 2023-07-22 DIAGNOSIS — M14679 Charcot's joint, unspecified ankle and foot: Secondary | ICD-10-CM

## 2023-07-26 NOTE — Progress Notes (Signed)
 Pt.was present and casted for [place]  [place]zo brace last brace received is wearing out and not providing proper support or stability.  Called [employer] spoke with Cyndee Dragon  Ref# [id number] Patient has met Deductible / $2333.00 remaining of $6000 total oop for year  20% coins will apply / Patient is allwd 1 AFO / W1027,O5366, L2330 per side every 24 months / no prior auth required   Patient was casted with out incident scan and measurements to be sent to [facility]. Patient scheduled PU appt, for 5/30 in [place] office at 11:15am    Britton Cane CPed, CFo, CFm

## 2023-08-24 ENCOUNTER — Other Ambulatory Visit: Payer: Self-pay | Admitting: Interventional Radiology

## 2023-08-24 ENCOUNTER — Encounter (INDEPENDENT_AMBULATORY_CARE_PROVIDER_SITE_OTHER): Payer: Self-pay

## 2023-08-24 DIAGNOSIS — C641 Malignant neoplasm of right kidney, except renal pelvis: Secondary | ICD-10-CM

## 2023-08-31 ENCOUNTER — Inpatient Hospital Stay: Admission: RE | Admit: 2023-08-31 | Source: Ambulatory Visit

## 2023-09-02 ENCOUNTER — Ambulatory Visit
Admission: RE | Admit: 2023-09-02 | Discharge: 2023-09-02 | Disposition: A | Discharge status: Home / Self Care | Source: Ambulatory Visit | Attending: Interventional Radiology | Admitting: Interventional Radiology

## 2023-09-02 ENCOUNTER — Inpatient Hospital Stay: Admission: RE | Admit: 2023-09-02 | Source: Ambulatory Visit

## 2023-09-02 DIAGNOSIS — C641 Malignant neoplasm of right kidney, except renal pelvis: Secondary | ICD-10-CM

## 2023-09-02 MED ORDER — IOPAMIDOL (ISOVUE-300) INJECTION 61%
100.0000 mL | Freq: Once | INTRAVENOUS | Status: AC | PRN
  Administered 2023-09-02: 100 mL via INTRAVENOUS

## 2023-09-03 ENCOUNTER — Ambulatory Visit (INDEPENDENT_AMBULATORY_CARE_PROVIDER_SITE_OTHER)

## 2023-09-03 DIAGNOSIS — L84 Corns and callosities: Secondary | ICD-10-CM

## 2023-09-03 DIAGNOSIS — M14679 Charcot's joint, unspecified ankle and foot: Secondary | ICD-10-CM

## 2023-09-03 DIAGNOSIS — M2141 Flat foot [pes planus] (acquired), right foot: Secondary | ICD-10-CM

## 2023-09-03 DIAGNOSIS — M2142 Flat foot [pes planus] (acquired), left foot: Secondary | ICD-10-CM

## 2023-09-03 DIAGNOSIS — M14672 Charcot's joint, left ankle and foot: Secondary | ICD-10-CM

## 2023-09-06 ENCOUNTER — Encounter: Payer: Self-pay | Admitting: Podiatry

## 2023-09-06 ENCOUNTER — Telehealth: Payer: Self-pay

## 2023-09-06 NOTE — Telephone Encounter (Signed)
 Patient called and left a message - he just got his [place]  brace on Friday 5/30 - he states that it does not fit right, that there is no way he can walk in it [phone]

## 2023-09-08 ENCOUNTER — Other Ambulatory Visit

## 2023-09-09 ENCOUNTER — Ambulatory Visit

## 2023-09-10 NOTE — Progress Notes (Signed)
 Brace sent back to [place]  for adjustments offload to [place] needs to be moved 1/4" proximally and deeper by 1/8"

## 2023-10-01 ENCOUNTER — Other Ambulatory Visit: Payer: Self-pay | Admitting: Interventional Radiology

## 2023-10-01 DIAGNOSIS — C641 Malignant neoplasm of right kidney, except renal pelvis: Secondary | ICD-10-CM

## 2023-10-13 ENCOUNTER — Ambulatory Visit
Admission: RE | Admit: 2023-10-13 | Discharge: 2023-10-13 | Disposition: A | Discharge status: Home / Self Care | Source: Ambulatory Visit | Attending: Interventional Radiology | Admitting: Interventional Radiology

## 2023-10-13 DIAGNOSIS — C641 Malignant neoplasm of right kidney, except renal pelvis: Secondary | ICD-10-CM

## 2023-10-13 HISTORY — PX: IR RADIOLOGIST EVAL & MGMT: IMG5224

## 2023-10-13 NOTE — Progress Notes (Signed)
 Patient ID: [name] [name], male   DOB: [birth year], 63 y.o.   MRN: [id number]       Chief Complaint: Right renal neoplasm status post cryoablation  Referring Physician(s):  Dr. Twylla  History of Present Illness: [name] [name] is a 63 y.o. male with chronic bilateral nephrolithiasis, hypertension, and peripheral vascular disease.  He is closely followed in [facility] by vascular surgery and urology.  In review, he underwent successful CT-guided cryoablation of a 1.6 cm lateral enhancing right renal mass compatible with renal cell carcinoma by imaging.  Procedure was performed 3 years ago 08/28/2020 was a long hospital.  Overall he continues to do very well.  He still works at [employer].  Excellent functional status.  No physical limitations.  No current urinary tract symptoms.  No flank or abdominal pain.  No hematuria or dysuria.  Interval surveillance imaging confirms a stable ablation defect.  No signs of residual or recurrent disease.  No suspicious renal abnormality or abnormal enhancement.  This documents 3 years of stability posttreatment.  Past Medical History:  Diagnosis Date   Anemia    pernicious anemia   Atherosclerosis of native artery of both lower extremities with intermittent claudication (HCC)    Bilateral carotid artery stenosis    Cancer (HCC)    renal   Chest pain    Unspecified   Chronic kidney disease    Chronic kidney disease, stage III (moderate) (HCC)    Controlled type 2 diabetes mellitus with complication, without long-term current use of insulin (HCC)    COPD (chronic obstructive pulmonary disease) (HCC)    Coronary artery disease involving native coronary artery of native heart without angina pectoris    Dysrhythmia    pac's.  okay now.   GERD (gastroesophageal reflux disease)    History of kidney stones    Hypertension    Hypertriglyceridemia    Kidney stone on left side    2018   Palpitations    Peripheral arterial disease with history  of revascularization (HCC)    Peripheral vascular disease (HCC) 2019   Pulmonary emphysema (HCC)    PVD (peripheral vascular disease) (HCC)    Renal insufficiency    Renal mass    Thyroid nodule 2019   being monitored    Past Surgical History:  Procedure Laterality Date   CATARACT EXTRACTION W/PHACO Left 06/24/2023   Procedure: PHACOEMULSIFICATION, CATARACT, WITH IOL INSERTION 5.63, 00:48.0;  Surgeon: Enola Feliciano Hugger, MD;  Location: [facility];  Service: Ophthalmology;  Laterality: Left;   COLONOSCOPY N/A 01/23/2021   Procedure: COLONOSCOPY;  Surgeon: Jinny Carmine, MD;  Location: [facility] END[facility];  Service: Endoscopy;  Laterality: N/A;   CYSTOSCOPY W/ RETROGRADES Bilateral 03/23/2017   Procedure: CYSTOSCOPY WITH RETROGRADE PYELOGRAM;  Surgeon: Twylla Glendia BROCKS, MD;  Location: [facility] ORS;  Service: Urology;  Laterality: Bilateral;   CYSTOSCOPY W/ URETERAL STENT PLACEMENT Left 03/31/2017   Procedure: CYSTOSCOPY WITH STENT REPLACEMENT;  Surgeon: Twylla Glendia BROCKS, MD;  Location: [facility] OR[facility];  Service: Urology;  Laterality: Left;   CYSTOSCOPY/RETROGRADE/URETEROSCOPY/STONE EXTRACTION WITH BASKET Left 03/31/2017   Procedure: CYSTOSCOPY/RETROGRADE/URETEROSCOPY/STONE EXTRACTION WITH BASKET;  Surgeon: Twylla Glendia BROCKS, MD;  Location: [facility];  Service: Urology;  Laterality: Left;   CYSTOSCOPY/URETEROSCOPY/HOLMIUM LASER/STENT PLACEMENT Left 03/23/2017   Procedure: CYSTOSCOPY/URETEROSCOPY/HOLMIUM LASER/STENT PLACEMENT;  Surgeon: Twylla Glendia BROCKS, MD;  Location: [facility];  Service: Urology;  Laterality: Left;   ESOPHAGOGASTRODUODENOSCOPY (EGD) WITH PROPOFOL  N/A 10/22/2015   Procedure: ESOPHAGOGASTRODUODENOSCOPY (EGD) WITH PROPOFOL ;  Surgeon: Carmine Jinny, MD;  Location:  [facility] ENDOSCOPY;  Service: Endoscopy;  Laterality: N/A;   EXTRACORPOREAL SHOCK WAVE LITHOTRIPSY  2016   EXTRACORPOREAL SHOCK WAVE LITHOTRIPSY Left 09/13/2014   Procedure: EXTRACORPOREAL SHOCK WAVE LITHOTRIPSY (ESWL);  Surgeon:  Rosina Riis, MD;  Location: [facility];  Service: Urology;  Laterality: Left;   HERNIA REPAIR     IR RADIOLOGIST EVAL & MGMT  08/01/2020   IR RADIOLOGIST EVAL & MGMT  09/17/2020   IR RADIOLOGIST EVAL & MGMT  01/21/2021   IR RADIOLOGIST EVAL & MGMT  02/03/2022   IR RADIOLOGIST EVAL & MGMT  10/13/2023   LOWER EXTREMITY ANGIOGRAPHY Left 07/15/2017   Procedure: LOWER EXTREMITY ANGIOGRAPHY;  Surgeon: Marea Selinda RAMAN, MD;  Location: [facility];  Service: Cardiovascular;  Laterality: Left;   lower extremity interventions x2 right leg Right    stent   LYSIS OF ADHESION N/A 05/09/2021   Procedure: LYSIS OF ADHESION;  Surgeon: Sheldon Standing, MD;  Location: [facility];  Service: General;  Laterality: N/A;   PERIPHERAL VASCULAR CATHETERIZATION Right 08/20/2014   Procedure: Lower Extremity Angiography;  Surgeon: Selinda RAMAN Marea, MD;  Location: [facility];  Service: Cardiovascular;  Laterality: Right;   PERIPHERAL VASCULAR CATHETERIZATION Right 08/20/2014   Procedure: Lower Extremity Intervention;  Surgeon: Selinda RAMAN Marea, MD;  Location: [facility];  Service: Cardiovascular;  Laterality: Right;   RADIOLOGY WITH ANESTHESIA N/A 08/28/2020   Procedure: RADIOLOGY WITH ANESTHESIA CT WITH CRYOABLATION;  Surgeon: Vanice Sharper, MD;  Location: [facility];  Service: Anesthesiology;  Laterality: N/A;   VENTRAL HERNIA REPAIR  08/11/2019   Procedure: HERNIA REPAIR VENTRAL ADULT WITH REPAIR OF LARGE INTESTINE LACERATION;  Surgeon: Rodolph Romano, MD;  Location: [facility];  Service: General;;   VENTRAL HERNIA REPAIR N/A 05/09/2021   Procedure: LAPAROSCOPIC VENTRAL HERNIA REPAIR WITH MESH WITH TAP BLOCK;  Surgeon: Sheldon Standing, MD;  Location: [facility];  Service: General;  Laterality: N/A;   XI ROBOTIC ASSISTED VENTRAL HERNIA N/A 08/11/2019   Procedure: XI ROBOTIC ASSISTED VENTRAL HERNIA;  Surgeon: Rodolph Romano, MD;  Location: [facility];  Service: General;  Laterality: N/A;  Converted to open procedure     Allergies: Patient has no known allergies.  Medications: Prior to Admission medications   Medication Sig Start Date End Date Taking? Authorizing Provider  amLODipine  (NORVASC ) 10 MG tablet Take 10 mg by mouth daily. 05/06/17   [provider]  aspirin  EC 81 MG tablet Take 81 mg by mouth daily.    [provider]  clopidogrel  (PLAVIX ) 75 MG tablet Take 75 mg by mouth daily.    [provider]  gabapentin  (NEURONTIN ) 300 MG capsule Take 1 capsule (300 mg total) by mouth 3 (three) times daily. 05/10/21   Sheldon Standing, MD  metoprolol  (TOPROL -XL) 200 MG 24 hr tablet Take 200 mg by mouth daily.     [provider]  MOUNJARO 10 MG/0.5ML Pen Inject 10 mg into the skin once a week. 10/05/22   [provider]  Multiple Vitamin (MULTIVITAMIN WITH MINERALS) TABS tablet Take 1 tablet by mouth daily.    [provider]  omega-3 acid ethyl esters (LOVAZA ) 1 G capsule Take 4 g by mouth daily.     [provider]  pantoprazole  (PROTONIX ) 40 MG tablet Take 40 mg by mouth daily.    [provider]  rosuvastatin  (CRESTOR ) 40 MG tablet Take 40 mg by mouth daily.     [provider]  traMADol  (ULTRAM ) 50 MG tablet Take 50  mg by mouth every 6 (six) hours as needed.    [provider]  traZODone (DESYREL) 50 MG tablet Take 50 mg by mouth at bedtime.    [provider]     Family History  Problem Relation Age of Onset   Heart failure Other     Social History   Socioeconomic History   Marital status: Married    Spouse name: Not on file   Number of children: Not on file   Years of education: Not on file   Highest education level: Not on file  Occupational History   Occupation: Full Time  Tobacco Use   Smoking status: Former    Current packs/day: 0.00    Average packs/day: 1 pack/day for 37.0 years (37.0 ttl pk-yrs)    Types: Cigarettes    Start date: 04/21/1976    Quit date: 04/21/2013    Years since  quitting: 10.4   Smokeless tobacco: Never  Vaping Use   Vaping status: Never Used  Substance and Sexual Activity   Alcohol use: Yes    Alcohol/week: 14.0 standard drinks of alcohol    Types: 7 Glasses of wine, 7 Cans of beer per week    Comment: 2 drinks daily   Drug use: No   Sexual activity: Yes  Other Topics Concern   Not on file  Social History Narrative   Regular exercise: No   Social Drivers of Corporate investment banker Strain: Low Risk  (06/27/2023)   Received from [facility]   Overall Financial Resource Strain (CARDIA)    Difficulty of Paying Living Expenses: Not hard at all  Food Insecurity: No Food Insecurity (06/27/2023)   Received from [facility]   Hunger Vital Sign    Within the past 12 months, you worried that your food would run out before you got the money to buy more.: Never true    Within the past 12 months, the food you bought just didn't last and you didn't have money to get more.: Never true  Transportation Needs: No Transportation Needs (06/27/2023)   Received [facility]    In the past 12 months, has lack of transportation kept you from medical appointments or from getting medications?: No    Lack of Transportation (Non-Medical): No  Physical Activity: Not on file  Stress: Not on file  Social Connections: Unknown (02/19/2022)   Received from [facility]   Social Network    Social Network: Not on file    ECOG Status: 0 - Asymptomatic  Review of Systems  Review of Systems: A 12 point ROS discussed and pertinent positives are indicated in the HPI above.  All other systems are negative.    Physical Exam Constitutional:      General: He is not in acute distress.    Appearance: He is not diaphoretic.  Neurological:     Mental Status: He is alert.  Psychiatric:        Mood and Affect: Mood normal.        Thought Content: Thought content normal.         Judgment: Judgment normal.    No direct physical exam was performed (except for noted visual exam findings with Video Visits).  Telemetry health with video only today to review interval surveillance  Vital Signs: There were no vitals taken for this visit.  Imaging: No results found.  Labs:  CBC: No results for input(s):  WBC, HGB, HCT, PLT in the last 8760 hours.  COAGS: No results for input(s): INR, APTT in the last 8760 hours.  BMP: No results for input(s): NA, K, CL, CO2, GLUCOSE, BUN, CALCIUM , CREATININE, GFRNONAA, GFRAA in the last 8760 hours.  Invalid input(s): CMP  LIVER FUNCTION TESTS: No results for input(s): BILITOT, AST, ALT, ALKPHOS, PROT, ALBUMIN in the last 8760 hours.     Assessment and Plan:  3 years status post right renal neoplasm cryoablation performed 08/28/2020 at [facility].  Surveillance imaging confirms stable ablation defect with no signs of residual or recurrent disease.  No new suspicious renal abnormality.  Plan: Continue surveillance imaging in 12 months with a repeat CT without and with contrast in [place] (per patient request) along with an outpatient visit.      Electronically Signed: Ozell Specking 10/13/2023, 10:13 AM   I spent a total of    25 Minutes in remote  clinical consultation, greater than 50% of which was counseling/coordinating care for this Patient status post renal cryoablation.    Visit type: Audio and video (my chart video).   Alternative for in-person consultation at [facility], [place].  his format is felt to be most appropriate for this patient at this time.  All issues noted in this document were discussed and addressed.

## 2023-10-25 ENCOUNTER — Telehealth: Payer: Self-pay

## 2023-10-25 NOTE — Telephone Encounter (Signed)
 Called and LM for patient that repaired brace is back from [place]   I will take to [place] this Friday 7/25 and patient can Pu at his convenience, wear and let me know if there are any other issues  Also, anyone over 18 can pu for patient as well if he is not available  Lolita Schultze Cped, CFo, CFm

## 2023-10-25 NOTE — Progress Notes (Signed)
 Patient presents today to pick up custom molded AFO, diagnosed with   Orthosis was dispensed and fit was good patient was very happy with function. Brace adds support and stability to ankle and will also help to slow progression of ankle deformity.  Patient reviewed with me instructions for break-in and wear. Written instructions given to patient.  Patient will follow up as needed.

## 2023-10-28 NOTE — Progress Notes (Signed)
 LM for patient I will have his brace in [facility] 7/25 and he can stop in to see me if avail

## 2023-11-02 ENCOUNTER — Telehealth: Payer: Self-pay | Admitting: Podiatry

## 2023-11-02 NOTE — Telephone Encounter (Signed)
 The adjustments made on 10/29/23 are still not fitting properly. Patient would like to schedule another appointment for a new fitting. Please advise.  Please call both wk and hm phone #

## 2023-11-05 NOTE — Telephone Encounter (Signed)
 LVM to schedule brace measurement

## 2023-11-05 NOTE — Telephone Encounter (Signed)
 Thank you :)

## 2023-11-11 ENCOUNTER — Other Ambulatory Visit

## 2023-11-22 ENCOUNTER — Ambulatory Visit

## 2023-11-22 NOTE — Progress Notes (Signed)
 Patient was re-casted today as offload to bony prominence is not deep enough  We added a pad on top of bony area to capture depth needed  Old brace and new cast sent to [place]  to be remade  No charge patient will need fitting when back  Lolita Schultze Cped, CFo, CFm

## 2023-11-25 ENCOUNTER — Other Ambulatory Visit (INDEPENDENT_AMBULATORY_CARE_PROVIDER_SITE_OTHER): Payer: Self-pay | Admitting: Vascular Surgery

## 2023-11-25 DIAGNOSIS — I70213 Atherosclerosis of native arteries of extremities with intermittent claudication, bilateral legs: Secondary | ICD-10-CM

## 2023-11-25 DIAGNOSIS — I6523 Occlusion and stenosis of bilateral carotid arteries: Secondary | ICD-10-CM

## 2023-12-07 ENCOUNTER — Ambulatory Visit (INDEPENDENT_AMBULATORY_CARE_PROVIDER_SITE_OTHER): Payer: Managed Care, Other (non HMO)

## 2023-12-07 ENCOUNTER — Ambulatory Visit (INDEPENDENT_AMBULATORY_CARE_PROVIDER_SITE_OTHER): Payer: Managed Care, Other (non HMO) | Admitting: Vascular Surgery

## 2023-12-07 VITALS — BP 119/74 | HR 68 | Wt 197.2 lb

## 2023-12-07 DIAGNOSIS — I6523 Occlusion and stenosis of bilateral carotid arteries: Secondary | ICD-10-CM

## 2023-12-07 DIAGNOSIS — I10 Essential (primary) hypertension: Secondary | ICD-10-CM | POA: Diagnosis not present

## 2023-12-07 DIAGNOSIS — M7989 Other specified soft tissue disorders: Secondary | ICD-10-CM

## 2023-12-07 DIAGNOSIS — I739 Peripheral vascular disease, unspecified: Secondary | ICD-10-CM | POA: Diagnosis not present

## 2023-12-07 DIAGNOSIS — I70213 Atherosclerosis of native arteries of extremities with intermittent claudication, bilateral legs: Secondary | ICD-10-CM | POA: Diagnosis not present

## 2023-12-07 DIAGNOSIS — E118 Type 2 diabetes mellitus with unspecified complications: Secondary | ICD-10-CM

## 2023-12-07 NOTE — Assessment & Plan Note (Signed)
 Carotid duplex today shows stable 1 to 39% ICA stenosis bilaterally.  No changes in medications.  Recheck in 1 year.

## 2023-12-07 NOTE — Progress Notes (Signed)
 MRN : [id number]  [name] [name] is a 63 y.o. ([birth year]) male who presents with chief complaint of  Chief Complaint  Patient presents with   Follow-up  .  History of Present Illness: Patient returns today in follow up of multiple vascular issues.  He has got a small ulceration on the left foot from his Charcot deformity.  He follows with podiatry.  He has had previous revascularization procedures to both lower extremities in years past but nothing in the past 5 years.  His ABIs today are 1.02 on the right and 1.12 on the left with triphasic waveforms and normal digital waveforms bilaterally. He is also followed for carotid disease.  No focal neurologic symptoms. Specifically, the patient denies amaurosis fugax, speech or swallowing difficulties, or arm or leg weakness or numbness.  Carotid duplex today shows stable 1 to 39% ICA stenosis bilaterally.  Current Outpatient Medications  Medication Sig Dispense Refill   amLODipine  (NORVASC ) 10 MG tablet Take 10 mg by mouth daily.     aspirin  EC 81 MG tablet Take 81 mg by mouth daily.     clopidogrel  (PLAVIX ) 75 MG tablet Take 75 mg by mouth daily.     gabapentin  (NEURONTIN ) 300 MG capsule Take 1 capsule (300 mg total) by mouth 3 (three) times daily. 60 capsule 2   metoprolol  (TOPROL -XL) 200 MG 24 hr tablet Take 200 mg by mouth daily.      MOUNJARO 10 MG/0.5ML Pen Inject 10 mg into the skin once a week.     Multiple Vitamin (MULTIVITAMIN WITH MINERALS) TABS tablet Take 1 tablet by mouth daily.     omega-3 acid ethyl esters (LOVAZA ) 1 G capsule Take 4 g by mouth daily.      pantoprazole  (PROTONIX ) 40 MG tablet Take 40 mg by mouth daily.     rosuvastatin  (CRESTOR ) 40 MG tablet Take 40 mg by mouth daily.      traMADol  (ULTRAM ) 50 MG tablet Take 50 mg by mouth every 6 (six) hours as needed.     traZODone (DESYREL) 50 MG tablet Take 50 mg by mouth at bedtime.     No current facility-administered medications for this visit.    Past Medical  History:  Diagnosis Date   Anemia    pernicious anemia   Atherosclerosis of native artery of both lower extremities with intermittent claudication (HCC)    Bilateral carotid artery stenosis    Cancer (HCC)    renal   Chest pain    Unspecified   Chronic kidney disease    Chronic kidney disease, stage III (moderate) (HCC)    Controlled type 2 diabetes mellitus with complication, without long-term current use of insulin (HCC)    COPD (chronic obstructive pulmonary disease) (HCC)    Coronary artery disease involving native coronary artery of native heart without angina pectoris    Dysrhythmia    pac's.  okay now.   GERD (gastroesophageal reflux disease)    History of kidney stones    Hypertension    Hypertriglyceridemia    Kidney stone on left side    2018   Palpitations    Peripheral arterial disease with history of revascularization [facility])    Peripheral vascular disease (HCC) 2019   Pulmonary emphysema (HCC)    PVD (peripheral vascular disease) (HCC)    Renal insufficiency    Renal mass    Thyroid nodule 2019   being monitored    Past Surgical History:  Procedure Laterality Date   CATARACT  EXTRACTION W/PHACO Left 06/24/2023   Procedure: PHACOEMULSIFICATION, CATARACT, WITH IOL INSERTION 5.63, 00:48.0;  Surgeon: Enola Feliciano Hugger, MD;  Location: [facility];  Service: Ophthalmology;  Laterality: Left;   COLONOSCOPY N/A 01/23/2021   Procedure: COLONOSCOPY;  Surgeon: Jinny Carmine, MD;  Location: [facility] ENDOSCOPY;  Service: Endoscopy;  Laterality: N/A;   CYSTOSCOPY W/ RETROGRADES Bilateral 03/23/2017   Procedure: CYSTOSCOPY WITH RETROGRADE PYELOGRAM;  Surgeon: Twylla Glendia BROCKS, MD;  Location: [facility] ORS;  Service: Urology;  Laterality: Bilateral;   CYSTOSCOPY W/ URETERAL STENT PLACEMENT Left 03/31/2017   Procedure: CYSTOSCOPY WITH STENT REPLACEMENT;  Surgeon: Twylla Glendia BROCKS, MD;  Location: [facility] ORS;  Service: Urology;  Laterality: Left;    CYSTOSCOPY/RETROGRADE/URETEROSCOPY/STONE EXTRACTION WITH BASKET Left 03/31/2017   Procedure: CYSTOSCOPY/RETROGRADE/URETEROSCOPY/STONE EXTRACTION WITH BASKET;  Surgeon: Twylla Glendia BROCKS, MD;  Location: [facility] ORS;  Service: Urology;  Laterality: Left;   CYSTOSCOPY/URETEROSCOPY/HOLMIUM LASER/STENT PLACEMENT Left 03/23/2017   Procedure: CYSTOSCOPY/URETEROSCOPY/HOLMIUM LASER/STENT PLACEMENT;  Surgeon: Twylla Glendia BROCKS, MD;  Location: [facility] ORS;  Service: Urology;  Laterality: Left;   ESOPHAGOGASTRODUODENOSCOPY (EGD) WITH PROPOFOL  N/A 10/22/2015   Procedure: ESOPHAGOGASTRODUODENOSCOPY (EGD) WITH PROPOFOL ;  Surgeon: Carmine Jinny, MD;  Location: [facility] ENDOSCOPY;  Service: Endoscopy;  Laterality: N/A;   EXTRACORPOREAL SHOCK WAVE LITHOTRIPSY  2016   EXTRACORPOREAL SHOCK WAVE LITHOTRIPSY Left 09/13/2014   Procedure: EXTRACORPOREAL SHOCK WAVE LITHOTRIPSY (ESWL);  Surgeon: Rosina Riis, MD;  Location: [facility] OR[facility];  Service: Urology;  Laterality: Left;   HERNIA REPAIR     IR RADIOLOGIST EVAL & MGMT  08/01/2020   IR RADIOLOGIST EVAL & MGMT  09/17/2020   IR RADIOLOGIST EVAL & MGMT  01/21/2021   IR RADIOLOGIST EVAL & MGMT  02/03/2022   IR RADIOLOGIST EVAL & MGMT  10/13/2023   LOWER EXTREMITY ANGIOGRAPHY Left 07/15/2017   Procedure: LOWER EXTREMITY ANGIOGRAPHY;  Surgeon: Marea Selinda RAMAN, MD;  Location: [facility] INVASIVE [facility];  Service: Cardiovascular;  Laterality: Left;   lower extremity interventions x2 right leg Right    stent   LYSIS OF ADHESION N/A 05/09/2021   Procedure: LYSIS OF ADHESION;  Surgeon: Sheldon Standing, MD;  Location: [facility];  Service: General;  Laterality: N/A;   PERIPHERAL VASCULAR CATHETERIZATION Right 08/20/2014   Procedure: Lower Extremity Angiography;  Surgeon: Selinda RAMAN Marea, MD;  Location: [facility];  Service: Cardiovascular;  Laterality: Right;   PERIPHERAL VASCULAR CATHETERIZATION Right 08/20/2014   Procedure: Lower Extremity Intervention;  Surgeon: Selinda RAMAN Marea, MD;  Location: [facility];   Service: Cardiovascular;  Laterality: Right;   RADIOLOGY WITH ANESTHESIA N/A 08/28/2020   Procedure: RADIOLOGY WITH ANESTHESIA CT WITH CRYOABLATION;  Surgeon: Vanice Sharper, MD;  Location: [facility];  Service: Anesthesiology;  Laterality: N/A;   VENTRAL HERNIA REPAIR  08/11/2019   Procedure: HERNIA REPAIR VENTRAL ADULT WITH REPAIR OF LARGE INTESTINE LACERATION;  Surgeon: Rodolph Romano, MD;  Location: [facility] OR[facility];  Service: General;;   VENTRAL HERNIA REPAIR N/A 05/09/2021   Procedure: LAPAROSCOPIC VENTRAL HERNIA REPAIR WITH MESH WITH TAP BLOCK;  Surgeon: Sheldon Standing, MD;  Location: [facility];  Service: General;  Laterality: N/A;   XI ROBOTIC ASSISTED VENTRAL HERNIA N/A 08/11/2019   Procedure: XI ROBOTIC ASSISTED VENTRAL HERNIA;  Surgeon: Rodolph Romano, MD;  Location: [facility] OR[facility];  Service: General;  Laterality: N/A;  Converted to open procedure     Social History   Tobacco Use   Smoking status: Former    Current packs/day: 0.00    Average packs/day: 1 pack/day for 37.0 years (37.0 ttl  pk-yrs)    Types: Cigarettes    Start date: 04/21/1976    Quit date: 04/21/2013    Years since quitting: 10.6   Smokeless tobacco: Never  Vaping Use   Vaping status: Never Used  Substance Use Topics   Alcohol use: Yes    Alcohol/week: 14.0 standard drinks of alcohol    Types: 7 Glasses of wine, 7 Cans of beer per week    Comment: 2 drinks daily   Drug use: No      Family History  Problem Relation Age of Onset   Heart failure Other   No bleeding or clotting disorders  No Known Allergies   REVIEW OF SYSTEMS (Negative unless checked)   Constitutional: [] Weight loss  [] Fever  [] Chills Cardiac: [] Chest pain   [] Chest pressure   [] Palpitations   [] Shortness of breath when laying flat   [] Shortness of breath at rest   [] Shortness of breath with exertion. Vascular:  [x] Pain in legs with walking   [] Pain in legs at rest   [] Pain in legs when laying flat   [] Claudication   [] Pain in feet when walking   [] Pain in feet at rest  [] Pain in feet when laying flat   [] History of DVT   [] Phlebitis   [] Swelling in legs   [] Varicose veins   [] Non-healing ulcers Pulmonary:   [] Uses home oxygen   [] Productive cough   [] Hemoptysis   [] Wheeze  [x] COPD   [] Asthma Neurologic:  [] Dizziness  [] Blackouts   [] Seizures   [] History of stroke   [] History of TIA  [] Aphasia   [] Temporary blindness   [] Dysphagia   [] Weakness or numbness in arms   [] Weakness or numbness in legs Musculoskeletal:  [x] Arthritis   [] Joint swelling   [] Joint pain   [] Low back pain Hematologic:  [] Easy bruising  [] Easy bleeding   [] Hypercoagulable state   [x] Anemic   Gastrointestinal:  [] Blood in stool   [] Vomiting blood  [x] Gastroesophageal reflux/heartburn   [] Abdominal pain Genitourinary:  [x] Chronic kidney disease   [] Difficult urination  [] Frequent urination  [] Burning with urination   [] Hematuria Skin:  [] Rashes   [x] Ulcers   [x] Wounds Psychological:  [] History of anxiety   []  History of major depression.   Physical Examination  BP 119/74   Pulse 68   Wt 197 lb 4 oz (89.5 kg)   BMI 26.75 kg/m  Gen:  WD/WN, NAD Head: [place]/AT, No temporalis wasting. Ear/Nose/Throat: Hearing grossly intact, nares w/o erythema or drainage Eyes: Conjunctiva clear. Sclera non-icteric Neck: Supple.  Trachea midline Pulmonary:  Good air movement, no use of accessory muscles.  Cardiac: RRR, no JVD Vascular:  Vessel Right Left  Radial Palpable Palpable                          PT Palpable Palpable  DP Palpable Palpable   Gastrointestinal: soft, non-tender/non-distended. No guarding/reflex.  Musculoskeletal: M/S 5/5 throughout.  No deformity or atrophy. No edema. Neurologic: Sensation grossly intact in extremities.  Symmetrical.  Speech is fluent.  Psychiatric: Judgment intact, Mood & affect appropriate for pt's clinical situation. Dermatologic: No rashes or ulcers noted.  No cellulitis or open wounds.     Labs No results found for this or  any previous visit (from the past 2160 hours).  Radiology No results found.  Assessment/Plan  Controlled type 2 diabetes mellitus with complication, without long-term current use of insulin (HCC) blood glucose control important in reducing the progression of atherosclerotic disease. Also, involved in wound  healing. On appropriate medications.   PVD (peripheral vascular disease) (HCC) His ABIs today are 1.02 on the right and 1.12 on the left with triphasic waveforms and normal digital waveforms bilaterally.  He should have adequate blood flow to heal any ulcerations with his Charcot issues.  Continue current medical regimen.  Recheck in 1 year  Carotid stenosis Carotid duplex today shows stable 1 to 39% ICA stenosis bilaterally.  No changes in medications.  Recheck in 1 year.  Benign essential HTN blood pressure control important in reducing the progression of atherosclerotic disease. On appropriate oral medications.   Swelling of limb Improved and essentially resolved at this point.  Selinda Gu, MD  12/07/2023 2:05 PM    This note was created with Dragon medical transcription system.  Any errors from dictation are purely unintentional

## 2023-12-07 NOTE — Assessment & Plan Note (Signed)
 His ABIs today are 1.02 on the right and 1.12 on the left with triphasic waveforms and normal digital waveforms bilaterally.  He should have adequate blood flow to heal any ulcerations with his Charcot issues.  Continue current medical regimen.  Recheck in 1 year

## 2023-12-07 NOTE — Assessment & Plan Note (Signed)
 blood glucose control important in reducing the progression of atherosclerotic disease. Also, involved in wound healing. On appropriate medications.

## 2023-12-10 LAB — VAS US ABI WITH/WO TBI
Left ABI: 1.1
Right ABI: 1.02

## 2023-12-24 ENCOUNTER — Ambulatory Visit (INDEPENDENT_AMBULATORY_CARE_PROVIDER_SITE_OTHER)

## 2023-12-24 VITALS — Ht 72.0 in | Wt 197.0 lb

## 2023-12-24 DIAGNOSIS — M14671 Charcot's joint, right ankle and foot: Secondary | ICD-10-CM | POA: Diagnosis not present

## 2023-12-24 DIAGNOSIS — L97511 Non-pressure chronic ulcer of other part of right foot limited to breakdown of skin: Secondary | ICD-10-CM

## 2023-12-24 DIAGNOSIS — M14679 Charcot's joint, unspecified ankle and foot: Secondary | ICD-10-CM | POA: Diagnosis not present

## 2023-12-24 DIAGNOSIS — L84 Corns and callosities: Secondary | ICD-10-CM

## 2023-12-24 DIAGNOSIS — M2142 Flat foot [pes planus] (acquired), left foot: Secondary | ICD-10-CM

## 2023-12-24 DIAGNOSIS — M2141 Flat foot [pes planus] (acquired), right foot: Secondary | ICD-10-CM

## 2023-12-24 NOTE — Progress Notes (Signed)
 Subjective:  Patient ID: [name] [name], male    DOB: 1961/03/02,  MRN: [id number]  Chief Complaint  Patient presents with   Foot Pain    Patient is here for F/U for Charcot's joint of left  and RFC foot    63 y.o. male presents with the above complaint.  Patient states that he has a known diagnosis of Charcot neuroarthropathy.  He uses an [place]  brace that is generally very well-tolerated.  Recently, he acquired a new brace that he does not believe was fitted properly.  He has subsequently developed some hyperkeratotic tissue to the medial plantar midfoot.  He states that it did have a small amount of serosanguineous drainage.  He denies any purulence, signs of infection, systemic signs of illness.  Review of Systems: Negative except as noted in the HPI. Denies N/V/F/Ch.  Past Medical History:  Diagnosis Date   Anemia    pernicious anemia   Atherosclerosis of native artery of both lower extremities with intermittent claudication (HCC)    Bilateral carotid artery stenosis    Cancer (HCC)    renal   Chest pain    Unspecified   Chronic kidney disease    Chronic kidney disease, stage III (moderate) (HCC)    Controlled type 2 diabetes mellitus with complication, without long-term current use of insulin (HCC)    COPD (chronic obstructive pulmonary disease) (HCC)    Coronary artery disease involving native coronary artery of native heart without angina pectoris    Dysrhythmia    pac's.  okay now.   GERD (gastroesophageal reflux disease)    History of kidney stones    Hypertension    Hypertriglyceridemia    Kidney stone on left side    2018   Palpitations    Peripheral arterial disease with history of revascularization (HCC)    Peripheral vascular disease (HCC) 2019   Pulmonary emphysema (HCC)    PVD (peripheral vascular disease) (HCC)    Renal insufficiency    Renal mass    Thyroid nodule 2019   being monitored    Current Outpatient Medications:    amLODipine   (NORVASC ) 10 MG tablet, Take 10 mg by mouth daily., Disp: , Rfl:    aspirin  EC 81 MG tablet, Take 81 mg by mouth daily., Disp: , Rfl:    clopidogrel  (PLAVIX ) 75 MG tablet, Take 75 mg by mouth daily., Disp: , Rfl:    gabapentin  (NEURONTIN ) 300 MG capsule, Take 1 capsule (300 mg total) by mouth 3 (three) times daily., Disp: 60 capsule, Rfl: 2   metoprolol  (TOPROL -XL) 200 MG 24 hr tablet, Take 200 mg by mouth daily. , Disp: , Rfl:    MOUNJARO 10 MG/0.5ML Pen, Inject 10 mg into the skin once a week., Disp: , Rfl:    Multiple Vitamin (MULTIVITAMIN WITH MINERALS) TABS tablet, Take 1 tablet by mouth daily., Disp: , Rfl:    omega-3 acid ethyl esters (LOVAZA ) 1 G capsule, Take 4 g by mouth daily. , Disp: , Rfl:    pantoprazole  (PROTONIX ) 40 MG tablet, Take 40 mg by mouth daily., Disp: , Rfl:    rosuvastatin  (CRESTOR ) 40 MG tablet, Take 40 mg by mouth daily. , Disp: , Rfl:    traMADol  (ULTRAM ) 50 MG tablet, Take 50 mg by mouth every 6 (six) hours as needed., Disp: , Rfl:    traZODone (DESYREL) 50 MG tablet, Take 50 mg by mouth at bedtime., Disp: , Rfl:   Social History   Tobacco Use  Smoking Status  Former   Current packs/day: 0.00   Average packs/day: 1 pack/day for 37.0 years (37.0 ttl pk-yrs)   Types: Cigarettes   Start date: 04/21/1976   Quit date: 04/21/2013   Years since quitting: 10.6  Smokeless Tobacco Never    No Known Allergies Objective:  There were no vitals filed for this visit. Body mass index is 26.72 kg/m. Constitutional Well developed. Well nourished. Oriented to person, place, and time.  Vascular Dorsalis pedis pulses palpable bilaterally. Posterior tibial pulses palpable bilaterally. Capillary refill normal to all digits.  No cyanosis or clubbing noted. Pedal hair growth normal.  Neurologic Normal speech. Epicritic sensation to light touch grossly present bilaterally. Negative tinel sign at tarsal tunnel bilaterally.   Dermatologic Skin texture and turgor are within  normal limits.  2 pinpoint ulcerative lesion noted plantar to the prominent bony medial plantar midfoot.  No surrounding erythema, edema, purulent drainage, signs of infection.  No crepitus, fluctuance. No active drainage.   Musculoskeletal: 5 out of 5 muscle strength in all major pedal muscle groups.  Prominence present to the medial midfoot.  Flattening of the arch without rocker-bottom foot deformity.  No other areas of prominence noted.     Radiographs: Taken and reviewed.  3 weightbearing views of the left foot were taken.  These do show flattening of the arch as well as changes to the tarsometatarsal joints that are consistent with Charcot arthropathy.  There is no rocker-bottom foot deformity.  There is elevation of the medial column.  No acute osseous fractures are identified.  No identifiable bone infection.  Assessment:   1. Right foot ulcer, limited to breakdown of skin (HCC)   2. Charcot arthropathy of midfoot   3. Pes planus of both feet   4. Pre-ulcerative calluses    Plan:  - Patient was evaluated and treated and all questions answered.  Charcot arthropathy with pinpoint ulceration   - Discussed with patient his known diagnosis of Charcot arthropathy.  We did discuss that he generally manages this very well with the brace.  He recently got a new [place]  brace, he feels that his current 1 is causing rubbing to his medial midfoot.  He is in the process of acquiring a new one, this should arrive to him in approximately 1 week. - Fortunately, the pinpoint ulcerations that were identified deep to the hyperkeratotic tissue of the plantar medial midfoot at this time.  Patient does relate that it had some very minor serosanguineous drainage, there is none present today.  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 He will continue to monitor it closely.  - We discussed the care of his Charcot foot moving forward.  We discussed that if  he continues to have success with bracing, this is an excellent treatment plan to continue with.  If he begins to develop ulcerations consistently or his foot begins to change shape again, there are reconstruction options available.  We did discuss that this would be a lengthy recovery to which she expresses understanding.  He will book an appointment with me if he wishes to workup possible surgical treatment. [place] Ron[place], excisional debridement of ulceration utilizing 312 blade was completed today.  Postdebridement, 2 small ulcers measuring 0.5cm x 0.5cm present.  No deep extension, no signs of infection were noted.  This was done down to and including dermal tissue. Dressed with abx ointment and bandaid. He is to replace this dressing once daily.  RTC 2-3 weeks for recheck  Prentice Ovens, [facility] Fellowship Trained Podiatric Surgeon [facility]

## 2023-12-31 ENCOUNTER — Ambulatory Visit

## 2024-01-11 NOTE — Progress Notes (Signed)
 Remade brace was tried on and is a much better fit better offload given to navicular  Patient will try and will call if any other problems arise  Lolita Schultze Cped, CFo, CFm

## 2024-02-03 ENCOUNTER — Emergency Department

## 2024-02-03 ENCOUNTER — Inpatient Hospital Stay
Admission: EM | Admit: 2024-02-03 | Discharge: 2024-02-05 | DRG: 280 | Disposition: A | Discharge status: Home / Self Care | Attending: Pulmonary Disease | Admitting: Pulmonary Disease

## 2024-02-03 ENCOUNTER — Other Ambulatory Visit: Payer: Self-pay

## 2024-02-03 DIAGNOSIS — I4901 Ventricular fibrillation: Principal | ICD-10-CM | POA: Diagnosis present

## 2024-02-03 DIAGNOSIS — M96A3 Multiple fractures of ribs associated with chest compression and cardiopulmonary resuscitation: Secondary | ICD-10-CM | POA: Diagnosis present

## 2024-02-03 DIAGNOSIS — D72828 Other elevated white blood cell count: Secondary | ICD-10-CM | POA: Diagnosis present

## 2024-02-03 DIAGNOSIS — I214 Non-ST elevation (NSTEMI) myocardial infarction: Secondary | ICD-10-CM | POA: Diagnosis not present

## 2024-02-03 DIAGNOSIS — I462 Cardiac arrest due to underlying cardiac condition: Secondary | ICD-10-CM | POA: Diagnosis present

## 2024-02-03 DIAGNOSIS — K219 Gastro-esophageal reflux disease without esophagitis: Secondary | ICD-10-CM | POA: Diagnosis present

## 2024-02-03 DIAGNOSIS — I10 Essential (primary) hypertension: Secondary | ICD-10-CM | POA: Diagnosis present

## 2024-02-03 DIAGNOSIS — I2510 Atherosclerotic heart disease of native coronary artery without angina pectoris: Secondary | ICD-10-CM | POA: Diagnosis present

## 2024-02-03 DIAGNOSIS — Z7985 Long-term (current) use of injectable non-insulin antidiabetic drugs: Secondary | ICD-10-CM

## 2024-02-03 DIAGNOSIS — R911 Solitary pulmonary nodule: Secondary | ICD-10-CM | POA: Diagnosis present

## 2024-02-03 DIAGNOSIS — J439 Emphysema, unspecified: Secondary | ICD-10-CM | POA: Diagnosis present

## 2024-02-03 DIAGNOSIS — I21A1 Myocardial infarction type 2: Secondary | ICD-10-CM | POA: Diagnosis present

## 2024-02-03 DIAGNOSIS — N179 Acute kidney failure, unspecified: Secondary | ICD-10-CM | POA: Diagnosis present

## 2024-02-03 DIAGNOSIS — K760 Fatty (change of) liver, not elsewhere classified: Secondary | ICD-10-CM | POA: Diagnosis present

## 2024-02-03 DIAGNOSIS — E781 Pure hyperglyceridemia: Secondary | ICD-10-CM | POA: Diagnosis present

## 2024-02-03 DIAGNOSIS — J9600 Acute respiratory failure, unspecified whether with hypoxia or hypercapnia: Principal | ICD-10-CM | POA: Diagnosis present

## 2024-02-03 DIAGNOSIS — Z79899 Other long term (current) drug therapy: Secondary | ICD-10-CM

## 2024-02-03 DIAGNOSIS — Z7982 Long term (current) use of aspirin: Secondary | ICD-10-CM

## 2024-02-03 DIAGNOSIS — E8729 Other acidosis: Secondary | ICD-10-CM | POA: Diagnosis not present

## 2024-02-03 DIAGNOSIS — S27892A Contusion of other specified intrathoracic organs, initial encounter: Secondary | ICD-10-CM | POA: Diagnosis present

## 2024-02-03 DIAGNOSIS — I469 Cardiac arrest, cause unspecified: Secondary | ICD-10-CM | POA: Diagnosis present

## 2024-02-03 DIAGNOSIS — Z1152 Encounter for screening for COVID-19: Secondary | ICD-10-CM

## 2024-02-03 DIAGNOSIS — Z85528 Personal history of other malignant neoplasm of kidney: Secondary | ICD-10-CM

## 2024-02-03 DIAGNOSIS — T63481A Toxic effect of venom of other arthropod, accidental (unintentional), initial encounter: Secondary | ICD-10-CM | POA: Diagnosis present

## 2024-02-03 DIAGNOSIS — Z8249 Family history of ischemic heart disease and other diseases of the circulatory system: Secondary | ICD-10-CM | POA: Diagnosis not present

## 2024-02-03 DIAGNOSIS — Y998 Other external cause status: Secondary | ICD-10-CM | POA: Diagnosis not present

## 2024-02-03 DIAGNOSIS — Z87891 Personal history of nicotine dependence: Secondary | ICD-10-CM

## 2024-02-03 DIAGNOSIS — E872 Acidosis, unspecified: Secondary | ICD-10-CM | POA: Diagnosis present

## 2024-02-03 DIAGNOSIS — E876 Hypokalemia: Secondary | ICD-10-CM | POA: Diagnosis present

## 2024-02-03 DIAGNOSIS — Z7902 Long term (current) use of antithrombotics/antiplatelets: Secondary | ICD-10-CM | POA: Diagnosis not present

## 2024-02-03 DIAGNOSIS — R680 Hypothermia, not associated with low environmental temperature: Secondary | ICD-10-CM | POA: Diagnosis present

## 2024-02-03 DIAGNOSIS — E1151 Type 2 diabetes mellitus with diabetic peripheral angiopathy without gangrene: Secondary | ICD-10-CM | POA: Diagnosis present

## 2024-02-03 DIAGNOSIS — I9589 Other hypotension: Secondary | ICD-10-CM | POA: Diagnosis present

## 2024-02-03 LAB — CBC WITH DIFFERENTIAL/PLATELET
Abs Immature Granulocytes: 0.41 K/uL — ABNORMAL HIGH (ref 0.00–0.07)
Basophils Absolute: 0 K/uL (ref 0.0–0.1)
Basophils Relative: 0 %
Eosinophils Absolute: 0.1 K/uL (ref 0.0–0.5)
Eosinophils Relative: 1 %
HCT: 51.7 % (ref 39.0–52.0)
Hemoglobin: 16.7 g/dL (ref 13.0–17.0)
Immature Granulocytes: 4 %
Lymphocytes Relative: 50 %
Lymphs Abs: 5.7 K/uL — ABNORMAL HIGH (ref 0.7–4.0)
MCH: 32.7 pg (ref 26.0–34.0)
MCHC: 32.3 g/dL (ref 30.0–36.0)
MCV: 101.2 fL — ABNORMAL HIGH (ref 80.0–100.0)
Monocytes Absolute: 0.3 K/uL (ref 0.1–1.0)
Monocytes Relative: 3 %
Neutro Abs: 4.7 K/uL (ref 1.7–7.7)
Neutrophils Relative %: 42 %
Platelets: 191 K/uL (ref 150–400)
RBC: 5.11 MIL/uL (ref 4.22–5.81)
RDW: 12.7 % (ref 11.5–15.5)
WBC: 11.3 K/uL — ABNORMAL HIGH (ref 4.0–10.5)
nRBC: 0.2 % (ref 0.0–0.2)

## 2024-02-03 LAB — COMPREHENSIVE METABOLIC PANEL WITH GFR
ALT: 34 U/L (ref 0–44)
AST: 38 U/L (ref 15–41)
Albumin: 3.7 g/dL (ref 3.5–5.0)
Alkaline Phosphatase: 57 U/L (ref 38–126)
Anion gap: 20 — ABNORMAL HIGH (ref 5–15)
BUN: 20 mg/dL (ref 8–23)
CO2: 13 mmol/L — ABNORMAL LOW (ref 22–32)
Calcium: 8.3 mg/dL — ABNORMAL LOW (ref 8.9–10.3)
Chloride: 107 mmol/L (ref 98–111)
Creatinine, Ser: 1.33 mg/dL — ABNORMAL HIGH (ref 0.61–1.24)
GFR, Estimated: 60 mL/min — ABNORMAL LOW (ref >60)
Glucose, Bld: 227 mg/dL — ABNORMAL HIGH (ref 70–99)
Potassium: 3.2 mmol/L — ABNORMAL LOW (ref 3.5–5.1)
Sodium: 140 mmol/L (ref 135–145)
Total Bilirubin: 0.8 mg/dL (ref 0.0–1.2)
Total Protein: 7.4 g/dL (ref 6.5–8.1)

## 2024-02-03 LAB — TROPONIN I (HIGH SENSITIVITY)
Troponin I (High Sensitivity): 20 ng/L — ABNORMAL HIGH (ref <18)
Troponin I (High Sensitivity): 33 ng/L — ABNORMAL HIGH (ref <18)
Troponin I (High Sensitivity): 58 ng/L — ABNORMAL HIGH (ref <18)
Troponin I (High Sensitivity): 56 ng/L — ABNORMAL HIGH (ref <18)

## 2024-02-03 LAB — BLOOD GAS, VENOUS
Acid-base deficit: 16.1 mmol/L — ABNORMAL HIGH (ref 0.0–2.0)
Bicarbonate: 14.2 mmol/L — ABNORMAL LOW (ref 20.0–28.0)
O2 Saturation: 73.5 %
Patient temperature: 37
pCO2, Ven: 50 mmHg (ref 44–60)
pH, Ven: 7.06 — CL (ref 7.25–7.43)
pO2, Ven: 53 mmHg — ABNORMAL HIGH (ref 32–45)

## 2024-02-03 LAB — RESP PANEL BY RT-PCR (RSV, FLU A&B, COVID)  RVPGX2
Influenza A by PCR: NEGATIVE
Influenza B by PCR: NEGATIVE
Resp Syncytial Virus by PCR: NEGATIVE
SARS Coronavirus 2 by RT PCR: NEGATIVE

## 2024-02-03 LAB — LACTIC ACID, PLASMA
Lactic Acid, Venous: >9 mmol/L (ref 0.5–1.9)
Lactic Acid, Venous: 3.8 mmol/L (ref 0.5–1.9)

## 2024-02-03 LAB — PHOSPHORUS: Phosphorus: 3.9 mg/dL (ref 2.5–4.6)

## 2024-02-03 LAB — CBG MONITORING, ED: Glucose-Capillary: 216 mg/dL — ABNORMAL HIGH (ref 70–99)

## 2024-02-03 LAB — BRAIN NATRIURETIC PEPTIDE: B Natriuretic Peptide: 37.7 pg/mL (ref 0.0–100.0)

## 2024-02-03 LAB — MAGNESIUM: Magnesium: 1.9 mg/dL (ref 1.7–2.4)

## 2024-02-03 LAB — ETHANOL: Alcohol, Ethyl (B): 29 mg/dL — ABNORMAL HIGH (ref <15)

## 2024-02-03 LAB — MRSA NEXT GEN BY PCR, NASAL: MRSA by PCR Next Gen: NOT DETECTED

## 2024-02-03 MED ORDER — LACTATED RINGERS IV BOLUS
1000.0000 mL | Freq: Once | INTRAVENOUS | Status: AC
  Administered 2024-02-03: 1000 mL via INTRAVENOUS

## 2024-02-03 MED ORDER — MAGNESIUM SULFATE 2 GM/50ML IV SOLN
2.0000 g | Freq: Once | INTRAVENOUS | Status: AC
  Administered 2024-02-03: 2 g via INTRAVENOUS
  Filled 2024-02-03: qty 50

## 2024-02-03 MED ORDER — OXYCODONE HCL 5 MG PO TABS
5.0000 mg | ORAL_TABLET | Freq: Four times a day (QID) | ORAL | Status: DC | PRN
  Administered 2024-02-04 (×2): 5 mg via ORAL
  Filled 2024-02-03 (×2): qty 1

## 2024-02-03 MED ORDER — DOCUSATE SODIUM 100 MG PO CAPS: 100.0000 mg | ORAL_CAPSULE | Freq: Two times a day (BID) | ORAL | Status: DC | PRN

## 2024-02-03 MED ORDER — POTASSIUM CHLORIDE 10 MEQ/100ML IV SOLN
10.0000 meq | INTRAVENOUS | Status: AC
  Administered 2024-02-03 (×2): 10 meq via INTRAVENOUS
  Filled 2024-02-03 (×2): qty 100

## 2024-02-03 MED ORDER — ACETAMINOPHEN 500 MG PO TABS
1000.0000 mg | ORAL_TABLET | Freq: Four times a day (QID) | ORAL | Status: DC | PRN
  Administered 2024-02-03 – 2024-02-05 (×4): 1000 mg via ORAL
  Filled 2024-02-03 (×5): qty 2

## 2024-02-03 MED ORDER — CHLORHEXIDINE GLUCONATE CLOTH 2 % EX PADS
6.0000 | MEDICATED_PAD | Freq: Every day | CUTANEOUS | Status: DC
  Administered 2024-02-03 – 2024-02-04 (×2): 6 via TOPICAL
  Filled 2024-02-03: qty 6

## 2024-02-03 MED ORDER — IOHEXOL 350 MG/ML SOLN
75.0000 mL | Freq: Once | INTRAVENOUS | Status: AC | PRN
Start: 2024-02-03 — End: 2024-02-03
  Administered 2024-02-03: 75 mL via INTRAVENOUS

## 2024-02-03 MED ORDER — POLYETHYLENE GLYCOL 3350 17 G PO PACK: 17.0000 g | PACK | Freq: Every day | ORAL | Status: DC | PRN

## 2024-02-03 MED ORDER — MORPHINE SULFATE (PF) 2 MG/ML IV SOLN
2.0000 mg | INTRAVENOUS | Status: DC | PRN
  Administered 2024-02-03: 2 mg via INTRAVENOUS
  Filled 2024-02-03 (×2): qty 1

## 2024-02-03 NOTE — Progress Notes (Signed)
 eLink Physician-Brief Progress Note Patient Name: [name] [name] DOB: 07/01/1960 MRN: [id number]   Date of Service  02/03/2024  HPI/Events of Note  Patient with transient cardio-pulmonary arrest in the field after possible insect sting (unverified), he was admitted to the ICU for hypotension, he required transient I-Gel placement but removed it when he regained consciousness, work up in progress.  eICU Interventions  New Patient Evaluation.        Gabrielle Wakeland U Tenise Stetler 02/03/2024, 11:33 PM

## 2024-02-03 NOTE — Consult Note (Signed)
 PHARMACY CONSULT NOTE - ELECTROLYTES  Pharmacy Consult for Electrolyte Monitoring and Replacement   Recent Labs: Potassium (mmol/L)  Date Value  02/03/2024 3.2 (L)  04/23/2014 4.3   Magnesium  (mg/dL)  Date Value  89/69/7974 1.9   Calcium  (mg/dL)  Date Value  89/69/7974 8.3 (L)   Calcium , Total (mg/dL)  Date Value  98/81/7983 7.9 (L)   Albumin (g/dL)  Date Value  89/69/7974 3.7  04/23/2014 3.5   Phosphorus (mg/dL)  Date Value  89/69/7974 3.9   Sodium (mmol/L)  Date Value  02/03/2024 140  04/23/2014 139   Height: 6' (182.9 cm) Weight: 89.9 kg (198 lb 3.1 oz) IBW/kg (Calculated) : 77.6 Estimated Creatinine Clearance: 61.6 mL/min (A) (by C-G formula based on SCr of 1.33 mg/dL (H)).  Assessment  [name] [name] is a 63 y.o. male presenting with post cardiac arrest after suspected allergic reaction. PMH significant for COPD, T2DM, HTN, CAD, CKD, GERD and PVD . Pharmacy has been consulted to monitor and replace electrolytes.  Labs on admit notable for lactate >9, Co2 13 with elevated AG.  Diet: N/A MIVF: N/A Pertinent medications: N/A  Goal of Therapy: Electrolytes within normal limits  Plan:  K 3.2, give Kcl 10 mEq IV q1h x 2 Mg 1.9, magnesium  sulfate 2 g IV x 1 Re-check labs in AM  Thank you for allowing pharmacy to be a part of this patient's care.  Marolyn KATHEE Mare 02/03/2024 8:30 PM

## 2024-02-03 NOTE — ED Provider Notes (Signed)
 [facility] Provider Note    Event Date/Time   First MD Initiated Contact with Patient 02/03/24 1548     (approximate)   History   Post cardiac arrest   HPI  [name] [name] is a 63 y.o. male with a history of CKD, type 2 diabetes, COPD, CAD, hypertension, GERD, and PVD who presents with an episode of loss of consciousness and possible loss of pulses.  Per the wife, the patient was sitting outside on the porch.  She had last seen him about 30 minutes prior.  She came home from shopping and was bringing items in and going back out of the house when the patient complained that he was stung by something on the back of his neck.  She did not see any red mark when she checked him.  The next time that she came out, the patient was slumped over and unconscious.  EMS reports that when the fire department arrived, the patient had no pulse and CPR was started.  He had ROSC but then lost pulses again, and had ROSC a second time.  When paramedics arrived, the patient appeared dusky and cyanotic with agonal respirations.  They placed an i-gel airway and did some suctioning.  The patient immediately started to wake up, then became somewhat agitated and pulled the Igel out.  He received 2.1 mg of Versed .  The patient has been breathing spontaneously since that time.  I reviewed the past medical records.  The patient's most recent outpatient encounters were with podiatry on 9/19 and vascular surgery on 9/2 for follow-up of his PVD.  He has no recent ED visits or hospitalizations.   Physical Exam   Triage Vital Signs: ED Triage Vitals  Encounter Vitals Group     BP 02/03/24 1539 (!) 89/57     Girls Systolic BP Percentile --      Girls Diastolic BP Percentile --      Boys Systolic BP Percentile --      Boys Diastolic BP Percentile --      Pulse Rate 02/03/24 1539 (!) 114     Resp 02/03/24 1539 (!) 25     Temp --      Temp src --      SpO2 02/03/24 1539 96 %     Weight  02/03/24 1541 209 lb 8 oz (95 kg)     Height 02/03/24 1541 6' (1.829 m)     Head Circumference --      Peak Flow --      Pain Score --      Pain Loc --      Pain Education --      Exclude from Growth Chart --     Most recent vital signs: Vitals:   02/03/24 1640 02/03/24 1641  BP: 103/69   Pulse: (!) 103 (!) 101  Resp: 19 (!) 23  Temp:    SpO2: 100% 100%    General: Somnolent but arousable, following commands, no distress.  CV:  Good peripheral perfusion.  Resp:  Normal respiratory effort.  Lungs CTAB.  Oropharynx clear. Abd:  No distention.  Nontender. Other:  EOMI.  PERRLA.  Motor intact in all extremities.  No urticaria or other rashes.  Oropharynx clear with no swelling.  No stridor or pooled secretions.   ED Results / Procedures / Treatments   Labs (all labs ordered are listed, but only abnormal results are displayed) Labs Reviewed  COMPREHENSIVE METABOLIC PANEL WITH GFR -  Abnormal; Notable for the following components:      Result Value   Potassium 3.2 (*)    CO2 13 (*)    Glucose, Bld 227 (*)    Creatinine, Ser 1.33 (*)    Calcium  8.3 (*)    GFR, Estimated 60 (*)    Anion gap 20 (*)    All other components within normal limits  CBC WITH DIFFERENTIAL/PLATELET - Abnormal; Notable for the following components:   WBC 11.3 (*)    MCV 101.2 (*)    Lymphs Abs 5.7 (*)    Abs Immature Granulocytes 0.41 (*)    All other components within normal limits  LACTIC ACID, PLASMA - Abnormal; Notable for the following components:   Lactic Acid, Venous >9.0 (*)    All other components within normal limits  LACTIC ACID, PLASMA - Abnormal; Notable for the following components:   Lactic Acid, Venous 3.8 (*)    All other components within normal limits  BLOOD GAS, VENOUS - Abnormal; Notable for the following components:   pH, Ven 7.06 (*)    pO2, Ven 53 (*)    Bicarbonate 14.2 (*)    Acid-base deficit 16.1 (*)    All other components within normal limits  ETHANOL - Abnormal;  Notable for the following components:   Alcohol, Ethyl (B) 29 (*)    All other components within normal limits  CBG MONITORING, ED - Abnormal; Notable for the following components:   Glucose-Capillary 216 (*)    All other components within normal limits  TROPONIN I (HIGH SENSITIVITY) - Abnormal; Notable for the following components:   Troponin I (High Sensitivity) 20 (*)    All other components within normal limits  TROPONIN I (HIGH SENSITIVITY) - Abnormal; Notable for the following components:   Troponin I (High Sensitivity) 33 (*)    All other components within normal limits  RESP PANEL BY RT-PCR (RSV, FLU A&B, COVID)  RVPGX2  CULTURE, BLOOD (ROUTINE X 2)  CULTURE, BLOOD (ROUTINE X 2)  MRSA NEXT GEN BY PCR, NASAL  BRAIN NATRIURETIC PEPTIDE  URINALYSIS, ROUTINE W REFLEX MICROSCOPIC  URINE DRUG SCREEN, QUALITATIVE ([facility] ONLY)  HIV ANTIBODY (ROUTINE TESTING W REFLEX)  CBC  BASIC METABOLIC PANEL WITH GFR  MAGNESIUM   MAGNESIUM   PHOSPHORUS  TROPONIN I (HIGH SENSITIVITY)     EKG  ED ECG REPORT I, Waylon Cassis, the attending physician, personally viewed and interpreted this ECG.  Date: 02/03/2024 EKG Time: 1539 Rate: 114 Rhythm: Sinus tachycardia QRS Axis: Borderline right axis Intervals: Borderline prolonged QT ST/T Wave abnormalities: normal Narrative Interpretation: no evidence of acute ischemia    RADIOLOGY  Chest x-ray: I independently viewed and interpreted the images; there is no focal consolidation or edema  CT head: No ICH or other acute abnormality  PROCEDURES:  Critical Care performed: Yes, see critical care procedure note(s)  .Critical Care  Performed by: Cassis Waylon, MD Authorized by: Cassis Waylon, MD   Critical care provider statement:    Critical care time (minutes):  30   Critical care time was exclusive of:  Separately billable procedures and treating other patients   Critical care was necessary to treat or prevent imminent  or life-threatening deterioration of the following conditions:  Respiratory failure   Critical care was time spent personally by me on the following activities:  Development of treatment plan with patient or surrogate, discussions with consultants, evaluation of patient's response to treatment, examination of patient, ordering and review of laboratory studies, ordering and review  of radiographic studies, ordering and performing treatments and interventions, pulse oximetry, re-evaluation of patient's condition, review of old charts and obtaining history from patient or surrogate   Care discussed with: admitting provider      MEDICATIONS ORDERED IN ED: Medications  Chlorhexidine  Gluconate Cloth 2 % PADS 6 each (has no administration in time range)  docusate sodium  (COLACE) capsule 100 mg (has no administration in time range)  polyethylene glycol (MIRALAX / GLYCOLAX) packet 17 g (has no administration in time range)  lactated ringers  bolus 1,000 mL (0 mLs Intravenous Stopped 02/03/24 1704)  lactated ringers  bolus 1,000 mL (0 mLs Intravenous Stopped 02/03/24 1754)  iohexol  (OMNIPAQUE ) 350 MG/ML injection 75 mL (75 mLs Intravenous Contrast Given 02/03/24 1738)     IMPRESSION / MDM / ASSESSMENT AND PLAN / ED COURSE  I reviewed the triage vital signs and the nursing notes.  63 year old male with PMH as noted above presents after an episode of loss of consciousness and possible loss of pulses with CPR performed twice prior to arrival by paramedics.  On paramedic arrival he appeared to be in respiratory distress and started to wake up after an airway was placed.  The patient is now fully arousable, maintaining his airway, with spontaneous respirations and normal effort.  He is hypotensive and tachycardic.  Neurologic exam is nonfocal.  There is no visible trauma.  Differential diagnosis is broad and includes, but is not limited to, allergic reaction, anaphylaxis, cardiac dysrhythmia, other primary  cardiac etiology, electrolyte abnormality, AKI, hypoglycemia, other metabolic causes, acute infection/sepsis, seizure or other CNS etiology.  We will obtain CT head, chest x-ray, lab workup, give fluids, and reassess.  Patient's presentation is most consistent with acute presentation with potential threat to life or bodily function.  The patient is on the cardiac monitor to evaluate for evidence of arrhythmia and/or significant heart rate changes.  ----------------------------------------- 5:35 PM on 02/03/2024 -----------------------------------------  Lab workup is significant for lactic acidosis consistent with post cardiac arrest. CMP shows an elevated anion gap.  CBC shows mild leukocytosis.  Troponin is not significantly elevated.  VBG shows pH of 7.06.  The patient's blood pressure is significantly improved and he is now much more alert.  He continues to have no specific symptoms to suggest an allergic reaction.  The etiology of his episode is unclear.  He is still pending the CT head.  Chest x-ray is clear.  I consulted and discussed the case with Dr. Malka from the ICU who will evaluate the patient for admission.  He recommended adding on a CT angio to rule out PE.  ----------------------------------------- 6:50 PM on 02/03/2024 -----------------------------------------  CTA is negative for PE.  CT head is also negative.  The patient's vital signs remain stable.  He has been admitted to the ICU.   FINAL CLINICAL IMPRESSION(S) / ED DIAGNOSES   Final diagnoses:  Acute respiratory failure, unspecified whether with hypoxia or hypercapnia (HCC)  Cardiac arrest (HCC)     Rx / DC Orders   ED Discharge Orders     None        Note:  This document was prepared using Dragon voice recognition software and may include unintentional dictation errors.    Jacolyn Pae, MD 02/03/24 [phone]

## 2024-02-03 NOTE — H&P (Signed)
 NAME:  [name] [name], MRN:  [id number], DOB:  [birth year], LOS: 0 ADMISSION DATE:  02/03/2024, CONSULTATION DATE:  02/03/2024 REFERRING MD:  Dr. Jacolyn, CHIEF COMPLAINT:  Post Cardiac Arrest   History of Present Illness:  Mr. [name] [name] is a 63 y.o. male with a past medical history significant for COPD, Type II Diabetes, Hypertension, CAD, CKD, GERD and PVD who presented to [facility] ED via EMS following an episode of loss of consciousness and loss of pulses. Per his wife, he had been sitting outside on the porch for approximately 30 minutes when she returned home. At the time of her return, the patient began to complain of being stung by something on the back of his neck, likely a bee, as he had seen one flying around him at that time. His wife checked his neck and did not see any indication of a sting at that time. She proceeded to bring her belongings inside of their home and when she returned to the porch, the patient was leaned over unconscious. She called EMS and per their reports, when the Fire Department arrived, the patient had no pulse. CPR was subsequently began with ROSC but shortly after lost pulses again, then to have ROSC a second time. Upon paramedic arrival, the patient appeared dusky and cyanotic with agonal respirations. An I-gel airway was placed and suctioning completed which resulted in him becoming more awake and alert. At that time he became more agitated and removed the I-gel. He received 2.1mg  of Versed  for agitation and has been breathing spontaneously since. The patient denies any previous episodes similar to this one.   ED Course Initial Vitals: Temp: 96.5, HR: 114, BP 98/57, RR 25, SpO2 96% on RA. Upon arrival to the ED, the patient was awake and talking when seen by the EDP. He remained on room air without signs of respiratory distress. CXR did not show signs of any acute cardiopulmonary process. He was taken to imaging for a CT Head and CTA Chest PE.    Medications Administered LR bolus x 2  Pertinent Labs/Diagnostics VBG: ph 7.06, pCO2 50, pO2 53, Bicabr 14.2 Anion Gap: 20 Na: 140 K: 3.2 CO2: 13 Cr: 1.33 Ethanol: 29 Lactic Acid: >9 RVP (Flu/Covid/RSV): negative Troponin: 20 BNP: 37.7 CBG: 216 WBC: 11.3 EKG: sinus tachycardia CXR: no acute cardiopulmonary process CT Head wo Contrast: No acute intracranial abnormality CTA Chest PE: no evidence of pulmonary embolism; anteromediastinal hematoma, acute bilateral rib fractures, bilateral lower lobe peribronchial wall thickening and bibasilar/lingular atelectasis or inflammation; new 4mm solid pulmonary nodule in right middle lobe. Blood cultures x 2 drawn   PCCM consulted for evaluation and admission for hypotension in the setting of post-cardiac arrest, AGMA, lactic acidosis and acute kidney injury.  Pertinent Medical History  COPD Hypertension CAD Type II Diabetes Mellitus CKD GERD PVD  Micro Data 10/30 Blood cultures x 2 >> 10/30 RVP (Flu/covid/rsv) >> negative   Significant Hospital Events: Including procedures, antibiotic start and stop dates in addition to other pertinent events   10/30: Arrived via EMS following cardiac arrest with ROSC x 2. Agitated upon arrival, received 2.1mg  versed  and has been resting in NAD, breathing spontaneously. VBG: ph 7.06, pCO2 50, pO2 53, Bicarb 14.2. Lactic acidosis (>9) with AKI (Cr. 1.33).   Interim History / Subjective:  See above listed under Significant Hospital Events.  Objective    Blood pressure 103/69, pulse (!) 101, temperature (!) 96.5 F (35.8 C), temperature source Axillary, resp. rate (!) 23, height  6' (1.829 m), weight 95 kg, SpO2 100%.       No intake or output data in the 24 hours ending 02/03/24 1803 Filed Weights   02/03/24 1541  Weight: 95 kg    Examination: General: well-nourished, well developed, ill appearing, lying in bed in NAD HENT: atraumatic, normocephalic, supple, no JVD Lungs:  clear and equal throughout with mildly diminished at bases Cardiovascular: sinus tachycardia, S1 S2, no m/r/g Abdomen: soft, non-tender, non-distended, no rebound/guarding, bowel sounds x 4 Extremities: lower extremities cool with bare-hugger in place, no edema, radial pulse 2+, distal 1+ Skin: no rashes or urticaria, dry, intact Neuro: alert and oriented x 4, follows commands, no focal neuro deficits, PERRL GU: deferred  Resolved problem list   Assessment and Plan   #Hypotension iso Post Cardiac Arrest secondary to suspected allergic reaction #Anteromediastinal Hematoma secondary to Chest Compressions #Elevated Troponin secondary to Demand Ischemia #CAD #HLD Hx: Hypertension, CAD, PVD, HLD Home Meds: Amlodipine , Plavix , Metoprolol , Rosuvastatin , Aspirin  10/30 Head CT: no acute intracranial abnormality. - Continuous cardiac monitoring - Vasopressors to maintain MAP goal >65 ~ not currently requiring - Continue IV fluid resuscitation as able - BNP 37 on admission - Trend Troponins until peaked (20 >> 33) - STAT ECHO to r/o cardiac tamponade following hematoma development s/p CPR - Cardiology consult, appreciate input - Hold home meds for now - Repeat ECG as indicated   #Acute Respiratory Failure d/t Cardiac Arrest ~ Resolved #COPD #New Solid Pulmonary Nodule of Right Middle Lobe #Bilateral Rib Fractures d/t CPR Hx: COPD (not requiring home oxygen), former smoker Home Meds: Atrovent HFA inhaler 2 inhalations 4 times daily 10/30 CTA Chest PE: no evidence of pulmonary embolism; anteromediastinal hematoma, acute bilateral rib fractures, bilateral lower lobe peribronchial wall thickening and bibasilar/lingular atelectasis or inflammation; new 4mm solid pulmonary nodule in right middle lobe. 10/30 CXR: no acute cardiopulmonary process - Supplemental oxygen to maintain >92% - Currently on room air without signs of respiratory distress - Duonebs prn - Intermittent CXR and ABG as  indicated - RVP (Flu/Covid/RSV): negative - Manage pain as able    #Anion Gap Metabolic Acidosis secondary to Cardiac Arrest #Lactic Acidosis secondary to Cardiac Arrest #Hypokalemia #AKI superimposed on CKD Stage III Baseline Cr: 0.9-1.11, 1.33 on admission Hx: Chronic Kidney Disease, Renal Cell Carcinoma - Strict I/O's - Trend BMP and renal function - Avoid nephrotoxins as able - Ensure renal perfusion - Trend lactic acid: >9, 3.8 - Electrolyte replacement protocol as indicated - Pharmacy to assist with replacement - IV fluid resuscitation as BP and renal function allows   #GERD Hx: GERD, Hepatic Steatosis Home Meds: Protonix  40mg  - GI prophylaxis as indicated ~ not currently requiring - Diet: NPO for now - Constipation protocol: colace and miralax prn   HEME Hx: Pernicious Anemia - Trend CBC, monitor H/H and platelets - Transfuse if Hgb < 7 or active s/sx of bleeding - Monitor for s/sx of bleeding - Hgb on admission: 16.7   #Type II Diabetes Mellitus #Charcot Neuroarthropathy Hx: Type II Diabetes Mellitus Home Meds: Mounjaro, Gabapentin  - ICU hypo/hyperglycemia protocol - CBG Q4h - Glucose goal: 140-180 - SSI as indicated   #Suspected Reactive Leukocytosis - Trend WBC and monitor fever curve - WBC 11.3 on admission, hypothermic 96.5 on admission ~ continue Bare Hugger prn - Blood cultures x 2 pending - Consider starting abx if wbc continues to rise, a fever is present and s/sx of infection are evident    Labs   CBC: Recent Labs  Lab 02/03/24 1546  WBC 11.3*  NEUTROABS 4.7  HGB 16.7  HCT 51.7  MCV 101.2*  PLT 191    Basic Metabolic Panel: Recent Labs  Lab 02/03/24 1546  NA 140  K 3.2*  CL 107  CO2 13*  GLUCOSE 227*  BUN 20  CREATININE 1.33*  CALCIUM  8.3*   GFR: Estimated Creatinine Clearance: 67.1 mL/min (A) (by C-G formula based on SCr of 1.33 mg/dL (H)). Recent Labs  Lab 02/03/24 1546  WBC 11.3*  LATICACIDVEN >9.0*    Liver  Function Tests: Recent Labs  Lab 02/03/24 1546  AST 38  ALT 34  ALKPHOS 57  BILITOT 0.8  PROT 7.4  ALBUMIN 3.7   No results for input(s): LIPASE, AMYLASE in the last 168 hours. No results for input(s): AMMONIA in the last 168 hours.  ABG    Component Value Date/Time   HCO3 14.2 (L) 02/03/2024 1539   ACIDBASEDEF 16.1 (H) 02/03/2024 1539   O2SAT 73.5 02/03/2024 1539     Coagulation Profile: No results for input(s): INR, PROTIME in the last 168 hours.  Cardiac Enzymes: No results for input(s): CKTOTAL, CKMB, CKMBINDEX, TROPONINI in the last 168 hours.  HbA1C: Hgb A1c MFr Bld  Date/Time Value Ref Range Status  08/20/2020 09:25 AM 6.5 (H) 4.8 - 5.6 % Final    Comment:    (NOTE) Pre diabetes:          5.7%-6.4%  Diabetes:              >6.4%  Glycemic control for   <7.0% adults with diabetes   10/19/2015 04:28 AM 5.8 4.0 - 6.0 % Final    CBG: Recent Labs  Lab 02/03/24 1543  GLUCAP 216*    Review of Systems:   Review of Systems  Constitutional:  Negative for chills, fever and malaise/fatigue.  Eyes:  Negative for blurred vision and double vision.  Respiratory:  Negative for cough, hemoptysis, shortness of breath and wheezing.   Cardiovascular:  Negative for chest pain and palpitations.  Gastrointestinal:  Negative for abdominal pain, heartburn, nausea and vomiting.  Neurological:  Positive for loss of consciousness. Negative for dizziness, sensory change, speech change and headaches.     Past Medical History:  He,  has a past medical history of Anemia, Atherosclerosis of native artery of both lower extremities with intermittent claudication, Bilateral carotid artery stenosis, Cancer (HCC), Chest pain, Chronic kidney disease, Chronic kidney disease, stage III (moderate) (HCC), Controlled type 2 diabetes mellitus with complication, without long-term current use of insulin (HCC), COPD (chronic obstructive pulmonary disease) (HCC), Coronary artery  disease involving native coronary artery of native heart without angina pectoris, Dysrhythmia, GERD (gastroesophageal reflux disease), History of kidney stones, Hypertension, Hypertriglyceridemia, Kidney stone on left side, Palpitations, Peripheral arterial disease with history of revascularization, Peripheral vascular disease (2019), Pulmonary emphysema (HCC), PVD (peripheral vascular disease), Renal insufficiency, Renal mass, and Thyroid nodule (2019).   Surgical History:   Past Surgical History:  Procedure Laterality Date   CATARACT EXTRACTION W/PHACO Left 06/24/2023   Procedure: PHACOEMULSIFICATION, CATARACT, WITH IOL INSERTION 5.63, 00:48.0;  Surgeon: Enola Feliciano Hugger, MD;  Location: [facility];  Service: Ophthalmology;  Laterality: Left;   COLONOSCOPY N/A 01/23/2021   Procedure: COLONOSCOPY;  Surgeon: Jinny Carmine, MD;  Location: [facility];  Service: Endoscopy;  Laterality: N/A;   CYSTOSCOPY W/ RETROGRADES Bilateral 03/23/2017   Procedure: CYSTOSCOPY WITH RETROGRADE PYELOGRAM;  Surgeon: Twylla Glendia BROCKS, MD;  Location: [facility] ORS;  Service: Urology;  Laterality: Bilateral;  CYSTOSCOPY W/ URETERAL STENT PLACEMENT Left 03/31/2017   Procedure: CYSTOSCOPY WITH STENT REPLACEMENT;  Surgeon: Twylla Glendia BROCKS, MD;  Location: [facility] ORS;  Service: Urology;  Laterality: Left;   CYSTOSCOPY/RETROGRADE/URETEROSCOPY/STONE EXTRACTION WITH BASKET Left 03/31/2017   Procedure: CYSTOSCOPY/RETROGRADE/URETEROSCOPY/STONE EXTRACTION WITH BASKET;  Surgeon: Twylla Glendia BROCKS, MD;  Location: [facility];  Service: Urology;  Laterality: Left;   CYSTOSCOPY/URETEROSCOPY/HOLMIUM LASER/STENT PLACEMENT Left 03/23/2017   Procedure: CYSTOSCOPY/URETEROSCOPY/HOLMIUM LASER/STENT PLACEMENT;  Surgeon: Twylla Glendia BROCKS, MD;  Location: [facility];  Service: Urology;  Laterality: Left;   ESOPHAGOGASTRODUODENOSCOPY (EGD) WITH PROPOFOL  N/A 10/22/2015   Procedure: ESOPHAGOGASTRODUODENOSCOPY (EGD) WITH PROPOFOL ;  Surgeon:  Rogelia Copping, MD;  Location: [facility] ENDOSCOPY;  Service: Endoscopy;  Laterality: N/A;   EXTRACORPOREAL SHOCK WAVE LITHOTRIPSY  2016   EXTRACORPOREAL SHOCK WAVE LITHOTRIPSY Left 09/13/2014   Procedure: EXTRACORPOREAL SHOCK WAVE LITHOTRIPSY (ESWL);  Surgeon: Rosina Riis, MD;  Location: [facility];  Service: Urology;  Laterality: Left;   HERNIA REPAIR     IR RADIOLOGIST EVAL & MGMT  08/01/2020   IR RADIOLOGIST EVAL & MGMT  09/17/2020   IR RADIOLOGIST EVAL & MGMT  01/21/2021   IR RADIOLOGIST EVAL & MGMT  02/03/2022   IR RADIOLOGIST EVAL & MGMT  10/13/2023   LOWER EXTREMITY ANGIOGRAPHY Left 07/15/2017   Procedure: LOWER EXTREMITY ANGIOGRAPHY;  Surgeon: Marea Selinda RAMAN, MD;  Location: [facility];  Service: Cardiovascular;  Laterality: Left;   lower extremity interventions x2 right leg Right    stent   LYSIS OF ADHESION N/A 05/09/2021   Procedure: LYSIS OF ADHESION;  Surgeon: Sheldon Standing, MD;  Location: [facility];  Service: General;  Laterality: N/A;   PERIPHERAL VASCULAR CATHETERIZATION Right 08/20/2014   Procedure: Lower Extremity Angiography;  Surgeon: Selinda RAMAN Marea, MD;  Location: [facility];  Service: Cardiovascular;  Laterality: Right;   PERIPHERAL VASCULAR CATHETERIZATION Right 08/20/2014   Procedure: Lower Extremity Intervention;  Surgeon: Selinda RAMAN Marea, MD;  Location: [facility];  Service: Cardiovascular;  Laterality: Right;   RADIOLOGY WITH ANESTHESIA N/A 08/28/2020   Procedure: RADIOLOGY WITH ANESTHESIA CT WITH CRYOABLATION;  Surgeon: Vanice Sharper, MD;  Location: [facility];  Service: Anesthesiology;  Laterality: N/A;   VENTRAL HERNIA REPAIR  08/11/2019   Procedure: HERNIA REPAIR VENTRAL ADULT WITH REPAIR OF LARGE INTESTINE LACERATION;  Surgeon: Rodolph Romano, MD;  Location: [facility];  Service: General;;   VENTRAL HERNIA REPAIR N/A 05/09/2021   Procedure: LAPAROSCOPIC VENTRAL HERNIA REPAIR WITH MESH WITH TAP BLOCK;  Surgeon: Sheldon Standing, MD;  Location: [facility];  Service:  General;  Laterality: N/A;   XI ROBOTIC ASSISTED VENTRAL HERNIA N/A 08/11/2019   Procedure: XI ROBOTIC ASSISTED VENTRAL HERNIA;  Surgeon: Rodolph Romano, MD;  Location: [facility];  Service: General;  Laterality: N/A;  Converted to open procedure     Social History:   reports that he quit smoking about 10 years ago. His smoking use included cigarettes. He started smoking about 47 years ago. He has a 37 pack-year smoking history. He has never used smokeless tobacco. He reports current alcohol use of about 14.0 standard drinks of alcohol per week. He reports that he does not use drugs.   Family History:  His family history includes Heart failure in an other family member.   Allergies No Known Allergies   Home Medications  Prior to Admission medications   Medication Sig Start Date End Date Taking? Authorizing Provider  ATROVENT HFA 17 MCG/ACT inhaler Inhale 2 puffs into the lungs every 6 (  six) hours as needed. 12/22/23  Yes [provider]  amLODipine  (NORVASC ) 10 MG tablet Take 10 mg by mouth daily. 05/06/17   [provider]  aspirin  EC 81 MG tablet Take 81 mg by mouth daily.    [provider]  clopidogrel  (PLAVIX ) 75 MG tablet Take 75 mg by mouth daily.    [provider]  gabapentin  (NEURONTIN ) 300 MG capsule Take 1 capsule (300 mg total) by mouth 3 (three) times daily. 05/10/21   Sheldon Standing, MD  metoprolol  (TOPROL -XL) 200 MG 24 hr tablet Take 200 mg by mouth daily.     [provider]  MOUNJARO 10 MG/0.5ML Pen Inject 10 mg into the skin once a week. 10/05/22   [provider]  Multiple Vitamin (MULTIVITAMIN WITH MINERALS) TABS tablet Take 1 tablet by mouth daily.    [provider]  omega-3 acid ethyl esters (LOVAZA ) 1 G capsule Take 4 g by mouth daily.     [provider]  pantoprazole  (PROTONIX ) 40 MG tablet Take 40 mg by mouth daily.    [provider]  rosuvastatin  (CRESTOR ) 40 MG tablet Take 40 mg by  mouth daily.     [provider]  traMADol  (ULTRAM ) 50 MG tablet Take 50 mg by mouth every 6 (six) hours as needed.    [provider]  traZODone (DESYREL) 50 MG tablet Take 50 mg by mouth at bedtime.    [provider]     Critical care time: 29 Minutes     Robet Kim, PA-C [facility] PCCM Team Contact Info: [phone]

## 2024-02-03 NOTE — ED Triage Notes (Signed)
 Pt comes in via [facility] after being found on front porch unresponsive. Pt lost pulses, received CPR from FD, pulses were regained, and lost again when EMS arrived. ROSC regained after CPR. IGEL area placed by EMS. Pt woke up and began pulling at air way. Air way removed by EMS, and pt given 2.5 of versed . Pt's wife reports that pt felt like he was bitten by something om the back of his neck prior to unresponsiveness. Pt awake and talking and attempting to talk at this time. MD Siadecki at bedside.

## 2024-02-04 ENCOUNTER — Inpatient Hospital Stay
Admit: 2024-02-04 | Discharge: 2024-02-04 | Disposition: A | Discharge status: Home / Self Care | Attending: Student | Admitting: Student

## 2024-02-04 DIAGNOSIS — I469 Cardiac arrest, cause unspecified: Secondary | ICD-10-CM | POA: Diagnosis not present

## 2024-02-04 DIAGNOSIS — E8729 Other acidosis: Secondary | ICD-10-CM

## 2024-02-04 DIAGNOSIS — I10 Essential (primary) hypertension: Secondary | ICD-10-CM

## 2024-02-04 DIAGNOSIS — I214 Non-ST elevation (NSTEMI) myocardial infarction: Secondary | ICD-10-CM

## 2024-02-04 LAB — ECHOCARDIOGRAM COMPLETE
AR max vel: 2.74 cm2
AV Area VTI: 2.81 cm2
AV Area mean vel: 2.64 cm2
AV Mean grad: 4 mmHg
AV Peak grad: 6.6 mmHg
Ao pk vel: 1.28 m/s
Area-P 1/2: 4.36 cm2
Height: 72 in
MV VTI: 3.32 cm2
S' Lateral: 3.1 cm
Weight: 3171.1 [oz_av]

## 2024-02-04 LAB — URINALYSIS, ROUTINE W REFLEX MICROSCOPIC
Bilirubin Urine: NEGATIVE
Glucose, UA: 50 mg/dL — AB
Hgb urine dipstick: NEGATIVE
Ketones, ur: 5 mg/dL — AB
Leukocytes,Ua: NEGATIVE
Nitrite: NEGATIVE
Protein, ur: 100 mg/dL — AB
Specific Gravity, Urine: 1.043 — ABNORMAL HIGH (ref 1.005–1.030)
pH: 5 (ref 5.0–8.0)

## 2024-02-04 LAB — BASIC METABOLIC PANEL WITH GFR
Anion gap: 10 (ref 5–15)
BUN: 22 mg/dL (ref 8–23)
CO2: 22 mmol/L (ref 22–32)
Calcium: 7.7 mg/dL — ABNORMAL LOW (ref 8.9–10.3)
Chloride: 108 mmol/L (ref 98–111)
Creatinine, Ser: 1.17 mg/dL (ref 0.61–1.24)
GFR, Estimated: >60 mL/min (ref >60)
Glucose, Bld: 111 mg/dL — ABNORMAL HIGH (ref 70–99)
Potassium: 3.6 mmol/L (ref 3.5–5.1)
Sodium: 140 mmol/L (ref 135–145)

## 2024-02-04 LAB — URINE DRUG SCREEN, QUALITATIVE (ARMC ONLY)
Amphetamines, Ur Screen: NOT DETECTED
Barbiturates, Ur Screen: NOT DETECTED
Benzodiazepine, Ur Scrn: POSITIVE — AB
Cannabinoid 50 Ng, Ur ~~LOC~~: NOT DETECTED
Cocaine Metabolite,Ur ~~LOC~~: NOT DETECTED
MDMA (Ecstasy)Ur Screen: NOT DETECTED
Methadone Scn, Ur: NOT DETECTED
Opiate, Ur Screen: POSITIVE — AB
Phencyclidine (PCP) Ur S: NOT DETECTED
Tricyclic, Ur Screen: NOT DETECTED

## 2024-02-04 LAB — CBC
HCT: 36.4 % — ABNORMAL LOW (ref 39.0–52.0)
Hemoglobin: 12.2 g/dL — ABNORMAL LOW (ref 13.0–17.0)
MCH: 32.8 pg (ref 26.0–34.0)
MCHC: 33.5 g/dL (ref 30.0–36.0)
MCV: 97.8 fL (ref 80.0–100.0)
Platelets: 122 K/uL — ABNORMAL LOW (ref 150–400)
RBC: 3.72 MIL/uL — ABNORMAL LOW (ref 4.22–5.81)
RDW: 13 % (ref 11.5–15.5)
WBC: 11.4 K/uL — ABNORMAL HIGH (ref 4.0–10.5)
nRBC: 0 % (ref 0.0–0.2)

## 2024-02-04 LAB — PHOSPHORUS: Phosphorus: 4 mg/dL (ref 2.5–4.6)

## 2024-02-04 LAB — MAGNESIUM: Magnesium: 2 mg/dL (ref 1.7–2.4)

## 2024-02-04 LAB — HIV ANTIBODY (ROUTINE TESTING W REFLEX): HIV Screen 4th Generation wRfx: NONREACTIVE

## 2024-02-04 MED ORDER — POTASSIUM CHLORIDE CRYS ER 20 MEQ PO TBCR
40.0000 meq | EXTENDED_RELEASE_TABLET | Freq: Once | ORAL | Status: AC
  Administered 2024-02-04: 40 meq via ORAL
  Filled 2024-02-04: qty 2

## 2024-02-04 MED ORDER — GABAPENTIN 300 MG PO CAPS
300.0000 mg | ORAL_CAPSULE | Freq: Three times a day (TID) | ORAL | Status: DC
  Administered 2024-02-04 – 2024-02-05 (×5): 300 mg via ORAL
  Filled 2024-02-04 (×5): qty 1

## 2024-02-04 MED ORDER — METOPROLOL SUCCINATE ER 100 MG PO TB24
100.0000 mg | ORAL_TABLET | Freq: Every day | ORAL | Status: DC
  Administered 2024-02-04 – 2024-02-05 (×2): 100 mg via ORAL
  Filled 2024-02-04: qty 1
  Filled 2024-02-04: qty 2

## 2024-02-04 MED ORDER — OMEGA-3-ACID ETHYL ESTERS 1 G PO CAPS
4.0000 g | ORAL_CAPSULE | Freq: Every day | ORAL | Status: DC
  Administered 2024-02-04 – 2024-02-05 (×2): 4 g via ORAL
  Filled 2024-02-04 (×2): qty 4

## 2024-02-04 MED ORDER — DIPHENHYDRAMINE-ZINC ACETATE 2-0.1 % EX CREA
TOPICAL_CREAM | Freq: Three times a day (TID) | CUTANEOUS | Status: DC | PRN
  Administered 2024-02-04: 1 via TOPICAL
  Filled 2024-02-04: qty 28

## 2024-02-04 MED ORDER — ROSUVASTATIN CALCIUM 10 MG PO TABS
40.0000 mg | ORAL_TABLET | Freq: Every day | ORAL | Status: DC
  Administered 2024-02-04 – 2024-02-05 (×2): 40 mg via ORAL
  Filled 2024-02-04 (×3): qty 4

## 2024-02-04 MED ORDER — PANTOPRAZOLE SODIUM 40 MG PO TBEC
40.0000 mg | DELAYED_RELEASE_TABLET | Freq: Every day | ORAL | Status: DC
  Administered 2024-02-04 – 2024-02-05 (×2): 40 mg via ORAL
  Filled 2024-02-04 (×2): qty 1

## 2024-02-04 MED ORDER — ADULT MULTIVITAMIN W/MINERALS CH
1.0000 | ORAL_TABLET | Freq: Every day | ORAL | Status: DC
  Administered 2024-02-04 – 2024-02-05 (×2): 1 via ORAL
  Filled 2024-02-04 (×2): qty 1

## 2024-02-04 MED ORDER — AMLODIPINE BESYLATE 10 MG PO TABS
10.0000 mg | ORAL_TABLET | Freq: Every day | ORAL | Status: DC
  Administered 2024-02-04 – 2024-02-05 (×2): 10 mg via ORAL
  Filled 2024-02-04 (×2): qty 1

## 2024-02-04 NOTE — Progress Notes (Signed)
 NAME:  [name] [name], MRN:  [id number], DOB:  16-Mar-1961, LOS: 1 ADMISSION DATE:  02/03/2024  History of Present Illness:  [name] [name] is a 63 y.o. male with a past medical history significant for COPD, Type II Diabetes, Hypertension, CAD, CKD, GERD and PVD who presented to [facility] ED via EMS following an episode of loss of consciousness and loss of pulses. Per his wife, he had been sitting outside on the porch for approximately 30 minutes when she returned home. At the time of her return, the patient began to complain of being stung by something on the back of his neck, likely a bee, as he had seen one flying around him at that time. His wife checked his neck and did not see any indication of a sting at that time. She proceeded to bring her belongings inside of their home and when she returned to the porch, the patient was leaned over unconscious. She called EMS and per their reports, when the Fire Department arrived, the patient had no pulse. CPR was subsequently began with ROSC but shortly after lost pulses again, then to have ROSC a second time. Upon paramedic arrival, the patient appeared dusky and cyanotic with agonal respirations. An I-gel airway was placed and suctioning completed which resulted in him becoming more awake and alert. At that time he became more agitated and removed the I-gel. He received 2.1mg  of Versed  for agitation and has been breathing spontaneously since. The patient denies any previous episodes similar to this one.    ED Course Initial Vitals: Temp: 96.5, HR: 114, BP 98/57, RR 25, SpO2 96% on RA. Upon arrival to the ED, the patient was awake and talking when seen by the EDP. He remained on room air without signs of respiratory distress. CXR did not show signs of any acute cardiopulmonary process. He was taken to imaging for a CT Head and CTA Chest PE.    Medications Administered LR bolus x 2   Pertinent Labs/Diagnostics VBG: ph 7.06, pCO2 50, pO2 53,  Bicabr 14.2 Anion Gap: 20 Na: 140 K: 3.2 CO2: 13 Cr: 1.33 Ethanol: 29 Lactic Acid: >9 RVP (Flu/Covid/RSV): negative Troponin: 20 BNP: 37.7 CBG: 216 WBC: 11.3 EKG: sinus tachycardia CXR: no acute cardiopulmonary process CT Head wo Contrast: No acute intracranial abnormality CTA Chest PE: no evidence of pulmonary embolism; anteromediastinal hematoma, acute bilateral rib fractures, bilateral lower lobe peribronchial wall thickening and bibasilar/lingular atelectasis or inflammation; new 4mm solid pulmonary nodule in right middle lobe. Blood cultures x 2 drawn     PCCM consulted for evaluation and admission for hypotension in the setting of post-cardiac arrest, AGMA, lactic acidosis and acute kidney injury.  Pertinent  Medical History  COPD Hypertension CAD Type II Diabetes Mellitus CKD GERD PVD   Micro Data 10/30 Blood cultures x 2 >> 10/30 RVP (Flu/covid/rsv) >> negative  Significant Hospital Events: Including procedures, antibiotic start and stop dates in addition to other pertinent events   10/30: Arrived via EMS following cardiac arrest with ROSC x 2. Agitated upon arrival, received 2.1mg  versed  and has been resting in NAD, breathing spontaneously. VBG: ph 7.06, pCO2 50, pO2 53, Bicarb 14.2. Lactic acidosis (>9) with AKI (Cr. 1.33).   Interim History / Subjective:  -- Patient awake alert and oriented. Has no major complaints besides chest soreness from chest compressions.  -- Echocardiogram with normal EF 65 to 70%, no RWMA. Grade 1 diastolic dysfunction. RV size is normal.  -- Troponins on mildly increased but  flat at 56  -- Lactic acid downtrended from 9 to 3.8.  -- No arrhythmias on Tele.   Objective    Blood pressure 122/68, pulse 91, temperature 98.3 F (36.8 C), temperature source Oral, resp. rate (!) 35, height 6' (1.829 m), weight 89.9 kg, SpO2 95%.        Intake/Output Summary (Last 24 hours) at 02/04/2024 1143 Last data filed at 02/04/2024 0400 Gross  per 24 hour  Intake 249.84 ml  Output 550 ml  Net -300.16 ml   Filed Weights   02/03/24 1541 02/03/24 1921  Weight: 95 kg 89.9 kg    Examination: General: No acute distress, awake alert and oriented.  HENT: Supple neck, reactive pupils, EOMI Lungs: Clear bilateral air entry  Cardiovascular: Normal S1, Normal S2, RRR Abdomen: Soft, non tender, non distended, + BS  Extremities: Warm and well perfused no edema.   Labs and imaging were reviewed.   Assessment and Plan  Case of a 63 year old male patient with a past medical history of COPD, type 2 diabetes mellitus, hypertension, PVD, neuropathic pain and hyperlipidemia presented yesterday to [facility] after an out-of-hospital nonshockable cardiac arrest.  # Not shockable out-of-hospital cardiac arrest secondary to possible vasovagal reaction in the setting of an insect bite complicated by # Anterior mediastinal hematoma due to CPR as well as acute rib fractures # Type II NSTEMI secondary to demand ischemia # Lactic acidosis in the setting of cardiac arrest # Hypertension, hyperlipidemia and peripheral vascular disease.  Neuro: Resume home gabapentin . CVS: Resume home Toprol  at lower dose at 100 mg daily, resume home Crestor  and resume home amlodipine .  Appreciate cardiology's input.  No further cardiac evaluation Lungs: Pain management for rib fractures and encourage incentive spirometer use GI: Advance diet as tolerated.  PPI continuation from home. Heme: Keep holding home Plavix  and aspirin . Renal: Avoid nephrotoxic agents.  Creatinine daily.  Overall plan is to monitor 24 more hours on telemetry would likely discharge tomorrow.   Critical care time: 45 minutes    Darrin Barn, MD [facility] 02/04/2024 11:56 AM

## 2024-02-04 NOTE — Consult Note (Signed)
 PHARMACY CONSULT NOTE - ELECTROLYTES  Pharmacy Consult for Electrolyte Monitoring and Replacement   Recent Labs: Potassium (mmol/L)  Date Value  02/04/2024 3.6  04/23/2014 4.3   Magnesium  (mg/dL)  Date Value  89/68/7974 2.0   Calcium  (mg/dL)  Date Value  89/68/7974 7.7 (L)   Calcium , Total (mg/dL)  Date Value  98/81/7983 7.9 (L)   Albumin (g/dL)  Date Value  89/69/7974 3.7  04/23/2014 3.5   Phosphorus (mg/dL)  Date Value  89/68/7974 4.0   Sodium (mmol/L)  Date Value  02/04/2024 140  04/23/2014 139   Corrected calcium : 7.9 mg/dL  Height: 6' (817.0 cm) Weight: 89.9 kg (198 lb 3.1 oz) IBW/kg (Calculated) : 77.6 Estimated Creatinine Clearance: 70 mL/min (by C-G formula based on SCr of 1.17 mg/dL).  Assessment  [name] [name] is a 63 y.o. male presenting with post cardiac arrest after suspected allergic reaction. PMH significant for COPD, T2DM, HTN, CAD, CKD, GERD and PVD . Pharmacy has been consulted to monitor and replace electrolytes.  Labs on admit notable for lactate >9, Co2 13 with elevated AG.  Diet: N/A MIVF: N/A Pertinent medications: N/A  Goal of Therapy: Electrolytes within normal limits  Plan:  No electrolyte replacement indicated at this time  Follow up BMP, Mg, and Phos with AM labs  Thank you for allowing pharmacy to be a part of this patient's care.  Bernardino George, PharmD Candidate [phone] [employer] 02/04/2024 8:00 AM

## 2024-02-04 NOTE — Consult Note (Addendum)
 [facility] CARDIOLOGY CONSULT NOTE       Patient ID: [name] [name] MRN: [id number] DOB/AGE: 10/23/1960 63 y.o.  Admit date: 02/03/2024 Referring Physician Lonell Moose, NP Primary Physician Lenon Layman ORN, MD  Primary Cardiologist None Reason for Consultation cardio-pulmonary arrest after insect bite  HPI: [name] [name] is a 63 y.o. male  with a past medical history of coronary artery disease by CT scan, hypertension, hyperlipidemia, peripheral vascular disease, type II diabetes, chronic kidney disease, COPD, GERD who presented to the ED on 02/03/2024 for out of hospital VF arrest with successful ROSC. Cardiology was consulted for further evaluation.   Patient was found unresponsive by his wife yesterday afternoon, 911 was called and he was found without pulses, received CPR from the fire department.  Regained pulses but then were lost again shortly after, CPR continued and i-gel airway placed and patient regained consciousness.  Workup in the ED notable for creatinine 1.33, potassium 3.2, magnesium  1.9, hemoglobin 16.7, WBC 11.3. Troponins 20 > 33 > 58 > 56, BNP 37. Lactic 9.0 > 3.8. EKG in the ED sinus tach rate 114 bpm. CTA chest revealed no PE but noted anteromediastinal hematoma up to 18 mm with acute rib fractures, because of this PCCM was not comfortable starting IV heparin  last night.  At the time of my evaluation this AM, he is sitting upright in hospital bed.  Patient is alert and oriented without any focal neurologic deficits.  He states that yesterday he was sitting outside ON his patio and noticed a bug flying around him for a few minutes.  He then felt a stinging sensation on the back of his neck and believes that what ever bug was flying around stung him.  States that 10 seconds after that occurred he felt flushed and then another 30 seconds later he felt like he was fading away.  Shortly after his wife found him unconscious and called 911 and he received CPR.   He denies any chest pain recently or around the time of this episode yesterday.  Also denies any shortness of breath or palpitation symptoms.  Review of systems complete and found to be negative unless listed above    Past Medical History:  Diagnosis Date   Anemia    pernicious anemia   Atherosclerosis of native artery of both lower extremities with intermittent claudication    Bilateral carotid artery stenosis    Cancer (HCC)    renal   Chest pain    Unspecified   Chronic kidney disease    Chronic kidney disease, stage III (moderate) (HCC)    Controlled type 2 diabetes mellitus with complication, without long-term current use of insulin (HCC)    COPD (chronic obstructive pulmonary disease) (HCC)    Coronary artery disease involving native coronary artery of native heart without angina pectoris    Dysrhythmia    pac's.  okay now.   GERD (gastroesophageal reflux disease)    History of kidney stones    Hypertension    Hypertriglyceridemia    Kidney stone on left side    2018   Palpitations    Peripheral arterial disease with history of revascularization    Peripheral vascular disease 2019   Pulmonary emphysema (HCC)    PVD (peripheral vascular disease)    Renal insufficiency    Renal mass    Thyroid nodule 2019   being monitored    Past Surgical History:  Procedure Laterality Date   CATARACT EXTRACTION [facility] Left 06/24/2023  Procedure: PHACOEMULSIFICATION, CATARACT, WITH IOL INSERTION 5.63, 00:48.0;  Surgeon: Enola Feliciano Hugger, MD;  Location: [facility];  Service: Ophthalmology;  Laterality: Left;   COLONOSCOPY N/A 01/23/2021   Procedure: COLONOSCOPY;  Surgeon: Jinny Carmine, MD;  Location: [facility] ENDOSCOPY;  Service: Endoscopy;  Laterality: N/A;   CYSTOSCOPY W/ RETROGRADES Bilateral 03/23/2017   Procedure: CYSTOSCOPY WITH RETROGRADE PYELOGRAM;  Surgeon: Twylla Glendia BROCKS, MD;  Location: [facility] ORS;  Service: Urology;  Laterality: Bilateral;   CYSTOSCOPY W/  URETERAL STENT PLACEMENT Left 03/31/2017   Procedure: CYSTOSCOPY WITH STENT REPLACEMENT;  Surgeon: Twylla Glendia BROCKS, MD;  Location: [facility] ORS;  Service: Urology;  Laterality: Left;   CYSTOSCOPY/RETROGRADE/URETEROSCOPY/STONE EXTRACTION WITH BASKET Left 03/31/2017   Procedure: CYSTOSCOPY/RETROGRADE/URETEROSCOPY/STONE EXTRACTION WITH BASKET;  Surgeon: Twylla Glendia BROCKS, MD;  Location: [facility];  Service: Urology;  Laterality: Left;   CYSTOSCOPY/URETEROSCOPY/HOLMIUM LASER/STENT PLACEMENT Left 03/23/2017   Procedure: CYSTOSCOPY/URETEROSCOPY/HOLMIUM LASER/STENT PLACEMENT;  Surgeon: Twylla Glendia BROCKS, MD;  Location: [facility];  Service: Urology;  Laterality: Left;   ESOPHAGOGASTRODUODENOSCOPY (EGD) WITH PROPOFOL  N/A 10/22/2015   Procedure: ESOPHAGOGASTRODUODENOSCOPY (EGD) WITH PROPOFOL ;  Surgeon: Carmine Jinny, MD;  Location: [facility] ENDOSCOPY;  Service: Endoscopy;  Laterality: N/A;   EXTRACORPOREAL SHOCK WAVE LITHOTRIPSY  2016   EXTRACORPOREAL SHOCK WAVE LITHOTRIPSY Left 09/13/2014   Procedure: EXTRACORPOREAL SHOCK WAVE LITHOTRIPSY (ESWL);  Surgeon: Rosina Riis, MD;  Location: [facility];  Service: Urology;  Laterality: Left;   HERNIA REPAIR     IR RADIOLOGIST EVAL & MGMT  08/01/2020   IR RADIOLOGIST EVAL & MGMT  09/17/2020   IR RADIOLOGIST EVAL & MGMT  01/21/2021   IR RADIOLOGIST EVAL & MGMT  02/03/2022   IR RADIOLOGIST EVAL & MGMT  10/13/2023   LOWER EXTREMITY ANGIOGRAPHY Left 07/15/2017   Procedure: LOWER EXTREMITY ANGIOGRAPHY;  Surgeon: Marea Selinda RAMAN, MD;  Location: [facility];  Service: Cardiovascular;  Laterality: Left;   lower extremity interventions x2 right leg Right    stent   LYSIS OF ADHESION N/A 05/09/2021   Procedure: LYSIS OF ADHESION;  Surgeon: Sheldon Standing, MD;  Location: [facility];  Service: General;  Laterality: N/A;   PERIPHERAL VASCULAR CATHETERIZATION Right 08/20/2014   Procedure: Lower Extremity Angiography;  Surgeon: Selinda RAMAN Marea, MD;  Location: [facility];  Service:  Cardiovascular;  Laterality: Right;   PERIPHERAL VASCULAR CATHETERIZATION Right 08/20/2014   Procedure: Lower Extremity Intervention;  Surgeon: Selinda RAMAN Marea, MD;  Location: [facility];  Service: Cardiovascular;  Laterality: Right;   RADIOLOGY WITH ANESTHESIA N/A 08/28/2020   Procedure: RADIOLOGY WITH ANESTHESIA CT WITH CRYOABLATION;  Surgeon: Vanice Sharper, MD;  Location: [facility];  Service: Anesthesiology;  Laterality: N/A;   VENTRAL HERNIA REPAIR  08/11/2019   Procedure: HERNIA REPAIR VENTRAL ADULT WITH REPAIR OF LARGE INTESTINE LACERATION;  Surgeon: Rodolph Romano, MD;  Location: [facility];  Service: General;;   VENTRAL HERNIA REPAIR N/A 05/09/2021   Procedure: LAPAROSCOPIC VENTRAL HERNIA REPAIR WITH MESH WITH TAP BLOCK;  Surgeon: Sheldon Standing, MD;  Location: [facility];  Service: General;  Laterality: N/A;   XI ROBOTIC ASSISTED VENTRAL HERNIA N/A 08/11/2019   Procedure: XI ROBOTIC ASSISTED VENTRAL HERNIA;  Surgeon: Rodolph Romano, MD;  Location: [facility];  Service: General;  Laterality: N/A;  Converted to open procedure    Medications Prior to Admission  Medication Sig Dispense Refill Last Dose/Taking   amLODipine  (NORVASC ) 10 MG tablet Take 10 mg by mouth daily.   02/03/2024 Morning   aspirin  EC 81 MG tablet Take  81 mg by mouth daily.   02/03/2024 Morning   ATROVENT HFA 17 MCG/ACT inhaler Inhale 2 puffs into the lungs every 6 (six) hours as needed.   02/03/2024 Morning   clopidogrel  (PLAVIX ) 75 MG tablet Take 75 mg by mouth daily.   02/03/2024 Morning   gabapentin  (NEURONTIN ) 300 MG capsule Take 1 capsule (300 mg total) by mouth 3 (three) times daily. 60 capsule 2 02/02/2024 Evening   metoprolol  (TOPROL -XL) 200 MG 24 hr tablet Take 200 mg by mouth daily.    02/03/2024 Morning   MOUNJARO 10 MG/0.5ML Pen Inject 10 mg into the skin once a week.   02/03/2024 Morning   Multiple Vitamin (MULTIVITAMIN WITH MINERALS) TABS tablet Take 1 tablet by mouth daily.   02/03/2024 Morning    omega-3 acid ethyl esters (LOVAZA ) 1 G capsule Take 4 g by mouth daily.    02/03/2024 Morning   pantoprazole  (PROTONIX ) 40 MG tablet Take 40 mg by mouth daily.   02/03/2024 Morning   rosuvastatin  (CRESTOR ) 40 MG tablet Take 40 mg by mouth daily.    02/03/2024 Morning   traMADol  (ULTRAM ) 50 MG tablet Take 50 mg by mouth every 6 (six) hours as needed. (Patient not taking: Reported on 02/03/2024)   Not Taking   traZODone (DESYREL) 50 MG tablet Take 50 mg by mouth at bedtime. (Patient not taking: Reported on 02/03/2024)   Not Taking   Social History   Socioeconomic History   Marital status: Married    Spouse name: Not on file   Number of children: Not on file   Years of education: Not on file   Highest education level: Not on file  Occupational History   Occupation: Full Time  Tobacco Use   Smoking status: Former    Current packs/day: 0.00    Average packs/day: 1 pack/day for 37.0 years (37.0 ttl pk-yrs)    Types: Cigarettes    Start date: 04/21/1976    Quit date: 04/21/2013    Years since quitting: 10.7   Smokeless tobacco: Never  Vaping Use   Vaping status: Never Used  Substance and Sexual Activity   Alcohol use: Yes    Alcohol/week: 14.0 standard drinks of alcohol    Types: 7 Glasses of wine, 7 Cans of beer per week    Comment: 2 drinks daily   Drug use: No   Sexual activity: Yes  Other Topics Concern   Not on file  Social History Narrative   Regular exercise: No   Social Drivers of Corporate Investment Banker Strain: Low Risk  (12/20/2023)   Received from [facility]   Overall Financial Resource Strain (CARDIA)    Difficulty of Paying Living Expenses: Not hard at all  Food Insecurity: No Food Insecurity (02/03/2024)   Hunger Vital Sign    Worried About Running Out of Food in the Last Year: Never true    Ran Out of Food in the Last Year: Never true  Transportation Needs: No Transportation Needs (02/03/2024)   PRAPARE - Scientist, Research (physical Sciences) (Medical): No    Lack of Transportation (Non-Medical): No  Physical Activity: Not on file  Stress: Not on file  Social Connections: Moderately Integrated (02/03/2024)   Social Connection and Isolation Panel    Frequency of Communication with Friends and Family: More than three times a week    Frequency of Social Gatherings with Friends and Family: More than three times a week    Attends Religious Services:  1 to 4 times per year    Active Member of Clubs or Organizations: No    Attends Banker Meetings: Never    Marital Status: Married  Catering Manager Violence: Not At Risk (02/03/2024)   Humiliation, Afraid, Rape, and Kick questionnaire    Fear of Current or Ex-Partner: No    Emotionally Abused: No    Physically Abused: No    Sexually Abused: No    Family History  Problem Relation Age of Onset   Heart failure Other      Vitals:   02/04/24 0400 02/04/24 0500 02/04/24 0600 02/04/24 0700  BP: 112/60 119/67 126/75 121/65  Pulse: 92 89 92 85  Resp: (!) 21 20 (!) 23 (!) 32  Temp: 98.3 F (36.8 C)     TempSrc: Oral     SpO2: 96% 95% 96% 94%  Weight:      Height:        PHYSICAL EXAM General: Well-appearing male, well nourished, in no acute distress. HEENT: Normocephalic and atraumatic. Neck: No JVD.  Lungs: Normal respiratory effort on 2L [place]. Clear bilaterally to auscultation. No wheezes, crackles, rhonchi.  Heart: HRRR. Normal S1 and S2 without gallops or murmurs.  Abdomen: Non-distended appearing.  Msk: Normal strength and tone for age. Extremities: Warm and well perfused. No clubbing, cyanosis.  No edema.  Neuro: Alert and oriented X 3. Psych: Answers questions appropriately.   Labs: Basic Metabolic Panel: Recent Labs    02/03/24 1546 02/03/24 1809 02/04/24 0557  NA 140  --  140  K 3.2*  --  3.6  CL 107  --  108  CO2 13*  --  22  GLUCOSE 227*  --  111*  BUN 20  --  22  CREATININE 1.33*  --  1.17  CALCIUM  8.3*  --  7.7*  MG  --   1.9 2.0  PHOS  --  3.9 4.0   Liver Function Tests: Recent Labs    02/03/24 1546  AST 38  ALT 34  ALKPHOS 57  BILITOT 0.8  PROT 7.4  ALBUMIN 3.7   No results for input(s): LIPASE, AMYLASE in the last 72 hours. CBC: Recent Labs    02/03/24 1546 02/04/24 0557  WBC 11.3* 11.4*  NEUTROABS 4.7  --   HGB 16.7 12.2*  HCT 51.7 36.4*  MCV 101.2* 97.8  PLT 191 122*   Cardiac Enzymes: Recent Labs    02/03/24 1809 02/03/24 2033 02/03/24 2234  TROPONINIHS 33* 58* 56*   BNP: Recent Labs    02/03/24 1546  BNP 37.7   D-Dimer: No results for input(s): DDIMER in the last 72 hours. Hemoglobin A1C: No results for input(s): HGBA1C in the last 72 hours. Fasting Lipid Panel: No results for input(s): CHOL, HDL, LDLCALC, TRIG, CHOLHDL, LDLDIRECT in the last 72 hours. Thyroid Function Tests: No results for input(s): TSH, T4TOTAL, T3FREE, THYROIDAB in the last 72 hours.  Invalid input(s): FREET3 Anemia Panel: No results for input(s): VITAMINB12, FOLATE, FERRITIN, TIBC, IRON, RETICCTPCT in the last 72 hours.   Radiology: ECHOCARDIOGRAM COMPLETE Result Date: 02/04/2024    ECHOCARDIOGRAM REPORT   Patient Name:   JOSPH NORFLEET Date of Exam: 02/04/2024 Medical Rec #:  [id number]           Height:       72.0 in Accession #:    [id number]          Weight:       198.2 lb Date of Birth:  12/20/1960           BSA:          2.122 m Patient Age:    62 years            BP:           121/65 mmHg Patient Gender: M                   HR:           85 bpm. Exam Location:  [facility] Procedure: 2D Echo, Cardiac Doppler, Color Doppler, Strain Analysis and 3D Echo            (Both Spectral and Color Flow Doppler were utilized during            procedure). Indications:     Cardiac arrest I46.9                  Mediastinal hematoma  History:         Patient has prior history of Echocardiogram examinations, most                  recent 08/04/2011. COPD; Risk  Factors:Hypertension.  Sonographer:     Christopher Furnace Referring Phys:  [id number] Toree Edling Diagnosing Phys: Marsa Dooms MD  Sonographer Comments: Global longitudinal strain was attempted. IMPRESSIONS  1. Left ventricular ejection fraction, by estimation, is 65 to 70%. The left ventricle has normal function. The left ventricle has no regional wall motion abnormalities. Left ventricular diastolic parameters are consistent with Grade I diastolic dysfunction (impaired relaxation). The global longitudinal strain is normal.  2. Right ventricular systolic function is normal. The right ventricular size is normal.  3. The mitral valve is normal in structure. Trivial mitral valve regurgitation. No evidence of mitral stenosis.  4. The aortic valve is normal in structure. Aortic valve regurgitation is not visualized. No aortic stenosis is present.  5. The inferior vena cava is normal in size with greater than 50% respiratory variability, suggesting right atrial pressure of 3 mmHg. FINDINGS  Left Ventricle: Left ventricular ejection fraction, by estimation, is 65 to 70%. The left ventricle has normal function. The left ventricle has no regional wall motion abnormalities. Strain was performed and the global longitudinal strain is normal. The  left ventricular internal cavity size was normal in size. There is no left ventricular hypertrophy. Left ventricular diastolic parameters are consistent with Grade I diastolic dysfunction (impaired relaxation). Right Ventricle: The right ventricular size is normal. No increase in right ventricular wall thickness. Right ventricular systolic function is normal. Left Atrium: Left atrial size was normal in size. Right Atrium: Right atrial size was normal in size. Pericardium: There is no evidence of pericardial effusion. Mitral Valve: The mitral valve is normal in structure. Trivial mitral valve regurgitation. No evidence of mitral valve stenosis. MV peak gradient, 2.4 mmHg. The mean  mitral valve gradient is 2.0 mmHg. Tricuspid Valve: The tricuspid valve is normal in structure. Tricuspid valve regurgitation is trivial. No evidence of tricuspid stenosis. Aortic Valve: The aortic valve is normal in structure. Aortic valve regurgitation is not visualized. No aortic stenosis is present. Aortic valve mean gradient measures 4.0 mmHg. Aortic valve peak gradient measures 6.6 mmHg. Aortic valve area, by VTI measures 2.81 cm. Pulmonic Valve: The pulmonic valve was normal in structure. Pulmonic valve regurgitation is not visualized. No evidence of pulmonic stenosis. Aorta: The aortic root is normal in size and structure. Venous: The inferior vena cava is normal  in size with greater than 50% respiratory variability, suggesting right atrial pressure of 3 mmHg. IAS/Shunts: No atrial level shunt detected by color flow Doppler. Additional Comments: 3D was performed not requiring image post processing on an independent workstation and was normal.  LEFT VENTRICLE PLAX 2D LVIDd:         5.00 cm   Diastology LVIDs:         3.10 cm   LV e' medial:    8.05 cm/s LV PW:         1.10 cm   LV E/e' medial:  6.4 LV IVS:        1.20 cm   LV e' lateral:   9.68 cm/s LVOT diam:     2.30 cm   LV E/e' lateral: 5.3 LV SV:         63 LV SV Index:   30 LVOT Area:     4.15 cm                           3D Volume EF:                          3D EF:        52 %                          LV EDV:       286 ml                          LV ESV:       136 ml                          LV SV:        150 ml RIGHT VENTRICLE RV Basal diam:  4.00 cm RV Mid diam:    3.10 cm TAPSE (M-mode): 2.3 cm LEFT ATRIUM             Index        RIGHT ATRIUM           Index LA diam:        2.50 cm 1.18 cm/m   RA Area:     20.70 cm LA Vol (A2C):   27.1 ml 12.77 ml/m  RA Volume:   61.00 ml  28.74 ml/m LA Vol (A4C):   31.3 ml 14.75 ml/m LA Biplane Vol: 31.1 ml 14.65 ml/m  AORTIC VALVE AV Area (Vmax):    2.74 cm AV Area (Vmean):   2.64 cm AV Area (VTI):      2.81 cm AV Vmax:           128.00 cm/s AV Vmean:          88.200 cm/s AV VTI:            0.223 m AV Peak Grad:      6.6 mmHg AV Mean Grad:      4.0 mmHg LVOT Vmax:         84.40 cm/s LVOT Vmean:        56.100 cm/s LVOT VTI:          0.151 m LVOT/AV VTI ratio: 0.68  AORTA Ao Root diam: 3.20 cm MITRAL VALVE               TRICUSPID VALVE MV  Area (PHT): 4.36 cm    TR Peak grad:   9.9 mmHg MV Area VTI:   3.32 cm    TR Vmax:        157.00 cm/s MV Peak grad:  2.4 mmHg MV Mean grad:  2.0 mmHg    SHUNTS MV Vmax:       0.78 m/s    Systemic VTI:  0.15 m MV Vmean:      58.9 cm/s   Systemic Diam: 2.30 cm MV Decel Time: 174 msec MV E velocity: 51.40 cm/s MV A velocity: 76.50 cm/s MV E/A ratio:  0.67 Marsa Dooms MD Electronically signed by Marsa Dooms MD Signature Date/Time: 02/04/2024/8:33:44 AM    Final    CT Angio Chest PE W and/or Wo Contrast Result Date: 02/03/2024 EXAM: CTA of the Chest with contrast for PE 02/03/2024 05:57:39 PM TECHNIQUE: CTA of the chest was performed after the administration of intravenous contrast. Multiplanar reformatted images are provided for review. MIP images are provided for review. Automated exposure control, iterative reconstruction, and/or weight based adjustment of the mA/kV was utilized to reduce the radiation dose to as low as reasonably achievable. 75 mL (iohexol  (OMNIPAQUE ) 350 MG/ML injection 75 mL IOHEXOL  350 MG/ML SOLN) was administered. COMPARISON: CT chest 492000 and 25. CLINICAL HISTORY: Pulmonary embolism (PE) suspected, high prob. FINDINGS: PULMONARY ARTERIES: Pulmonary arteries are adequately opacified for evaluation. No pulmonary embolism. Main pulmonary artery is normal in caliber. MEDIASTINUM: There is an anteromediastinal hematoma at the level of the heart measuring up to 18 mm. There is no acute abnormality of the thoracic aorta. LYMPH NODES: No mediastinal, hilar or axillary lymphadenopathy. LUNGS AND PLEURA: Particular opacities and ground glass  opacities are seen in the bilateral lung bases and lingula. There is bilateral lower lobe peribronchial wall thickening. There is a new 4 mm nodule in the right middle lobe. No pleural effusion or pneumothorax. UPPER ABDOMEN: Limited images of the upper abdomen demonstrate the colon is mildly enlarged. There is a small hiatal hernia. There are bilateral renal cysts. SOFT TISSUES AND BONES: There is an anteromediastinal hematoma at the level of the heart measuring up to 18 mm. Chronic T12 compression fracture is unchanged. There is a nondisplaced anterior right fifth rib fracture. There is a nondisplaced anterior left third rib fracture. There are nondisplaced posterolateral left ninth rib fracture. There is a displaced posterior left tenth rib fracture. IMPRESSION: 1. No evidence of pulmonary embolism. 2. Anteromediastinal hematoma at the level of the heart measuring up to 18 mm. 3. Acute bilateral rib fractures. 4. Bilateral lower lobe peribronchial wall thickening and bibasilar/lingular atelectasis or inflammation, correlate clinically. 5. New 4 mm solid pulmonary nodule in the right middle lobe. Fleischner Society Guidelines apply (age 33 assumed; no known cancer or immunocompromise). For a single 05 mm solid nodule without stated high-risk features: no routine follow-up imaging is recommended. If the patient is high risk or the nodule has high-risk features (suspicious morphology or upper lobe), optional non-contrast chest CT at 12 months; if stable, no further follow-up. Electronically signed by: Greig Pique MD 02/03/2024 06:34 PM EDT RP Workstation: [id number]   CT Head Wo Contrast Result Date: 02/03/2024 EXAM: CT HEAD WITHOUT CONTRAST 02/03/2024 05:57:26 PM TECHNIQUE: CT of the head was performed without the administration of intravenous contrast. Automated exposure control, iterative reconstruction, and/or weight based adjustment of the mA/kV was utilized to reduce the radiation dose to as low as  reasonably achievable. COMPARISON: 03/18/2016 CLINICAL HISTORY: Mental status change, unknown cause. FINDINGS:  BRAIN AND VENTRICLES: No acute hemorrhage. No evidence of acute infarct. No hydrocephalus. No extra-axial collection. No mass effect or midline shift. Cavum septum pellucidum et vergae. ORBITS: Left lens replacement. SINUSES: No acute abnormality. SOFT TISSUES AND SKULL: No acute soft tissue abnormality. No skull fracture. IMPRESSION: 1. No acute intracranial abnormality. Electronically signed by: Franky Stanford MD 02/03/2024 06:16 PM EDT RP Workstation: [id number]   DG Chest Port 1 View Result Date: 02/03/2024 EXAM: 1 VIEW(S) XRAY OF THE CHEST 02/03/2024 04:10:33 PM COMPARISON: 08/28/2020 CLINICAL HISTORY: Respiratory distress. FINDINGS: LUNGS AND PLEURA: No focal pulmonary opacity. No pulmonary edema. No pleural effusion. No pneumothorax. HEART AND MEDIASTINUM: No acute abnormality of the cardiac and mediastinal silhouettes. BONES AND SOFT TISSUES: No acute osseous abnormality. IMPRESSION: 1. No acute cardiopulmonary process. Electronically signed by: Lynwood Seip MD 02/03/2024 04:15 PM EDT RP Workstation: [id number]    ECHO as above  TELEMETRY (personally reviewed): Sinus rhythm rate 80s  EKG (personally reviewed): sinus tach rate 114 bpm  Data reviewed by me 02/04/2024: last 24h vitals tele labs imaging I/O ED provider note, admission H&P  Principal Problem:   Cardiac arrest [facility])    ASSESSMENT AND PLAN:  DEANTE BLOUGH is a 63 y.o. male  with a past medical history of coronary artery disease by CT scan, hypertension, hyperlipidemia, peripheral vascular disease, type II diabetes, chronic kidney disease, COPD, GERD who presented to the ED on 02/03/2024 for out of hospital VF arrest with successful ROSC. Cardiology was consulted for further evaluation.   # Cardiopulmonary arrest 2/2 suspected insect bite/allergic reaction # Anteromediastinal hematoma 2/2 CPR # Demand ischemia #  Hypertension # Hyperlipidemia # Peripheral vascular disease Patient found unresponsive yesterday by his wife, fire department unable to find pulses so he received 2 rounds of CPR with ROSC.  Troponins mildly and trending 20 > 33 > 58 > 56.  BNP normal.  Lactic acid initially greater than 9.0 trended down to 3.8.  EKG with sinus rhythm, no acute ischemic changes.  Echo this morning with EF 65-70%, no wall motion abnormalities, grade 1 diastolic dysfunction, normal RV size and function, trivial MR. - Resume home rosuvastatin  40 mg daily.  Currently aspirin  and Plavix  are being held given his anteromediastinal hematoma. - Consider resuming home metoprolol  succinate and amlodipine  if BP remains stable. -Minimally elevated and flat troponin most consistent with demand/supply mismatch and not ACS.  -No plan for further cardiac diagnostics.  This patient's plan of care was discussed and created with Dr. Ammon and he is in agreement.  Signed: Danita Bloch, PA-C  02/04/2024, 8:41 AM [facility]

## 2024-02-04 NOTE — Progress Notes (Signed)
 Patient admitted to PCU from ICU. Upon arrival, patient placed on continuous cardiac telemetry monitoring. Vital signs obtained and documented. Patient oriented to room, call bell system, and unit routines. Safety measures implemented: bed in lowest position, call bell in reach, and side rails up x3. Patient repositioned for comfort and skin protection. Initial head-to-toes assessment completed. Patient resting comfortably in bed, no acute distress noted at this time.

## 2024-02-04 NOTE — Progress Notes (Signed)
*  PRELIMINARY RESULTS* Echocardiogram 2D Echocardiogram has been performed.  [name] [name] 02/04/2024, 8:00 AM

## 2024-02-04 NOTE — Progress Notes (Signed)
 Patient requesting to be discharged today. Education provided to patient. RN offered to help patient ambulate the unit and around room. Patient declined.

## 2024-02-05 ENCOUNTER — Inpatient Hospital Stay

## 2024-02-05 ENCOUNTER — Telehealth: Payer: Self-pay | Admitting: Pulmonary Disease

## 2024-02-05 DIAGNOSIS — T782XXA Anaphylactic shock, unspecified, initial encounter: Secondary | ICD-10-CM

## 2024-02-05 DIAGNOSIS — I469 Cardiac arrest, cause unspecified: Secondary | ICD-10-CM | POA: Diagnosis not present

## 2024-02-05 LAB — CBC
HCT: 36.1 % — ABNORMAL LOW (ref 39.0–52.0)
Hemoglobin: 12 g/dL — ABNORMAL LOW (ref 13.0–17.0)
MCH: 32.3 pg (ref 26.0–34.0)
MCHC: 33.2 g/dL (ref 30.0–36.0)
MCV: 97.3 fL (ref 80.0–100.0)
Platelets: 107 K/uL — ABNORMAL LOW (ref 150–400)
RBC: 3.71 MIL/uL — ABNORMAL LOW (ref 4.22–5.81)
RDW: 12.8 % (ref 11.5–15.5)
WBC: 9.1 K/uL (ref 4.0–10.5)
nRBC: 0 % (ref 0.0–0.2)

## 2024-02-05 LAB — MAGNESIUM: Magnesium: 2.2 mg/dL (ref 1.7–2.4)

## 2024-02-05 LAB — PHOSPHORUS: Phosphorus: 2.6 mg/dL (ref 2.5–4.6)

## 2024-02-05 LAB — BASIC METABOLIC PANEL WITH GFR
Anion gap: 6 (ref 5–15)
BUN: 11 mg/dL (ref 8–23)
CO2: 23 mmol/L (ref 22–32)
Calcium: 8.1 mg/dL — ABNORMAL LOW (ref 8.9–10.3)
Chloride: 107 mmol/L (ref 98–111)
Creatinine, Ser: 0.63 mg/dL (ref 0.61–1.24)
GFR, Estimated: >60 mL/min (ref >60)
Glucose, Bld: 113 mg/dL — ABNORMAL HIGH (ref 70–99)
Potassium: 3.6 mmol/L (ref 3.5–5.1)
Sodium: 136 mmol/L (ref 135–145)

## 2024-02-05 MED ORDER — EPINEPHRINE 0.3 MG/0.3ML IJ SOAJ: 0.3000 mg | Freq: Once | INTRAMUSCULAR | 0 refills | Status: AC

## 2024-02-05 NOTE — Plan of Care (Signed)

## 2024-02-05 NOTE — Discharge Summary (Signed)
 NAME:  KEYLON LABELLE, MRN:  [id number], DOB:  [birth year], LOS: 2 ADMISSION DATE:  02/03/2024  History of Present Illness:  Mr. Pasquale Matters is a 63 y.o. male with a past medical history significant for COPD, Type II Diabetes, Hypertension, CAD, CKD, GERD and PVD who presented to [facility] ED via EMS following an episode of loss of consciousness and loss of pulses. Per his wife, he had been sitting outside on the porch for approximately 30 minutes when she returned home. At the time of her return, the patient began to complain of being stung by something on the back of his neck, likely a bee, as he had seen one flying around him at that time. His wife checked his neck and did not see any indication of a sting at that time. She proceeded to bring her belongings inside of their home and when she returned to the porch, the patient was leaned over unconscious. She called EMS and per their reports, when the Fire Department arrived, the patient had no pulse. CPR was subsequently began with ROSC but shortly after lost pulses again, then to have ROSC a second time. Upon paramedic arrival, the patient appeared dusky and cyanotic with agonal respirations. An I-gel airway was placed and suctioning completed which resulted in him becoming more awake and alert. At that time he became more agitated and removed the I-gel. He received 2.1mg  of Versed  for agitation and has been breathing spontaneously since. The patient denies any previous episodes similar to this one.    ED Course Initial Vitals: Temp: 96.5, HR: 114, BP 98/57, RR 25, SpO2 96% on RA. Upon arrival to the ED, the patient was awake and talking when seen by the EDP. He remained on room air without signs of respiratory distress. CXR did not show signs of any acute cardiopulmonary process. He was taken to imaging for a CT Head and CTA Chest PE.    Medications Administered LR bolus x 2   Pertinent Labs/Diagnostics VBG: ph 7.06, pCO2 50, pO2 53,  Bicabr 14.2 Anion Gap: 20 Na: 140 K: 3.2 CO2: 13 Cr: 1.33 Ethanol: 29 Lactic Acid: >9 RVP (Flu/Covid/RSV): negative Troponin: 20 BNP: 37.7 CBG: 216 WBC: 11.3 EKG: sinus tachycardia CXR: no acute cardiopulmonary process CT Head wo Contrast: No acute intracranial abnormality CTA Chest PE: no evidence of pulmonary embolism; anteromediastinal hematoma, acute bilateral rib fractures, bilateral lower lobe peribronchial wall thickening and bibasilar/lingular atelectasis or inflammation; new 4mm solid pulmonary nodule in right middle lobe. Blood cultures x 2 drawn     PCCM consulted for evaluation and admission for hypotension in the setting of post-cardiac arrest, AGMA, lactic acidosis and acute kidney injury.  Pertinent  Medical History  COPD Hypertension CAD Type II Diabetes Mellitus CKD GERD PVD   Micro Data 10/30 Blood cultures x 2 >> 10/30 RVP (Flu/covid/rsv) >> negative  Significant Hospital Events: Including procedures, antibiotic start and stop dates in addition to other pertinent events   10/30: Arrived via EMS following cardiac arrest with ROSC x 2. Agitated upon arrival, received 2.1mg  versed  and has been resting in NAD, breathing spontaneously. VBG: ph 7.06, pCO2 50, pO2 53, Bicarb 14.2. Lactic acidosis (>9) with AKI (Cr. 1.33).  11/1 Discharge home with cardiology follow up  Interim History / Subjective:  -- Patient awake alert and oriented. Has no major complaints besides chest soreness from chest compressions.  -- Echocardiogram with normal EF 65 to 70%, no RWMA. Grade 1 diastolic dysfunction. RV size is normal.  --  Troponins on mildly increased but flat at 56  -- Lactic acid downtrended from 9 to 3.8.  -- No arrhythmias on Tele for 48 hours.  -- CXR this AM appears normal.   Objective    Blood pressure 127/78, pulse 98, temperature 98.2 F (36.8 C), temperature source Oral, resp. rate 19, height 6' (1.829 m), weight 88.8 kg, SpO2 98%.         Intake/Output Summary (Last 24 hours) at 02/05/2024 1105 Last data filed at 02/04/2024 2041 Gross per 24 hour  Intake --  Output 300 ml  Net -300 ml   Filed Weights   02/03/24 1541 02/03/24 1921 02/05/24 0500  Weight: 95 kg 89.9 kg 88.8 kg    Examination: General: No acute distress, awake alert and oriented.  HENT: Supple neck, reactive pupils, EOMI Lungs: Clear bilateral air entry  Cardiovascular: Normal S1, Normal S2, RRR Abdomen: Soft, non tender, non distended, + BS  Extremities: Warm and well perfused no edema.   Labs and imaging were reviewed.   Assessment and Plan  Case of a 63 year old male patient with a past medical history of COPD, type 2 diabetes mellitus, hypertension, PVD, neuropathic pain and hyperlipidemia presented yesterday to [facility] after an out-of-hospital nonshockable cardiac arrest.  # Not shockable out-of-hospital cardiac arrest secondary to possible vasovagal reaction in the setting of an insect bite complicated by # Anterior mediastinal hematoma due to CPR as well as acute rib fractures # Type II NSTEMI secondary to demand ischemia # Lactic acidosis in the setting of cardiac arrest # Hypertension, hyperlipidemia and peripheral vascular disease.  Neuro: Resume home gabapentin . CVS: Resume home Toprol  at lower dose at 100 mg daily, resume home Crestor  and resume home amlodipine .  Appreciate cardiology's input.  No further cardiac evaluation Lungs: Pain management for rib fractures and encourage incentive spirometer use GI: Advance diet as tolerated.  PPI continuation from home. Heme: Keep holding home Plavix  and aspirin . Renal: Avoid nephrotoxic agents.  Creatinine daily.  Ready for discharge.    Critical care time: 45 minutes    Darrin Barn, MD [facility] 02/05/2024 11:05 AM

## 2024-02-05 NOTE — Telephone Encounter (Signed)
 Prescribing Epipen 

## 2024-02-05 NOTE — Progress Notes (Signed)
 SUBJECTIVE: Patient is feeling much better denies any chest pain.   Vitals:   02/05/24 0429 02/05/24 0500 02/05/24 0829 02/05/24 0830  BP: 116/68  127/78 127/78  Pulse: 92  90 98  Resp: 18  19   Temp: 97.9 F (36.6 C)  98.2 F (36.8 C)   TempSrc:   Oral   SpO2: 93%  95% 98%  Weight:  88.8 kg    Height:        Intake/Output Summary (Last 24 hours) at 02/05/2024 0924 Last data filed at 02/04/2024 2041 Gross per 24 hour  Intake --  Output 300 ml  Net -300 ml    LABS: Basic Metabolic Panel: Recent Labs    02/04/24 0557 02/05/24 0539  NA 140 136  K 3.6 3.6  CL 108 107  CO2 22 23  GLUCOSE 111* 113*  BUN 22 11  CREATININE 1.17 0.63  CALCIUM  7.7* 8.1*  MG 2.0 2.2  PHOS 4.0 2.6   Liver Function Tests: Recent Labs    02/03/24 1546  AST 38  ALT 34  ALKPHOS 57  BILITOT 0.8  PROT 7.4  ALBUMIN 3.7   No results for input(s): LIPASE, AMYLASE in the last 72 hours. CBC: Recent Labs    02/03/24 1546 02/04/24 0557 02/05/24 0539  WBC 11.3* 11.4* 9.1  NEUTROABS 4.7  --   --   HGB 16.7 12.2* 12.0*  HCT 51.7 36.4* 36.1*  MCV 101.2* 97.8 97.3  PLT 191 122* 107*   Cardiac Enzymes: No results for input(s): CKTOTAL, CKMB, CKMBINDEX, TROPONINI in the last 72 hours. BNP: Invalid input(s): POCBNP D-Dimer: No results for input(s): DDIMER in the last 72 hours. Hemoglobin A1C: No results for input(s): HGBA1C in the last 72 hours. Fasting Lipid Panel: No results for input(s): CHOL, HDL, LDLCALC, TRIG, CHOLHDL, LDLDIRECT in the last 72 hours. Thyroid Function Tests: No results for input(s): TSH, T4TOTAL, T3FREE, THYROIDAB in the last 72 hours.  Invalid input(s): FREET3 Anemia Panel: No results for input(s): VITAMINB12, FOLATE, FERRITIN, TIBC, IRON, RETICCTPCT in the last 72 hours.   PHYSICAL EXAM General: Well developed, well nourished, in no acute distress HEENT:  Normocephalic and atramatic Neck:  No JVD.   Lungs: Clear bilaterally to auscultation and percussion. Heart: HRRR . Normal S1 and S2 without gallops or murmurs.  Abdomen: Bowel sounds are positive, abdomen soft and non-tender  Msk:  Back normal, normal gait. Normal strength and tone for age. Extremities: No clubbing, cyanosis or edema.   Neuro: Alert and oriented X 3. Psych:  Good affect, responds appropriately  TELEMETRY: Normal sinus rhythm  ASSESSMENT AND PLAN: Status post cardiac arrest due to ventricular fibrillation with successful ROSC.  It is presumed to be suspected to be due to insect bite and allergic reaction.  Patient can go home and follow-up with cardiology in 2 weeks.  Can resume his home medication rosuvastatin  40 mg and aspirin  and Plavix  which was held.  He has concern of antral mediastinal hematoma due to cardiac arrest and chest compression.  Explained to him that it should resolve in few weeks.  Aspirin  and Plavix  can be restarted in few days.  Follow-up with [facility] cardiology show 1 to 2 weeks.   ICD-10-CM   1. Acute respiratory failure, unspecified whether with hypoxia or hypercapnia (HCC)  J96.00     2. Cardiac arrest [facility])  I46.9       Principal Problem:   Cardiac arrest [facility]    Denyse Bathe, MD, [facility] 02/05/2024 9:24 AM

## 2024-02-08 LAB — CULTURE, BLOOD (ROUTINE X 2)
Culture: NO GROWTH
Culture: NO GROWTH
Special Requests: ADEQUATE

## 2024-12-05 ENCOUNTER — Encounter (INDEPENDENT_AMBULATORY_CARE_PROVIDER_SITE_OTHER)

## 2024-12-05 ENCOUNTER — Ambulatory Visit (INDEPENDENT_AMBULATORY_CARE_PROVIDER_SITE_OTHER): Admitting: Vascular Surgery
